# Patient Record
Sex: Female | Born: 1945 | Race: Black or African American | Hispanic: No | Marital: Married | Smoking: Current every day smoker
Health system: Southern US, Community
[De-identification: ages and names within clinical notes are randomized; demographics above are authoritative.]

## PROBLEM LIST (undated history)

## (undated) DIAGNOSIS — I10 Essential (primary) hypertension: Secondary | ICD-10-CM

## (undated) DIAGNOSIS — I639 Cerebral infarction, unspecified: Secondary | ICD-10-CM

## (undated) DIAGNOSIS — K922 Gastrointestinal hemorrhage, unspecified: Secondary | ICD-10-CM

## (undated) DIAGNOSIS — R4701 Aphasia: Secondary | ICD-10-CM

## (undated) DIAGNOSIS — R569 Unspecified convulsions: Secondary | ICD-10-CM

## (undated) DIAGNOSIS — E079 Disorder of thyroid, unspecified: Secondary | ICD-10-CM

## (undated) HISTORY — PX: ABDOMINAL HYSTERECTOMY: SHX81

## (undated) HISTORY — PX: PEG TUBE REMOVAL: SHX2187

## (undated) HISTORY — PX: PEG TUBE PLACEMENT: SUR1034

---

## 2000-11-05 ENCOUNTER — Inpatient Hospital Stay (HOSPITAL_COMMUNITY): Admission: EM | Admit: 2000-11-05 | Discharge: 2000-11-12 | Payer: Self-pay | Admitting: Emergency Medicine

## 2000-11-12 ENCOUNTER — Inpatient Hospital Stay (HOSPITAL_COMMUNITY)
Admission: RE | Admit: 2000-11-12 | Discharge: 2000-11-21 | Payer: Self-pay | Admitting: Physical Medicine & Rehabilitation

## 2000-11-23 ENCOUNTER — Encounter (HOSPITAL_COMMUNITY)
Admission: RE | Admit: 2000-11-23 | Discharge: 2000-12-23 | Payer: Self-pay | Admitting: Physical Medicine & Rehabilitation

## 2001-01-18 ENCOUNTER — Encounter: Admission: RE | Admit: 2001-01-18 | Discharge: 2001-01-18 | Payer: Self-pay | Admitting: Neurosurgery

## 2001-09-20 ENCOUNTER — Encounter: Payer: Self-pay | Admitting: Family Medicine

## 2001-09-20 ENCOUNTER — Ambulatory Visit (HOSPITAL_COMMUNITY): Admission: RE | Admit: 2001-09-20 | Discharge: 2001-09-20 | Payer: Self-pay | Admitting: Family Medicine

## 2001-10-15 ENCOUNTER — Emergency Department (HOSPITAL_COMMUNITY): Admission: EM | Admit: 2001-10-15 | Discharge: 2001-10-15 | Payer: Self-pay | Admitting: Emergency Medicine

## 2001-11-30 ENCOUNTER — Encounter: Payer: Self-pay | Admitting: Internal Medicine

## 2001-11-30 ENCOUNTER — Inpatient Hospital Stay (HOSPITAL_COMMUNITY): Admission: EM | Admit: 2001-11-30 | Discharge: 2001-12-06 | Payer: Self-pay | Admitting: Internal Medicine

## 2001-12-30 ENCOUNTER — Emergency Department (HOSPITAL_COMMUNITY): Admission: EM | Admit: 2001-12-30 | Discharge: 2001-12-30 | Payer: Self-pay | Admitting: Emergency Medicine

## 2002-04-09 ENCOUNTER — Emergency Department (HOSPITAL_COMMUNITY): Admission: EM | Admit: 2002-04-09 | Discharge: 2002-04-09 | Payer: Self-pay | Admitting: Emergency Medicine

## 2002-12-07 ENCOUNTER — Encounter: Payer: Self-pay | Admitting: Emergency Medicine

## 2002-12-07 ENCOUNTER — Emergency Department (HOSPITAL_COMMUNITY): Admission: EM | Admit: 2002-12-07 | Discharge: 2002-12-07 | Payer: Self-pay | Admitting: Emergency Medicine

## 2003-06-18 ENCOUNTER — Inpatient Hospital Stay (HOSPITAL_COMMUNITY): Admission: EM | Admit: 2003-06-18 | Discharge: 2003-06-20 | Payer: Self-pay | Admitting: *Deleted

## 2003-07-21 ENCOUNTER — Emergency Department (HOSPITAL_COMMUNITY): Admission: EM | Admit: 2003-07-21 | Discharge: 2003-07-22 | Payer: Self-pay | Admitting: Internal Medicine

## 2003-08-18 ENCOUNTER — Emergency Department (HOSPITAL_COMMUNITY): Admission: EM | Admit: 2003-08-18 | Discharge: 2003-08-19 | Payer: Self-pay | Admitting: *Deleted

## 2004-01-12 ENCOUNTER — Emergency Department (HOSPITAL_COMMUNITY): Admission: EM | Admit: 2004-01-12 | Discharge: 2004-01-12 | Payer: Self-pay | Admitting: Emergency Medicine

## 2004-03-04 ENCOUNTER — Emergency Department (HOSPITAL_COMMUNITY): Admission: EM | Admit: 2004-03-04 | Discharge: 2004-03-04 | Payer: Self-pay | Admitting: Emergency Medicine

## 2004-03-04 ENCOUNTER — Inpatient Hospital Stay (HOSPITAL_COMMUNITY): Admission: AD | Admit: 2004-03-04 | Discharge: 2004-03-21 | Payer: Self-pay | Admitting: Neurological Surgery

## 2004-03-25 ENCOUNTER — Inpatient Hospital Stay: Admission: AD | Admit: 2004-03-25 | Discharge: 2004-05-07 | Payer: Self-pay | Admitting: Internal Medicine

## 2004-03-27 ENCOUNTER — Emergency Department (HOSPITAL_COMMUNITY): Admission: EM | Admit: 2004-03-27 | Discharge: 2004-03-27 | Payer: Self-pay | Admitting: Emergency Medicine

## 2004-04-06 ENCOUNTER — Ambulatory Visit (HOSPITAL_COMMUNITY): Admission: RE | Admit: 2004-04-06 | Discharge: 2004-04-06 | Payer: Self-pay | Admitting: Internal Medicine

## 2004-04-21 ENCOUNTER — Ambulatory Visit (HOSPITAL_COMMUNITY): Admission: RE | Admit: 2004-04-21 | Discharge: 2004-04-21 | Payer: Self-pay | Admitting: Internal Medicine

## 2004-04-27 ENCOUNTER — Encounter (HOSPITAL_COMMUNITY): Admission: RE | Admit: 2004-04-27 | Discharge: 2004-05-06 | Payer: Self-pay | Admitting: Orthopedic Surgery

## 2004-04-29 ENCOUNTER — Ambulatory Visit (HOSPITAL_COMMUNITY): Admission: RE | Admit: 2004-04-29 | Discharge: 2004-04-29 | Payer: Self-pay | Admitting: Orthopedic Surgery

## 2004-05-07 ENCOUNTER — Inpatient Hospital Stay: Admission: AD | Admit: 2004-05-07 | Discharge: 2005-03-06 | Payer: Self-pay | Admitting: Internal Medicine

## 2004-05-10 ENCOUNTER — Ambulatory Visit (HOSPITAL_COMMUNITY): Admission: RE | Admit: 2004-05-10 | Discharge: 2004-05-10 | Payer: Self-pay | Admitting: Orthopedic Surgery

## 2004-05-18 ENCOUNTER — Ambulatory Visit (HOSPITAL_COMMUNITY): Admission: RE | Admit: 2004-05-18 | Discharge: 2004-05-18 | Payer: Self-pay | Admitting: Internal Medicine

## 2004-06-14 ENCOUNTER — Ambulatory Visit (HOSPITAL_COMMUNITY): Admission: RE | Admit: 2004-06-14 | Discharge: 2004-06-14 | Payer: Self-pay | Admitting: Internal Medicine

## 2004-06-16 ENCOUNTER — Ambulatory Visit: Payer: Self-pay | Admitting: Internal Medicine

## 2004-06-16 ENCOUNTER — Ambulatory Visit (HOSPITAL_COMMUNITY): Admission: RE | Admit: 2004-06-16 | Discharge: 2004-06-16 | Payer: Self-pay | Admitting: Internal Medicine

## 2005-09-11 ENCOUNTER — Ambulatory Visit (HOSPITAL_COMMUNITY): Admission: RE | Admit: 2005-09-11 | Discharge: 2005-09-11 | Payer: Self-pay | Admitting: Family Medicine

## 2006-09-12 ENCOUNTER — Ambulatory Visit (HOSPITAL_COMMUNITY): Admission: RE | Admit: 2006-09-12 | Discharge: 2006-09-12 | Payer: Self-pay | Admitting: Family Medicine

## 2006-09-24 ENCOUNTER — Emergency Department (HOSPITAL_COMMUNITY): Admission: EM | Admit: 2006-09-24 | Discharge: 2006-09-25 | Payer: Self-pay | Admitting: Emergency Medicine

## 2007-09-27 ENCOUNTER — Ambulatory Visit (HOSPITAL_COMMUNITY): Admission: RE | Admit: 2007-09-27 | Discharge: 2007-09-27 | Payer: Self-pay | Admitting: Family Medicine

## 2010-12-23 NOTE — Group Therapy Note (Signed)
NAMEKEGAN, [name]              ACCOUNT NO.:  [id number]   MEDICAL RECORD NO.:  [id number]          PATIENT TYPE:  ORB   LOCATION:  S101                          FACILITY:  [facility]   PHYSICIAN:  Hanley Hays. Dechurch, M.D.DATE OF BIRTH:  Jun 11, 1946   DATE OF PROCEDURE:  DATE OF DISCHARGE:  06/16/2004                                   PROGRESS NOTE   HISTORY OF PRESENT ILLNESS:  [name] overall clinically is doing well.  She  is up going about the facility.  She is dressed in street clothes.  She is  cooperative and overall making good progress.  She still has occasional  confusion and needs direction as far as her ADLs.  She still does not have  much in the way of safety awareness.  Apparently the family is considering  discharge to either an apartment setting or living with a family member.  It  is clear that this patient can not live independently.  She needs 24/7  assistance given her multiple medical issues.  I doubt she could be  compliant with the current regimen successfully unless supervised.  Apparently there are some social issues regarding the patient's husband and  alleged substance abuse and certainly this is a concern as well.  Unless  full time care was available for this patient, she is unlikely to be  successful in the home setting.      FED/MEDQ  D:  08/24/2004  T:  08/24/2004  Job:  [id number]

## 2010-12-23 NOTE — Group Therapy Note (Signed)
   [name], [name] NO.:  [id number]   MEDICAL RECORD NO.:  [id number]                  PATIENT TYPE:   LOCATION:                                       FACILITY:   PHYSICIAN:  Mila Homer. Sudie Bailey, M.D.           DATE OF BIRTH:   DATE OF PROCEDURE:  06/18/2003  DATE OF DISCHARGE:                                   PROGRESS NOTE   SUBJECTIVE:  The patient was admitted to the hospital yesterday, postictal  after partial complex seizures.  She was started on phenobarbital in an  attempt to control the seizures and also allow her to sleep last night, but  long term with attempt to have on a seizure medicine she could actually  afford. She was started on potassium for hyperkalemia.   Today, as I asked her about symptoms she really is unable to respond in any  meaningful way.   OBJECTIVE:  Temperature 98.2, pulse 94, respiratory rate 20, blood pressure  160/95.  She is confused.  She does not know she is in the hospital, but she  does not know where she is as far as town or state.  She does not know the  date.  She is wandering around the room pulling at her Foley.  Heart has a  regular rhythm with a rate of 70.  Lungs clear throughout.  Pupils appear  equal.  There is no edema of the ankles.   ASSESSMENT:  1. Probable alcohol withdrawal syndrome.  2. Patient is postictal from a seizure probably due to low Dilantin levels.  3. Essential hypertension.  4. Hypokalemia.  5. 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  6. Seizure disorder x1 year.   PLAN:  The patient was full admit to the hospital.  We will continue to  treat her hypokalemia and watch for further seizures.  Will keep her on  phenobarbital and discharge when her confusion clears.      ___________________________________________                                            Mila Homer Sudie Bailey, M.D.   SDK/MEDQ  D:  06/18/2003  T:   06/18/2003  Job:  [id number]

## 2010-12-23 NOTE — Consult Note (Signed)
[facility]  Patient:    [name], [name] Visit Number: [id number] MRN: [id number]          Service Type: MED Location: ICCU OZ36 64 Attending Physician:  John Giovanni Dictated by:   Beryle Beams, M.D. Admit Date:  11/30/2001                            Consultation Report  IMPRESSION: 1. Acute onset seizures. 2. Aphasia/acute confusional state possibly related to seizures, possible new    left hemispheric cerebrovascular accident. 3. Left hemispheric lucency, this is an old finding; however, may indicate an    old infarct. 4. Alcohol dependence.  RECOMMENDATIONS:  Agree with current treatment with Dilantin.  I would also obtain an MRI of the brain and an EEG.  HISTORY OF PRESENT ILLNESS:  This is a 65 year old African-American lady who has a long-standing history of alcoholism.  She also has a history of hypertension and developed acute confusional state on the morning of presentation.  Also had generalized weakness.  Presented to the emergency room with the blood pressure of 200/112.  She was admitted for further evaluation. She was noted to have a generalized tonic/clonic seizure on the floor and was, as mentioned, subsequently transferred to the unit.  She was subsequently Ativan as well as Dilantin.  She remains confused; however.  PAST MEDICAL HISTORY:  Hypertension, alcoholism.  ADMISSION MEDICATIONS:  Hydrochlorothiazide, Altace.  ALLERGIES:  CODEINE.  REVIEW OF SYSTEMS:  Review of systems are limited due to confusion.  PHYSICAL EXAMINATION:  VITAL SIGNS:  T-max 100, blood pressure 136/99, pulse 99, respirations 20.  NEUROLOGICAL:  The patient is awake, alert; however, she is grossly confused; and has severe problems with comprehension of language, comprehension of commands.  She has nonsensical speech which is not relevant to conversations at present.  Her speech is comprehensible.  No dysarthria noted. Had significant  problems with naming and following commands due to marked problems with comprehension.  The pupils are 5-mm and reactive.  The visual fields appear to be full, once again, these are limited to severe confusion. Extraocular movements are full.  Facial muscle strength is normal.  Tongue is midline.  She does have evidence of oral trauma to the tongue bilaterally. Moves all 4 extremities.  No drift noted.  Reflexes brisk 2, toes are both downgoing, responds to pain bilaterally equally.  LABORATORY DATA:  CT scan shows a left lower density in the left parieto-occipital region.  This was seen on previous CT.  Dilantin level done after a load was 21.  WBC 11, hemoglobin 11.3, platelets of 175.  CPK is 247. Sodium 130, potassium 3.5, chloride 95, CO2 19, glucose 201.  BUN 15, creatinine 0.9, calcium 8.4.  Thank you for this consultation. Dictated by:   Beryle Beams, M.D. Attending Physician:  John Giovanni DD:  12/01/01 TD:  12/02/01 Job: [id number]

## 2010-12-23 NOTE — Group Therapy Note (Signed)
[facility]  Patient:    [name], [name] Visit Number: [id number] MRN: [id number]          Service Type: MED Location: 2A A201 01 Attending Physician:  Milana Obey Dictated by:   Butch Penny, M.D. Admit Date:  11/30/2001                               Progress Note  SUBJECTIVE:  This patient was admitted with change in mental status.  She had been hypertensive.  Apparently, was seen by neurologist and this was felt to be alcohol withdrawal syndrome.  ______ seizures and remote cerebral bleed. Her condition remains stable.  OBJECTIVE:  VITAL SIGNS:  Blood pressure 133/91, respirations 20, pulse 20, temperature 97.1.  LUNGS:  Clear to P&A.  HEART:  Regular rhythm.  ABDOMEN:  No palpable organs or masses.  ASSESSMENT:  Patient was admitted with change in mental status, questionable hypertensive encephalopathy, alcohol withdrawal syndrome.  PLAN:  Continue current regimen. Dictated by:   Butch Penny, M.D. Attending Physician:  Milana Obey DD:  12/05/01 TD:  12/06/01 Job: [id number]

## 2010-12-23 NOTE — Op Note (Signed)
NAMERYLANN, MUNFORD              ACCOUNT NO.:  [id number]   MEDICAL RECORD NO.:  [id number]          PATIENT TYPE:  AMB   LOCATION:  DAY                           FACILITY:  [facility]   PHYSICIAN:  R. Roetta Sessions, M.D. DATE OF BIRTH:  Oct 08, 1945   DATE OF PROCEDURE:  05/18/2004  DATE OF DISCHARGE:                                 OPERATIVE REPORT   PROCEDURE:  PEG tube change.   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  Apparently a couple  of weeks later, she had to have it changed in the emergency department for  reasons not clear to me.  A 16-French balloon-type bumper was used to  replace the PEG tube.  Her dysphagia has improved considerably where she is  now taking some p.o.  The PEG tube has been leaking recently and I was asked  by Dr. Josefine Class to change it out.  She is not ready yet for a complete PEG  removal.  I discussed this approach with the family members.   DESCRIPTION OF PROCEDURE:  Examination of the PEG site revealed an area of  granulation tissue and a 16-French balloon-type, inflatable PEG tube in  place.  I had some difficulty in deflating the balloon with a syringe and  only got 1-2 mL of fluid back and the PEG tube would not pull out.  I spent  some time and kept negative pressure with the syringe plunger to try to get  additional fluid.  Only a couple of drops came out.  I ran an 18-gauge, 1-  1/2 inch needle along the shaft of the PEG tube in hopes I could pop the  balloon.  I continued to keep negative pressure on the syringe.  Eventually  the PEG tube was removed.  Subsequently a 20-French Bard obturator PEG tube  was obtained and via the obturator technique the internal muscular bumper  was advanced through the ostomy back into the gastric cavity.  There was  immediate flow of gastric contents  after this maneuver was done.  There was  excellent auscultation of insufflated air and small area of granulation  tissue noted above was cauterized with silver nitrate.  The external bumper  was anchored at the 4 cm mark.  A Gastrografin tube study will be obtained  to confirm tube position.   The patient tolerated the procedure very well.   IMPRESSION:  Status post removal of previously placed 16-French balloon-type  percutaneous endoscopic gastrostomy tube with replacement of a 24-French  Bard mushroom internal bumper.  Gastrografin tube study pending.     Otelia Sergeant   RMR/MEDQ  D:  05/18/2004  T:  05/18/2004  Job:  [id number]   cc:   Hanley Hays. Dechurch, M.D.  [address]  [place]  [place] [address]  Fax: [phone]

## 2010-12-23 NOTE — H&P (Signed)
[name], [name]                        ACCOUNT NO.:  [id number]   MEDICAL RECORD NO.:  [id number]                   PATIENT TYPE:  OBV   LOCATION:  A314                                 FACILITY:  [facility]   PHYSICIAN:  Mila Homer. Sudie Bailey, M.D.           DATE OF BIRTH:  01/03/1946   DATE OF ADMISSION:  06/17/2003  DATE OF DISCHARGE:                                HISTORY & PHYSICAL   HISTORY:  A 65 year old woman who presented to the ER with confusion.  She  has a history of a hemorrhagic left MCA in spring 2002.  She also has a long  history of alcoholism.  She developed seizures about a year ago and has been  on Dilantin for these.  She is being followed by Dr. Beryle Beams,  neurologist.  She had been tried on Keppra and apparently had been taking  Dilantin, however, due to change in her domicile, she might not have been  taking this.  She is __________ and was seen in our office on May 22, 2003.  At that point, she had run out of her antihypertensives.  The  medications she was due to be on included Enalapril 10 mg daily with HCTZ 50  mg daily and K-Dur 20 mEq daily, as well as Dilantin 100 mg t.i.d., and  lorazepam 1 mg p.r.n.  At that time, it was noted that blood tests done  summer 2004, showed elevation of SGOT to 262, SGPT to 87, alkaline  phosphatase 137, consistent with alcoholic hepatitis.  The patient told me  after this, she had really cut back her alcohol consumption to two drinks a  month.   She had been on Keppra, but could not afford it, so she took Dilantin, but  probably was not even taking this as she should have.  At that time, she was  put on Phenytek 200 mg and Atacand 16 mg 2 daily for blood pressure.   Initial exam in the ER showed normal vital signs.  The patient was confused.  She did not know where she was or the date.  The heart had a regular rhythm  at the rate of 70.  The lungs were clear throughout.  There was no axillary  or  supraclavicular adenopathy.  The abdomen was soft without  hepatosplenomegaly or mass.  There was no edema of the ankles.   Blood work showed a H&H 11.6 and 74.9, MCV of 1 and 1.2.  Her B12 level was  622.  The TSH was 5.7.  MET-7 showed a potassium of 3.0 on admission.  Phenytoin level was 7.2.  Urine showed specific gravity greater than 30 with  40 ketones.   Her brain CT showed no acute process.   ADMISSION DIAGNOSES:  1. Seizure disorder, now the patient is postictal.  2. The patient is status post a left middle cerebral artery hemorrhagic     stroke in spring  2002.  3. History of chronic alcoholism.  4. Essential hypertension.  5. Hypothyroidism.  6. Hypokalemia.    PLAN:  The plan of treatment is to admit her to the hospital, follow up with  Dr. Gerilyn Pilgrim, neurologist.  I discussed her case with him at length, over  about 20 minutes.  Given her problem with finances, we felt as long as she  would stay off alcohol, we could try her on phenobarbital, which would  probably control seizures better.  She had her blood pressure controlled and  potassium replacement.     ___________________________________________                                         Mila Homer. Sudie Bailey, M.D.   SDK/MEDQ  D:  06/18/2003  T:  06/18/2003  Job:  [id number]

## 2010-12-23 NOTE — Discharge Summary (Signed)
[facility]  Patient:    [name], [name] Visit Number: [id number] MRN: [id number]          Service Type: MED Location: 2A A201 01 Attending Physician:  John Giovanni Dictated by:   John Giovanni, M.D. Admit Date:  11/30/2001 Discharge Date: 12/06/2001                             Discharge Summary  HISTORY OF PRESENT ILLNESS:  This 65 year old presented to the hospital with change in mental status. She complained of dizziness and numbness in the right side and the family said she was confused. She had been drinking heavily again for the two weeks prior to admission.  HOSPITAL COURSE:  She had a seven day hospitalization extending from November 30, 2001 to Dec 06, 2001. Her vitals remained stable. She spent five days in ICU and then was transferred to the floor. She was seen by Dr. Gerilyn Pilgrim, neurologist, while in the hospital.  Admission white cell count 10,600 with a normal differential. The H&H 11.1/32.8. H&H remained stable and white cell count actually dropped to 7100. Admission sodium was 133, glucose 201, and her second day the sodium was 126, potassium 3.0, chloride 87, glucose 131. The following day the sodium was 127, potassium 2.7, chloride 87, glucose 164, and the day after that sodium 132, potassium 2.7, chloride 95, but the sugar is denote back to normal of 110, and by December 04, 2001 electrolytes were normal. Admission CK was 247 but the MB was 1.6, troponin 0.01.  Her phenytoin level was 21.3 on April 27th and 13 on December 02, 2001. Blood cultures x2 showed no growth at five days. Urine culture showed no growth at a day.  Admission chest x-ray was essentially normal except for borderline cardiac size. A CT of the head without contrast showed "there was an area of hypodensity seen within the superior left parietal lobe consistent with an area of encephalomalacia related to the patients previous hemorrhage. There was also an area of  remote lacunar infarction seen within the left external capsular region." There are no acute abnormalities.  EKG shows sinus tachycardia rate 107.  She was initially admitted to the floor and then transferred to the unit. She was very confused initially. She had generalized tonic-clonic seizure on the floor, at that point was transferred to the unit.  Treatment consisted of Catapres 0.1 mg p.o. q.6 h. unless the systolic BP was less than 150. She was also put on Levaquin 500 mg p.o. q.d., had neural checks q.4 h. x24 hours. Cath urine was done.  After her tonic-clonic seizure she was put on Ativan 1 mg IV q.4 h. with Ativan 1 mg IV every hour p.r.n., as well as MVA-ampule q.24 h., thiamine 100 mg IV q.24 h. x3 days, and Cerebyx 1 g IV to start then 100 mg IV q.8 h.  She eventually required Ativan IV even every 30 minutes for a few days, was using as much as 20-30 mg of Ativan a day to control her agitation. She was put on nicotine patch.  A brain MRI was attempted but despite IV Ativan she moved too much for this to be useful. She was given EC-ASA 325 mg q.d. and KCl 20 mEq b.i.d.  She did well on this regimen, gradually improving. She became less confused, was ready to be transferred from the unit to the floor. She did well there and Foley was able  to be removed, IVs stopped, was back to regular diet, ready for discharge home.  FINAL DISCHARGE DIAGNOSES: 1. Probable alcoholic withdrawal seizure. 2. Status post cerebral hemorrhage. 3. Status post lacunar infarct probably related to hypertension. 4. Essential hypertension. 5. Ethyl alcoholism. 6. Electrolyte abnormalities.  DISCHARGE MEDICATIONS: 1. Levaquin 500 mg q.d. for 5 days (dispense #5 no refills). 2. Digitek 300 mg q.d. (dispense #30 no refills). 3. Lorazepam 2 mg t.i.d. (dispense #30 no refills). 4. To continue with her antihypertensive medication at home.  FOLLOWUP:  She is to see [place] back in the office in 5 days  and bring all medicines at that time. Will consider getting a brain MRI at that time to compare with her MRI from last year. Dictated by:   John Giovanni, M.D. Attending Physician:  John Giovanni DD:  12/06/01 TD:  12/08/01 Job: [id number]

## 2010-12-23 NOTE — Group Therapy Note (Signed)
[facility]  Patient:    [name], [name] Visit Number: [id number] MRN: [id number]          Service Type: MED Location: ICCU AO13 08 Attending Physician:  John Giovanni Dictated by:   John Giovanni, M.D. Admit Date:  11/30/2001                               Progress Note  SUBJECTIVE:  The patient is still somewhat drowsy after receiving a lot of IV Ativan last night.  She is able to say a few things, though, and is cooperative.  Speech is a little bit slurred.  OBJECTIVE:  VITAL SIGNS:  Temperature 98 degrees, pulse 101, respiratory rate 22, blood pressure 135/93.  HEART:  Regular rhythm without murmur, rate of about 90.  LUNGS:  Clear throughout, moving air well.  SKIN:  Turgor is normal.  HEENT:  Mucous membranes are moist.  ABDOMEN:  Soft without hepatosplenomegaly or mass.  No abdominal tenderness.  EXTREMITIES:  No edema of the ankles.  ASSESSMENT: 1. Alcohol withdrawal syndrome. 2. Status post cerebral bleed.  PLAN:  Continue with the IV fluids, Ativan, and if we reached a point where we are using minimal Ativan and she still seems to have some neurologic sequela, would again attempt a brain MRI. Dictated by:   John Giovanni, M.D. Attending Physician:  John Giovanni DD:  12/03/01 TD:  12/03/01 Job: [id number]

## 2010-12-23 NOTE — Procedures (Signed)
NAMELAGINA, READER                        ACCOUNT NO.:  [id number]   MEDICAL RECORD NO.:  [id number]                   PATIENT TYPE:  INP   LOCATION:  A202                                 FACILITY:  [facility]   PHYSICIAN:  Kofi A. Gerilyn Pilgrim, M.D.              DATE OF BIRTH:  1945-10-16   DATE OF PROCEDURE:  DATE OF DISCHARGE:  06/20/2003                                EEG INTERPRETATION   HISTORY:  This is an 65 year old lady who has a history of seizures.  She  presented to the emergency room with a seizure and confusion afterwards.   ANALYSIS:  This is a 16-channel recording.  It is conducted for  approximately 20 minutes.  There is a posterior rhythm in the beta range of  17-20 Hz with a low electrocortical voltage activity.  Awake activity is  seen.  There is no focal slowing, lateralized slowing, or epileptiform  activity.  Photic stimulation did not elicit any abnormal responses.   IMPRESSION:  This is a normal recording of the awake and drowsy states.      ___________________________________________                                            Perlie Gold Gerilyn Pilgrim, M.D.   KAD/MEDQ  D:  06/25/2003  T:  06/25/2003  Job:  [id number]

## 2010-12-23 NOTE — Group Therapy Note (Signed)
NAMELUE, SYKORA              ACCOUNT NO.:  [id number]   MEDICAL RECORD NO.:  [id number]          PATIENT TYPE:  ORB   LOCATION:  S101                          FACILITY:  [facility]   PHYSICIAN:  Hanley Hays. Dechurch, M.D.DATE OF BIRTH:  Aug 26, 1945   DATE OF PROCEDURE:  07/25/2004  DATE OF DISCHARGE:                                   PROGRESS NOTE   SUBJECTIVE:  Ms. Lesesne has remained clinically stable, though her family  notes that she is much more tearful at times, though today she seems to be  comfortable without any complaints.  Staff has noted she will get anxious.  She has been eating well.  Her weight is increased.  She is tolerating  without her G-tube fine.  She is alert.  She can answer some simple  questions, though not consistently.  She complains of some pain in her left  side, arms and legs.   PHYSICAL EXAMINATION:  NEUROLOGIC:  There is a left hemiparesis.  She can  answer simple questions, but is disoriented to place.  LUNGS:  Clear.  Respirations unlabored.  HEART:  Regular.  VITAL SIGNS:  Blood pressure 90 to 130's, somewhat variable.  EXTREMITIES:  Trace edema, sign of dependence, left side.  No significant  change.  SKIN:  No skin breakdown is reported.   MEDICATIONS:  1.  Ativan 1 mg q.4h. p.r.n. anxiety.  2.  Ultram 25 mg t.i.d.  3.  Nicotine 14 mg daily.  4.  Lunesta 2 mg p.r.n. q.h.s.  5.  Synthroid 25 mcg weekly.  6.  Prevacid 30 mg daily.  7.  Lexapro 10 mg daily.  8.  Norvasc 5 mg daily.  9.  Prenatal vitamin daily.  10. Lotensin 10 mg b.i.d.  11. labetalol 200 mg b.i.d.  12. Phenobarbital 60 mg b.i.d.  13. Ativan 1 mg b.i.d.  14. Lactulose 30 cc p.r.n.   LABORATORY DATA:  Hemoglobin 10.  BMP normal.  Phenobarbital 20, on June 10, 2004.   ASSESSMENT AND PLAN:  1.  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  2.  Depression/anxiety disorder.  We will increase  her Lexapro and use her      Ativan t.i.d. for the next little bit, and then hopefully discontinue.  3.  Hypertension.  Some low blood pressures have been documented.  We will      check orthostatics.  4.  Pain, status post cerebrovascular accident.  Seems to be relatively well      managed with her current dose of Ultram.  5.  Nicotine addiction.  She has been on the 14 mg for quite a while.  We      will go ahead and decrease it to  7 mg.  She does not appear to be smoking.   Discussed with her sister regarding her current status, particularly her  mental status.  Her daughter will call me if she does not see improvement or  she is worsening of her anxiety or tearfulness.     Drenda Freeze  FED/MEDQ  D:  07/25/2004  T:  07/26/2004  Job:  [id number]

## 2010-12-23 NOTE — Discharge Summary (Signed)
[place]  Patient:    [name], [name]                     MRN: [id number] Adm. Date:  [id number] Disc. Date: [id number] Attending:  Herold Harms                           Discharge Summary  ADMISSION DIAGNOSES: 1. Headache. 2. Left intracerebral hemorrhage.  DISCHARGE DIAGNOSES: 1. Left intracerebral hemorrhage. 2. Aphasia. 3. Right hemiplegia.  COMPLICATIONS:  None.  PROCEDURES: 1. Transesophageal echocardiogram showed left ventricular function of 55-65%. 2. Doppler of the carotid arteries showed moderate homogeneous plaque of the    common carotid artery and, on the left side, a mild calcific plaque.  No    evidence of ICA stenosis, with antegrade vertebral artery flow.  HISTORY OF PRESENT ILLNESS:  [name] was admitted by Dr. Tressie Stalker on November 05, 2000, for a left intracerebral hemorrhage.  She returned from work at 3 p.m. on the day before admission and felt sick.  A CT scan showed a left parieto-occipital intracerebral hemorrhage, and she was transferred to [facility].  Her initial examination showed her to be alert and oriented to person.  She had a Glasgow coma scale of 13.  She showed on motor exam a right facial droop and mild right hemiparesis.  CT scan showed a 3 x 3 cm left parieto-occipital intracerebral hemorrhage with mild surrounding edema without significant mass effect.  HOSPITAL COURSE:  The patient was admitted to the intensive care unit and watched conservatively.  She was then transferred out to the floor.  She was oriented to person only, was mildly lethargic, had a right-sided neglect. Speech was not fluent.  She was able to move her right side though she was mildly ______ .  She was transferred to the rehabilitation service for long-term care.  At discharge on examination she was alert and oriented to person, had a receptive aphasia.  She moved all extremities.  Pupils were equal,  round, and reactive to light.  She did still have somewhat of a right-sided neglect. DD:  02/22/01 TD:  02/25/01 Job: [id number] UXL/[id number]

## 2010-12-23 NOTE — Consult Note (Signed)
NAMEMALARY, [name]                        ACCOUNT NO.:  [id number]   MEDICAL RECORD NO.:  [id number]                   PATIENT TYPE:  INP   LOCATION:  3312                                 FACILITY:  [facility]   PHYSICIAN:  Bernette Redbird, M.D.                DATE OF BIRTH:  03-Mar-1946   DATE OF CONSULTATION:  03/16/2004  DATE OF DISCHARGE:                                   CONSULTATION   REASON FOR CONSULTATION:  The internal medicine teaching service asked me to  see this 65 year old alcoholic African-American female for possible PEG  placement.   Mrs. Canady sustained an intracerebral hemorrhage approximately 2 weeks ago  and since then has been in somewhat obtunded state, able to swallow small  amounts with some slight penetration and perhaps minimal aspiration of thin  liquids, but also able to eat small amounts of apple-sauce-consistency food.  She has been receiving Panda tube feedings and it is anticipated that due to  both dysphagia problems and also the patient's lack of engagement in the  eating process that her nutritional needs cannot be met at this time by the  oral route, so long-term nutritional access has been requested.  The house  staff have spoken with the patient's husband, [name], and obtained a  telephone consent to proceed with PEG placement.   PAST MEDICAL HISTORY:  This is obtained from the chart since the patient is  nonconversant.  Allergy to CODEINE (hives and swelling).   CURRENT MEDICATIONS:  IV Protonix, phenobarbital, Synthroid, Norvasc,  labetalol, Lotensin, and Jevity tube feeding as noted.   OPERATIONS:  Apparently, no prior abdominal surgery based on examining the  patient and reviewing the chart, but she is status post hysterectomy in  1989.   MEDICAL ILLNESSES:  Include a history of CVAs in 2003 and 2004 for which she  is on disability.  Also, history of alcohol dependence, hypertension,  possible seizure disorder, and apparently an  ongoing smoking history of two  packs per day.   FAMILY HISTORY:  Not obtained, not pertinent.   SOCIAL HISTORY:  Married and apparently was living with her husband prior to  admission.  She is on disability.   REVIEW OF SYSTEMS:  Not obtainable.   PHYSICAL EXAMINATION:  GENERAL:  The patient is arousable to voice and seems  to follow with her eyes but does not really follow commands.  She is  anicteric.  CHEST:  Has some right upper anterior expiratory wheezes.  HEART:  Rhythm and rate are normal and no gallops, rubs, or murmurs are  appreciated.  ABDOMEN:  Without obesity nor is it scaphoid, but rather has a flat contour  and no epigastric incisions or masses and in particular, I checked carefully  for any enlargement of the left lobe of the liver, splenomegaly, or  hepatomegaly in the right infracostal region but could not detect any  evidence of that despite  her history of alcohol abuse.  There is no obvious  ascites.   LABORATORY DATA:  White count 9300; hemoglobin 11.2; platelets 411,000.  Chemistry panel pertinent for normal liver chemistries on admission, normal  amylase and lipase, minimal elevation of ammonia level, low albumin of 3.2  at time of admission.  Normal cardiac enzymes.  Normal iron and B12 studies.  Normal INR of 1.1.   IMPRESSION:  I think that PEG placement in this patient is both medically  appropriate and technically feasible.  It appears that the patient's husband  is in favor of it as well.   PLAN:  I have tentatively scheduled the patient for PEG placement tomorrow  but have asked the patient's sister, [name], to have the husband call  me so that I can speak to him directly, review the procedure, make sure that  he is aware of the pertinent risks, and to have an opportunity to have  questions answered if needed.                                               Bernette Redbird, M.D.    RB/MEDQ  D:  03/16/2004  T:  03/16/2004  Job:  [id number]

## 2010-12-23 NOTE — Op Note (Signed)
[name], [name]                        ACCOUNT NO.:  [id number]   MEDICAL RECORD NO.:  [id number]                   PATIENT TYPE:  INP   LOCATION:  3312                                 FACILITY:  [facility]   PHYSICIAN:  Bernette Redbird, M.D.                DATE OF BIRTH:  1946-03-19   DATE OF PROCEDURE:  03/17/2004  DATE OF DISCHARGE:                                 OPERATIVE REPORT   PROCEDURE PERFORMED:  Percutaneous endoscopic gastrostomy placement.   ENDOSCOPIST:  Florencia Reasons, M.D.   INDICATIONS FOR PROCEDURE:  The patient is a 65 year old female on the  medicine teaching service as an unassigned patient, who has a history of an  intracerebral bleed resulting in obtundation and inability to take adequate  oral nutrition.   FINDINGS:  Uneventful placement of 25 French Wilson Cook traction removable  PEG tube.   DESCRIPTION OF PROCEDURE:  The nature, purpose and risks of the procedure  had been reviewed with the patient's husband by telephone; he had already  provided telephone consent to the internal medicine house staff following  their description of the procedure.   The patient was brought from her hospital room to the endoscopy unit.  Ancef  1 g IV was administered beginning just as the skin incision for the PEG tube  was made.  Versed 3 mg IV was given as sedation without arrhythmias but with  significant hypertension during much of the procedure (she was hypertensive  when she came down from her hospital room already).  The bite block was able  to be inserted with some difficulty.  The patient had severe gingivitis but  it is not felt that any damage to her teeth resulted from insertion of bite  block.  The Olympus video endoscope was passed under direct vision, entering  the esophagus alongside the Panda tube without significant difficulty.  The  vocal cords were not well seen.  There was a little bit of chronic distal  esophagitis characterized by some slight  mucosal irregularity but no frank  active esophagitis, no esophageal erosions or ulcerations, no evidence of  Barrett's esophagus, and, despite the patient's history of significant  ethanol consumption, no varices.  There was no evidence of neoplasia or  infection.  There was a widely patent esophageal ring at the squamocolumnar  junction and below this was a roughly 4 cm hiatal hernia.  The stomach  contained no residual and had normal mucosa without evidence of gastritis,  erosions, ulcers, polyps or masses.  Retroflex viewing showed no evidence of  gastric varices or other abnormalities.  The pylorus was normal, the  duodenal bulb had prominent hypertrophied Brunner's glands, and the second  duodenum looked normal.  A suitable site for PEG placement was identified by  clear finger indentation.  The abdominal wall had already been prepped but  was reprepped with Betadine, the skin was anesthetized by the nurse with  1%  plain lidocaine, a small cutaneous incision was made, and the 18 gauge  Seldinger needle was passed into the stomach on the first pass, after which  the stylet was removed and the guidewire introduced and withdrawn along with  scope out through the patient's mouth.  Thereafter, I slid the 24 [place]  Wilson Cook traction removable PEG tube over the wire and pushed it until  its leading edge could be pulled through the anterior abdominal wall,  whereupon it was pulled the remainder of the distance without significant  difficulty.  The patient was re-endoscoped without significant difficulty,  and appropriate positioning and tension for the internal bolster were  confirmed.  The scope was then removed from the patient.  The patient  tolerated the procedure well.  There were no apparent complications.   IMPRESSION:  1. Feeding problem, with uneventful placement of a 24 French percutaneous     endoscopic gastrostomy tube as described above (2841).  2. Probable chronic mild  distal esophagitis, presumably due to reflux.  3. Small hiatal hernia with Schatzki's ring.   PLAN:  The patient will be observed overnight for evidence of complications  with the intent of initiating tube feedings tomorrow if she is clinically  stable.                                               Bernette Redbird, M.D.    RB/MEDQ  D:  03/17/2004  T:  03/17/2004  Job:  [id number]

## 2010-12-23 NOTE — Group Therapy Note (Signed)
   [name], [name]                        ACCOUNT NO.:  [id number]   MEDICAL RECORD NO.:  [id number]                   PATIENT TYPE:  INP   LOCATION:  A202                                 FACILITY:  [facility]   PHYSICIAN:  Mila Homer. Sudie Bailey, M.D.           DATE OF BIRTH:  10-13-1945   DATE OF PROCEDURE:  06/19/2003  DATE OF DISCHARGE:                                   PROGRESS NOTE   SUBJECTIVE:  The patient is complaining of no pain or discomfort today.   OBJECTIVE:  She seems to be very confused.  Does not know where she is.  She  does not even know what the question about where she is means.  Does not  know the date, the time.  Temperature is 98.7, pulse 72, respiratory rate  20, blood pressure 164/103.  She is ambulating around the room.  She is in  no acute distress.  She is well-developed,well-nourished.  She obviously  finished breakfast and she has eaten everything.  Heart has a regular  rhythm, rate of about 90.  Lungs are clear throughout, moving air well.  There is no edema of the ankles.  Skin turgor is normal, mucous membranes  are moist.   ASSESSMENT:  1. Confusion, question etiology.  2. Partial complex seizure disorder.  3. Alcoholism.  4. Hypokalemia.  5. Essential hypertension.  6. Hypothyroidism   PLAN:  Continue with current medications of Keppra 500 mg b.i.d.,  levothyroxine 25 mcg daily, phenobarbital 60 mg q.h.s., KCl 20 mEq b.i.d.  Given her general stability I doubt she is within alcohol withdrawal  syndrome at present.  Will discuss with neurologist.      ___________________________________________                                            Mila Homer. Sudie Bailey, M.D.   SDK/MEDQ  D:  06/19/2003  T:  06/19/2003  Job:  [id number]

## 2010-12-23 NOTE — Group Therapy Note (Signed)
NAMECARLENA, [name]              ACCOUNT NO.:  [id number]   MEDICAL RECORD NO.:  [id number]           PATIENT TYPE:  ORB   LOCATION:  S101                          FACILITY:  [facility]   PHYSICIAN:  Hanley Hays. Dechurch, M.D.DATE OF BIRTH:  Feb 22, 1946   DATE OF PROCEDURE:  01/23/2005  DATE OF DISCHARGE:                                   PROGRESS NOTE   [name] is doing very well.  There have been no real behavior issues.  She  has been very cooperative.  Her p.o. intake has been good.  She appears to  be tolerating the decreased Ativan without difficulty. Occasionally she  becomes quite agitated, but usually in response to an issue.  Today she is  doing well; good sense of humor.  No new complaints.  She states her pain is  well-controlled.  The patient still takes some p.r.n. medication, but that  is fairly infrequent.  No GI or GU complaints.  Review of labs from April  reveal a mild increase in her ALT and AST.  A followup will be ordered next  week to ensure that it is not increasing.  She has had no new medications in  that time frame.  Current regimen is reviewed.   ASSESSMENT AND PLAN:  1.  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      She remains wheelchair independent, and otherwise stable.  2.  Hypertension, good control with current regimen of Lotensin and      Normodyne.  3.  Anxiety disorder.  She has tolerated the decreased dose of      benzodiazepine..  4.  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  We are going to see if we can further titrate her      benzodiazepine.  5.  Anemia.  Stable.  She will follow up after three months, along with her      CMP.       FED/MEDQ  D:  01/23/2005  T:  01/23/2005  Job:  [id number]

## 2010-12-23 NOTE — Discharge Summary (Signed)
[name], [name]                        ACCOUNT NO.:  [id number]   MEDICAL RECORD NO.:  [id number]                   PATIENT TYPE:  INP   LOCATION:  A202                                 FACILITY:  [facility]   PHYSICIAN:  Mila Homer. Sudie Bailey, M.D.           DATE OF BIRTH:  [birth year]   DATE OF ADMISSION:  06/17/2003  DATE OF DISCHARGE:                                 DISCHARGE SUMMARY   OBJECTIVE:  This 65 year old woman presented to the hospital confused on  June 17, 2003.  She has had a 4-day hospital course extending from  November 10, to June 20, 2003.   In the hospital she was seen by Dr. Beryle Beams, neurologist.   Her blood work showed a white cell count of 8700, H&H 11.6/4.9, MCV of 101.  MET-7 showed a potassium of 3.0 and a glucose of 100.  Her alcohol level was  6 (normal 0-10).  Her drug screen showed no detected benzodiazepines,  cocaine, amphetamines, cannabinoids, opiates or barbiturates.  Her vitamin B-  12 level was 622.  TSH was 576.  UA showed specific gravity greater than  1030 with 40 ketones.  Admission Dilantin level was 7.2, and after receiving  Dilantin, in the hospital, it was 18.5.  Her admission CT showed an old  infarct in the left external capsule and in the left high posterior parietal  area.  There were no acute intracranial abnormalities.   TREATMENT IN THE HOSPITAL:  We did vitals every 4 hours, p.r.n. Tylenol and  a Foley catheter initially. She received Dilantin 500 mg IV x1 dose.  She  was put on phenobarbital 60 mg q.h.s. and received KCl 20 mEq b.i.d. She was  started on Keppra 5 mg b.i.d. on November 11; and after a day of observation  was put on full admission on June 18, 2003.  Synthroid 25 mcg daily was  added and Vasotec 10 mg daily the following day for hypertension.   She stayed confused for several days, but by her fourth day she is doing  much better.  She knew that she was [facility] and she is able to  tell me many things about the change in living situation; and overall she is  still slightly confused but much improved and certainly has reached maximum  benefit of being in the hospital.   She is discharged home on phenobarbital 60 mg daily (50 with a refill), to  continue on her Phenytek 200 mg daily and her Synthroid 25 mcg daily (30  with a refill).  Plan is to see her in the office in 4 days and reevaluate  her blood pressure at that time and if she is doing well with the  phenobarbital, increase the dose based upon her phenobarbital level.   FINAL DISCHARGE DIAGNOSES:  1. Confusion secondary to a seizure.  2. Seizure disorder.  3. Old cerebrovascular accidents (she had an old lacunar  infarct of the left     external capsule) and an old cortical infarct high posterior left     parietal region.  4. She is status post left middle cerebral artery hemorrhagic stroke in the     Spring of 2002.  5.     History of alcoholism.  6. Hypokalemia.  7. Hypothyroidism.     ___________________________________________                                         Mila Homer Sudie Bailey, M.D.   SDK/MEDQ  D:  06/20/2003  T:  06/20/2003  Job:  [id number]

## 2010-12-23 NOTE — Group Therapy Note (Signed)
NAMEMARTAVIA, TYE              ACCOUNT NO.:  [id number]   MEDICAL RECORD NO.:  [id number]           PATIENT TYPE:  ORB   LOCATION:  S101                          FACILITY:  [facility]   PHYSICIAN:  Hanley Hays. Dechurch, M.D.DATE OF BIRTH:  [birth year]   DATE OF PROCEDURE:  11/07/2004  DATE OF DISCHARGE:                                   PROGRESS NOTE   Ms. Porta has medically stable. She has had no specific complaints.  Nursing  has noted that she is agitated at times, becomes very impatient and anxious.  She wants to go home but does not have the safety awareness nor the ability  to take care of herself.  Her sister occasionally is taking her out to smoke  which seems to make her want to smoke more.  She gets very frustrated if  things are not immediately available; i.e., hair dresser and rants down the  hall according to the staff.  She has not been combative.  She has not  really been confused per se.  She has limited insight into her medical  problems.  She denies any complaints and states she is doing fine.  She is  denying any pain.   Medical regimen is reviewed.  For the most part, the systolic blood pressure  has been under 110.  There was one 137/91 back and unclear what the issue  was at that time.  She denies any hypotension.  She has had no new falls or  other injuries.   PHYSICAL EXAMINATION:  GENERAL:  Well-developed female.  Weight has been  stable.  Appetite has been good. She is in no distress.  She can follow some  simple commands.  She has the left hemiparesis which is unchanged.  LUNGS:  Clear.  HEART:  Regular, no murmur.  ABDOMEN:  Soft.  Site is well healed.  EXTREMITIES:  Without edema.   ASSESSMENT AND PLAN:  1.  Status post cerebrovascular accident with left hemiparesis.  She has      actually done well with therapy and not expected to improve      significantly.  2.  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  Seems to be doing well on      the Ultram  which she is tolerating at this point, but I am not going to      decrease this.  3.  History of substance abuse and anxiety.  She has remained stable on the      current regimen.  Pharmacy has made the recommendation to attempt a      taper of her benzodiazepines which I suspect will fail.  We will go      ahead and try decreasing to 5 t.i.d. and monitor.  4.  Hypertension.  Good control.  At this point, I am going to decrease her      Lotensin and change it to Lotrel  to decrease her pill load.  Will      continue Normodyne as it is and monitor.  5.  Anemia.  Improving on the prenatal vitamins.  Will  follow up in a.m.      Will ask for followup of CMP as well.   PLAN:  Discharge at some point to an assisted living situation.  The patient  cannot be independent.  Her social support has been limited in the past.  She does have a very supportive sister who is employed outside the home.  I  doubt Ms. Varnell would be able to function without full-time care.  The risk  of returning to her previous lifestyle, of course, is an issue as well.  We  will continue to work with the family the best we can regarding discharge  issues.      FED/MEDQ  D:  11/07/2004  T:  11/07/2004  Job:  [id number]

## 2010-12-23 NOTE — Group Therapy Note (Signed)
NAMELIBBIE, BARTLEY              ACCOUNT NO.:  [id number]   MEDICAL RECORD NO.:  [id number]          PATIENT TYPE:  ORB   LOCATION:  S101                          FACILITY:  [facility]   PHYSICIAN:  Hanley Hays. Dechurch, M.D.DATE OF BIRTH:  1945-08-09   DATE OF PROCEDURE:  DATE OF DISCHARGE:                                   PROGRESS NOTE   SUBJECTIVE:  Ms. Braggs is preparing to go home.  She is going to be living  with her sister with full supervision.  She is ecstatic about discharge.  She has been doing very well without any new events since our last visit.  She has no complaints, good appetite, no bowel or bladder issues.  She is  continent.  She is able to walk independently and has had no falls.  There  have been no behavior issues.  She denies any anxiety.  She is sleeping  well.  Denies any pain.  She sometimes has some aching in her left side, but  that has not been a real issue.   MEDICATIONS:  1. Lotensin 10 mg b.i.d.  2. Labetalol 200 mg b.i.d.  Phenobarbital 60 mg b.i.d.  1. Ativan 0.5 mg b.i.d.  2. Prenatal vitamins daily.  3. Synthroid 25 mcg weekly.  4. Prevacid 30 mg daily.  5. Ultram 25 mg t.i.d.  6. Lotrel.  7. Normodyne 200 mg b.i.d.  8. Prilosec 20 mg daily.  9. Lexapro 10 daily.  10.Multivitamin daily.  11.Lactulose 30 cc p.r.n.   OBJECTIVE:  GENERAL:  Well-developed, well-nourished female.  She has gained  weight.  She is in no distress, pleasant, in good spirits.  She has some  mild residual left upper extremity greater than lower extremity weakness,  but is able to move all extremities x4 and stand up unassisted.  Her gait is  actually fairly intact.  She tends to lean a bit forward.  Balance is good  at rest.  She does not do well with the shove test, but is able to  compensate with the assistive device.  LUNGS:  Clear.  HEART:  Regular.  ABDOMEN:  Obese, soft, nontender.  EXTREMITIES:  Without clubbing or cyanosis.  She has no edema.    ASSESSMENT:  1. History of large right parietal intracranial hemorrhage with residual      global aphasia and left hemiparesis with marked improvement with      rehabilitation over the past year.  2. She has a remote history of a left parietal cerebrovascular accident.  3. History of substance abuse including illicit drugs and alcohol, of      course, none over the year she has been hospitalized.  4. History of hypertension, previously noncompliant.  5. History of seizure disorder well-controlled on phenobarbital.  6. History of anxiety disorder, remains stable on Lexapro and Ativan.  7. Hypothyroidism.  8. History of tobacco abuse, although has been abstinent for quite some      time.   PLAN:  The patient is being discharged to home.  She was followed by Dr.  Mila Homer. Sudie Bailey prior to her hospitalization  in July 2005.  She will be  under the care of her sister.  She is married and there is some social  issues as to whether she is going to be able to be managed.  In any event,  we will arrange for home social services, PT/OT and home health nursing  until she gets settled, although I suspect this will be short-lived.       FED/MEDQ  D:  03/03/2005  T:  03/03/2005  Job:  [id number]   cc:   Mila Homer. Sudie Bailey, M.D.  [address] [place], [place] [address]  Fax: [phone]

## 2010-12-23 NOTE — Discharge Summary (Signed)
[name], [name]                        ACCOUNT NO.:  [id number]   MEDICAL RECORD NO.:  [id number]                   PATIENT TYPE:  INP   LOCATION:  3028                                 FACILITY:  [facility]   PHYSICIAN:  Ileana Roup, M.D.               DATE OF BIRTH:  Apr 09, 1946   DATE OF ADMISSION:  03/04/2004  DATE OF DISCHARGE:                                 DISCHARGE SUMMARY   ADDENDUM:  Please refer to the previous discharge summary that was dictated  on Friday, March 18, 2004.  The only change to that is please check CBGs  with tube feeding.      Darrol Jump, MD                           Ileana Roup, M.D.    SD/MEDQ  D:  03/21/2004  T:  03/21/2004  Job:  [id number]

## 2010-12-23 NOTE — Procedures (Signed)
[facility]  Patient:    [name], [name] Visit Number: [id number] MRN: [id number]          Service Type: MED Location: 2A A201 01 Attending Physician:  John Giovanni Dictated by:   Beryle Beams, M.D. Proc. Date: 12/06/01 Admit Date:  11/30/2001 Discharge Date: 12/06/2001                            EEG Interpretations  INDICATIONS FOR PROCEDURE:  This is a lady who presented with altered mental status and reportedly had a seizure and continues to be confused.  There is no history of alcoholism for over three years. The patients age is 65.  ANALYSIS:  A 16-channel recording conducted for 20 minutes in a posterior rhythm in the beta range of 13 to 13-1/2 Hz. There is occasional beta alpha activity seen of 10 Hz. There is intermittent and occasional left hemispheric slowing of about 2-4 Hz. During stage II sleep, K complexes and sleep spindles were seen. This was brief; however, during stage II sleep, epileptiform spikes slow and discharges were seen in a generalized fashion. Throughout the recording, approximately six discharges were seen. Photic stimulation did not elicit any abnormal responses.  IMPRESSION:  This is an abnormal recording due to: 1. Generalized epileptiform discharges. 2. Intermittent left hemispheric slowing indicating possible    cerebrovascular disturbance and hemisphere clinical correlation is    suggested. Dictated by:   Beryle Beams, M.D. Attending Physician:  John Giovanni DD:  12/06/01 TD:  12/09/01 Job: [id number]

## 2010-12-23 NOTE — Group Therapy Note (Signed)
[facility]  Patient:    [name], SCHUSSLER Visit Number: [id number] MRN: [id number]          Service Type: MED Location: 2A A201 01 Attending Physician:  John Giovanni Dictated by:   John Giovanni, M.D. Admit Date:  11/30/2001                               Progress Note  SUBJECTIVE:  Still somewhat confused.  OBJECTIVE:  VITAL SIGNS:  Her weight today is 143 pounds, but admission weight 149, temperature 97.2, pulse 90, respiratory rate 24, blood pressure 149/108.  GENERAL:  She appears to be fairly alert and knows my name.  Knows it is her sister in the room with her, but does not give her name, at least readily.  NEUROLOGIC:  There is no slurring of her speech.  She seems to be groping for words.  HEART:  Regular rhythm, rate about 90.  LUNGS:  Clear throughout.  ABDOMEN:  Soft without tenderness, without organomegaly or mass.  EXTREMITIES:  No edema of the ankles.  LABORATORIES:  Todays MET-7 was essentially normal with potassium 3.7, BUN only 4.  H&H 10.6/31.0.  ASSESSMENT: 1. Alcohol withdrawal syndrome. 2. Chronic ethyl alcoholism. 3. Hypokalemia which is stable.  PLAN:  Ativan is gradually being tapered down.  She is out of the unit as of this morning, out on the floor.  Maybe I will cut back her IVs at this point. Dictated by:   John Giovanni, M.D. Attending Physician:  John Giovanni DD:  12/04/01 TD:  12/05/01 Job: [id number]

## 2010-12-23 NOTE — Group Therapy Note (Signed)
[facility]  Patient:    [name], [name] Visit Number: [id number] MRN: [id number]          Service Type: MED Location: ICCU RJ18 84 Attending Physician:  Milana Obey Dictated by:   Kari Baars, M.D. Admit Date:  11/30/2001                               Progress Note  Patient of Dr. John Giovanni in the intensive care unit.  SUBJECTIVE:  This patient has had more problems.  There is concern that she is having alcohol withdrawal syndrome.  She was moved to the intensive care unit yesterday after having had a seizure.  She was placed on Ativan and has required a great deal of Ativan to sedate her.  She is on a typical alcohol withdrawal protocol receiving thiamine, multivitamin, and Ativan.  OBJECTIVE:  Her exam this morning showed that she is awake, alert, but seems somewhat disoriented.  Her heart rate is about 120.  Blood pressure 110/70. Her chest is actually fairly clear.  Her heart is regular with a tachycardia. Her lab work shows that her white count is 11,600, hemoglobin 11.3, hematocrit 33.2, platelets 175.  Her Chem-7 shows a sodium of 126, potassium of 3, chloride 87, CO2 29, BUN 9, creatinine 0.5, blood sugar 131.  ASSESSMENT:  She has had a seizure.  She is undergoing alcohol withdrawal, I believe.  She is tachycardic, has hyponatremia, and hypokalemia.  PLAN:  The plan is to continue with the Ativan per protocol.  Will replace potassium.  Will try to give her a little more fluids since she is tachycardic. Dictated by:   Kari Baars, M.D. Attending Physician:  Milana Obey DD:  12/01/01 TD:  12/01/01 Job: [id number]

## 2010-12-23 NOTE — Group Therapy Note (Signed)
NAMESYDNE, KRAHL              ACCOUNT NO.:  [id number]   MEDICAL RECORD NO.:  [id number]          PATIENT TYPE:  ORB   LOCATION:  S101                          FACILITY:  [facility]   PHYSICIAN:  Hanley Hays. Dechurch, M.D.DATE OF BIRTH:  08-27-1945   DATE OF PROCEDURE:  DATE OF DISCHARGE:                                   PROGRESS NOTE   SUBJECTIVE:  Ms. Cannady is seen for followup.  She has been complaining of  severe arm pain, particularly on her left side.  She is not doing very well  with physical therapy secondary to pain in her hips, although she is able to  do some things.  She is alert.  She is actually eating better now and  swallowing without difficulty.  She gets quite upset when her husband comes  to visit and wants to drink and have a cigarette which we have declined.   OBJECTIVE:  On exam, she is alert.  She is able to answer some questions,  though her speech is slow.  She perseverates.  When I go to evaluate her  arm, she begins to moan and yell, though cannot give me any specific  information.  As she becomes distracted, I am able to do range of motion of  her arm which seems normal.  She does have some dependent edema in the left  hand, but I do not detect any synovitis, joint effusions or other  abnormalities.  The skin is warm.  The shoulder is slightly subluxed.  It  appears as though the exam is limited.  Her lungs are clear.  Her abdomen is  soft.  She has a binder in place.  Extremities, lower extremities, I can  move her lower extremities without difficulty.  The left lower extremity  does not provoke the same reaction.   ASSESSMENT/PLAN:  Left upper-extremity pain and dependent edema.  This is  her stroke-affected side.  I suspect this is stroke-related pain, though  could be a component of an RSD type picture.  I think at this point, would  be to monitor.  Consider a further evaluation.  At this point, her p.r.n.  pain medications appear to be fairly  effective.  We will continue her other  therapies.  The patient is not competent to manage her own affairs nor is  expected to improve significantly.  She would not be able to manage at home  given her needs, and particularly given the fact that her spouse apparently  has alcohol issues, but refuses help.  I discussed this at length with her  sister who is quite concerned.   1.  Status post hemorrhagic cerebrovascular accident.  The patient has made      some progress with therapies.  Will continue to attempt.  2.  PT and OT.  Her dysphagia is improved.  She is tolerating a diet.      Nutrition has made some adjustment to her tube feeding to see if we can      improve her p.o. intake.  3.  Pain, left upper extremity post CVA, with question  of RSD.  We will      continue to monitor and treat.  At some point, may consider a nerve conduction study, although options are  limited.  1.  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  She is being seen in      consultation by Dr. Romeo Apple.  We are awaiting bone scan, CT or MRI if      the patient is able to tolerate, and then proceed from there.  2.  Hypertension, well-controlled on the current regimen.  No changes      planned.  3.  Hypothyroidism.  4.  TSH is stable.  5.  Seizure disorder, on Phenobarbital alone.  No seizures have been noted.      We could consider Neurontin which may help somewhat with the pain.  We      will review.  6.  Hyponatremia.  Sodium has returned to normal.  7.  History of alcohol and tobacco abuse with cognitive impairment.  Prior      to the CVA, according to her family.  Will continue supportive care at      this point.  She is on Lexapro which seems to have helped some of her      lability.  Consider increasing the dose.  She remains on Lunesta for      sleep which has been helpful.  8.  We will begin Ultram t.i.d., 25 mg, to see if we can alleviate some of      her pain complaints.  She has not tolerated  Ibuprofen in the past, nor      codeine.  Again, consider Neurontin.  I discussed her status at length      with her sister whose intentions are quite good.  Apparently, there are      some social issues, and she was referred to social security and social      services.      FED/MEDQ  D:  04/27/2004  T:  04/28/2004  Job:  [id number]

## 2010-12-23 NOTE — Consult Note (Signed)
[name], [name]                        ACCOUNT NO.:  [id number]   MEDICAL RECORD NO.:  [id number]                   PATIENT TYPE:  OBV   LOCATION:  A314                                 FACILITY:  [facility]   PHYSICIAN:  Kofi A. Gerilyn Pilgrim, M.D.              DATE OF BIRTH:  Jun 04, 1946   DATE OF CONSULTATION:  DATE OF DISCHARGE:                                   CONSULTATION   REPORT TITLE:  NEUROLOGY CONSULTATION   CONSULTING PHYSICIAN:  Kofi A. Gerilyn Pilgrim, M.D.   REFERRING PHYSICIAN:  Mila Homer. Sudie Bailey, M.D.   IMPRESSION:  Acute encephalopathy with alteration of consciousness due to  seizure activity and postictal confusion. She also has postictal  neurological deficits with aphagia and mild right hemiparesis.   RECOMMENDATIONS:  There seemed to be significant problems with coughs. The  patient apparently has been kicked out of her home and is staying with  someone else. She is not consistently taking her medications. We have tried  in the past to get her to take the brand but this is obviously an issue with  the patient only having Medicare. She has been taking the generic and even  then it appears she has not been taking this consistently. I had a lengthy  discussion with the patient and with Dr. Sudie Bailey, we decided on a plan of  switching her to phenobarbital. This will be done with caution. She does  report drinking an alcoholic beverage a day. We have instructed her to limit  this to even less than this amount. We will recommend starting the  phenobarbital at 60 mg and increasing it as needed to get her level to 15  and above in the therapeutic range. I suspect that she will need about 100  mg of phenobarbital. Once she is therapeutic then the Dilantin can be  weaned.   HISTORY:  This is a 65 year old right handed African American lady who has  developed seizures approximately a year ago. She was hospitalized for this  condition actually over a year and a half ago. The  EEG showed generalized  epileptic form discharges and occasional left hemispheric slowing. She has  had a left MCA infarction and associated mild right hemiparesis. She  actually did well with very subtle deficits only. Her seizure activity is  described as the inability to talk, falling over, alteration in  consciousness and stiffening and shaking. It typically starts on the right  side. She has only had one grand mal seizure which happened before being  placed on the antiepileptic medications. There has been issues with  compliance which it has been hard to figure out why. After a lengthy  discussion with Dr. Sudie Bailey, it appears that costs may have been the most  significant issue. We have tried to get the patient to take the brand  Dilantin but she apparently has been taking the generic and even then  apparently has  not been taking this consistently. The patient apparently was  home when she developed the acute onset of clonic activity in the right  side. She subsequently became confused and lethargic afterwards, she was  taken to the hospital for further evaluation. Her Dilantin level was  subtherapeutic at 7.2. She was also noted to have hypokalemia 3. Other  electrolytes were unremarkable, urinalysis was normal and WBC unremarkable.   PAST MEDICAL HISTORY:  She has had alcoholism in the past. She does report  taking a drink or so a few times a week but on several questions by myself  and Dr. Sudie Bailey reported that this is controlled by her husband. She has  hypertension, cerebral vascular event in 2001 involving the left MCA  distribution and the onset of seizures diagnosed in April 2003.   PAST SURGICAL HISTORY:  Hysterectomy in 1998.   ADMISSION MEDICATIONS:  Questionable. It appears that she takes 200 mg of  Dilantin a day and also a blood pressure medication but the history is not  really reliable given her cognitive impairment.   SOCIAL HISTORY:  She has had challenges  financially, she is married.   PHYSICAL EXAMINATION:  VITAL SIGNS:  Temperature 100.3, pulse 90,  respirations 20, blood pressure 152/90.  NECK:  Supple.  LUNGS:  Clear to auscultation bilaterally.  CARDIOVASCULAR:  Reveals normal S1 and S2.  ABDOMEN:  Soft.  EXTREMITIES:  No edema.  NEUROLOGICAL:  She is awake, she is alert. She is oriented x2 and appears to  be fine until further in detail questioning is carried out. She is noted to  have subtle but clear language impairment with word finding difficulties,  paraphasic errors and perseveration. She developed some mild problems with  her language comprehension. Fluency is normal. Given the language  impairment, the history is not as clear as it should and she sometimes gets  confused in relating her story. Cranial nerves II-XII are intact including  visual fields. Motor examination shows a mild pronator drip in the right  upper extremity otherwise she has normal tone, bulk and strength in both the  upper and lower extremities involving both the proximal and distal muscles.  Reflexes are +2, plantar reflexes are both down going. Sensory examination  reveals diminished sensation in the right upper and right lower extremities.  Coordination is unremarkable.      ___________________________________________                                            Perlie Gold. Gerilyn Pilgrim, M.D.   KAD/MEDQ  D:  06/17/2003  T:  06/17/2003  Job:  [id number]   cc:   Mila Homer. Sudie Bailey, M.D.  [address] [place], [place] [address]  Fax: [phone]

## 2010-12-23 NOTE — H&P (Signed)
[place]  Patient:    [name], [name]                    MRN: [id number] Adm. Date:  11/05/00 Attending:  Cristi Loron, M.D. CC:         Dr. Margarita Grizzle in ER at [facility]  Dr. John Giovanni, [address], [place], [place] [address]   History and Physical  CHIEF COMPLAINT:  Headache.  HISTORY OF PRESENT ILLNESS:  The patient is a 65 year old black female who was in her usual state of health until yesterday evening.  She returned from work around 3 p.m. and felt "sick" complaining of a headache.  She had some subsequent nausea and vomiting and she went to bed.  According to her husband, she tossed and turned all night long and by the next morning she was no better, may have seemed worse.  He called EMS and she was transported to [facility] where she was evaluated by Dr. Margarita Grizzle.  A cranial CT scan was obtained that demonstrated a left parieto-occipital intracerebral hemorrhage and a neurosurgical consultation was requested.  She was transferred to [facility] for further neurosurgical care and management.  Presently, the patient complains of headache and the information is obtained largely via her husband and sisters.  Apparently she was doing fine until yesterday, as above.  She is a known hypertensive but according to her husband had not been taking her medications for some time now because she thought her blood pressure was okay.  There is no history of seizures, chest pain, shortness of breath, etc.  PAST MEDICAL HISTORY:  As above.  Positive for hypertension.  Also had a history of uterine fibroids, anemia secondary to fibroids, and angioedema. History of alcoholism, hypercholesterolemia.  PAST SURGICAL HISTORY:  Hysterectomy and bilateral salpingo-oophorectomy in 1992.  MEDICATIONS:  According to her medical records she, at least at one point, was on iron, Premarin, Zantac, Xanax, hydrochlorothiazide 25  mg p.o. q.d.  FAMILY MEDICAL HISTORY:  Noncontributory.  SOCIAL HISTORY:  The patient is married, she has no children, lives in [place].  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  She rarely drinks alcohol now.  No history of drug abuse.  She smokes one packs per day of cigarettes.  REVIEW OF SYSTEMS:  Unobtainable.  PHYSICAL EXAMINATION:  GENERAL:  An obese and somewhat confused 65 year old black female.  VITAL SIGNS:  Blood pressure 200/120, heart rate 90, respiratory rate 20, oxygen saturation 98% on 2 liters nasal cannula.  HEENT:  Normocephalic, atraumatic.  Pupils are 4 mm, reactive to 2 mm OU. Sclerae white, conjunctivae pink.  Oropharynx benign.  No signs of trauma.  NECK:  Supple.  There is no meningismus, deformities, tracheal deviation, jugular venous distention, or carotid bruits.  She has a normal cervical range of motion.  THORAX:  Symmetric.  LUNGS:  Clear to auscultation.  HEART:  Regular rate and rhythm.  ABDOMEN:  Obese, soft, nontender.  EXTREMITIES:  No obvious deformities.  BACK:  No deformities.  NEUROLOGIC:  The patient is Glasgow Coma Scale 13 (E4 M6 V3).  She is alert and oriented to person and being in a hospital; she did not know which hospital.  Could not tell me the month or year.  Motor examination demonstrates a right facial droop, mild right hemiparesis.  The patient was not entirely cooperative with the exam so individual muscles were difficult to test.  Cranial nerves 2-12 are intact except as above.  She has a right facial droop.  Vision and hearing exam were difficult to test.  Her pupils were 4 mm and reacted to 2 mm OU.  Sensory testing exam was grossly normal.  Deep tendon reflexes are 2/4 in her bilateral biceps, triceps, quadriceps; trace in her bilateral gastrocnemius.  She has equivocal plantar responses bilaterally. Cerebellar exam was unable to be tested.  LABORATORY DATA:  Imaging studies I have  reviewed.  The patients head CT performed without contrast at [facility] 05, 2000 demonstrates an approximately 3 x 3 cm left parieto-occipital intracerebral hemorrhage with some mild surrounding edema.  There is no significant mass effect or midline shift.  There is no intraventricular hemorrhage.  ASSESSMENT AND PLAN:  Left intracerebral hemorrhage.  The hemorrhage likely represents a cortical hypertensive hemorrhage versus a hemorrhagic infarction (she is a little young for an amyloid angiopathy bleed).  The patient needs to be admitted for close neurologic observation and control of her blood pressure.  I am going to continue the Nipride drip and start her on the hydrochlorothiazide dose that apparently she was on before she stopped taking it.  There is no history of seizure so I am not going to start her on Dilantin.  I am going to repeat her CAT scan in the morning.  I have discussed her condition with the patient and her family.  History of hypertension, etc. noted. DD:  11/05/00 TD:  11/05/00 Job: [id number]

## 2010-12-23 NOTE — Group Therapy Note (Signed)
NAMEMERRIAM, [name]                        ACCOUNT NO.:  [id number]   MEDICAL RECORD NO.:  [id number]                   PATIENT TYPE:  ORB   LOCATION:  S101                                 FACILITY:  [facility]   PHYSICIAN:  Hanley Hays. Dechurch, M.D.           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  Apparently, she  fell on the night of March 04, 2004.  She apparently had been drinking.  She  was found to be weak on her left side and a CT scan showed a large right  parietal intracranial hemorrhage.  She was transferred to [facility] to the neurosurgery service where she was monitored.  There was  evidence on CT scan of mild interval decrease in the size of the right  parietal hemorrhage and no neurosurgical intervention was entertained.  The  patient clinically remained stable.  Her blood pressure apparently was quite  labile during the hospital stay.  At the time of presentation to the  hospital, she had an alcohol level of greater than 300.  The patient  apparently would binge drink when she could get it.  She also had a heavy  tobacco use history, perhaps up to two packs per day, according to her  spouse who is present.  The patient is unable to provide any history  secondary to her neurologic status, that is obtained from the chart and from  the family at the bedside.   PAST MEDICAL HISTORY:  1. Remarkable for the left parietal CVA.  2. Hypertension.  3. Seizure disorder for which she has been on phenobarbital for quite some     time.   No history of surgeries.  She has never been pregnant.   She is currently married for the third time with her current husband of the  last four  years.  Alcohol and tobacco as noted above.  She lived with his  assistance and has been disabled for about two years now secondary to CVA.  He notes she had been compliant with her medications.   PHYSICAL EXAMINATION:  GENERAL:  Reveals a thin female who appears her  stated age who is awake.  NEUROLOGIC:  Occasionally she will yell out some verbiage and appears to be  a little agitated though difficult to understand.  She can not follow any  commands.  She has a spastic paresis on the left side, upper extremity  greater than left lower.  HEENT:  The pupils are reactive.  Fundi are not visualized.  NECK:  Supple.  No JVD, adenopathy.  No thyromegaly is  present.  LUNGS:  Clear to auscultation anterior and posterior.  HEART:  Regular.  Can not appreciate any murmur or gallop.  ABDOMEN:  Flat, soft.  The PEG site is clean.  There is a small amount of  drainage.  The flange is actually eroding into the skin.  EXTREMITIES:  Without clubbing cyanosis.  No edema is present.  She has  increased tone with contractures at the elbow and knee and hip though not  fixed.  No skin rash, lesion, or breakdown is noted.   MEDICATIONS:  At the time of transfer, include:  1. Norvasc 10 mg daily.  2. Lotensin 10 b.i.d.  3. Labetalol 200 b.i.d.  4. Folate a milligram daily.  5. Synthroid 25 mcg daily.  6. Phenobarbital 60 mg b.i.d.  7. Thiamine 100 mg daily.  8. Lortab p.r.n.  9. Prevacid 30 daily.   ALLERGIES:  1. CODEINE.  2. IBUPROFEN.  Reactions are unknown.   ASSESSMENT:  1. 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  2. History of seizure disorder.  3. History of chronic alcohol abuse.  4. Hypothyroidism.  5. History of tobacco abuse.  6. History of hypertension.  The patient will continue her supportive care.     Blood pressures have been acceptable here in the nursing facility with no     significant  hypotension.  She has an occasional diastolic greater than 90     but not consistent.  She appears to be in no distress.  She is having     some very labile behaviors which is normal and this was discussed with     the family.  At some point, we may consider utilizing an SSRI,     particularly as she becomes further into her recovery process.  She had     been on Ativan or another anxiety medication on a regular basis.  We are     through the period of acute withdrawal of alcohol; therefore, I am not     going to continue this though we will have it available to the patient.     She does not appear to be having any pain at this point but does have     some p.r.n. medications available.  7. Hypothyroidism which is apparently a new diagnosis.  We will followup TSH     in about 4-6 weeks.  We will have physical therapy, occupational therapy,     and speech continue to work with the patient, especially in the     maintenance of contracture prevention and reassessment of her swallowing     ability.  She is a 993.03.      ___________________________________________                                            Hanley Hays. Josefine Class, M.D.   FED/MEDQ  D:  03/26/2004  T:  03/26/2004  Job:  [id number]

## 2010-12-23 NOTE — Consult Note (Signed)
[facility]  Patient:    [name], [name] Visit Number: [id number] MRN: [id number]          Service Type: MED Location: ICCU NG29 [id number] Attending Physician:  John Giovanni Dictated by:   Beryle Beams, M.D. Admit Date:  11/30/2001                            Consultation Report  IMPRESSION: 1. Acute onset seizures. 2. Aphasia/acute confusional state possibly related to seizures, possible new    left hemispheric cerebrovascular accident. 3. Left hemispheric lucency.  This is an old finding, however, may indicate an    old infarct. 4. Alcohol dependence.  RECOMMENDATIONS:  Agree with plans with Dilantin.  I would also obtain an MRI of the brain and an electroencephalogram.  HISTORY OF PRESENT ILLNESS:  A 65 year old African-American lady who has a longstanding history of alcoholism, also has a history of hypertension who developed an acute confusional state on the morning of admission.  The morning of presentation, she also had generalized weakness.  She presented to the emergency room with a blood pressure of 200/112.  She was admitted for further evaluation.  She was noted to have generalized tonic-clonic seizures on the floor and was subsequently transferred to the unit.  She was subsequently given Ativan and loaded with Dilantin.  She remains confused, however.  PAST MEDICAL HISTORY:  Hypertension, alcoholism.  ADMISSION MEDICATIONS: 1. Hydrochlorothiazide. 2. Altace.  ALLERGIES:  CODEINE.  REVIEW OF SYSTEMS:  Limited due to confusion.  PHYSICAL EXAMINATION:  VITAL SIGNS:  T max 100, blood pressure 136/99, pulse 99, and respirations 20.  GENERAL:  Patient is awake, alert; however, she is grossly confused, has severe problems with language comprehension of commands.  She has nonsensical speech which is not relevant to conversations at present.  Her speech is comprehensible, no dysarthria noted.  She had significant problems with  naming and following commands due to marked problems with comprehension.  HEENT:  The pupils are 5 mm and reactive.  Visual fields appear to be full. Once again, this is limited due to severe confusion.  Extraocular movements are full.  Facial muscle strength is normal.  Tongue is midline.  She does have evidence of oral trauma to the tongue bilaterally.  EXTREMITIES:  She moves all four extremities.  No drift noted.  Reflexes are brisk, 2.  Toes are both downgoing, respond to pain bilaterally equally.  DATA:  CT scan shows a left lower density in the left parieto-occipital region.  This was seen in previous CT.  Dilantin level done after load shows this is 21.  WBC 11, hemoglobin 11.3, platelets 175, CPK 247.  Sodium 130, potassium 3.5, chloride 95, CO2 29, glucose 201, BUN 14, creatinine 0.9, calcium 8.4.  Thanks for this consultation. Dictated by:   Beryle Beams, M.D. Attending Physician:  John Giovanni DD:  12/01/01 TD:  12/02/01 Job: [id number]

## 2010-12-23 NOTE — Group Therapy Note (Signed)
[name], [name]                        ACCOUNT NO.:  [id number]   MEDICAL RECORD NO.:  [id number]                   PATIENT TYPE:  EMS   LOCATION:  ED                                   FACILITY:  [facility]   PHYSICIAN:  Hanley Hays. Dechurch, M.D.           DATE OF BIRTH:  [birth year]   DATE OF PROCEDURE:  DATE OF DISCHARGE:  03/27/2004                                   PROGRESS NOTE   LABORATORY DATA:  Ms. Selle lab results are reviewed.  She has a mild  leukocytosis.  No fever.  Therefore, no further evaluation at this time.  Her hemoglobin is stable.  Of note is a sodium of 128 with a corresponding  chloride.  Renal function is okay.  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   ASSESSMENT:  She clinically appears stable.  She is not receiving an  inordinate amount of free water.   PLAN:  We will repeat in 1 week and monitor.  She did pull her G-tube out  but this was replaced.  She is being seen by PT and OT.  Hopefully we can  make some progress.  Her mental status and neurologic status is otherwise  the same as my evaluation previously.  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  We will follow up a sodium.  I doubt this is related to  hypothyroidism.      ___________________________________________                                            Hanley Hays Josefine Class, M.D.   FED/MEDQ  D:  03/29/2004  T:  03/29/2004  Job:  [id number]

## 2010-12-23 NOTE — Group Therapy Note (Signed)
NAMECAMAY, PEDIGO                   ACCOUNT NO.:  [id number]   MEDICAL RECORD NO.:  [id number]            PATIENT TYPE:   LOCATION:                                 FACILITY:   PHYSICIAN:  Hanley Hays. Dechurch, M.D.   DATE OF BIRTH:   DATE OF PROCEDURE:  DATE OF DISCHARGE:                                   PROGRESS NOTE   HISTORY OF PRESENT ILLNESS:  Ms. Benninger was seen because of complaints of  leaking G tube.  Nursing notes her G-tube is being sucked in. Indeed the  tube is very mobile and is leaking around it.  Although her p.o. intake is  improved,  she is taking 50-75% of most meals.  I feel that we should still  continue the PEG site for the time being.  We will ask GI to replace the  tube, and we will continue to monitor.  Hopefully, in the next 90 days, it  will be discontinued.  She is making progress with therapy, although she has  no idea of safety awareness.  Her short-term memory limits her therapy  somewhat.  She states her shoulder is feeling better.  She remains on a  Sterapred Dosepak per Dr. Romeo Apple.  She is not complaining of hip pain.  Her mental status is much more alert.  Her verbal skills are improving.  She  still demonstrates some word substitution, but recognizes such, and can  usually make her needs known.  She did have a fall this morning, but  fortunately no evidence of injury.  Again, she is not aware of her safety  issues.  She states she feels like she can get up and walk, when clearly  with her left hemiparesis, she cannot.   Psychiatry saw the patient and made no recommendations.  We will continue  the regimen that I started and not make any other changes at this time.   DIAGNOSES:  1.  Malfunctioning gastrostomy tube.  2.  Status post hemorrhagic cerebrovascular accident with left hemiparesis      and dysphagia/aphasia improving.  3.  Continue with PT/OT.  I signed a disability letter today.  This patient      cannot take care of herself, and would  not be well suited to return to      the environment that she was living in prior with her alcoholic husband.  4.  Anxiety/ depression.  Doing much better on the current regimen.  No      other changes at this point.     Drenda Freeze   FED/MEDQ  D:  05/17/2004  T:  05/17/2004  Job:  [id number]

## 2010-12-23 NOTE — H&P (Signed)
[name], [name]                        ACCOUNT NO.:  [id number]   MEDICAL RECORD NO.:  [id number]                   PATIENT TYPE:  INP   LOCATION:  3109                                 FACILITY:  [facility]   PHYSICIAN:  Stefani Dama, M.D.               DATE OF BIRTH:  19-Sep-1945   DATE OF ADMISSION:  03/04/2004  DATE OF DISCHARGE:                                HISTORY & PHYSICAL   ADMITTING DIAGNOSES:  1. Intracerebral hematoma, right parietal region.  2. Alcohol abuse.  3. Hypertension.   HISTORY OF PRESENT ILLNESS:  The patient is a 65 year old, right handed,  black female who has had a history of a previous stroke and alcohol  intoxication.  She was brought to [facility] Emergency Room with a  cerebrovascular accident.  She was found to be weak on her left side.  CT  scan shows a 6 x 4 x 4-cm hemorrhage on the right side of her head.  The  patient was intoxicated with alcohol level approximating about 300.   PAST MEDICAL HISTORY:  Obtained from the husband by phone as other family  members that were present in the hospital were also intoxicated.  It is  apparent that the patient has a history of:  1. Hypertension.  She has been taking Lotrel.  2. She also has a history of taking some anxiety pills and husband gave the     information that she takes Ativan 1 mg every so often.  3. She has a history of a seizure disorder and takes phenobarbital 100 mg a     day.  4. Blood pressure medication is apparently Lotrel.  5. Her other past medical history is largely unknown other than she has been     disabled for a period of about two years because of her previous stroke.   FAMILY HISTORY:  Not known at this time.   SOCIAL HISTORY:  The patient is married.  She is disabled.  Is apparently a  chronic alcoholic according to the husband.   PHYSICAL EXAMINATION:  VITAL SIGNS:  Reveals blood pressure is 139/83, heart  rate 75.  HEENT:  Pupils are 3-mm, briskly reactive  to light and accommodation.  The  extraocular movements are full.  The face is symmetric but there is a slight  left sided weakness.  There is a strong right gaze preference.  NEUROLOGIC:  There is flaccid left hemiplegia.  She is purposeful with the  right side; however, she will not follow commands, and she is somewhat  combative.  GENERAL:  Reveals that she is rather cachectic-appearing.  NECK:  Supple.  Range of motion is good.  No masses are palpable.  No bruits  are heard.  HEART:  Regular rate and rhythm.  No murmurs are heard.  ABDOMEN:  Soft.  Bowel sounds are positive.  No masses are palpable.  EXTREMITIES:  Reveal no cyanosis,  clubbing, or edema.  LUNGS:  Reveal the breath sounds to be clear bilaterally.   IMPRESSION:  The patient has evidence of intracerebral hemorrhage with  significant mass effect in the right parietal region.  She will be managed  medically.  I have contacted the medical teaching service to obtain some  help in elucidating all her medical factors.  She will be started on some  appropriate medications for her alcoholism.                                                Stefani Dama, M.D.    Merla Riches  D:  03/04/2004  T:  03/04/2004  Job:  [id number]

## 2010-12-23 NOTE — Group Therapy Note (Signed)
[name], [name]              ACCOUNT NO.:  [id number]   MEDICAL RECORD NO.:  [id number]          PATIENT TYPE:  ORB   LOCATION:  S101                          FACILITY:  [facility]   PHYSICIAN:  Hanley Hays. Dechurch, M.D.DATE OF BIRTH:  1946-05-23   DATE OF PROCEDURE:  05/30/2004  DATE OF DISCHARGE:                                   PROGRESS NOTE   The patient is complaining of her PEG tube.  She wants it out.  She has been  upgraded to a dysphagia II diet and seems to be tolerating it well.  She is  participating with therapy.  There have been no real behavior issues.  She  has no complaints right now.  She seems to be well controlled as far as her  pain.  Her speech is becoming more fluent, though still not back to her  baseline.  She remains with a dense left hemiparesis.  Her blood pressures  are reviewed and tend to be on the low side, though she has not had any  orthostasis events.  She did have a low-grade temperature noted at 99.3 but  no other sequelae.  She has had no problems with her stools.   PHYSICAL EXAMINATION:  GENERAL:  She is alert and pleasant.  Positive  expressive aphasia but improved.  Basically no significant changes just some  trace dependent edema at the left foot.  Otherwise unremarkable.   ASSESSMENT AND PLAN:  1.  Status post cerebrovascular accident, hemorrhagic, is making good      progress with therapies.  2.  History of dysphagia.  We will switch to all p.o. medications, continue      her dysphagia II diet.  If weight is stable and taking p.o.'s well, we      will schedule to have her G-tube removed.  3.  History of seizure disorder.  Phenobarbital level is ordered, but I      cannot find the result in the chart.  We will reorder.  In addition, we      will follow up a CBC and BMP.  4.  Hypertension.  Blood pressures are well controlled,  a little on the low      side with no orthostasis.  We will decrease her Norvasc and follow.     Drenda Freeze   FED/MEDQ  D:  05/30/2004  T:  05/30/2004  Job:  [id number]

## 2010-12-23 NOTE — Discharge Summary (Signed)
[name], [name]                        ACCOUNT NO.:  [id number]   MEDICAL RECORD NO.:  [id number]                   PATIENT TYPE:  INP   LOCATION:  3312                                 FACILITY:  [facility]   PHYSICIAN:  Darrol Jump, MD                     DATE OF BIRTH:  04-Dec-1945   DATE OF ADMISSION:  03/04/2004  DATE OF DISCHARGE:  03/18/2004                                 DISCHARGE SUMMARY   RESIDENT PHYSICIAN:  Chapman Fitch, M.D.   DISCHARGE DIAGNOSES:  1. Right parietal intracranial hemorrhage.  2. Hypertension.  3. Seizure disorder.  4. History of ethanol abuse.  5. Hypothyroidism.  6. History of tobacco abuse.  7. PEG dependence for nutritional support.   DISCHARGE MEDICATIONS:  1. A 10 mg of Norvasc daily via PEG.  2. A 10 mg of Lotensin daily through the PEG.  3. A 200 mg of labetalol b.i.d. through the PEG.  4. Folic acid 1 mg daily through the PEG.  5. Synthroid 25 mcg daily through the PEG.  6. Phenobarbital 60 mg b.i.d. through the PEG.  7. Diamine 100 mg daily through the PEG.   CONSULTATIONS:  Dr. Matthias Hughs, gastroenterology.   DISPOSITION:  The patient is being discharged to a nursing home in  [place], [place].  [facility].  The patient is to follow  up with the nursing home physician there at the facility who will monitor  her medical needs.   PROCEDURES PERFORMED DURING THIS ADMISSION:  CT scan of the head on March 04, 2004 demonstrated large lobar hemorrhage in the right parietal occipital  lobe.  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  On March 05, 2004 a CT scan of the head showed stable size of the hemorrhage in the right  parietal lobe with surrounding edema.  Mass effect on the right lateral  ventricle without change.  CT scan on March 08, 2004 demonstrated mild  interval decrease in size of the right parietal hemorrhage.  A mild increase  in associated mass effect in the right cerebral hemisphere with  progressive  effacement of the cortical sulci and 6 mm shift of the midline structures  from left to right.  She had a stable old left parietal infarct and stable  mild diffuse cerebral atrophy.  CT scan of the head on March 10, 2004  showed a stable right parietal hematoma with diffuse right hemispheric edema  decreased from previous.  CT angio of the chest March 12, 2004 showed no  evidence of acute pulmonary embolus and bibasilar atelectasis.  Swallowing  evaluation on March 16, 2004 demonstrated the patient would not be able to  meet her nutritional needs through oral feeding and needed a PEG tube  placement.   BRIEF ADMISSION HISTORY AND PHYSICAL:  The patient is a 65 year old African  American female who presented to [facility] on March 04, 2004 with left-sided  weakness and found to have an intracerebral hemorrhage in the right parietal  region.  The patient was then transferred to [facility] for neurosurgery  management.  Our service was consulted for medical management of the  patient.  The patient was disoriented, unable to cooperate with history and  exam on admission.   VITAL SIGNS:  Her vitals on admission:  She had a pulse of 75, blood  pressure of 154/90, temperature of 99, respirations 16-29, saturations of  100% on room air.  GENERAL:  She appeared lethargic but she was arousable.  She was confused  and uncooperative.  NEUROLOGICAL EXAM:  She is able to move the right side only.  She had  flaccid paralysis on the left side with an upgoing Babinski on the left.  Reflexes decreased on the left.  The patient was disoriented and  uncooperative.  LUNGS:  Her lungs were clear to auscultation bilaterally.  CARDIOVASCULAR:  She had a regular rate and rhythm on her cardiovascular  exam and no cyanosis, clubbing or edema on her extremities.   ADMISSION LABORATORIES:  Ethanol level was 322.  Cardiac enzymes are  negative.  Sodium was 146, potassium was 3.2, chloride was 114.   Bicarb was  23, BUN was 8, creatinine was 0.4, glucose was 93, bilirubin was 0.4,  alkaline phosphatase was 73, SGOT was 19, SGPT was 11, protein was 6.4,  albumin was 3.2 and calcium was 8.6.   HOSPITAL COURSE BY ACTIVE PROBLEM:  1. Intracranial hemorrhage.  The patient was monitored in the neuro ICU for     four days for evidence of increased intracranial pressure.  Once outside     this window, the patient was transferred to the step down unit and has     been monitored there since.  She is outside the 72 hour window to be     concerned for increased intracranial pressure although she is at a slight     risk for increased seizure activity.   1. Hypertension.  The patient was difficult to control on her     antihypertensive regimen consistently for the first week of admission     having systolics ranging close to 200.  She is currently ranged between     140-160 on the current regimen of 10 mg Norvasc daily, 10 mg of Lotensin     daily and 200 mg of labetalol b.i.d.  The nursing home physician can     adjust her blood pressure medicines as needed to control her pressure     either titrating with the labetalol depending on heart rate or increasing     the Lotensin.   1. Seizure disorder.  The patient is being maintained on a therapeutic level     of phenobarbital 60 mg b.i.d.   1. Nutrition.  The patient had a PEG tube placed on March 17, 2004.  The     patient has been receiving medications and tube feeds through the tube     without problem.  PT and OT:  The patient will need aggressive inpatient     rehabilitation in the nursing home to try to get back to her baseline.   DISCHARGE LABORATORIES:  On the day of discharge, white blood cell count  10.5, hemoglobin 11.2, which has been stable, platelet count 435,000.  Sodium 133, potassium 3.8, chloride 98, bicarb 24, glucose 138, BUN 11, creatinine 0.5 and calcium 9.3.  Her last phenobarbital level on March 12, 2004 was  22.6.                                                Darrol Jump, MD    SD/MEDQ  D:  03/18/2004  T:  03/18/2004  Job:  [id number]   cc:   Stefani Dama, M.D.  [address]  [place]  [place] [address]  Fax: [phone]   [facility]  [place], [place]   Bernette Redbird, M.D.  [address] [place]  [place], [place] [address]  Fax: [phone]

## 2010-12-23 NOTE — Group Therapy Note (Signed)
NAMEDAINA, CARA                        ACCOUNT NO.:  [id number]   MEDICAL RECORD NO.:  [id number]                   PATIENT TYPE:  ORB   LOCATION:  S101                                 FACILITY:  [facility]   PHYSICIAN:  Brandi Hays. Dechurch, M.D.           DATE OF BIRTH:  Jun 19, 1946   DATE OF PROCEDURE:  04/13/2004  DATE OF DISCHARGE:                                   PROGRESS NOTE   HISTORY:  I was asked to see [name] because of increasing agitative  behaviors overall.  She has intermittent outbursts where she becomes quite  agitated and is difficult to redirect.  Today, she is seen sleeping.  She  awakens, she denies any complaints, she denies any pain.  She was having  some increased pain in her right hip after a fall.  She was noted to have  degenerative changes on a x-ray with a question of avascular necrosis of the  femoral head.  No previous x-rays were available for comparison.  Orthopaedic consultation has been scheduled and is pending at this time.  I  do not see that psychiatry has yet come to evaluate the patient for  adjustment in her medical regimen.  She has had no seizures.  She has had no  new interval issues.  She was noted to have a stool impaction, it has been  disimpacted, and since then has not had problems with bowels.  She denies  any complaints at present.  Her blood pressure's for the most part are well  controlled, occasional diastolic of 90, but for the most part her systolic's  are in the low 100s.  She denies any dizziness, though again, she is a  little lethargic today.   PHYSICAL EXAMINATION:  GENERAL:  She arouses, denies complaints, no  distress.  LUNGS:  Clear.  ABDOMEN:  Soft, nontender.  EXTREMITIES:  Without cyanosis, clubbing, or edema.  Left-sided weakness,  unchanged.  SKIN:  No skin rashes or lesions are noted.  She has no skin breakdown  ____________.  She is sitting in the chair at this point and I am not able  to examine her  sacrum.   ASSESSMENT AND PLAN:  1.  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  2.  History of substance abuse with somewhat erratic agitative behaviors.      According to the family, this is more of her nature than an abnormality.      Her speech is much improved, and she is apparently almost back to      baseline according to her family.  She was started on Lexapro which      seems to have helped somewhat.  She states she feels better.  She is      sleeping better.  3.  Seizure disorder which preceded her stroke.  She is on phenobarbital.  Depakote might be considered as an addition for behavior issues, would      not make other changes at this point.  She is also on Ativan 1 mg t.i.d.      which does not appear to be causing significant sedation.  She continues      to be seen by therapy.      ___________________________________________                                            [name], M.D.   FED/MEDQ  D:  04/13/2004  T:  04/13/2004  Job:  [id number]

## 2010-12-23 NOTE — Procedures (Signed)
ELECTROENCEPHALOGRAPHIC NUMBER:  [id number].   CLINICAL HISTORY:  The patient is a 65 year old who is awake and confused.   DESCRIPTION OF PROCEDURE:  The tracing was carried out on a 32-channel  digital Cadwell recorder reformatted into 16 channel montages with one  devoted to EKG.  The patient was awake and confused.  The international  10/20system lead placement was used.  The patient has a right parietal  distribution intracerebral hematoma.  Medications include folic acid,  Lopressor, thiamine, Protonix, phenobarbital, Norvasc, Lotensin, Vasotec,  and Synthroid among others.   DESCRIPTION OF FINDINGS:  There is a significant asymmetry between  hemispheres.  Over the left hemisphere, 10-15 mv alpha and upper theta range  activity predominates.  Over the left hemisphere, particularly in the left  frontal central regions, pseudo periodic lateralized epileptiform discharges  predominate and polymorphic delta range activity is seen over the temporal  derivations.   Activating procedures with intermittent photic stimulation failed to induce  a definite driving response.  EKG showed a regular sinus rhythm with  ventricular response of 84 beats per minute.   IMPRESSION:  Abnormal electroencephalogram on the basis of the above-  described focal slowing and pseudo periodic lateralized epileptiform  discharges over the right hemisphere.  This is consistent with the given  clinical history of intracerebral hematoma in the right parietal region.  The patient has a lowered threshold for seizures.    WILLIAM H. Sharene Skeans, M.D.   UXL:KGMW  D:  03/14/2004 12:23:28  T:  03/14/2004 13:59:50  Job #:  [id number]   cc:   Santina Evans A. Orlin Hilding, M.D.  [address]  [address]  [place]  [place] [address]  Fax: [phone]

## 2010-12-23 NOTE — H&P (Signed)
[facility]  Patient:    [name], [name] Visit Number: [id number] MRN: [id number]          Service Type: MED Location: ICCU [id number] Attending Physician:  John Giovanni Dictated by:   Kari Baars, M.D. Admit Date:  11/30/2001   CC:         John Giovanni, M.D.   History and Physical  REASON FOR ADMISSION:  Change in mental status.  HISTORY OF PRESENT ILLNESS:  The patient is a 65 year old who came to the emergency room with change in mental status of unknown cause.  Her family members say that she was in her normal state of fairly good health and became confused this morning, presented to the emergency room, complained of numbness on the right side, was dizzy.  She has a past medical history of hypertension. She has been on hydrochlorothiazide and another medication called ______ , I am not sure what that is.  She had a previous history of a stroke which was an intracerebral bleed.  SOCIAL HISTORY:  She is a nonsmoker.  Apparently has had some history of alcohol use, but I do not think it is active now.  FAMILY HISTORY:  Positive for hypertension of many family members.  REVIEW OF SYSTEMS:  Except as mentioned is essentially negative.  ALLERGIES:  CODEINE.  PHYSICAL EXAMINATION:  VITAL SIGNS:  On admission her temperature was 99.7, pulse 94, blood pressure 200/112, O2 saturation 90% on room air.  HEENT:  Pupils are reactive to light and accommodation.  Nose and throat are clear.  GENERAL:  She seems to be somewhat confused but is cooperative.  NECK:  Supple.  Without masses, bruits, or jugular venous distention.  CHEST:  Fairly clear.  HEART:  Regular, without murmur, gallop, or rub.  ABDOMEN:  Soft, no masses felt.  EXTREMITIES:  Showed no edema.  NEUROLOGIC:  Change in mental status.  LABORATORY DATA:  CT scan did show previous area of bleeding in the left parieto-occipital region, but nothing acute.  Sodium 133,  potassium 3.5, chloride 96, CO2 29, glucose 201, BUN 14, creatinine 0.7, calcium 8.  CK 247, MB 1.6, troponin 0.01.  White count 10,600, hemoglobin 11, neutrophils 51, lymphocytes 40.  EKG showed sinus tachycardia.  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  PLAN:  Treat her hypertension.  Will go ahead and check cultures since she does have some minimal fever.  Neurologic checks frequently and follow. Dictated by:   Kari Baars, M.D. Attending Physician:  John Giovanni DD:  11/30/01 TD:  12/01/01 Job: [id number]

## 2010-12-23 NOTE — Group Therapy Note (Signed)
NAMESAFIYYAH, VASCONEZ                        ACCOUNT NO.:  [id number]   MEDICAL RECORD NO.:  [id number]                   PATIENT TYPE:  ORB   LOCATION:  S101                                 FACILITY:  [facility]   PHYSICIAN:  Hanley Hays. Dechurch, M.D.           DATE OF BIRTH:  12/09/1945   DATE OF PROCEDURE:  DATE OF DISCHARGE:                                   PROGRESS NOTE   Ms. Maka has had quite agitated behavior, thrashing about, pulling things  over, very uncooperative, and somewhat combative at times.  Her medications  have been adjusted, and she seems to be much more relaxed.  Her verbal  abilities are improving.  She is able to cooperative.  She is complaining of  some pain now in her right femur, right hip area which she states is new.  She slept well but also noted to have a fecal impaction, and an episode of  vomiting, and her bowel regimen has been augmented.  The patient had pulled  out her PEG tube last week, requiring a trip to the emergency room for  reinsertion.   PHYSICAL EXAMINATION:  GENERAL:  She is alert, quite pleasant, smiling,  cooperative, appears a little hard of hearing particularly on the right side  but yet can follow commands.  She is actually verbal, much more fluent than  the past and can make her needs known.  NEUROLOGIC:  She has a left hemiparesis, though not totally flaccid.  LUNGS:  Clear to auscultation.  HEART:  Regular.  No murmur.  ABDOMEN:  Soft and flat.  EXTREMITIES:  No edema is present.   ASSESSMENT/PLAN:  1.  Right hip leg pain.  Will assess for fracture, given her history of      falls.  2.  Agitated behavior with a history of substance abuse in a patient who      apparently can be agitated even when she is not sick.  She had taken      benzodiazepines on a regular basis at home, in addition to drinking.  We      have increased her Ativan and this seems to have been very effective.  I      have also increased her Lexapro as well,  and we started Lunesta for      sleep.  Hopefully, we can improve her day/night reversal and gradually      taper medications.  Psychiatry has been consulted to assist in her      management, particularly in regards to her substance abuse and long term      therapy, though I do not think they will have much to add medically.      Gradually, we hope we will be able to decrease the Ativan, but I am not      making any changes in her regimen at this point.  Speech has been      consulted but  apparently did not receive the evaluation order;      therefore, we will do it again.  3.  Constipation with fecal impaction.  Bowel regimen has been instituted.      We will need to monitor for loose stools and more importantly for      evidence of constipation with impaction.  4.  Hyponatremia.  We will check followup BMP and continue to monitor.  Her      current medications are reviewed.  No other changes are intended at this      point.     ___________________________________________                                            Hanley Hays. Josefine Class, M.D.   FED/MEDQ  D:  04/05/2004  T:  04/06/2004  Job:  [id number]

## 2010-12-23 NOTE — Op Note (Signed)
[name], STANISLAWSKI              ACCOUNT NO.:  [id number]   MEDICAL RECORD NO.:  [id number]          PATIENT TYPE:  AMB   LOCATION:  DAY                           FACILITY:  [facility]   PHYSICIAN:  R. Roetta Sessions, M.D. DATE OF BIRTH:  12-19-1945   DATE OF PROCEDURE:  06/16/2004  DATE OF DISCHARGE:                                 OPERATIVE REPORT   PROCEDURE:  Percutaneous endoscopic gastrostomy removal.   INDICATIONS FOR PROCEDURE:  The patient is a 65 year old lady with a history  of cranial hemorrhage with secondary dysphagia requiring PEG tube feedings.  I just changed this nice lady's PEG tube out on May 18, 2004. Her  swallowing function has improved dramatically over the past several weeks.  Dr. Josefine Class requested that the PEG tube be removed as she is no longer  using it. Discussed this with patient and patient's sister at length. The  potential for reinsertion if dysphagia recurred was discussed. The patient  and family want the tube out.   DESCRIPTION OF PROCEDURE:  Utilizing the traction technique, the indwelling  20-French barbed mushroom internal bumper tube was removed without  difficulty. The patient tolerated the procedure well.   PLAN:  Keep 4 x 4s dressed over the ostomy until drainage subsides which  will likely be 2 to 3 days. Follow up with Dr. Josefine Class as needed.     Otelia Sergeant   RMR/MEDQ  D:  06/16/2004  T:  06/17/2004  Job:  [id number]   cc:   Hanley Hays. Dechurch, M.D.  [address]  [place]  [place] [address]  Fax: [phone]

## 2010-12-23 NOTE — Discharge Summary (Signed)
[place]  [name]:    [name], [name]                     MRN: [id number] Adm. Date:  [id number] Disc. Date: [id number] Attending:  Herold Harms Dictator:   Junie Bame, P.A. CC:         Dr. Lovell Sheehan  Dr. Joanie Coddington   Discharge Summary  DISCHARGE DIAGNOSES: 1. Status post intracranial hemorrhage. 2. Hypertension. 3. Hypokalemia. 4. Urinary tract infection (Escherichia coli). 5. Alcoholism. 6. Tobacco abuse.  HISTORY OF PRESENT ILLNESS:  [name] is a 65 year old, African-American female with a past medical history of hypotension who was admitted on April 1, for evaluation of headache, nausea and vomiting.  Head CT on November 06, 2000, revealed a left parieto-occipital intracranial hemorrhage.  Neurosurgeon was Dr. Lovell Sheehan who evaluated her.  [name] was not placed on any Dilantin, but was placed on Nipride drip and blood pressure was monitored closely. Followup head CT demonstrated no changes.  Carotid Dopplers were negative and echocardiogram demonstrated ejection fraction to be 55-65% with normal left ventricular function.  [name] is presently taking Bactrim for E. coli urinary tract infection.  PT report at that time demonstrated that [name] was ambulating five steps with 2+ handheld assist.  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  Primary care physician is Dr. Joanie Coddington.  PAST MEDICAL HISTORY: 1. Hypotension. 2. Anemia. 3. Fibroids. 4. Angioedema. 5. Hypercholesterolemia. 6. Alcoholism. 7. GERD.  PAST SURGICAL HISTORY:  Hysterectomy.  MEDICATIONS: 1. Iron. 2. Premarin. 3. Zantac. 4. Xanax. 5. K-Dur. 6. HCTZ. 7. Paxil.  SOCIAL HISTORY:  [name] is married without children and lives in [place] in one-level home with one step to entry.  [name] was independent prior to admission.  She states she rarely drinks alcohol now,  but smokes one pack a day.  ALLERGIES:  CODEINE.  MOTRIN.  HOSPITAL COURSE:  [name] was admitted to rehabilitation on November 09, 2000, for comprehensive rehabilitation where she received more than three hours of speech therapy, physical therapy and occupational therapy daily.  [name] had no major medical issues during her 12-day rehabilitation stay.  She remained on Bactrim throughout Brandi seven-day course for E. coli urinary tract infection and repeat cultures were done revealing that E. coli was resistant to Bactrim. [name] was then switched to Cipro 250 mg b.i.d. x 7 days.  Brandi main concern during [name] stay was her safety.  She had restraint orders including bed rails x 4 and bed chair arms due to her ambulating without assistance.  Her blood pressures remained moderately controlled throughout her stay with hydrochlorothiazide.  She received potassium daily for hypokalemia. Latest labs indicated that her hemoglobin was 14.3, hematocrit 43.0.  Sodium was 133 and potassium 5.3 with glucose 126, BUN 25 and creatinine 10.2.  At Brandi time of discharge, vitals were stable.  [name] cognitive function improved.  She was ambulating independently with supervision.  DISCHARGE MEDICATIONS: 1. Clonidine patch 0.25 mg q.24h. 2. K-Dur 20 mEq one tablet daily. 3. Hydrochlorothiazide 50 mg one tablet daily. 4. Ciprofloxacin 250 mg. 5. Tylenol 375 mg one to two tablets every four to six hours as needed for    pain.  SPECIAL INSTRUCTIONS:  She will receive occupational therapy and speech therapy at [facility] beginning on April 19, at 8 a.m.  She was also provided information  on Alcoholics Anonymous and mental health.  She was given special instructions to avoid alcohol, no driving and no smoking and to follow up with Dr. Lovell Sheehan.  She is to have supervision most of Brandi day.  FOLLOWUP:  She is to follow up with Dr. Lovell Sheehan in one month.  Follow up  with Dr. Thomasena Edis within four to six weeks.  Follow up with Dr. Joanie Coddington in two weeks. D:  11/21/00 TD:  11/22/00 Job: [id number]

## 2010-12-23 NOTE — Group Therapy Note (Signed)
[facility]  Patient:    [name], [name] Visit Number: [id number] MRN: [id number]          Service Type: MED Location: ICCU YN82 [id number] Attending Physician:  Milana Obey Dictated by:   Kari Baars, M.D. Admit Date:  11/30/2001                               Progress Note  SUBJECTIVE:  I was called to see the patient after she had a seizure.  She apparently had a grand mal seizure that was not witnessed by nursing personnel, but she did bite her tongue; and then later had, what was probably a focal seizure in her face.  She was clearly postictal after the first seizure with some confusion.  New history from her family is that she has resumed drinking alcohol and apparently has been drinking steadily for the last 2 weeks, but they do not know when her last drink was.  She is now awake and alert.  Her blood pressure is about 190/100.  An addendum to the information above, is that she had a great deal of urine in her bladder even after having to void urgently x2 prior to having the catheter placed.  PLAN:  The plan is to transfer her to the intensive care unit.  Start her on Ativan.  Go ahead and treat her with Cerebryx and begin DT prophylaxis, etc. I have asked for neurology consultation as well. Dictated by:   Kari Baars, M.D. Attending Physician:  Milana Obey DD:  11/30/01 TD:  12/01/01 Job: [id number]

## 2010-12-23 NOTE — Group Therapy Note (Signed)
[facility]  Patient:    [name], [name] Visit Number: [id number] MRN: [id number]          Service Type: MED Location: ICCU YN82 [id number] Attending Physician:  John Giovanni Dictated by:   John Giovanni, M.D. Proc. Date: 12/02/01 Admit Date:  11/30/2001                               Progress Note  SUBJECTIVE:  The patient was admitted three days ago after having had seizures.  It turned out that she had been drinking again in the last two weeks.  Last year she had had a cerebral bleed and DTs related to drinking. She is now in the intensive care unit and actually used 28 mg of Ativan IV yesterday.  She is restrained.  She really does not say anything to me, but the speech is a little bit slurred, and finally she does say "Dr. Sudie Bailey," so I assume she recognizes me.  OBJECTIVE:  She is currently restrained at least by arms.  Her pressure had been high when she came into the hospital, but yesterday once she had been getting the IV Ativan, did much better with the last pressure measuring 134/102.  Other vital signs were stable, with the pulse going on the high side at 100-110.  Today her lungs are clear throughout.  The heart has a regular rhythm, rate of around 100 at present.  The abdomen is soft without tenderness, without hepatosplenomegaly or masses.  No edema in the ankles.  ASSESSMENT 1. Alcohol withdrawal syndrome. 2. Status post cerebral bleed.  PLAN:  MRI as per neurologist, Dr. Beryle Beams.  Increase IV fluids, as she is really taking nothing by mouth.  Continue the IV Ativan.  Watch for fluid status with daily weights if possible. Dictated by:   John Giovanni, M.D. Attending Physician:  John Giovanni DD:  12/02/01 TD:  12/02/01 Job: [id number]

## 2014-05-03 ENCOUNTER — Emergency Department (HOSPITAL_COMMUNITY): Payer: Medicare HMO

## 2014-05-03 ENCOUNTER — Encounter (HOSPITAL_COMMUNITY): Payer: Self-pay | Admitting: Emergency Medicine

## 2014-05-03 ENCOUNTER — Inpatient Hospital Stay (HOSPITAL_COMMUNITY)
Admission: EM | Admit: 2014-05-03 | Discharge: 2014-05-06 | DRG: 100 | Disposition: A | Discharge status: Home / Self Care | Payer: Medicare HMO | Attending: Family Medicine | Admitting: Family Medicine

## 2014-05-03 DIAGNOSIS — G936 Cerebral edema: Secondary | ICD-10-CM | POA: Diagnosis present

## 2014-05-03 DIAGNOSIS — Z23 Encounter for immunization: Secondary | ICD-10-CM

## 2014-05-03 DIAGNOSIS — I728 Aneurysm of other specified arteries: Secondary | ICD-10-CM | POA: Diagnosis present

## 2014-05-03 DIAGNOSIS — G40909 Epilepsy, unspecified, not intractable, without status epilepticus: Principal | ICD-10-CM | POA: Diagnosis present

## 2014-05-03 DIAGNOSIS — R1310 Dysphagia, unspecified: Secondary | ICD-10-CM | POA: Diagnosis present

## 2014-05-03 DIAGNOSIS — Z8673 Personal history of transient ischemic attack (TIA), and cerebral infarction without residual deficits: Secondary | ICD-10-CM | POA: Diagnosis not present

## 2014-05-03 DIAGNOSIS — F17200 Nicotine dependence, unspecified, uncomplicated: Secondary | ICD-10-CM | POA: Diagnosis present

## 2014-05-03 DIAGNOSIS — D320 Benign neoplasm of cerebral meninges: Secondary | ICD-10-CM | POA: Diagnosis present

## 2014-05-03 DIAGNOSIS — Z7982 Long term (current) use of aspirin: Secondary | ICD-10-CM

## 2014-05-03 DIAGNOSIS — I639 Cerebral infarction, unspecified: Secondary | ICD-10-CM

## 2014-05-03 DIAGNOSIS — I10 Essential (primary) hypertension: Secondary | ICD-10-CM | POA: Diagnosis present

## 2014-05-03 DIAGNOSIS — H9190 Unspecified hearing loss, unspecified ear: Secondary | ICD-10-CM | POA: Diagnosis present

## 2014-05-03 DIAGNOSIS — R4182 Altered mental status, unspecified: Secondary | ICD-10-CM | POA: Diagnosis present

## 2014-05-03 HISTORY — DX: Cerebral infarction, unspecified: I63.9

## 2014-05-03 HISTORY — DX: Essential (primary) hypertension: I10

## 2014-05-03 HISTORY — DX: Unspecified convulsions: R56.9

## 2014-05-03 LAB — CBC WITH DIFFERENTIAL/PLATELET
BASOS ABS: 0 10*3/uL (ref 0.0–0.1)
BASOS PCT: 1 % (ref 0–1)
Eosinophils Absolute: 0.1 10*3/uL (ref 0.0–0.7)
Eosinophils Relative: 1 % (ref 0–5)
HEMATOCRIT: 37.9 % (ref 36.0–46.0)
Hemoglobin: 12.9 g/dL (ref 12.0–15.0)
Lymphocytes Relative: 44 % (ref 12–46)
Lymphs Abs: 2.9 10*3/uL (ref 0.7–4.0)
MCH: 30.6 pg (ref 26.0–34.0)
MCHC: 34 g/dL (ref 30.0–36.0)
MCV: 89.8 fL (ref 78.0–100.0)
MONO ABS: 0.7 10*3/uL (ref 0.1–1.0)
Monocytes Relative: 10 % (ref 3–12)
Neutro Abs: 2.8 10*3/uL (ref 1.7–7.7)
Neutrophils Relative %: 44 % (ref 43–77)
PLATELETS: 265 10*3/uL (ref 150–400)
RBC: 4.22 MIL/uL (ref 3.87–5.11)
RDW: 14.4 % (ref 11.5–15.5)
WBC: 6.4 10*3/uL (ref 4.0–10.5)

## 2014-05-03 LAB — PROTIME-INR
INR: 1.03 (ref 0.00–1.49)
Prothrombin Time: 13.5 seconds (ref 11.6–15.2)

## 2014-05-03 LAB — BASIC METABOLIC PANEL
Anion gap: 14 (ref 5–15)
BUN: 10 mg/dL (ref 6–23)
CO2: 26 mEq/L (ref 19–32)
CREATININE: 0.7 mg/dL (ref 0.50–1.10)
Calcium: 9.6 mg/dL (ref 8.4–10.5)
Chloride: 98 mEq/L (ref 96–112)
GFR calc Af Amer: >90 mL/min (ref >90)
GFR, EST NON AFRICAN AMERICAN: 88 mL/min — AB (ref >90)
Glucose, Bld: 114 mg/dL — ABNORMAL HIGH (ref 70–99)
Potassium: 4.5 mEq/L (ref 3.7–5.3)
SODIUM: 138 meq/L (ref 137–147)

## 2014-05-03 LAB — I-STAT CHEM 8, ED
BUN: 9 mg/dL (ref 6–23)
CALCIUM ION: 1.18 mmol/L (ref 1.13–1.30)
CHLORIDE: 105 meq/L (ref 96–112)
CREATININE: 0.8 mg/dL (ref 0.50–1.10)
Glucose, Bld: 115 mg/dL — ABNORMAL HIGH (ref 70–99)
HCT: 41 % (ref 36.0–46.0)
Hemoglobin: 13.9 g/dL (ref 12.0–15.0)
Potassium: 4.4 mEq/L (ref 3.7–5.3)
Sodium: 135 mEq/L — ABNORMAL LOW (ref 137–147)
TCO2: 28 mmol/L (ref 0–100)

## 2014-05-03 LAB — I-STAT TROPONIN, ED: TROPONIN I, POC: 0 ng/mL (ref 0.00–0.08)

## 2014-05-03 LAB — APTT: aPTT: 27 seconds (ref 24–37)

## 2014-05-03 LAB — ETHANOL: Alcohol, Ethyl (B): <11 mg/dL (ref 0–11)

## 2014-05-03 MED ORDER — ASPIRIN 300 MG RE SUPP
300.0000 mg | Freq: Once | RECTAL | Status: AC
  Administered 2014-05-03: 300 mg via RECTAL
  Filled 2014-05-03: qty 1

## 2014-05-03 MED ORDER — CLOPIDOGREL BISULFATE 75 MG PO TABS: 75.0000 mg | ORAL_TABLET | Freq: Every day | ORAL | Status: DC

## 2014-05-03 MED ORDER — ASPIRIN 325 MG PO TABS: 325.0000 mg | ORAL_TABLET | Freq: Once | ORAL | Status: DC

## 2014-05-03 NOTE — ED Provider Notes (Signed)
CSN: [id number]     Arrival date & time 05/03/14  2100 History  This chart was scribed for Nat Christen, MD, by Neta Ehlers, ED Scribe. This patient was seen in room APA06/APA06 and the patient's care was started at 9:20 PM.   First MD Initiated Contact with Patient 05/03/14 2116     Chief Complaint  Patient presents with  . Altered Mental Status    The history is provided by the spouse and a relative. The history is limited by the condition of the patient. No language interpreter was used.   Level Five Caveat: Mental Status Change  HPI Comments: [name] is a 68 y.o. female, with a h/o seizures and stroke,  who was brought to the ED via EMS presenting with sudden-onset AMS which began at 18:00. Her husband reports the pt has been yelling and exhibiting gibberish speech. At bedside, the pt is also coughing, spitting, and sneezing. Her family states she was normal this evening prior to the AMS; they endorse she had normal speech, behavior, and mobility. The pt had a stroke six years ago in 2009; the stroke impaired her left hand.  She developed seizures after the stroke; she takes phenobarbital for seizures. Her sister denies a h/o psychiatric problems. The pt has a h/o HTN. She is a current smoker.   Past Medical History  Diagnosis Date  . Stroke   . Seizures   . Hypertension    Past Surgical History  Procedure Laterality Date  . Peg tube placement    . Peg tube removal     History reviewed. No pertinent family history. History  Substance Use Topics  . Smoking status: Current Every Day Smoker    Types: Cigarettes  . Smokeless tobacco: Not on file  . Alcohol Use: No   No OB history provided.  Review of Systems  Unable to perform ROS: Mental status change    A complete 10 system review of systems was obtained, and all systems were negative except where indicated in the HPI and PE.    Allergies  Codeine  Home Medications   Prior to Admission medications    Medication Sig Start Date End Date Taking? Authorizing Provider  amLODipine-benazepril (LOTREL) 5-10 MG per capsule Take 1 capsule by mouth daily.   Yes Historical Provider, MD  atorvastatin (LIPITOR) 40 MG tablet Take 40 mg by mouth daily.   Yes Historical Provider, MD  FLUoxetine (PROZAC) 10 MG capsule Take 10 mg by mouth daily.   Yes Historical Provider, MD  labetalol (NORMODYNE) 100 MG tablet Take 100 mg by mouth 2 (two) times daily.   Yes Historical Provider, MD  levothyroxine (SYNTHROID, LEVOTHROID) 25 MCG tablet Take 25 mcg by mouth daily before breakfast.   Yes Historical Provider, MD  PHENobarbital (LUMINAL) 64.8 MG tablet Take 64.8 mg by mouth at bedtime.   Yes Historical Provider, MD   Triage Vitals: BP 137/123  Pulse 101  Temp(Src) 98.1 F (36.7 C) (Oral)  Resp 21  Ht 5\' 5"  (1.651 m)  Wt 110 lb (49.896 kg)  BMI 18.31 kg/m2  SpO2 98%  Physical Exam  Nursing note and vitals reviewed. Constitutional: She appears well-developed and well-nourished.  HENT:  Head: Normocephalic and atraumatic.  Sneezing  Neck: Normal range of motion. Neck supple.  Cardiovascular: Normal rate, regular rhythm and normal heart sounds.   Pulmonary/Chest: Effort normal and breath sounds normal.  Abdominal: Soft. Bowel sounds are normal.  Musculoskeletal: Normal range of motion.  Neurological:  Moving  all extremities, but not to command. Appears confused. Speaking gibberish.   Skin: Skin is warm and dry.    ED Course  Procedures (including critical care time)  DIAGNOSTIC STUDIES: Oxygen Saturation is 98% on room air, normal by my interpretation.    COORDINATION OF CARE:  9:29 PM- Discussed treatment plan with patient's family, and they agreed to the plan. The plan includes lab work. Will call a code stroke.   Results for orders placed during the hospital encounter of 05/03/14  CBC WITH DIFFERENTIAL      Result Value Ref Range   WBC 6.4  4.0 - 10.5 K/uL   RBC 4.22  3.87 - 5.11 MIL/uL    Hemoglobin 12.9  12.0 - 15.0 g/dL   HCT 37.9  36.0 - 46.0 %   MCV 89.8  78.0 - 100.0 fL   MCH 30.6  26.0 - 34.0 pg   MCHC 34.0  30.0 - 36.0 g/dL   RDW 14.4  11.5 - 15.5 %   Platelets 265  150 - 400 K/uL   Neutrophils Relative % 44  43 - 77 %   Neutro Abs 2.8  1.7 - 7.7 K/uL   Lymphocytes Relative 44  12 - 46 %   Lymphs Abs 2.9  0.7 - 4.0 K/uL   Monocytes Relative 10  3 - 12 %   Monocytes Absolute 0.7  0.1 - 1.0 K/uL   Eosinophils Relative 1  0 - 5 %   Eosinophils Absolute 0.1  0.0 - 0.7 K/uL   Basophils Relative 1  0 - 1 %   Basophils Absolute 0.0  0.0 - 0.1 K/uL  BASIC METABOLIC PANEL      Result Value Ref Range   Sodium 138  137 - 147 mEq/L   Potassium 4.5  3.7 - 5.3 mEq/L   Chloride 98  96 - 112 mEq/L   CO2 26  19 - 32 mEq/L   Glucose, Bld 114 (*) 70 - 99 mg/dL   BUN 10  6 - 23 mg/dL   Creatinine, Ser 0.70  0.50 - 1.10 mg/dL   Calcium 9.6  8.4 - 10.5 mg/dL   GFR calc non Af Amer 88 (*) >90 mL/min   GFR calc Af Amer >90  >90 mL/min   Anion gap 14  5 - 15  PROTIME-INR      Result Value Ref Range   Prothrombin Time 13.5  11.6 - 15.2 seconds   INR 1.03  0.00 - 1.49  APTT      Result Value Ref Range   aPTT 27  24 - 37 seconds  ETHANOL      Result Value Ref Range   Alcohol, Ethyl (B) <11  0 - 11 mg/dL  I-STAT CHEM 8, ED      Result Value Ref Range   Sodium 135 (*) 137 - 147 mEq/L   Potassium 4.4  3.7 - 5.3 mEq/L   Chloride 105  96 - 112 mEq/L   BUN 9  6 - 23 mg/dL   Creatinine, Ser 0.80  0.50 - 1.10 mg/dL   Glucose, Bld 115 (*) 70 - 99 mg/dL   Calcium, Ion 1.18  1.13 - 1.30 mmol/L   TCO2 28  0 - 100 mmol/L   Hemoglobin 13.9  12.0 - 15.0 g/dL   HCT 41.0  36.0 - 46.0 %  I-STAT TROPOININ, ED      Result Value Ref Range   Troponin i, poc 0.00  0.00 - 0.08  ng/mL   Comment 3            Imaging Review Ct Head Wo Contrast  05/03/2014   CLINICAL DATA:  Code stroke, altered mental status, slurred speech  EXAM: CT HEAD WITHOUT CONTRAST  TECHNIQUE: Contiguous axial  images were obtained from the base of the skull through the vertex without intravenous contrast.  COMPARISON:  None.  FINDINGS: Severely motion degraded images.  Hypodensity in the left basal ganglia is worrisome for acute/subacute infarct.  No evidence of parenchymal hemorrhage or extra-axial fluid collection.  No mass lesion, mass effect, or midline shift.  Dilatation of the occipital horns of the lateral ventricles.  The visualized paranasal sinuses are essentially clear. The mastoid air cells are unopacified.  No evidence of calvarial fracture.  IMPRESSION: Severely motion degraded images.  Hypodensity in the left basal ganglia is worrisome for acute/subacute infarct.  These results were called by telephone at the time of interpretation on 05/03/2014 at 10:18 pm to Dr. Nat Christen , who verbally acknowledged these results.   Electronically Signed   By: Julian Hy M.D.   On: 05/03/2014 22:19     EKG Interpretation   Date/Time:  Sunday May 03 2014 21:10:31 EDT Ventricular Rate:  92 PR Interval:  147 QRS Duration: 79 QT Interval:  350 QTC Calculation: 433 R Axis:   -35 Text Interpretation:  Sinus rhythm Left axis deviation Borderline low  voltage, extremity leads Nonspecific T abnormalities, lateral leads  Confirmed by Fredrico Beedle  MD, Aayla Marrocco ([id number]) on 05/03/2014 9:42:04 PM     CRITICAL CARE Performed by: Nat Christen Total critical care time:45 Critical care time was exclusive of separately billable procedures and treating other patients. Critical care was necessary to treat or prevent imminent or life-threatening deterioration. Critical care was time spent personally by me on the following activities: development of treatment plan with patient and/or surrogate as well as nursing, discussions with consultants, evaluation of patient's response to treatment, examination of patient, obtaining history from patient or surrogate, ordering and performing treatments and interventions, ordering and  review of laboratory studies, ordering and review of radiographic studies, pulse oximetry and re-evaluation of patient's condition. MDM   Final diagnoses:  Cerebral infarction due to unspecified mechanism    Code stroke initiated immediately after evaluation of patient.  CT scan reveals a acute/subacute left basal ganglia infarct.  Discussed with tele-neurologist.  Thrombolytics were not indicated secondary to prolonged time frame. All these issues were discussed with the sister, husband.  Questions were entertained and answered. Rectal aspirin given    I personally performed the services described in this documentation, which was scribed in my presence. The recorded information has been reviewed and is accurate.    Nat Christen, MD 05/03/14 [phone]

## 2014-05-03 NOTE — ED Notes (Signed)
Patient back from ct

## 2014-05-03 NOTE — H&P (Signed)
PCP:   Maricela Curet, MD   Chief Complaint:  Altered mental status  HPI: 68 year old female with a history of stroke, seizures who was brought to the ED today after patient developed sudden altered mental status around 6 PM. As per patient's husband she spent hold at church and she was fine and on 6 PM he noticed that patient became confused though she had normal speech behavior and mobility. She has a history of stroke in the past in 2009 which had impaired her left hand. Patient developed seizure after the stroke and for that she takes phenobarbital. Tele neurology was consulted by the ED physician, and after evaluation did not recommend TPA as patient was out of the window. Patient also failed swallow evaluation, so no by mouth aspirin or Plavix could be given. Patient received per rectum aspirin 325 mg in the ED. At home she takes aspirin 81 mg by mouth daily. Patient is unable to provide any significant history  Allergies:   Allergies  Allergen Reactions  . Codeine       Past Medical History  Diagnosis Date  . Stroke   . Seizures   . Hypertension     Past Surgical History  Procedure Laterality Date  . Peg tube placement    . Peg tube removal      Prior to Admission medications   Medication Sig Start Date End Date Taking? Authorizing Provider  amLODipine-benazepril (LOTREL) 5-10 MG per capsule Take 1 capsule by mouth daily.   Yes Historical Provider, MD  atorvastatin (LIPITOR) 40 MG tablet Take 40 mg by mouth daily.   Yes Historical Provider, MD  FLUoxetine (PROZAC) 10 MG capsule Take 10 mg by mouth daily.   Yes Historical Provider, MD  labetalol (NORMODYNE) 100 MG tablet Take 100 mg by mouth 2 (two) times daily.   Yes Historical Provider, MD  levothyroxine (SYNTHROID, LEVOTHROID) 25 MCG tablet Take 25 mcg by mouth daily before breakfast.   Yes Historical Provider, MD  PHENobarbital (LUMINAL) 64.8 MG tablet Take 64.8 mg by mouth at bedtime.   Yes Historical  Provider, MD    Social History:  reports that she has been smoking Cigarettes.  She has been smoking about 0.00 packs per day. She does not have any smokeless tobacco history on file. She reports that she does not drink alcohol or use illicit drugs.  History reviewed. No pertinent family history.   All the positives are listed in BOLD  Review of Systems:  HEENT: Headache, blurred vision, runny nose, sore throat Neck: Hypothyroidism, hyperthyroidism,,lymphadenopathy Chest : Shortness of breath, history of COPD, Asthma Heart : Chest pain, history of coronary arterey disease GI:  Nausea, vomiting, diarrhea, constipation, GERD GU: Dysuria, urgency, frequency of urination, hematuria Neuro: Stroke, seizures, syncope Psych: Depression, anxiety, hallucinations   Physical Exam: Blood pressure 164/124, pulse 85, temperature 98.1 F (36.7 C), temperature source Oral, resp. rate 17, height 5\' 5"  (1.651 m), weight 49.896 kg (110 lb), SpO2 100.00%. Constitutional:   Patient is a malnourished appearing female * in no acute distress and cooperative with exam. Head: Normocephalic and atraumatic Mouth: Mucus membranes moist Eyes: PERRL, EOMI, conjunctivae normal Neck: Supple, No Thyromegaly Cardiovascular: RRR, S1 normal, S2 normal Pulmonary/Chest: CTAB, no wheezes, rales, or rhonchi Abdominal: Soft. Non-tender, non-distended, bowel sounds are normal, no masses, organomegaly, or guarding present.  Neurological: Alert but not oriented x3, right side neglect, moving left upper and lower extremity , unable to assess the right upper and lower extremity as patient  not following commands on the right side.  Extremities : No Cyanosis, Clubbing or Edema  Labs on Admission:  Basic Metabolic Panel:  Recent Labs Lab 05/03/14 2115 05/03/14 2143  NA 138 135*  K 4.5 4.4  CL 98 105  CO2 26  --   GLUCOSE 114* 115*  BUN 10 9  CREATININE 0.70 0.80  CALCIUM 9.6  --    Liver Function Tests: No results  found for this basename: AST, ALT, ALKPHOS, BILITOT, PROT, ALBUMIN,  in the last 168 hours No results found for this basename: LIPASE, AMYLASE,  in the last 168 hours No results found for this basename: AMMONIA,  in the last 168 hours CBC:  Recent Labs Lab 05/03/14 2115 05/03/14 2143  WBC 6.4  --   NEUTROABS 2.8  --   HGB 12.9 13.9  HCT 37.9 41.0  MCV 89.8  --   PLT 265  --    Cardiac Enzymes: No results found for this basename: CKTOTAL, CKMB, CKMBINDEX, TROPONINI,  in the last 168 hours  BNP (last 3 results) No results found for this basename: PROBNP,  in the last 8760 hours CBG: No results found for this basename: GLUCAP,  in the last 168 hours  Radiological Exams on Admission: Ct Head Wo Contrast  05/03/2014   CLINICAL DATA:  Code stroke, altered mental status, slurred speech  EXAM: CT HEAD WITHOUT CONTRAST  TECHNIQUE: Contiguous axial images were obtained from the base of the skull through the vertex without intravenous contrast.  COMPARISON:  None.  FINDINGS: Severely motion degraded images.  Hypodensity in the left basal ganglia is worrisome for acute/subacute infarct.  No evidence of parenchymal hemorrhage or extra-axial fluid collection.  No mass lesion, mass effect, or midline shift.  Dilatation of the occipital horns of the lateral ventricles.  The visualized paranasal sinuses are essentially clear. The mastoid air cells are unopacified.  No evidence of calvarial fracture.  IMPRESSION: Severely motion degraded images.  Hypodensity in the left basal ganglia is worrisome for acute/subacute infarct.  These results were called by telephone at the time of interpretation on 05/03/2014 at 10:18 pm to Dr. Nat Christen , who verbally acknowledged these results.   Electronically Signed   By: Julian Hy M.D.   On: 05/03/2014 22:19    EKG: Independently reviewed. Sinus rhythm   Assessment/Plan Active Problems:   CVA (cerebral infarction)  CVA Patient is presenting with altered  mental status, and weakness of right side. CT head shows hypodensity in the left basal ganglia worrisome for acute/subacute infarct. We'll continue aspirin 32 5 mg daily. We'll obtain MRI/MRA brain, 2-D echo, carotid ultrasound in a.m. and neurology consultation. We'll keep the patient n.p.o. as she has failed the swallow evaluation.  Seizure disorder Will change to by mouth phenobarbital to IV phenobarbital 65 mg each bedtime for seizure disorder   Code status: Full code  Family discussion: Admission, patients condition and plan of care including tests being ordered have been discussed with the patient and *her husband at bedside who indicate understanding and agree with the plan and Code Status.   Time Spent on Admission: 65 minutes  [facility] Pager: [phone] 05/03/2014, 11:58 PM  If 7PM-7AM, please contact night-coverage  [email or web address]  Password [email or web address]

## 2014-05-03 NOTE — ED Notes (Signed)
Family at bedside. Patient very confused and agitated  Staff and family. Verbally upset also.

## 2014-05-03 NOTE — ED Provider Notes (Signed)
23:30    discussed blood pressure management with tele-neurologist.  He recommended treating blood pressure ONLY if it exceeded 220/120  Nat Christen, MD 05/03/14 [phone]

## 2014-05-03 NOTE — ED Notes (Signed)
Husband states pt started yelling out for 45 minutes, which is not pt's usual behavior

## 2014-05-04 ENCOUNTER — Inpatient Hospital Stay (HOSPITAL_COMMUNITY): Payer: Medicare HMO

## 2014-05-04 ENCOUNTER — Encounter (HOSPITAL_COMMUNITY): Payer: Self-pay | Admitting: *Deleted

## 2014-05-04 DIAGNOSIS — I517 Cardiomegaly: Secondary | ICD-10-CM

## 2014-05-04 LAB — URINALYSIS, ROUTINE W REFLEX MICROSCOPIC
BILIRUBIN URINE: NEGATIVE
Glucose, UA: NEGATIVE mg/dL
Leukocytes, UA: NEGATIVE
NITRITE: NEGATIVE
Protein, ur: NEGATIVE mg/dL
Specific Gravity, Urine: 1.01 (ref 1.005–1.030)
Urobilinogen, UA: 0.2 mg/dL (ref 0.0–1.0)
pH: 6.5 (ref 5.0–8.0)

## 2014-05-04 LAB — RAPID URINE DRUG SCREEN, HOSP PERFORMED
AMPHETAMINES: NOT DETECTED
BARBITURATES: POSITIVE — AB
BENZODIAZEPINES: NOT DETECTED
Cocaine: NOT DETECTED
Opiates: NOT DETECTED
Tetrahydrocannabinol: NOT DETECTED

## 2014-05-04 LAB — URINE MICROSCOPIC-ADD ON

## 2014-05-04 LAB — LIPID PANEL
Cholesterol: 190 mg/dL (ref 0–200)
HDL: 99 mg/dL (ref >39)
LDL CALC: 81 mg/dL (ref 0–99)
TRIGLYCERIDES: 48 mg/dL (ref <150)
Total CHOL/HDL Ratio: 1.9 RATIO
VLDL: 10 mg/dL (ref 0–40)

## 2014-05-04 LAB — CBC
HEMATOCRIT: 36.2 % (ref 36.0–46.0)
Hemoglobin: 12.3 g/dL (ref 12.0–15.0)
MCH: 30.4 pg (ref 26.0–34.0)
MCHC: 34 g/dL (ref 30.0–36.0)
MCV: 89.6 fL (ref 78.0–100.0)
PLATELETS: 243 10*3/uL (ref 150–400)
RBC: 4.04 MIL/uL (ref 3.87–5.11)
RDW: 14.2 % (ref 11.5–15.5)
WBC: 7.4 10*3/uL (ref 4.0–10.5)

## 2014-05-04 LAB — HEMOGLOBIN A1C
Hgb A1c MFr Bld: 6.2 % — ABNORMAL HIGH (ref <5.7)
Mean Plasma Glucose: 131 mg/dL — ABNORMAL HIGH (ref <117)

## 2014-05-04 LAB — SEDIMENTATION RATE: Sed Rate: 17 mm/hr (ref 0–22)

## 2014-05-04 LAB — TSH: TSH: 4.35 u[IU]/mL (ref 0.350–4.500)

## 2014-05-04 LAB — MRSA PCR SCREENING: MRSA by PCR: NEGATIVE

## 2014-05-04 LAB — PHENOBARBITAL LEVEL: PHENOBARBITAL: 14.1 ug/mL — AB (ref 15.0–40.0)

## 2014-05-04 MED ORDER — STROKE: EARLY STAGES OF RECOVERY BOOK
Freq: Once | Status: AC
  Administered 2014-05-04: 12:00:00
  Filled 2014-05-04: qty 1

## 2014-05-04 MED ORDER — LEVOTHYROXINE SODIUM 25 MCG PO TABS
25.0000 ug | ORAL_TABLET | Freq: Every day | ORAL | Status: DC
  Administered 2014-05-04 – 2014-05-06 (×3): 25 ug via ORAL
  Filled 2014-05-04 (×3): qty 1

## 2014-05-04 MED ORDER — FLUOXETINE HCL 10 MG PO CAPS
10.0000 mg | ORAL_CAPSULE | Freq: Every day | ORAL | Status: DC
  Administered 2014-05-05 – 2014-05-06 (×2): 10 mg via ORAL
  Filled 2014-05-04 (×5): qty 1

## 2014-05-04 MED ORDER — LORAZEPAM 2 MG/ML IJ SOLN: 2.0000 mg | INTRAMUSCULAR | Status: DC | PRN

## 2014-05-04 MED ORDER — DEXAMETHASONE SODIUM PHOSPHATE 10 MG/ML IJ SOLN
10.0000 mg | Freq: Two times a day (BID) | INTRAMUSCULAR | Status: AC
  Administered 2014-05-04: 10 mg via INTRAVENOUS
  Filled 2014-05-04 (×2): qty 1

## 2014-05-04 MED ORDER — ATORVASTATIN CALCIUM 40 MG PO TABS
40.0000 mg | ORAL_TABLET | Freq: Every day | ORAL | Status: DC
  Administered 2014-05-05 – 2014-05-06 (×2): 40 mg via ORAL
  Filled 2014-05-04 (×3): qty 1

## 2014-05-04 MED ORDER — ENOXAPARIN SODIUM 40 MG/0.4ML ~~LOC~~ SOLN
40.0000 mg | SUBCUTANEOUS | Status: DC
  Administered 2014-05-04 – 2014-05-06 (×3): 40 mg via SUBCUTANEOUS
  Filled 2014-05-04 (×3): qty 0.4

## 2014-05-04 MED ORDER — ASPIRIN 300 MG RE SUPP
300.0000 mg | Freq: Every day | RECTAL | Status: DC
  Administered 2014-05-04: 300 mg via RECTAL
  Filled 2014-05-04: qty 1

## 2014-05-04 MED ORDER — LABETALOL HCL 5 MG/ML IV SOLN: 10.0000 mg | INTRAVENOUS | Status: DC | PRN

## 2014-05-04 MED ORDER — DEXAMETHASONE 4 MG PO TABS
6.0000 mg | ORAL_TABLET | Freq: Every day | ORAL | Status: DC
  Administered 2014-05-05 – 2014-05-06 (×2): 6 mg via ORAL
  Filled 2014-05-04 (×3): qty 2

## 2014-05-04 MED ORDER — PHENOBARBITAL SODIUM 130 MG/ML IJ SOLN
65.0000 mg | Freq: Every day | INTRAMUSCULAR | Status: DC
  Administered 2014-05-04: 65 mg via INTRAVENOUS
  Filled 2014-05-04: qty 1

## 2014-05-04 MED ORDER — ASPIRIN 325 MG PO TABS
325.0000 mg | ORAL_TABLET | Freq: Every day | ORAL | Status: DC
  Administered 2014-05-05 – 2014-05-06 (×2): 325 mg via ORAL
  Filled 2014-05-04 (×2): qty 1

## 2014-05-04 MED ORDER — DEXAMETHASONE SODIUM PHOSPHATE 10 MG/ML IJ SOLN
INTRAMUSCULAR | Status: AC
  Filled 2014-05-04: qty 1

## 2014-05-04 MED ORDER — INFLUENZA VAC SPLIT QUAD 0.5 ML IM SUSY
0.5000 mL | PREFILLED_SYRINGE | INTRAMUSCULAR | Status: AC
  Administered 2014-05-05: 0.5 mL via INTRAMUSCULAR
  Filled 2014-05-04: qty 0.5

## 2014-05-04 MED ORDER — CETYLPYRIDINIUM CHLORIDE 0.05 % MT LIQD
7.0000 mL | Freq: Two times a day (BID) | OROMUCOSAL | Status: DC
  Administered 2014-05-04 – 2014-05-06 (×5): 7 mL via OROMUCOSAL

## 2014-05-04 NOTE — Evaluation (Signed)
Clinical/Bedside Swallow Evaluation Patient Details  Name: [name] MRN: [id number] Date of Birth: 11-04-1945  Today's Date: 05/04/2014 Time: 5277-8242 SLP Time Calculation (min): 21 min  Past Medical History:  Past Medical History  Diagnosis Date  . Seizures   . Hypertension   . Stroke 2009, 2015 9/27   Past Surgical History:  Past Surgical History  Procedure Laterality Date  . Peg tube placement    . Peg tube removal     HPI:  Pt is a 68 year old female who was admitted with a sudden change in mental status and right sided weakness.  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  Per chart, pt has a history of seizure.  She is severely HOH.  Per family report in chart, pt is normally able to ambulate and transfer at home without difficulty.    Assessment / Plan / Recommendation Clinical Impression  Pt seen at bedside for clinical swallow evaluation. She is HOH and benefited from visual cues and concise, loud speech. Oral motor exam was unremarkable except for edentulous status (upper dentures later found in room, but not worn during po trials). Pt showed no overt signs or symptoms of aspiration with ice chips, thin water, puree, or mech soft, however pt did have deficits in oral planning and coordination. When given apple sauce, she tried drinking it from the spoon and experienced labial spillage with poor awareness of the same. Pt verbally cued to "open your mouth...wide" and demonstrated improved oral control of bolus. She did verbalize that it was easier for her to drink than eat and stated, "they said I had a stroke". No family present and pt extremely HOH so it was difficult to determine baseline cognition.   Recommend initiating diet of puree textures/D1 with thin liquids with 100% feeder assist due to cognitive status. Pt had some difficulty switching tasks from drinking to eating, but responded well to verbal cues. OK for po meds whole in applesauce. SLP will  follow up tomorrow for diet tolerance and upgrades as appropriate.    Aspiration Risk  Mild    Diet Recommendation Dysphagia 1 (Puree);Thin liquid   Liquid Administration via: Straw Medication Administration: Whole meds with puree Supervision: Staff to assist with self feeding Compensations: Check for pocketing (pt will have difficulty alternating between drinking/eating) Postural Changes and/or Swallow Maneuvers: Out of bed for meals;Seated upright 90 degrees;Upright 30-60 min after meal    Other  Recommendations Oral Care Recommendations: Oral care BID Other Recommendations: Clarify dietary restrictions   Follow Up Recommendations  Skilled Nursing facility    Frequency and Duration min 2x/week  2 weeks   Pertinent Vitals/Pain VSS    SLP Swallow Goals   Pt will demonstrate safe and efficient consumption of least restrictive diet with use of strategies as needed.   Swallow Study Prior Functional Status  Available Help at Discharge: Family    General Date of Onset: 05/03/14 HPI: Pt is a 68 year old female who was admitted with a sudden change in mental status and right sided weakness.  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  Per chart, pt has a history of seizure.  She is severely HOH.  Per family report in chart, pt is normally able to ambulate and transfer at home without difficulty.  Type of Study: Bedside swallow evaluation Previous Swallow Assessment: Did not pass RN swallow screen Diet Prior to this Study: NPO Temperature Spikes Noted: No Respiratory  Status: Room air History of Recent Intubation: No Behavior/Cognition: Alert;Cooperative;Requires cueing;Hard of hearing Oral Cavity - Dentition: Edentulous (has upper dentures nut did not wear) Self-Feeding Abilities: Total assist (due to visual/motor planning??) Patient Positioning: Upright in bed Baseline Vocal Quality: Clear Volitional Cough: Strong Volitional Swallow: Able to elicit     Oral/Motor/Sensory Function Overall Oral Motor/Sensory Function: Appears within functional limits for tasks assessed   Ice Chips Ice chips: Impaired Presentation: Spoon Oral Phase Impairments: Reduced labial seal;Reduced lingual movement/coordination;Impaired anterior to posterior transit;Poor awareness of bolus Oral Phase Functional Implications: Left anterior spillage;Prolonged oral transit;Oral holding   Thin Liquid Thin Liquid: Within functional limits Presentation: Cup;Straw    Nectar Thick Nectar Thick Liquid: Not tested   Honey Thick Honey Thick Liquid: Not tested   Puree Puree: Impaired Presentation: Spoon;Self Fed Oral Phase Impairments: Reduced labial seal;Reduced lingual movement/coordination;Impaired anterior to posterior transit Oral Phase Functional Implications: Oral holding   Solid       Solid: Impaired Presentation: Spoon Oral Phase Impairments: Reduced lingual movement/coordination;Impaired mastication Oral Phase Functional Implications:  (swallowed without masticating)      Thank you,  Genene Churn, [place] 05/04/2014,1:49 PM

## 2014-05-04 NOTE — Progress Notes (Signed)
Patient became fearful yesterday. Asking her what is going on 13 are retracted recognizing family members. Lower the seizure activity. 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 Dementia workup in progress  [name] AVW:[id number] DOB: 08/25/1945 DOA: 05/03/2014 PCP: Maricela Curet, MD             Physical Exam: Blood pressure 107/72, pulse 74, temperature 98.6 F (37 C), temperature source Oral, resp. rate 20, height 5\' 5"  (1.651 m), weight 110 lb 14.3 oz (50.3 kg), SpO2 97.00%. alert and oriented x3 lungs no rales wheeze or rhonchi appreciable. Heart regular rhythm no S3-S4 no heaves thrills or rubs. Abdomen soft nontender bowel sounds normoactive   Investigations:  Recent Results (from the past 240 hour(s))  MRSA PCR SCREENING     Status: None   Collection Time    05/04/14  1:33 AM      Result Value Ref Range Status   MRSA by PCR NEGATIVE  NEGATIVE Final   Comment:            The GeneXpert MRSA Assay (FDA     approved for NASAL specimens     only), is one component of a     comprehensive MRSA colonization     surveillance program. It is not     intended to diagnose MRSA     infection nor to guide or     monitor treatment for     MRSA infections.     Basic Metabolic Panel:  Recent Labs  05/03/14 2115 05/03/14 2143  NA 138 135*  K 4.5 4.4  CL 98 105  CO2 26  --   GLUCOSE 114* 115*  BUN 10 9  CREATININE 0.70 0.80  CALCIUM 9.6  --    Liver Function Tests: No results found for this basename: AST, ALT, ALKPHOS, BILITOT, PROT, ALBUMIN,  in the last 72 hours   CBC:  Recent Labs  05/03/14 2115 05/03/14 2143  WBC 6.4  --   NEUTROABS 2.8  --   HGB 12.9 13.9  HCT 37.9 41.0  MCV 89.8  --   PLT 265  --     Dg Chest 1 View  05/04/2014   CLINICAL DATA:  Stroke symptoms  EXAM: CHEST - 1 VIEW  COMPARISON:  None.  FINDINGS: The heart size and mediastinal contours are within normal limits. Both  lungs are clear. The visualized skeletal structures are unremarkable.  IMPRESSION: No active disease.   Electronically Signed   By: [name] M.D.   On: 05/04/2014 10:10   Ct Head Wo Contrast  05/03/2014   CLINICAL DATA:  Code stroke, altered mental status, slurred speech  EXAM: CT HEAD WITHOUT CONTRAST  TECHNIQUE: Contiguous axial images were obtained from the base of the skull through the vertex without intravenous contrast.  COMPARISON:  None.  FINDINGS: Severely motion degraded images.  Hypodensity in the left basal ganglia is worrisome for acute/subacute infarct.  No evidence of parenchymal hemorrhage or extra-axial fluid collection.  No mass lesion, mass effect, or midline shift.  Dilatation of the occipital horns of the lateral ventricles.  The visualized paranasal sinuses are essentially clear. The mastoid air cells are unopacified.  No evidence of calvarial fracture.  IMPRESSION: Severely motion degraded images.  Hypodensity in the left basal ganglia is worrisome for acute/subacute infarct.  These results were called by telephone at the time of interpretation on 05/03/2014 at 10:18 pm to Dr. Nat [name] , who  verbally acknowledged these results.   Electronically Signed   By: [name] M.D.   On: 05/03/2014 22:19   Mr [name] Wo Contrast  05/04/2014   CLINICAL DATA:  New of new 68 year old female with altered mental status. History of seizures, strokes and high blood pressure.  EXAM: MRI HEAD WITHOUT CONTRAST  MRA HEAD WITHOUT CONTRAST  TECHNIQUE: Multiplanar, multiecho pulse sequences of the brain and surrounding structures were obtained without intravenous contrast. Angiographic images of the head were obtained using MRA technique without contrast.  COMPARISON:  05/03/2014 CT.  FINDINGS: MRI HEAD FINDINGS  Exam is motion degraded.  No acute infarct.  Remote hemorrhagic infarcts right parietal-occipital lobe and left parietal lobe with encephalomalacia and subsequent dilation of the atrium  of the right lateral ventricle. Remote infarct with encephalomalacia left lenticular nucleus.  Tiny blood breakdown products right pons probably related to prior episode of hemorrhagic ischemia. Small cavernoma felt to be less likely consideration.  Left frontal opercular region 2.7 x 2.7 x 2.3 cm extra-axial mass most consistent with a meningioma with vasogenic edema within the left frontal lobe and superior aspect of the left basal ganglia.  Baseline atrophy without hydrocephalus.  Partially empty non expanded sella.  Major intracranial vascular structures are patent.  Minimal mucosal thickening mastoid air cells and ethmoid sinus air cells.  Cervical medullary junction, pineal region and orbital structures grossly within normal limits.  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  Prominent infundibulum left posterior communicating artery origin region.  Mild narrowing and irregularity of the cavernous segment of the internal carotid artery greater on the left.  Middle cerebral artery branch vessel moderate narrowing and irregularity greater on the left.  Ectatic vertebral arteries and basilar artery. Mild narrowing portions of the distal right vertebral artery without high-grade stenosis.  Nonvisualized left anterior inferior cerebellar artery.  Moderate to marked narrowing mid to distal aspect of the posterior cerebral artery bilaterally.  IMPRESSION: MRI HEAD:  Exam is motion degraded.  Left frontal opercular region 2.7 x 2.7 x 2.3 cm extra-axial mass most consistent with a meningioma with vasogenic edema within the left frontal lobe and superior aspect of the left basal ganglia.  No acute infarct.  Remote hemorrhagic infarcts right parietal-occipital lobe and left parietal lobe with encephalomalacia and subsequent dilation of the atrium of the right lateral ventricle. Remote infarct with encephalomalacia left lenticular nucleus.  Tiny blood breakdown products right  pons probably related to prior episode of hemorrhagic ischemia. Small cavernoma felt to be less likely consideration.  Baseline atrophy without hydrocephalus.  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  Prominent infundibulum left posterior communicating artery origin region.  Mild narrowing and irregularity of the cavernous segment of the internal carotid artery greater on the left.  Middle cerebral artery branch vessel moderate narrowing and irregularity greater on the left.  Ectatic vertebral arteries and basilar artery. Mild narrowing portions of the distal right vertebral artery without high-grade stenosis.  Nonvisualized left anterior inferior cerebellar artery.  Moderate to marked narrowing mid to distal aspect of the posterior cerebral artery bilaterally.  These results will be called to the ordering clinician or representative by the Radiologist Assistant, and communication documented in the PACS or zVision Dashboard.   Electronically Signed   By: [name] M.D.   On: 05/04/2014 11:29   Mr Brain Wo Contrast  05/04/2014   CLINICAL DATA:  New of new 68 year old female with altered mental status. History of seizures, strokes and high blood pressure.  EXAM: MRI HEAD WITHOUT CONTRAST  MRA HEAD WITHOUT CONTRAST  TECHNIQUE: Multiplanar, multiecho pulse sequences of the brain and surrounding structures were obtained without intravenous contrast. Angiographic images of the head were obtained using MRA technique without contrast.  COMPARISON:  05/03/2014 CT.  FINDINGS: MRI HEAD FINDINGS  Exam is motion degraded.  No acute infarct.  Remote hemorrhagic infarcts right parietal-occipital lobe and left parietal lobe with encephalomalacia and subsequent dilation of the atrium of the right lateral ventricle. Remote infarct with encephalomalacia left lenticular nucleus.  Tiny blood breakdown products right pons probably related to prior episode of hemorrhagic ischemia. Small  cavernoma felt to be less likely consideration.  Left frontal opercular region 2.7 x 2.7 x 2.3 cm extra-axial mass most consistent with a meningioma with vasogenic edema within the left frontal lobe and superior aspect of the left basal ganglia.  Baseline atrophy without hydrocephalus.  Partially empty non expanded sella.  Major intracranial vascular structures are patent.  Minimal mucosal thickening mastoid air cells and ethmoid sinus air cells.  Cervical medullary junction, pineal region and orbital structures grossly within normal limits.  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  Prominent infundibulum left posterior communicating artery origin region.  Mild narrowing and irregularity of the cavernous segment of the internal carotid artery greater on the left.  Middle cerebral artery branch vessel moderate narrowing and irregularity greater on the left.  Ectatic vertebral arteries and basilar artery. Mild narrowing portions of the distal right vertebral artery without high-grade stenosis.  Nonvisualized left anterior inferior cerebellar artery.  Moderate to marked narrowing mid to distal aspect of the posterior cerebral artery bilaterally.  IMPRESSION: MRI HEAD:  Exam is motion degraded.  Left frontal opercular region 2.7 x 2.7 x 2.3 cm extra-axial mass most consistent with a meningioma with vasogenic edema within the left frontal lobe and superior aspect of the left basal ganglia.  No acute infarct.  Remote hemorrhagic infarcts right parietal-occipital lobe and left parietal lobe with encephalomalacia and subsequent dilation of the atrium of the right lateral ventricle. Remote infarct with encephalomalacia left lenticular nucleus.  Tiny blood breakdown products right pons probably related to prior episode of hemorrhagic ischemia. Small cavernoma felt to be less likely consideration.  Baseline atrophy without hydrocephalus.  MRA HEAD:  2.3 mm aneurysm/ bulge at the  origin of the left ophthalmic artery.  Prominent infundibulum left posterior communicating artery origin region.  Mild narrowing and irregularity of the cavernous segment of the internal carotid artery greater on the left.  Middle cerebral artery branch vessel moderate narrowing and irregularity greater on the left.  Ectatic vertebral arteries and basilar artery. Mild narrowing portions of the distal right vertebral artery without high-grade stenosis.  Nonvisualized left anterior inferior cerebellar artery.  Moderate to marked narrowing mid to distal aspect of the posterior cerebral artery bilaterally.  These results will be called to the ordering clinician or representative by the Radiologist Assistant, and communication documented in the PACS or zVision Dashboard.   Electronically Signed   By: [name] M.D.   On: 05/04/2014 11:29   US Carotid Bilateral  05/04/2014   CLINICAL DATA:  68 year old female with a history of stroke  EXAM: BILATERAL CAROTID DUPLEX ULTRASOUND  TECHNIQUE: Pearline Cables scale imaging, color Doppler and duplex ultrasound were performed of bilateral carotid and vertebral arteries in the neck.  COMPARISON:  No prior duplex for comparison. CT of the brain 05/03/2014. MRI completed on the same date.  FINDINGS: Criteria: Quantification of carotid stenosis is based on velocity parameters that correlate the residual internal carotid diameter with NASCET-based stenosis levels, using the diameter of the distal internal carotid lumen as the denominator for stenosis measurement.  The following velocity measurements were obtained:  RIGHT  ICA:  Systolic 69 cm/sec, Diastolic 21 cm/sec  CCA:  49 cm/sec  SYSTOLIC ICA/CCA RATIO:  1.4  DIASTOLIC ICA/CCA RATIO:  1.8  ECA:  57 cm/sec  LEFT  ICA:  Systolic 54 cm/sec, Diastolic 20 cm/sec  CCA:  58 cm/sec  SYSTOLIC ICA/CCA RATIO:  8.52  DIASTOLIC ICA/CCA RATIO:  1.6  ECA:  80 cm/sec  RIGHT CAROTID ARTERY: Changes of atherosclerosis involving the right common carotid  artery and carotid bulb with mild -moderate calcifications. Intermediate waveform maintained within the common carotid artery. Right internal carotid artery demonstrates heterogeneous plaque with associated calcifications. Low resistance waveform maintained.  RIGHT VERTEBRAL ARTERY:  Antegrade flow with low resistance waveform  LEFT CAROTID ARTERY: Atherosclerotic changes of the left common carotid artery, carotid bulb, and internal carotid artery. Waveforms within normal limits. No significant calcifications.  LEFT VERTEBRAL ARTERY:  Antegrade flow with low resistance waveform.  IMPRESSION: Moderate heterogeneous and partially calcified plaque of the bilateral extracranial cerebral vascular circulation without significant stenosis identified by duplex criteria.  Signed,  [name]. Brandi Newport, DO  [facility]  [facility]   Electronically Signed   By: Corrie Mckusick O.D.   On: 05/04/2014 11:34      Medications:  Im altered mental status. Menhingioma. Left opthalmic artery aneurism   Active Problems:   CVA (cerebral infarction)     Plan: Dementia workup progress.   Consultants: Neurology.    Procedures   Antibiotics:                   Code Status: full   Family Communication:  I spoke with cystic bedside.  Disposition Plan  Time spent: 30 minutes   LOS: 1 day   Latice Waitman M   05/04/2014, 12:32 PM

## 2014-05-04 NOTE — Plan of Care (Signed)
Problem: Acute Treatment Outcomes Goal: Neuro exam at baseline or improved Outcome: Not Progressing Patient remains confused with dysphasia, moves all extremities and tracks with eyes, unable to follow commands and hard of hearing, having to yell, vision seems intact as she called out neices name Goal: 02 Sats > 94% Outcome: Completed/Met Date Met:  05/04/14 Remains 100% on RA Goal: Prognosis discussed with family/patient as appropriate Outcome: Completed/Met Date Met:  05/04/14 Family and MD discussed care plan in ED

## 2014-05-04 NOTE — Progress Notes (Signed)
  Echocardiogram 2D Echocardiogram has been performed.  [place], [place] 05/04/2014, 11:45 AM

## 2014-05-04 NOTE — Consult Note (Signed)
Brandi A. [name], MD     [email or web address]          [name] is an 67 y.o. female.   ASSESSMENT/PLAN:   1. Acute altered mental status Of unclear etiology. However, given the previous large vessel cortical infarcts and meningioma, seizure is suspected.  2. Moderate size left frontal extra-axial mass consistent with a meningioma. This is associated with the moderate vasogenic edema.  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  4. 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 ( Follow with repeat Brain CTA 12 months)    EEG. Phenobarbital level. Likely trial of low-dose steroids. Decadron or prednisone should be sufficient. We will consider outpatient neurosurgical consultation for the meningioma. Given the high likelihood of cardioembolic strokes, A 30 day event monitor/Holter monitor to evaluate for atrial fibrillation is suggested. MRI BRAIN W Contrast.     This is a 68 year old black female who presents with acute onset of altered mental status. The patient was quite unresponsive and confused but has improved since being hospitalized. The family reports that she is still not at baseline. She still has issues of having difficulty comprehending. Nurse also reports that she did have a lot of visual problems but this has improved. The family tells me that she had a stroke in 2007 which affected her left side. She had another one in 2009 also affecting the left side. Initial stroke affecting her left lower extremity and the second one affected the left upper extremity. The patient is still significantly confused and is having difficulties following commands and understanding the conversation. No complaints are reported in discussion this with the patient and her family.  GENERAL: This is a pleasant thin female in no acute distress.  HEENT: Supple. Atraumatic normocephalic. She has  significant hearing impairment.  ABDOMEN: soft  EXTREMITIES: No edema   BACK: Normal.  SKIN: Normal by inspection.    MENTAL STATUS: She is awake and alert. There is clear difficulties with comprehension. However, with much prompting she knows that she is in the hospital at [facility]. She is not oriented to time. Speech is mild to moderately dysarthric. She does follow commands with much prompting.  CRANIAL NERVES: Pupils are equal, round and reactive to light; extra ocular movements are full, there is no significant nystagmus; visual fields shows a dense right mod most hemianopia; upper and lower facial muscles are normal in strength and symmetric, there is no flattening of the nasolabial folds; tongue is midline.  MOTOR: There is difficulty ascertaining the exact strength as she seemed to have good tone, bulk and strength on the right side. There is also concern involving the left lower extremity. The left hand is markedly weak measured about 3/5 including hand grip and wrist extension. There is contracture of the left hand. The left upper extremity is also weak measured approximate 4 minus/5.  COORDINATION: Left finger to nose is normal, right finger to nose is normal, No rest tremor; no intention tremor; no postural tremor; no bradykinesia.  REFLEXES: Deep tendon reflexes are symmetrical and normal. Babinski reflexes are flexor bilaterally.   SENSATION: Seems normal to painful some light bilaterally.        Blood pressure 107/72, pulse 74, temperature 98.6 F (37 C), temperature source Oral, resp. rate 20, height 5' 5"  (1.651 m), weight 50.3 kg (110 lb 14.3 oz), SpO2 97.00%.  Past Medical History  Diagnosis Date  . Seizures   . Hypertension   .  Stroke 2009, 2015 9/27    Past Surgical History  Procedure Laterality Date  . Peg tube placement    . Peg tube removal      History reviewed. No pertinent family history.  Social History:  reports that she has been smoking  Cigarettes.  She has been smoking about 0.00 packs per day. She does not have any smokeless tobacco history on file. She reports that she does not drink alcohol or use illicit drugs.  Allergies:  Allergies  Allergen Reactions  . Codeine     Medications: Prior to Admission medications   Medication Sig Start Date End Date Taking? Authorizing Provider  amLODipine-benazepril (LOTREL) 5-10 MG per capsule Take 1 capsule by mouth daily.   Yes Historical Provider, MD  atorvastatin (LIPITOR) 40 MG tablet Take 40 mg by mouth daily.   Yes Historical Provider, MD  FLUoxetine (PROZAC) 10 MG capsule Take 10 mg by mouth daily.   Yes Historical Provider, MD  labetalol (NORMODYNE) 100 MG tablet Take 100 mg by mouth 2 (two) times daily.   Yes Historical Provider, MD  levothyroxine (SYNTHROID, LEVOTHROID) 25 MCG tablet Take 25 mcg by mouth daily before breakfast.   Yes Historical Provider, MD  PHENobarbital (LUMINAL) 64.8 MG tablet Take 64.8 mg by mouth at bedtime.   Yes Historical Provider, MD    Scheduled Meds: . antiseptic oral rinse  7 mL Mouth Rinse BID  . aspirin  300 mg Rectal Daily   Or  . aspirin  325 mg Oral Daily  . atorvastatin  40 mg Oral Daily  . enoxaparin (LOVENOX) injection  40 mg Subcutaneous Q24H  . FLUoxetine  10 mg Oral Daily  . [START ON 05/05/2014] Influenza vac split quadrivalent PF  0.5 mL Intramuscular Tomorrow-1000  . levothyroxine  25 mcg Oral QAC breakfast  . PHENObarbital  65 mg Intravenous QHS   Continuous Infusions:  PRN Meds:.labetalol, LORazepam     Results for orders placed during the hospital encounter of 05/03/14 (from the past 48 hour(s))  CBC WITH DIFFERENTIAL     Status: None   Collection Time    05/03/14  9:15 PM      Result Value Ref Range   WBC 6.4  4.0 - 10.5 K/uL   RBC 4.22  3.87 - 5.11 MIL/uL   Hemoglobin 12.9  12.0 - 15.0 g/dL   HCT 37.9  36.0 - 46.0 %   MCV 89.8  78.0 - 100.0 fL   MCH 30.6  26.0 - 34.0 pg   MCHC 34.0  30.0 - 36.0 g/dL   RDW  14.4  11.5 - 15.5 %   Platelets 265  150 - 400 K/uL   Neutrophils Relative % 44  43 - 77 %   Neutro Abs 2.8  1.7 - 7.7 K/uL   Lymphocytes Relative 44  12 - 46 %   Lymphs Abs 2.9  0.7 - 4.0 K/uL   Monocytes Relative 10  3 - 12 %   Monocytes Absolute 0.7  0.1 - 1.0 K/uL   Eosinophils Relative 1  0 - 5 %   Eosinophils Absolute 0.1  0.0 - 0.7 K/uL   Basophils Relative 1  0 - 1 %   Basophils Absolute 0.0  0.0 - 0.1 K/uL  BASIC METABOLIC PANEL     Status: Abnormal   Collection Time    05/03/14  9:15 PM      Result Value Ref Range   Sodium 138  137 - 147 mEq/L  Potassium 4.5  3.7 - 5.3 mEq/L   Chloride 98  96 - 112 mEq/L   CO2 26  19 - 32 mEq/L   Glucose, Bld 114 (*) 70 - 99 mg/dL   BUN 10  6 - 23 mg/dL   Creatinine, Ser 0.70  0.50 - 1.10 mg/dL   Calcium 9.6  8.4 - 10.5 mg/dL   GFR calc non Af Amer 88 (*) >90 mL/min   GFR calc Af Amer >90  >90 mL/min   Comment: (NOTE)     The eGFR has been calculated using the CKD EPI equation.     This calculation has not been validated in all clinical situations.     eGFR's persistently <90 mL/min signify possible Chronic Kidney     Disease.   Anion gap 14  5 - 15  I-STAT CHEM 8, ED     Status: Abnormal   Collection Time    05/03/14  9:43 PM      Result Value Ref Range   Sodium 135 (*) 137 - 147 mEq/L   Potassium 4.4  3.7 - 5.3 mEq/L   Chloride 105  96 - 112 mEq/L   BUN 9  6 - 23 mg/dL   Creatinine, Ser 0.80  0.50 - 1.10 mg/dL   Glucose, Bld 115 (*) 70 - 99 mg/dL   Calcium, Ion 1.18  1.13 - 1.30 mmol/L   TCO2 28  0 - 100 mmol/L   Hemoglobin 13.9  12.0 - 15.0 g/dL   HCT 41.0  36.0 - 46.0 %  I-STAT TROPOININ, ED     Status: None   Collection Time    05/03/14  9:45 PM      Result Value Ref Range   Troponin i, poc 0.00  0.00 - 0.08 ng/mL   Comment 3            Comment: Due to the release kinetics of cTnI,     a negative result within the first hours     of the onset of symptoms does not rule out     myocardial infarction with certainty.      If myocardial infarction is still suspected,     repeat the test at appropriate intervals.  PROTIME-INR     Status: None   Collection Time    05/03/14 10:27 PM      Result Value Ref Range   Prothrombin Time 13.5  11.6 - 15.2 seconds   INR 1.03  0.00 - 1.49  APTT     Status: None   Collection Time    05/03/14 10:27 PM      Result Value Ref Range   aPTT 27  24 - 37 seconds  ETHANOL     Status: None   Collection Time    05/03/14 10:27 PM      Result Value Ref Range   Alcohol, Ethyl (B) <11  0 - 11 mg/dL   Comment:            LOWEST DETECTABLE LIMIT FOR     SERUM ALCOHOL IS 11 mg/dL     FOR MEDICAL PURPOSES ONLY  MRSA PCR SCREENING     Status: None   Collection Time    05/04/14  1:33 AM      Result Value Ref Range   MRSA by PCR NEGATIVE  NEGATIVE   Comment:            The GeneXpert MRSA Assay (FDA     approved for  NASAL specimens     only), is one component of a     comprehensive MRSA colonization     surveillance program. It is not     intended to diagnose MRSA     infection nor to guide or     monitor treatment for     MRSA infections.  URINE RAPID DRUG SCREEN (HOSP PERFORMED)     Status: Abnormal   Collection Time    05/04/14  4:19 AM      Result Value Ref Range   Opiates NONE DETECTED  NONE DETECTED   Cocaine NONE DETECTED  NONE DETECTED   Benzodiazepines NONE DETECTED  NONE DETECTED   Amphetamines NONE DETECTED  NONE DETECTED   Tetrahydrocannabinol NONE DETECTED  NONE DETECTED   Barbiturates POSITIVE (*) NONE DETECTED   Comment:            DRUG SCREEN FOR MEDICAL PURPOSES     ONLY.  IF CONFIRMATION IS NEEDED     FOR ANY PURPOSE, NOTIFY LAB     WITHIN 5 DAYS.                LOWEST DETECTABLE LIMITS     FOR URINE DRUG SCREEN     Drug Class       Cutoff (ng/mL)     Amphetamine      1000     Barbiturate      200     Benzodiazepine   414     Tricyclics       239     Opiates          300     Cocaine          300     THC              50  URINALYSIS,  ROUTINE W REFLEX MICROSCOPIC     Status: Abnormal   Collection Time    05/04/14  4:19 AM      Result Value Ref Range   Color, Urine YELLOW  YELLOW   APPearance CLEAR  CLEAR   Specific Gravity, Urine 1.010  1.005 - 1.030   pH 6.5  5.0 - 8.0   Glucose, UA NEGATIVE  NEGATIVE mg/dL   Hgb urine dipstick TRACE (*) NEGATIVE   Bilirubin Urine NEGATIVE  NEGATIVE   Ketones, ur TRACE (*) NEGATIVE mg/dL   Protein, ur NEGATIVE  NEGATIVE mg/dL   Urobilinogen, UA 0.2  0.0 - 1.0 mg/dL   Nitrite NEGATIVE  NEGATIVE   Leukocytes, UA NEGATIVE  NEGATIVE  URINE MICROSCOPIC-ADD ON     Status: None   Collection Time    05/04/14  4:19 AM      Result Value Ref Range   Squamous Epithelial / LPF RARE  RARE   WBC, UA 0-2  <3 WBC/hpf   RBC / HPF 3-6  <3 RBC/hpf   Bacteria, UA RARE  RARE  HEMOGLOBIN A1C     Status: Abnormal   Collection Time    05/04/14  5:05 AM      Result Value Ref Range   Hemoglobin A1C 6.2 (*) <5.7 %   Comment: (NOTE)  According to the ADA Clinical Practice Recommendations for 2011, when     HbA1c is used as a screening test:      >=6.5%   Diagnostic of Diabetes Mellitus               (if abnormal result is confirmed)     5.7-6.4%   Increased risk of developing Diabetes Mellitus     References:Diagnosis and Classification of Diabetes Mellitus,Diabetes     YKDX,8338,25(KNLZJ 1):S62-S69 and Standards of Medical Care in             Diabetes - 2011,Diabetes QBHA,1937,90 (Suppl 1):S11-S61.   Mean Plasma Glucose 131 (*) <117 mg/dL   Comment: Performed at [place]: None   Collection Time    05/04/14  5:05 AM      Result Value Ref Range   Cholesterol 190  0 - 200 mg/dL   Triglycerides 48  <150 mg/dL   HDL 99  >39 mg/dL   Total CHOL/HDL Ratio 1.9     VLDL 10  0 - 40 mg/dL   LDL Cholesterol 81  0 - 99 mg/dL   Comment:            Total Cholesterol/HDL:CHD Risk     Coronary  Heart Disease Risk Table                         Men   Women      1/2 Average Risk   3.4   3.3      Average Risk       5.0   4.4      2 X Average Risk   9.6   7.1      3 X Average Risk  23.4   11.0                Use the calculated Patient Ratio     above and the CHD Risk Table     to determine the patient's CHD Risk.                ATP III CLASSIFICATION (LDL):      <100     mg/dL   Optimal      100-129  mg/dL   Near or Above                        Optimal      130-159  mg/dL   Borderline      160-189  mg/dL   High      >190     mg/dL   Very High  CBC     Status: None   Collection Time    05/04/14  1:36 PM      Result Value Ref Range   WBC 7.4  4.0 - 10.5 K/uL   RBC 4.04  3.87 - 5.11 MIL/uL   Hemoglobin 12.3  12.0 - 15.0 g/dL   HCT 36.2  36.0 - 46.0 %   MCV 89.6  78.0 - 100.0 fL   MCH 30.4  26.0 - 34.0 pg   MCHC 34.0  30.0 - 36.0 g/dL   RDW 14.2  11.5 - 15.5 %   Platelets 243  150 - 400 K/uL    Studies/Results:  MRI BRAIN Left frontal opercular region 2.7 x 2.7 x 2.3 cm extra-axial mass  most consistent with a meningioma with vasogenic edema  within the  left frontal lobe and superior aspect of the left basal ganglia.  No acute infarct.  Remote hemorrhagic infarcts right parietal-occipital lobe and left  parietal lobe with encephalomalacia and subsequent dilation of the  atrium of the right lateral ventricle. Remote infarct with  encephalomalacia left lenticular nucleus.  Tiny blood breakdown products right pons probably related to prior  episode of hemorrhagic ischemia. Small cavernoma felt to be less  likely consideration.  Baseline atrophy without hydrocephalus.   BRAIN MRA luminal irregularities  Carotid dopplers fine.     The brain MRI is reviewed in person. There are bilateral encephalomalacia is involving the occipital regions bilaterally more on the right side. This associated with decreased signal on echo gradient indicating chronic degradation of  blood products. There is also a single tiny reduced signal seen on echo gradient involving the right pontine area. There is a left frontal moderate-sized extra-axial lesion with mass effect and vasogenic edema. The character is consistent with a meningioma. This lesion is increased on FLAIR imaging but not on the ADC.    Renelle Stegenga A. Merlene [name], M.D.  Diplomate, Tax adviser of Psychiatry and Neurology ( Neurology). 05/04/2014, 4:45 PM

## 2014-05-04 NOTE — Evaluation (Signed)
Physical Therapy Evaluation Patient Details Name: [name] MRN: [id number] DOB: June 22, 1946 Today's Date: 05/04/2014   History of Present Illness  Pt is a 68 year old female who was admitted with a sudden change in mental status and right sided weakness.  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  Per chart, pt has a history of seizure.  She is severely HOH.  Per family report in chart, pt is normally able to ambulate and transfer at home without difficulty.  Clinical Impression   Pt was seen for evaluation.  She was extremely difficult to communicate with due to severe hearing loss and mild confusion.  RN reports visual deficit as well but I was unable to determine the extent of this...she was unable to follow some visual commands but I am not sure if this is because of her confusion or inability to see.  Her strength was evaluated functionally and I could not see any significant difference from right to left.  Her balance appeared to be WNL and she was able to ambulate in her room with no loss of balance.  Unless others note deficiencies that I have missed, I do not see any PT intervention needed.    Follow Up Recommendations No PT follow up    Equipment Recommendations  None recommended by PT    Recommendations for Other Services   none    Precautions / Restrictions Precautions Precautions: Fall Restrictions Weight Bearing Restrictions: No      Mobility  Bed Mobility Overal bed mobility: Independent                Transfers Overall transfer level: Independent                  Ambulation/Gait Ambulation/Gait assistance: Modified independent (Device/Increase time) Ambulation Distance (Feet): 30 Feet Assistive device: None Gait Pattern/deviations: WFL(Within Functional Limits)   Gait velocity interpretation: at or above normal speed for age/gender General Gait Details: gait analysis is somewhat limited by ICU setting.Marland KitchenMarland KitchenI did not see any  specific abnormality  Financial trader Rankin (Stroke Patients Only)       Balance Overall balance assessment: No apparent balance deficits (not formally assessed)                                           Pertinent Vitals/Pain Pain Assessment: No/denies pain    Home Living Family/patient expects to be discharged to:: Private residence Living Arrangements: Spouse/significant other Available Help at Discharge: Family             Additional Comments: pt is unable to provide additional information    Prior Function Level of Independence: Independent               Hand Dominance   Dominant Hand: Right    Extremity/Trunk Assessment   Upper Extremity Assessment: Defer to OT evaluation           Lower Extremity Assessment: Overall WFL for tasks assessed      Cervical / Trunk Assessment: Normal  Communication   Communication: HOH  Cognition Arousal/Alertness: Awake/alert Behavior During Therapy: Impulsive Overall Cognitive Status: No family/caregiver present to determine baseline cognitive functioning  General Comments      Exercises        Assessment/Plan    PT Assessment Patent does not need any further PT services  PT Diagnosis     PT Problem List    PT Treatment Interventions     PT Goals (Current goals can be found in the Care Plan section) Acute Rehab PT Goals PT Goal Formulation: No goals set, d/c therapy    Frequency     Barriers to discharge  unknown      Co-evaluation               End of Session Equipment Utilized During Treatment: Gait belt Activity Tolerance: Patient tolerated treatment well Patient left: in bed;with call bell/phone within reach;with bed alarm set           Time: 3810-1751 PT Time Calculation (min): 27 min   Charges:   PT Evaluation $Initial PT Evaluation Tier I: 1 Procedure     PT G CodesOwens Shark, Yatziri Wainwright L 05/04/2014, 1:30 PM

## 2014-05-05 ENCOUNTER — Inpatient Hospital Stay (HOSPITAL_COMMUNITY)
Admission: EM | Admit: 2014-05-05 | Discharge: 2014-05-05 | Disposition: A | Discharge status: Home / Self Care | Payer: Medicare HMO | Source: Home / Self Care | Attending: Family Medicine | Admitting: Family Medicine

## 2014-05-05 ENCOUNTER — Encounter (HOSPITAL_COMMUNITY): Payer: Self-pay

## 2014-05-05 ENCOUNTER — Inpatient Hospital Stay (HOSPITAL_COMMUNITY): Payer: Medicare HMO

## 2014-05-05 DIAGNOSIS — G40909 Epilepsy, unspecified, not intractable, without status epilepticus: Secondary | ICD-10-CM | POA: Diagnosis not present

## 2014-05-05 LAB — BASIC METABOLIC PANEL
ANION GAP: 10 (ref 5–15)
BUN: 15 mg/dL (ref 6–23)
CO2: 28 mEq/L (ref 19–32)
Calcium: 9.1 mg/dL (ref 8.4–10.5)
Chloride: 102 mEq/L (ref 96–112)
Creatinine, Ser: 0.8 mg/dL (ref 0.50–1.10)
GFR calc Af Amer: 87 mL/min — ABNORMAL LOW (ref >90)
GFR, EST NON AFRICAN AMERICAN: 75 mL/min — AB (ref >90)
GLUCOSE: 97 mg/dL (ref 70–99)
POTASSIUM: 4.4 meq/L (ref 3.7–5.3)
SODIUM: 140 meq/L (ref 137–147)

## 2014-05-05 LAB — FOLATE: FOLATE: 17.9 ng/mL

## 2014-05-05 LAB — RPR

## 2014-05-05 LAB — PHENOBARBITAL LEVEL: Phenobarbital: 13 ug/mL — ABNORMAL LOW (ref 15.0–40.0)

## 2014-05-05 LAB — VITAMIN B12: Vitamin B-12: 564 pg/mL (ref 211–911)

## 2014-05-05 MED ORDER — NICOTINE 14 MG/24HR TD PT24
14.0000 mg | MEDICATED_PATCH | Freq: Every day | TRANSDERMAL | Status: DC
  Administered 2014-05-05 – 2014-05-06 (×2): 14 mg via TRANSDERMAL
  Filled 2014-05-05 (×2): qty 1

## 2014-05-05 MED ORDER — GADOBENATE DIMEGLUMINE 529 MG/ML IV SOLN
10.0000 mL | Freq: Once | INTRAVENOUS | Status: AC | PRN
  Administered 2014-05-05: 10 mL via INTRAVENOUS

## 2014-05-05 MED ORDER — PHENOBARBITAL 97.2 MG PO TABS
97.2000 mg | ORAL_TABLET | Freq: Every day | ORAL | Status: DC
  Administered 2014-05-05: 97.2 mg via ORAL
  Filled 2014-05-05: qty 1

## 2014-05-05 MED ORDER — PHENOBARBITAL 32.4 MG PO TABS: 64.8000 mg | ORAL_TABLET | Freq: Every day | ORAL | Status: DC

## 2014-05-05 NOTE — Progress Notes (Signed)
UR chart review completed.  

## 2014-05-05 NOTE — Progress Notes (Signed)
The patient is receiving Phenobarbital by the intravenous route.  Based on criteria approved by the Pharmacy and [place], the medication is being converted to the equivalent oral dose form.  These criteria include: -No Active GI bleeding -Able to tolerate diet of full liquids (or better) or tube feeding OR able to tolerate other medications by the oral or enteral route  If you have any questions about this conversion, please contact the Pharmacy Department (ext [phone]).  Thank you.  Biagio Borg, [facility] 05/05/2014 11:03 AM

## 2014-05-05 NOTE — Progress Notes (Signed)
EEG Completed; Results Pending  

## 2014-05-05 NOTE — Evaluation (Addendum)
Occupational Therapy Evaluation Patient Details Name: Carlise Stofer MRN: [id number] DOB: 04/22/1946 Today's Date: 05/05/2014    History of Present Illness Pt is a 68 year old female who was admitted with a sudden change in mental status and right sided weakness.  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  Per chart, pt has a history of seizure.  She is severely HOH.  Per family report in chart, pt is normally able to ambulate and transfer at home without difficulty.   Clinical Impression   Pt is presenting to acute OT with above situation.  She remains confused and HOH, leading to few details into pt's home situation and baseline functioning.  Family was not present during evaluation.  Pt has increased difficulty with presented ADL tasks this session, including donn/doff socks and opening a box.  Required significantly increased time, cueing for completing tasks, and for dressing min assist to complete.  Pt will benefit from [facility] evaluation to determine pt's baseline functioning in the home and improved ADL independence in the home environment.  Strength and ROM appears to be [facility], and pt does not need further acute OT services. Recommend HHOT at this time    Follow Up Recommendations  Home health OT    Equipment Recommendations  Other (comment);Tub/shower seat (Pt unable to provide details into home set up.  If family does not have shower seat, pt woud benefit.)    Recommendations for Other Services       Precautions / Restrictions Precautions Precautions: Fall Restrictions Weight Bearing Restrictions: No      Mobility Bed Mobility Overal bed mobility: Independent                Transfers Overall transfer level: Independent                    Balance Overall balance assessment: No apparent balance deficits (not formally assessed)                                          ADL Overall ADL's : Needs  assistance/impaired Eating/Feeding: Set up;Bed level Eating/Feeding Details (indicate cue type and reason): per RN report, pt faigued quickly and requested assist                 Lower Body Dressing: Minimal assistance;Bed level Lower Body Dressing Details (indicate cue type and reason): Sitting EOB, pt attempted multiple times to don sock.  Was attempting to use only R hand, despite prompts for using bilateral hands.  During attempt to put on sock, pt forgot what she was doing and required cueing.  Pt verbalized difficulty being due to 'rushing through, despite pt taking approx 4 minutes to don one sock.  OTR provided min assist when pt began to become frustrated. Toilet Transfer: Independent           Functional mobility during ADLs: Modified independent       Vision                     Perception     Praxis      Pertinent Vitals/Pain Pain Assessment: No/denies pain     Hand Dominance Right   Extremity/Trunk Assessment Upper Extremity Assessment Upper Extremity Assessment: Overall WFL for tasks assessed (In functional assesment, pt appeared to have [facility]Om and strength.  Pt has increased left  finger flexion, but pt reports that is baseline)   Lower Extremity Assessment Lower Extremity Assessment: Defer to PT evaluation       Communication Communication Communication: HOH   Cognition Arousal/Alertness: Awake/alert Behavior During Therapy: Impulsive;Restless Overall Cognitive Status: No family/caregiver present to determine baseline cognitive functioning                     General Comments       Exercises       Shoulder Instructions      Home Living Family/patient expects to be discharged to:: Private residence Living Arrangements: Spouse/significant other Available Help at Discharge: Family                             Additional Comments: pt is unable to provide additional information      Prior  Functioning/Environment Level of Independence: Independent             OT Diagnosis: Altered mental status   OT Problem List: Decreased activity tolerance;Other (comment) (ADL status in the home)   OT Treatment/Interventions:      OT Goals(Current goals can be found in the care plan section) Acute Rehab OT Goals Patient Stated Goal: None stated - No acute OT goals needed OT Goal Formulation: With patient  OT Frequency:     Barriers to D/C:            Co-evaluation              End of Session Equipment Utilized During Treatment: Gait belt  Activity Tolerance: Patient tolerated treatment well Patient left: in bed;with call bell/phone within reach;with bed alarm set   Time: 6712-4580 OT Time Calculation (min): 15 min Charges:  OT General Charges $OT Visit: 1 Procedure OT Evaluation $Initial OT Evaluation Tier I: 1 Procedure G-Codes:     Bea Graff, MS, OTR/L [phone]  05/05/2014, 9:43 AM

## 2014-05-05 NOTE — Progress Notes (Signed)
Patient's mental status and orientation near baseline patient being well talkative. [name] MVH:[id number] DOB: [birth year] DOA: 05/03/2014 PCP: Maricela Curet, MD             Physical Exam: Blood pressure 106/83, pulse 60, temperature 98.4 F (36.9 C), temperature source Oral, resp. rate 14, height 5\' 5"  (1.651 m), weight 110 lb 14.3 oz (50.3 kg), SpO2 100.00%.*nature JVD lungs no rales wheeze or rhonchi appreciable heart regular rhythm S1-S2 of normal intensity no S3-S4 abdomens soft nontender bowel sounds normoactive   Investigations:  Recent Results (from the past 240 hour(s))  MRSA PCR SCREENING     Status: None   Collection Time    05/04/14  1:33 AM      Result Value Ref Range Status   MRSA by PCR NEGATIVE  NEGATIVE Final   Comment:            The GeneXpert MRSA Assay (FDA     approved for NASAL specimens     only), is one component of a     comprehensive MRSA colonization     surveillance program. It is not     intended to diagnose MRSA     infection nor to guide or     monitor treatment for     MRSA infections.     Basic Metabolic Panel:  Recent Labs  05/03/14 2115 05/03/14 2143 05/05/14 0430  NA 138 135* 140  K 4.5 4.4 4.4  CL 98 105 102  CO2 26  --  28  GLUCOSE 114* 115* 97  BUN 10 9 15   CREATININE 0.70 0.80 0.80  CALCIUM 9.6  --  9.1   Liver Function Tests: No results found for this basename: AST, ALT, ALKPHOS, BILITOT, PROT, ALBUMIN,  in the last 72 hours   CBC:  Recent Labs  05/03/14 2115 05/03/14 2143 05/04/14 1336  WBC 6.4  --  7.4  NEUTROABS 2.8  --   --   HGB 12.9 13.9 12.3  HCT 37.9 41.0 36.2  MCV 89.8  --  89.6  PLT 265  --  243    Dg Chest 1 View  05/04/2014   CLINICAL DATA:  Stroke symptoms  EXAM: CHEST - 1 VIEW  COMPARISON:  None.  FINDINGS: The heart size and mediastinal contours are within normal limits. Both lungs are clear. The visualized skeletal structures are unremarkable.  IMPRESSION: No active disease.    Electronically Signed   By: Inez Catalina M.D.   On: 05/04/2014 10:10   Ct Head Wo Contrast  05/03/2014   CLINICAL DATA:  Code stroke, altered mental status, slurred speech  EXAM: CT HEAD WITHOUT CONTRAST  TECHNIQUE: Contiguous axial images were obtained from the base of the skull through the vertex without intravenous contrast.  COMPARISON:  None.  FINDINGS: Severely motion degraded images.  Hypodensity in the left basal ganglia is worrisome for acute/subacute infarct.  No evidence of parenchymal hemorrhage or extra-axial fluid collection.  No mass lesion, mass effect, or midline shift.  Dilatation of the occipital horns of the lateral ventricles.  The visualized paranasal sinuses are essentially clear. The mastoid air cells are unopacified.  No evidence of calvarial fracture.  IMPRESSION: Severely motion degraded images.  Hypodensity in the left basal ganglia is worrisome for acute/subacute infarct.  These results were called by telephone at the time of interpretation on 05/03/2014 at 10:18 pm to Dr. Nat Christen , who verbally acknowledged these results.   Electronically Signed   By: Bertis Ruddy  [place] Pink M.D.   On: 05/03/2014 22:19   Mr [name] Wo Contrast  05/04/2014   CLINICAL DATA:  New of new 68 year old female with altered mental status. History of seizures, strokes and high blood pressure.  EXAM: MRI HEAD WITHOUT CONTRAST  MRA HEAD WITHOUT CONTRAST  TECHNIQUE: Multiplanar, multiecho pulse sequences of the brain and surrounding structures were obtained without intravenous contrast. Angiographic images of the head were obtained using MRA technique without contrast.  COMPARISON:  05/03/2014 CT.  FINDINGS: MRI HEAD FINDINGS  Exam is motion degraded.  No acute infarct.  Remote hemorrhagic infarcts right parietal-occipital lobe and left parietal lobe with encephalomalacia and subsequent dilation of the atrium of the right lateral ventricle. Remote infarct with encephalomalacia left lenticular nucleus.  Tiny blood  breakdown products right pons probably related to prior episode of hemorrhagic ischemia. Small cavernoma felt to be less likely consideration.  Left frontal opercular region 2.7 x 2.7 x 2.3 cm extra-axial mass most consistent with a meningioma with vasogenic edema within the left frontal lobe and superior aspect of the left basal ganglia.  Baseline atrophy without hydrocephalus.  Partially empty non expanded sella.  Major intracranial vascular structures are patent.  Minimal mucosal thickening mastoid air cells and ethmoid sinus air cells.  Cervical medullary junction, pineal region and orbital structures grossly within normal limits.  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  Prominent infundibulum left posterior communicating artery origin region.  Mild narrowing and irregularity of the cavernous segment of the internal carotid artery greater on the left.  Middle cerebral artery branch vessel moderate narrowing and irregularity greater on the left.  Ectatic vertebral arteries and basilar artery. Mild narrowing portions of the distal right vertebral artery without high-grade stenosis.  Nonvisualized left anterior inferior cerebellar artery.  Moderate to marked narrowing mid to distal aspect of the posterior cerebral artery bilaterally.  IMPRESSION: MRI HEAD:  Exam is motion degraded.  Left frontal opercular region 2.7 x 2.7 x 2.3 cm extra-axial mass most consistent with a meningioma with vasogenic edema within the left frontal lobe and superior aspect of the left basal ganglia.  No acute infarct.  Remote hemorrhagic infarcts right parietal-occipital lobe and left parietal lobe with encephalomalacia and subsequent dilation of the atrium of the right lateral ventricle. Remote infarct with encephalomalacia left lenticular nucleus.  Tiny blood breakdown products right pons probably related to prior episode of hemorrhagic ischemia. Small cavernoma felt to be less likely  consideration.  Baseline atrophy without hydrocephalus.  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  Prominent infundibulum left posterior communicating artery origin region.  Mild narrowing and irregularity of the cavernous segment of the internal carotid artery greater on the left.  Middle cerebral artery branch vessel moderate narrowing and irregularity greater on the left.  Ectatic vertebral arteries and basilar artery. Mild narrowing portions of the distal right vertebral artery without high-grade stenosis.  Nonvisualized left anterior inferior cerebellar artery.  Moderate to marked narrowing mid to distal aspect of the posterior cerebral artery bilaterally.  These results will be called to the ordering clinician or representative by the Radiologist Assistant, and communication documented in the PACS or zVision Dashboard.   Electronically Signed   By: Chauncey Cruel M.D.   On: 05/04/2014 11:29   Mr Brain Wo Contrast  05/04/2014   CLINICAL DATA:  New of new 68 year old female with altered mental status. History of seizures, strokes  and high blood pressure.  EXAM: MRI HEAD WITHOUT CONTRAST  MRA HEAD WITHOUT CONTRAST  TECHNIQUE: Multiplanar, multiecho pulse sequences of the brain and surrounding structures were obtained without intravenous contrast. Angiographic images of the head were obtained using MRA technique without contrast.  COMPARISON:  05/03/2014 CT.  FINDINGS: MRI HEAD FINDINGS  Exam is motion degraded.  No acute infarct.  Remote hemorrhagic infarcts right parietal-occipital lobe and left parietal lobe with encephalomalacia and subsequent dilation of the atrium of the right lateral ventricle. Remote infarct with encephalomalacia left lenticular nucleus.  Tiny blood breakdown products right pons probably related to prior episode of hemorrhagic ischemia. Small cavernoma felt to be less likely consideration.  Left frontal opercular region 2.7 x 2.7 x 2.3 cm extra-axial  mass most consistent with a meningioma with vasogenic edema within the left frontal lobe and superior aspect of the left basal ganglia.  Baseline atrophy without hydrocephalus.  Partially empty non expanded sella.  Major intracranial vascular structures are patent.  Minimal mucosal thickening mastoid air cells and ethmoid sinus air cells.  Cervical medullary junction, pineal region and orbital structures grossly within normal limits.  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  Prominent infundibulum left posterior communicating artery origin region.  Mild narrowing and irregularity of the cavernous segment of the internal carotid artery greater on the left.  Middle cerebral artery branch vessel moderate narrowing and irregularity greater on the left.  Ectatic vertebral arteries and basilar artery. Mild narrowing portions of the distal right vertebral artery without high-grade stenosis.  Nonvisualized left anterior inferior cerebellar artery.  Moderate to marked narrowing mid to distal aspect of the posterior cerebral artery bilaterally.  IMPRESSION: MRI HEAD:  Exam is motion degraded.  Left frontal opercular region 2.7 x 2.7 x 2.3 cm extra-axial mass most consistent with a meningioma with vasogenic edema within the left frontal lobe and superior aspect of the left basal ganglia.  No acute infarct.  Remote hemorrhagic infarcts right parietal-occipital lobe and left parietal lobe with encephalomalacia and subsequent dilation of the atrium of the right lateral ventricle. Remote infarct with encephalomalacia left lenticular nucleus.  Tiny blood breakdown products right pons probably related to prior episode of hemorrhagic ischemia. Small cavernoma felt to be less likely consideration.  Baseline atrophy without hydrocephalus.  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  Prominent infundibulum left posterior communicating artery origin  region.  Mild narrowing and irregularity of the cavernous segment of the internal carotid artery greater on the left.  Middle cerebral artery branch vessel moderate narrowing and irregularity greater on the left.  Ectatic vertebral arteries and basilar artery. Mild narrowing portions of the distal right vertebral artery without high-grade stenosis.  Nonvisualized left anterior inferior cerebellar artery.  Moderate to marked narrowing mid to distal aspect of the posterior cerebral artery bilaterally.  These results will be called to the ordering clinician or representative by the Radiologist Assistant, and communication documented in the PACS or zVision Dashboard.   Electronically Signed   By: Chauncey Cruel M.D.   On: 05/04/2014 11:29   Mr Jeri Cos Contrast  05/05/2014   CLINICAL DATA:  Follow-up brain tumor. Altered mental status. Extra-axial mass seen on recent noncontrast brain MRI.  EXAM: MRI HEAD WITH CONTRAST  TECHNIQUE: Multiplanar, multiecho pulse sequences of the brain and surrounding structures were obtained with intravenous contrast.  COMPARISON:  Noncontrast brain MRI 05/04/2014  CONTRAST:  45mL MULTIHANCE GADOBENATE DIMEGLUMINE 529 MG/ML IV SOLN  FINDINGS: As described on recent noncontrast brain MRI, there is an extra-axial mass overlying the left frontal operculum which demonstrates moderate homogeneous enhancement and measures 2.7 x 2.4 x 2.6 cm (AP by transverse by craniocaudal). There is an associated dural tail/dural thickening extending from the mass. There is local mass effect on the operculum with small to moderate amount of associated vasogenic edema in the adjacent brain parenchyma. There is no midline shift. No masslike enhancement is seen elsewhere.  Encephalomalacia is again seen involving the right greater than left parietal lobes. There is a small amount of predominantly curvilinear enhancement in these regions, right greater than left, which may reflect late subacute ischemia as well as  vascular enhancement. It is also possible that some of this reflects intrinsic T1 hyperintensity, such as from mineralization, rather than enhancement, with evaluation somewhat limited by motion on the prior study and lack of precontrast T1 images on today's examination for direct comparison.  Ex vacuo dilatation of the right lateral ventricle is noted in addition to remote left lentiform nucleus infarct and likely old right pontine infarct.  IMPRESSION: 1. Enhancing left frontal a particular extra-axial mass, consistent with a meningioma. 2. Old bilateral parietal infarcts as above.   Electronically Signed   By: Logan Bores   On: 05/05/2014 11:47   US Carotid Bilateral  05/04/2014   CLINICAL DATA:  68 year old female with a history of stroke  EXAM: BILATERAL CAROTID DUPLEX ULTRASOUND  TECHNIQUE: Pearline Cables scale imaging, color Doppler and duplex ultrasound were performed of bilateral carotid and vertebral arteries in the neck.  COMPARISON:  No prior duplex for comparison. CT of the brain 05/03/2014. MRI completed on the same date.  FINDINGS: Criteria: Quantification of carotid stenosis is based on velocity parameters that correlate the residual internal carotid diameter with NASCET-based stenosis levels, using the diameter of the distal internal carotid lumen as the denominator for stenosis measurement.  The following velocity measurements were obtained:  RIGHT  ICA:  Systolic 69 cm/sec, Diastolic 21 cm/sec  CCA:  49 cm/sec  SYSTOLIC ICA/CCA RATIO:  1.4  DIASTOLIC ICA/CCA RATIO:  1.8  ECA:  57 cm/sec  LEFT  ICA:  Systolic 54 cm/sec, Diastolic 20 cm/sec  CCA:  58 cm/sec  SYSTOLIC ICA/CCA RATIO:  3.01  DIASTOLIC ICA/CCA RATIO:  1.6  ECA:  80 cm/sec  RIGHT CAROTID ARTERY: Changes of atherosclerosis involving the right common carotid artery and carotid bulb with mild -moderate calcifications. Intermediate waveform maintained within the common carotid artery. Right internal carotid artery demonstrates heterogeneous plaque  with associated calcifications. Low resistance waveform maintained.  RIGHT VERTEBRAL ARTERY:  Antegrade flow with low resistance waveform  LEFT CAROTID ARTERY: Atherosclerotic changes of the left common carotid artery, carotid bulb, and internal carotid artery. Waveforms within normal limits. No significant calcifications.  LEFT VERTEBRAL ARTERY:  Antegrade flow with low resistance waveform.  IMPRESSION: Moderate heterogeneous and partially calcified plaque of the bilateral extracranial cerebral vascular circulation without significant stenosis identified by duplex criteria.  Signed,  Dulcy Fanny. Earleen Newport, DO  [facility]  [facility]   Electronically Signed   By: Corrie Mckusick O.D.   On: 05/04/2014 11:34      Medications:   Impression: Altered mental status multifactorial Active Problems:   CVA (cerebral infarction)     Plan: Serum phenobarbital level. For possible discharge in 24 hours   Consultants: Neurology    Procedure   Antibiotics:  Code Status:   Family Communication:  Spoke with his sister and brother at bedside  Disposition Plan phenobarbital level possible discharge  Time spent: 30 minutes   LOS: 2 days   Mesha Schamberger M   05/05/2014, 12:31 PM

## 2014-05-05 NOTE — Procedures (Signed)
  [name], MD     [email or web address]           HISTORY: This is a 68 year old female who presents with altered mental status. There is a history of seizures.   MEDICATIONS: Scheduled Meds: . antiseptic oral rinse  7 mL Mouth Rinse BID  . aspirin  300 mg Rectal Daily   Or  . aspirin  325 mg Oral Daily  . atorvastatin  40 mg Oral Daily  . dexamethasone  10 mg Intravenous Q12H  . dexamethasone  6 mg Oral Daily  . enoxaparin (LOVENOX) injection  40 mg Subcutaneous Q24H  . FLUoxetine  10 mg Oral Daily  . Influenza vac split quadrivalent PF  0.5 mL Intramuscular Tomorrow-1000  . levothyroxine  25 mcg Oral QAC breakfast  . PHENobarbital  64.8 mg Oral QHS   Continuous Infusions:  PRN Meds:.labetalol, LORazepam  Prior to Admission medications   Medication Sig Start Date End Date Taking? Authorizing Provider  amLODipine-benazepril (LOTREL) 5-10 MG per capsule Take 1 capsule by mouth daily.   Yes Historical Provider, MD  atorvastatin (LIPITOR) 40 MG tablet Take 40 mg by mouth daily.   Yes Historical Provider, MD  FLUoxetine (PROZAC) 10 MG capsule Take 10 mg by mouth daily.   Yes Historical Provider, MD  labetalol (NORMODYNE) 100 MG tablet Take 100 mg by mouth 2 (two) times daily.   Yes Historical Provider, MD  levothyroxine (SYNTHROID, LEVOTHROID) 25 MCG tablet Take 25 mcg by mouth daily before breakfast.   Yes Historical Provider, MD  PHENobarbital (LUMINAL) 64.8 MG tablet Take 64.8 mg by mouth at bedtime.   Yes Historical Provider, MD      ANALYSIS: A 16 channel recording using standard 10 20 measurements is conducted for 21 minutes. There is a posterior dominant rhythm that gets as high as 10 Hz on the left and 10.5 Hz on the right. The background activity on the right is well formed and a normal-appearing. The activity on the left shows a lower voltage and not well formed. Additionally, there is frequent focal slowing observed in the left  frontotemporal area. Awake and drowsy activities are recorded. Photic stimulation and hyperventilation were not carried out. There is myogenic artifact noted throughout the recording from time to time but does not degrade the quality of the study. There are no epileptiform activity observed.    IMPRESSION: This recording is abnormal showing focal left frontotemporal slowing indicating a focal cerebral disturbance. Additionally, there is overall less well formed activity over the left hemisphere.       Brytani Voth A. [name], M.D.  Diplomate, Tax adviser of Psychiatry and Neurology ( Neurology).

## 2014-05-05 NOTE — Progress Notes (Signed)
Patient ID: [name], female   DOB: 02-12-1946, 68 y.o.   MRN: [id number]   [place] A. [name], MD     [email or web address]          [name] is an 68 y.o. female.   Assessment/Plan:  1. Acute altered mental status Of unclear etiology. However, given the previous large vessel cortical infarcts and meningioma, seizure is suspected.  2. Moderate size left frontal extra-axial mass consistent with a meningioma. This is associated with the moderate vasogenic edema.  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  4. 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 ( Follow with repeat Brain CTA 12 months)     Increase phenobarbital. I think we should increase the phenobarbital tone rather than adding what the so-called new or safer agents. She has been well controlled on phenobarbital for many years and without side effects.  Likely trial of low-dose steroids. Decadron or prednisone should be sufficient.   Outpatient neurosurgical consultation for the meningioma.   Given the high likelihood of cardioembolic strokes, A 30 day event monitor/Holter monitor to evaluate for atrial fibrillation is suggested.   MRI BRAIN W Contrast.    She is much more responsive today. She is complaining that she is uncomfortable because of wanting to smoke. She is asking to smoke a cigarette.   GENERAL: This is a pleasant thin female in no acute distress.  HEENT: Supple. Atraumatic normocephalic. She has significant hearing impairment.  ABDOMEN: soft  EXTREMITIES: No edema  BACK: Normal.  SKIN: Normal by inspection.  MENTAL STATUS: She is awake and alert. There is clear difficulties with comprehension. However, with much prompting she knows that she is in the hospital at [facility]. She is not oriented to time. Speech is mild to moderately dysarthric. She does follow commands with much prompting.  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  MOTOR: There is difficulty ascertaining the exact strength as she seemed to have good tone, bulk and strength on the right side. There is also concern involving the left lower extremity. The left hand is markedly weak measured about 3/5 including hand grip and wrist extension. There is contracture of the left hand. The left upper extremity is also weak measured approximate 4 minus/5.  COORDINATION: Left finger to nose is normal, right finger to nose is normal, No rest tremor; no intention tremor; no postural tremor; no bradykinesia.  REFLEXES: Deep tendon reflexes are symmetrical and normal. Babinski reflexes are flexor bilaterally.  SENSATION: Seems normal to painful some light bilaterally.        Objective: Vital signs in last 24 hours: Temp:  [98.4 F (36.9 C)-99.1 F (37.3 C)] 98.6 F (37 C) (09/29 1600) Pulse Rate:  [37-122] 37 (09/29 1400) Resp:  [14-30] 16 (09/29 1600) BP: (99-127)/(64-94) 122/94 mmHg (09/29 1600) SpO2:  [92 %-100 %] 99 % (09/29 1400)  Intake/Output from previous day: 09/28 0701 - 09/29 0700 In: 0  Out: 200 [Urine:200] Intake/Output this shift:   Nutritional status: Dysphagia   Lab Results: Results for orders placed during the hospital encounter of 05/03/14 (from the past 48  hour(s))  CBC WITH DIFFERENTIAL     Status: None   Collection Time    05/03/14  9:15 PM      Result Value Ref Range   WBC 6.4  4.0 - 10.5 K/uL   RBC 4.22  3.87 - 5.11 MIL/uL   Hemoglobin 12.9  12.0 - 15.0 g/dL   HCT 37.9  36.0 - 46.0 %   MCV 89.8  78.0 - 100.0 fL   MCH 30.6  26.0 - 34.0 pg   MCHC 34.0  30.0  - 36.0 g/dL   RDW 14.4  11.5 - 15.5 %   Platelets 265  150 - 400 K/uL   Neutrophils Relative % 44  43 - 77 %   Neutro Abs 2.8  1.7 - 7.7 K/uL   Lymphocytes Relative 44  12 - 46 %   Lymphs Abs 2.9  0.7 - 4.0 K/uL   Monocytes Relative 10  3 - 12 %   Monocytes Absolute 0.7  0.1 - 1.0 K/uL   Eosinophils Relative 1  0 - 5 %   Eosinophils Absolute 0.1  0.0 - 0.7 K/uL   Basophils Relative 1  0 - 1 %   Basophils Absolute 0.0  0.0 - 0.1 K/uL  BASIC METABOLIC PANEL     Status: Abnormal   Collection Time    05/03/14  9:15 PM      Result Value Ref Range   Sodium 138  137 - 147 mEq/L   Potassium 4.5  3.7 - 5.3 mEq/L   Chloride 98  96 - 112 mEq/L   CO2 26  19 - 32 mEq/L   Glucose, Bld 114 (*) 70 - 99 mg/dL   BUN 10  6 - 23 mg/dL   Creatinine, Ser 0.70  0.50 - 1.10 mg/dL   Calcium 9.6  8.4 - 10.5 mg/dL   GFR calc non Af Amer 88 (*) >90 mL/min   GFR calc Af Amer >90  >90 mL/min   Comment: (NOTE)     The eGFR has been calculated using the CKD EPI equation.     This calculation has not been validated in all clinical situations.     eGFR's persistently <90 mL/min signify possible Chronic Kidney     Disease.   Anion gap 14  5 - 15  I-STAT CHEM 8, ED     Status: Abnormal   Collection Time    05/03/14  9:43 PM      Result Value Ref Range   Sodium 135 (*) 137 - 147 mEq/L   Potassium 4.4  3.7 - 5.3 mEq/L   Chloride 105  96 - 112 mEq/L   BUN 9  6 - 23 mg/dL   Creatinine, Ser 0.80  0.50 - 1.10 mg/dL   Glucose, Bld 115 (*) 70 - 99 mg/dL   Calcium, Ion 1.18  1.13 - 1.30 mmol/L   TCO2 28  0 - 100 mmol/L   Hemoglobin 13.9  12.0 - 15.0 g/dL   HCT 41.0  36.0 - 46.0 %  I-STAT TROPOININ, ED     Status: None   Collection Time    05/03/14  9:45 PM      Result Value Ref Range   Troponin i, poc 0.00  0.00 - 0.08 ng/mL   Comment 3            Comment: Due to the release kinetics of cTnI,     a negative result within the first hours  of the onset of symptoms does not rule out     myocardial  infarction with certainty.     If myocardial infarction is still suspected,     repeat the test at appropriate intervals.  TSH     Status: None   Collection Time    05/03/14 10:21 PM      Result Value Ref Range   TSH 4.350  0.350 - 4.500 uIU/mL   Comment: Performed at [place]     Status: None   Collection Time    05/03/14 10:27 PM      Result Value Ref Range   Prothrombin Time 13.5  11.6 - 15.2 seconds   INR 1.03  0.00 - 1.49  APTT     Status: None   Collection Time    05/03/14 10:27 PM      Result Value Ref Range   aPTT 27  24 - 37 seconds  ETHANOL     Status: None   Collection Time    05/03/14 10:27 PM      Result Value Ref Range   Alcohol, Ethyl (B) <11  0 - 11 mg/dL   Comment:            LOWEST DETECTABLE LIMIT FOR     SERUM ALCOHOL IS 11 mg/dL     FOR MEDICAL PURPOSES ONLY  MRSA PCR SCREENING     Status: None   Collection Time    05/04/14  1:33 AM      Result Value Ref Range   MRSA by PCR NEGATIVE  NEGATIVE   Comment:            The GeneXpert MRSA Assay (FDA     approved for NASAL specimens     only), is one component of a     comprehensive MRSA colonization     surveillance program. It is not     intended to diagnose MRSA     infection nor to guide or     monitor treatment for     MRSA infections.  URINE RAPID DRUG SCREEN (HOSP PERFORMED)     Status: Abnormal   Collection Time    05/04/14  4:19 AM      Result Value Ref Range   Opiates NONE DETECTED  NONE DETECTED   Cocaine NONE DETECTED  NONE DETECTED   Benzodiazepines NONE DETECTED  NONE DETECTED   Amphetamines NONE DETECTED  NONE DETECTED   Tetrahydrocannabinol NONE DETECTED  NONE DETECTED   Barbiturates POSITIVE (*) NONE DETECTED   Comment:            DRUG SCREEN FOR MEDICAL PURPOSES     ONLY.  IF CONFIRMATION IS NEEDED     FOR ANY PURPOSE, NOTIFY LAB     WITHIN 5 DAYS.                LOWEST DETECTABLE LIMITS     FOR URINE DRUG SCREEN     Drug Class       Cutoff (ng/mL)       Amphetamine      1000     Barbiturate      200     Benzodiazepine   606     Tricyclics       301     Opiates          300     Cocaine          300  THC              50  URINALYSIS, ROUTINE W REFLEX MICROSCOPIC     Status: Abnormal   Collection Time    05/04/14  4:19 AM      Result Value Ref Range   Color, Urine YELLOW  YELLOW   APPearance CLEAR  CLEAR   Specific Gravity, Urine 1.010  1.005 - 1.030   pH 6.5  5.0 - 8.0   Glucose, UA NEGATIVE  NEGATIVE mg/dL   Hgb urine dipstick TRACE (*) NEGATIVE   Bilirubin Urine NEGATIVE  NEGATIVE   Ketones, ur TRACE (*) NEGATIVE mg/dL   Protein, ur NEGATIVE  NEGATIVE mg/dL   Urobilinogen, UA 0.2  0.0 - 1.0 mg/dL   Nitrite NEGATIVE  NEGATIVE   Leukocytes, UA NEGATIVE  NEGATIVE  URINE MICROSCOPIC-ADD ON     Status: None   Collection Time    05/04/14  4:19 AM      Result Value Ref Range   Squamous Epithelial / LPF RARE  RARE   WBC, UA 0-2  <3 WBC/hpf   RBC / HPF 3-6  <3 RBC/hpf   Bacteria, UA RARE  RARE  PHENOBARBITAL LEVEL     Status: Abnormal   Collection Time    05/04/14  5:01 AM      Result Value Ref Range   Phenobarbital 14.1 (*) 15.0 - 40.0 ug/mL  HEMOGLOBIN A1C     Status: Abnormal   Collection Time    05/04/14  5:05 AM      Result Value Ref Range   Hemoglobin A1C 6.2 (*) <5.7 %   Comment: (NOTE)                                                                               According to the ADA Clinical Practice Recommendations for 2011, when     HbA1c is used as a screening test:      >=6.5%   Diagnostic of Diabetes Mellitus               (if abnormal result is confirmed)     5.7-6.4%   Increased risk of developing Diabetes Mellitus     References:Diagnosis and Classification of Diabetes Mellitus,Diabetes     YOVZ,8588,50(YDXAJ 1):S62-S69 and Standards of Medical Care in             Diabetes - 2011,Diabetes Care,2011,34 (Suppl 1):S11-S61.   Mean Plasma Glucose 131 (*) <117 mg/dL   Comment: Performed at [place]     Status: None   Collection Time    05/04/14  5:05 AM      Result Value Ref Range   Cholesterol 190  0 - 200 mg/dL   Triglycerides 48  <150 mg/dL   HDL 99  >39 mg/dL   Total CHOL/HDL Ratio 1.9     VLDL 10  0 - 40 mg/dL   LDL Cholesterol 81  0 - 99 mg/dL   Comment:            Total Cholesterol/HDL:CHD Risk     Coronary Heart Disease Risk Table  Men   Women      1/2 Average Risk   3.4   3.3      Average Risk       5.0   4.4      2 X Average Risk   9.6   7.1      3 X Average Risk  23.4   11.0                Use the calculated Patient Ratio     above and the CHD Risk Table     to determine the patient's CHD Risk.                ATP III CLASSIFICATION (LDL):      <100     mg/dL   Optimal      100-129  mg/dL   Near or Above                        Optimal      130-159  mg/dL   Borderline      160-189  mg/dL   High      >190     mg/dL   Very High  RPR     Status: None   Collection Time    05/04/14  1:36 PM      Result Value Ref Range   RPR NON REAC  NON REAC   Comment: Performed at [place]     Status: None   Collection Time    05/04/14  1:36 PM      Result Value Ref Range   Vitamin B-12 564  211 - 911 pg/mL   Comment: Performed at [place]     Status: None   Collection Time    05/04/14  1:36 PM      Result Value Ref Range   Sed Rate 17  0 - 22 mm/hr  CBC     Status: None   Collection Time    05/04/14  1:36 PM      Result Value Ref Range   WBC 7.4  4.0 - 10.5 K/uL   RBC 4.04  3.87 - 5.11 MIL/uL   Hemoglobin 12.3  12.0 - 15.0 g/dL   HCT 36.2  36.0 - 46.0 %   MCV 89.6  78.0 - 100.0 fL   MCH 30.4  26.0 - 34.0 pg   MCHC 34.0  30.0 - 36.0 g/dL   RDW 14.2  11.5 - 15.5 %   Platelets 243  150 - 400 K/uL  FOLATE     Status: None   Collection Time    05/04/14  1:36 PM      Result Value Ref Range   Folate 17.9     Comment: (NOTE)     Reference Ranges             Deficient:       0.4 - 3.3 ng/mL            Indeterminate:   3.4 - 5.4 ng/mL            Normal:              > 5.4 ng/mL     Performed at [place]     Status: Abnormal   Collection Time    05/05/14  4:30 AM      Result Value  Ref Range   Sodium 140  137 - 147 mEq/L   Potassium 4.4  3.7 - 5.3 mEq/L   Chloride 102  96 - 112 mEq/L   CO2 28  19 - 32 mEq/L   Glucose, Bld 97  70 - 99 mg/dL   BUN 15  6 - 23 mg/dL   Creatinine, Ser 0.80  0.50 - 1.10 mg/dL   Calcium 9.1  8.4 - 10.5 mg/dL   GFR calc non Af Amer 75 (*) >90 mL/min   GFR calc Af Amer 87 (*) >90 mL/min   Comment: (NOTE)     The eGFR has been calculated using the CKD EPI equation.     This calculation has not been validated in all clinical situations.     eGFR's persistently <90 mL/min signify possible Chronic Kidney     Disease.   Anion gap 10  5 - 15  PHENOBARBITAL LEVEL     Status: Abnormal   Collection Time    05/05/14 12:18 PM      Result Value Ref Range   Phenobarbital 13.0 (*) 15.0 - 40.0 ug/mL    Lipid Panel  Recent Labs  05/04/14 0505  CHOL 190  TRIG 48  HDL 99  CHOLHDL 1.9  VLDL 10  LDLCALC 81    Studies/Results:  EEG This recording is abnormal showing focal left frontotemporal slowing indicating a focal cerebral disturbance. Additionally, there is overall less well formed activity over the left hemisphere.  BRAIN MRI The repeat brain MRI with contrast is reviewed in person. There is a mildly homogeneous enhancement of the extra-axial mass consistent with a meningioma. Additionally, there is the classic dural tail seen with this lesion.  Medications:  Scheduled Meds: . antiseptic oral rinse  7 mL Mouth Rinse BID  . aspirin  300 mg Rectal Daily   Or  . aspirin  325 mg Oral Daily  . atorvastatin  40 mg Oral Daily  . dexamethasone  10 mg Intravenous Q12H  . dexamethasone  6 mg Oral Daily  . enoxaparin (LOVENOX) injection  40 mg Subcutaneous Q24H  . FLUoxetine  10  mg Oral Daily  . Influenza vac split quadrivalent PF  0.5 mL Intramuscular Tomorrow-1000  . levothyroxine  25 mcg Oral QAC breakfast  . PHENobarbital  64.8 mg Oral QHS   Continuous Infusions:  PRN Meds:.labetalol, LORazepam     LOS: 2 days   Gracy Ehly A. [name], M.D.  Diplomate, Tax adviser of Psychiatry and Neurology ( Neurology).

## 2014-05-06 LAB — BASIC METABOLIC PANEL
ANION GAP: 11 (ref 5–15)
BUN: 12 mg/dL (ref 6–23)
CHLORIDE: 102 meq/L (ref 96–112)
CO2: 26 mEq/L (ref 19–32)
CREATININE: 0.68 mg/dL (ref 0.50–1.10)
Calcium: 9.4 mg/dL (ref 8.4–10.5)
GFR, EST NON AFRICAN AMERICAN: 89 mL/min — AB (ref >90)
Glucose, Bld: 117 mg/dL — ABNORMAL HIGH (ref 70–99)
Potassium: 4.2 mEq/L (ref 3.7–5.3)
Sodium: 139 mEq/L (ref 137–147)

## 2014-05-06 NOTE — Care Management Note (Addendum)
    Page 1 of 1   05/06/2014     12:53:41 PM CARE MANAGEMENT NOTE 05/06/2014  Patient:  [name],[name]   Account Number:  [id number]  Date Initiated:  05/06/2014  Documentation initiated by:  Theophilus Kinds  Subjective/Objective Assessment:   Pt admitted from home with CVA. Pt lives with her husband and will return home at discharge. Pt has a tub bench for home.     Action/Plan:   OT recommended outpt OT. Will arrange [facility] outpt clinic. Pt for discharge 05/06/14.   Anticipated DC Date:  05/06/2014   Anticipated DC Plan:  [place]  CM consult      Choice offered to / List presented to:             Status of service:  Completed, signed off Medicare Important Message given?  YES (If response is "NO", the following Medicare IM given date fields will be blank) Date Medicare IM given:  05/06/2014 Medicare IM given by:  Theophilus Kinds Date Additional Medicare IM given:   Additional Medicare IM given by:    Discharge Disposition:  HOME/SELF CARE  Per UR Regulation:    If discussed at Long Length of Stay Meetings, dates discussed:    Comments:  05/06/14 [place], RN BSN CM

## 2014-05-06 NOTE — Discharge Summary (Signed)
Physician Discharge Summary  [name] PXT:[id number] DOB: Jun 09, 1946 DOA: 05/03/2014  PCP: Brandi Curet, MD  Admit date: 05/03/2014 Discharge date: 05/06/2014   Recommendations for Outpatient Follow-up: Patient will follow up in office within one week's time  Discharge Diagnoses:  Active Problems:   CVA (cerebral infarction)  seizures. Hypertension.  Discharge Condition: Good.  Filed Weights   05/03/14 2115 05/04/14 0030 05/06/14 0400  Weight: 110 lb (49.896 kg) 110 lb 14.3 oz (50.3 kg) 109 lb 2 oz (49.5 kg)    History of present illness:  68 year old female with known history of seizure disorder multiple previous CVAs and frontal meningioma was admitted with altered mental status possible seizure activity she was metabolically intact hemodynamically stable and admitted for observation.  Hospital Course:  Patient was metabolically intact hemodynamically stable with no further witnessed seizure activity she was alert and oriented began progressively oriented over 40 hour. See in consultation by neurology EEG was essentially unrevealing for seizure activity was elected to continue her phenobarbital level which was 13 dementia workup was within normal parameters MRI MRA revealed frontal meningioma which is unchanged from previous studies multiple previous infarcts bilaterally.  Procedures:  None  Consultations:  Neurology.  Discharge Instructions  Discharge Instructions   Discharge instructions    Complete by:  As directed      Discharge patient    Complete by:  As directed             Medication List         amLODipine-benazepril 5-10 MG per capsule  Commonly known as:  LOTREL  Take 1 capsule by mouth daily.     atorvastatin 40 MG tablet  Commonly known as:  LIPITOR  Take 40 mg by mouth daily.     FLUoxetine 10 MG capsule  Commonly known as:  PROZAC  Take 10 mg by mouth daily.     labetalol 100 MG tablet  Commonly known as:  NORMODYNE  Take  100 mg by mouth 2 (two) times daily.     levothyroxine 25 MCG tablet  Commonly known as:  SYNTHROID, LEVOTHROID  Take 25 mcg by mouth daily before breakfast.     PHENobarbital 64.8 MG tablet  Commonly known as:  LUMINAL  Take 64.8 mg by mouth at bedtime.       Allergies  Allergen Reactions  . Codeine       The results of significant diagnostics from this hospitalization (including imaging, microbiology, ancillary and laboratory) are listed below for reference.    Significant Diagnostic Studies: Dg Chest 1 View  05/04/2014   CLINICAL DATA:  Stroke symptoms  EXAM: CHEST - 1 VIEW  COMPARISON:  None.  FINDINGS: The heart size and mediastinal contours are within normal limits. Both lungs are clear. The visualized skeletal structures are unremarkable.  IMPRESSION: No active disease.   Electronically Signed   By: Inez Catalina M.D.   On: 05/04/2014 10:10   Ct Head Wo Contrast  05/03/2014   CLINICAL DATA:  Code stroke, altered mental status, slurred speech  EXAM: CT HEAD WITHOUT CONTRAST  TECHNIQUE: Contiguous axial images were obtained from the base of the skull through the vertex without intravenous contrast.  COMPARISON:  None.  FINDINGS: Severely motion degraded images.  Hypodensity in the left basal ganglia is worrisome for acute/subacute infarct.  No evidence of parenchymal hemorrhage or extra-axial fluid collection.  No mass lesion, mass effect, or midline shift.  Dilatation of the occipital horns of the lateral ventricles.  The visualized paranasal sinuses are essentially clear. The mastoid air cells are unopacified.  No evidence of calvarial fracture.  IMPRESSION: Severely motion degraded images.  Hypodensity in the left basal ganglia is worrisome for acute/subacute infarct.  These results were called by telephone at the time of interpretation on 05/03/2014 at 10:18 pm to Dr. Nat Christen , who verbally acknowledged these results.   Electronically Signed   By: Julian Hy M.D.   On:  05/03/2014 22:19   Mr [name] Wo Contrast  05/04/2014   CLINICAL DATA:  New of new 68 year old female with altered mental status. History of seizures, strokes and high blood pressure.  EXAM: MRI HEAD WITHOUT CONTRAST  MRA HEAD WITHOUT CONTRAST  TECHNIQUE: Multiplanar, multiecho pulse sequences of the brain and surrounding structures were obtained without intravenous contrast. Angiographic images of the head were obtained using MRA technique without contrast.  COMPARISON:  05/03/2014 CT.  FINDINGS: MRI HEAD FINDINGS  Exam is motion degraded.  No acute infarct.  Remote hemorrhagic infarcts right parietal-occipital lobe and left parietal lobe with encephalomalacia and subsequent dilation of the atrium of the right lateral ventricle. Remote infarct with encephalomalacia left lenticular nucleus.  Tiny blood breakdown products right pons probably related to prior episode of hemorrhagic ischemia. Small cavernoma felt to be less likely consideration.  Left frontal opercular region 2.7 x 2.7 x 2.3 cm extra-axial mass most consistent with a meningioma with vasogenic edema within the left frontal lobe and superior aspect of the left basal ganglia.  Baseline atrophy without hydrocephalus.  Partially empty non expanded sella.  Major intracranial vascular structures are patent.  Minimal mucosal thickening mastoid air cells and ethmoid sinus air cells.  Cervical medullary junction, pineal region and orbital structures grossly within normal limits.  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  Prominent infundibulum left posterior communicating artery origin region.  Mild narrowing and irregularity of the cavernous segment of the internal carotid artery greater on the left.  Middle cerebral artery branch vessel moderate narrowing and irregularity greater on the left.  Ectatic vertebral arteries and basilar artery. Mild narrowing portions of the distal right vertebral artery without  high-grade stenosis.  Nonvisualized left anterior inferior cerebellar artery.  Moderate to marked narrowing mid to distal aspect of the posterior cerebral artery bilaterally.  IMPRESSION: MRI HEAD:  Exam is motion degraded.  Left frontal opercular region 2.7 x 2.7 x 2.3 cm extra-axial mass most consistent with a meningioma with vasogenic edema within the left frontal lobe and superior aspect of the left basal ganglia.  No acute infarct.  Remote hemorrhagic infarcts right parietal-occipital lobe and left parietal lobe with encephalomalacia and subsequent dilation of the atrium of the right lateral ventricle. Remote infarct with encephalomalacia left lenticular nucleus.  Tiny blood breakdown products right pons probably related to prior episode of hemorrhagic ischemia. Small cavernoma felt to be less likely consideration.  Baseline atrophy without hydrocephalus.  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  Prominent infundibulum left posterior communicating artery origin region.  Mild narrowing and irregularity of the cavernous segment of the internal carotid artery greater on the left.  Middle cerebral artery branch vessel moderate narrowing and irregularity greater on the left.  Ectatic vertebral arteries and basilar artery. Mild narrowing portions of the distal right vertebral artery without high-grade stenosis.  Nonvisualized left anterior inferior cerebellar artery.  Moderate to marked narrowing mid to distal aspect  of the posterior cerebral artery bilaterally.  These results will be called to the ordering clinician or representative by the Radiologist Assistant, and communication documented in the PACS or zVision Dashboard.   Electronically Signed   By: Chauncey Cruel M.D.   On: 05/04/2014 11:29   Mr Brain Wo Contrast  05/04/2014   CLINICAL DATA:  New of new 68 year old female with altered mental status. History of seizures, strokes and high blood pressure.  EXAM: MRI HEAD  WITHOUT CONTRAST  MRA HEAD WITHOUT CONTRAST  TECHNIQUE: Multiplanar, multiecho pulse sequences of the brain and surrounding structures were obtained without intravenous contrast. Angiographic images of the head were obtained using MRA technique without contrast.  COMPARISON:  05/03/2014 CT.  FINDINGS: MRI HEAD FINDINGS  Exam is motion degraded.  No acute infarct.  Remote hemorrhagic infarcts right parietal-occipital lobe and left parietal lobe with encephalomalacia and subsequent dilation of the atrium of the right lateral ventricle. Remote infarct with encephalomalacia left lenticular nucleus.  Tiny blood breakdown products right pons probably related to prior episode of hemorrhagic ischemia. Small cavernoma felt to be less likely consideration.  Left frontal opercular region 2.7 x 2.7 x 2.3 cm extra-axial mass most consistent with a meningioma with vasogenic edema within the left frontal lobe and superior aspect of the left basal ganglia.  Baseline atrophy without hydrocephalus.  Partially empty non expanded sella.  Major intracranial vascular structures are patent.  Minimal mucosal thickening mastoid air cells and ethmoid sinus air cells.  Cervical medullary junction, pineal region and orbital structures grossly within normal limits.  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  Prominent infundibulum left posterior communicating artery origin region.  Mild narrowing and irregularity of the cavernous segment of the internal carotid artery greater on the left.  Middle cerebral artery branch vessel moderate narrowing and irregularity greater on the left.  Ectatic vertebral arteries and basilar artery. Mild narrowing portions of the distal right vertebral artery without high-grade stenosis.  Nonvisualized left anterior inferior cerebellar artery.  Moderate to marked narrowing mid to distal aspect of the posterior cerebral artery bilaterally.  IMPRESSION: MRI HEAD:  Exam is  motion degraded.  Left frontal opercular region 2.7 x 2.7 x 2.3 cm extra-axial mass most consistent with a meningioma with vasogenic edema within the left frontal lobe and superior aspect of the left basal ganglia.  No acute infarct.  Remote hemorrhagic infarcts right parietal-occipital lobe and left parietal lobe with encephalomalacia and subsequent dilation of the atrium of the right lateral ventricle. Remote infarct with encephalomalacia left lenticular nucleus.  Tiny blood breakdown products right pons probably related to prior episode of hemorrhagic ischemia. Small cavernoma felt to be less likely consideration.  Baseline atrophy without hydrocephalus.  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  Prominent infundibulum left posterior communicating artery origin region.  Mild narrowing and irregularity of the cavernous segment of the internal carotid artery greater on the left.  Middle cerebral artery branch vessel moderate narrowing and irregularity greater on the left.  Ectatic vertebral arteries and basilar artery. Mild narrowing portions of the distal right vertebral artery without high-grade stenosis.  Nonvisualized left anterior inferior cerebellar artery.  Moderate to marked narrowing mid to distal aspect of the posterior cerebral artery bilaterally.  These results will be called to the ordering clinician or representative by the Radiologist Assistant, and communication documented in the PACS or zVision Dashboard.   Electronically  Signed   By: Chauncey Cruel M.D.   On: 05/04/2014 11:29   Mr Jeri Cos Contrast  05/05/2014   CLINICAL DATA:  Follow-up brain tumor. Altered mental status. Extra-axial mass seen on recent noncontrast brain MRI.  EXAM: MRI HEAD WITH CONTRAST  TECHNIQUE: Multiplanar, multiecho pulse sequences of the brain and surrounding structures were obtained with intravenous contrast.  COMPARISON:  Noncontrast brain MRI 05/04/2014  CONTRAST:  7mL MULTIHANCE  GADOBENATE DIMEGLUMINE 529 MG/ML IV SOLN  FINDINGS: As described on recent noncontrast brain MRI, there is an extra-axial mass overlying the left frontal operculum which demonstrates moderate homogeneous enhancement and measures 2.7 x 2.4 x 2.6 cm (AP by transverse by craniocaudal). There is an associated dural tail/dural thickening extending from the mass. There is local mass effect on the operculum with small to moderate amount of associated vasogenic edema in the adjacent brain parenchyma. There is no midline shift. No masslike enhancement is seen elsewhere.  Encephalomalacia is again seen involving the right greater than left parietal lobes. There is a small amount of predominantly curvilinear enhancement in these regions, right greater than left, which may reflect late subacute ischemia as well as vascular enhancement. It is also possible that some of this reflects intrinsic T1 hyperintensity, such as from mineralization, rather than enhancement, with evaluation somewhat limited by motion on the prior study and lack of precontrast T1 images on today's examination for direct comparison.  Ex vacuo dilatation of the right lateral ventricle is noted in addition to remote left lentiform nucleus infarct and likely old right pontine infarct.  IMPRESSION: 1. Enhancing left frontal a particular extra-axial mass, consistent with a meningioma. 2. Old bilateral parietal infarcts as above.   Electronically Signed   By: Logan Bores   On: 05/05/2014 11:47   US Carotid Bilateral  05/04/2014   CLINICAL DATA:  68 year old female with a history of stroke  EXAM: BILATERAL CAROTID DUPLEX ULTRASOUND  TECHNIQUE: Pearline Cables scale imaging, color Doppler and duplex ultrasound were performed of bilateral carotid and vertebral arteries in the neck.  COMPARISON:  No prior duplex for comparison. CT of the brain 05/03/2014. MRI completed on the same date.  FINDINGS: Criteria: Quantification of carotid stenosis is based on velocity parameters  that correlate the residual internal carotid diameter with NASCET-based stenosis levels, using the diameter of the distal internal carotid lumen as the denominator for stenosis measurement.  The following velocity measurements were obtained:  RIGHT  ICA:  Systolic 69 cm/sec, Diastolic 21 cm/sec  CCA:  49 cm/sec  SYSTOLIC ICA/CCA RATIO:  1.4  DIASTOLIC ICA/CCA RATIO:  1.8  ECA:  57 cm/sec  LEFT  ICA:  Systolic 54 cm/sec, Diastolic 20 cm/sec  CCA:  58 cm/sec  SYSTOLIC ICA/CCA RATIO:  2.44  DIASTOLIC ICA/CCA RATIO:  1.6  ECA:  80 cm/sec  RIGHT CAROTID ARTERY: Changes of atherosclerosis involving the right common carotid artery and carotid bulb with mild -moderate calcifications. Intermediate waveform maintained within the common carotid artery. Right internal carotid artery demonstrates heterogeneous plaque with associated calcifications. Low resistance waveform maintained.  RIGHT VERTEBRAL ARTERY:  Antegrade flow with low resistance waveform  LEFT CAROTID ARTERY: Atherosclerotic changes of the left common carotid artery, carotid bulb, and internal carotid artery. Waveforms within normal limits. No significant calcifications.  LEFT VERTEBRAL ARTERY:  Antegrade flow with low resistance waveform.  IMPRESSION: Moderate heterogeneous and partially calcified plaque of the bilateral extracranial cerebral vascular circulation without significant stenosis identified by duplex criteria.  Signed,  Dulcy Fanny. Earleen Newport, DO  [facility]  [facility]   Electronically Signed   By: Corrie Mckusick O.D.   On: 05/04/2014 11:34    Microbiology: Recent Results (from the past 240 hour(s))  MRSA PCR SCREENING     Status: None   Collection Time    05/04/14  1:33 AM      Result Value Ref Range Status   MRSA by PCR NEGATIVE  NEGATIVE Final   Comment:            The GeneXpert MRSA Assay (FDA     approved for NASAL specimens     only), is one component of a     comprehensive MRSA  colonization     surveillance program. It is not     intended to diagnose MRSA     infection nor to guide or     monitor treatment for     MRSA infections.     Labs: Basic Metabolic Panel:  Recent Labs Lab 05/03/14 2115 05/03/14 2143 05/05/14 0430 05/06/14 0458  NA 138 135* 140 139  K 4.5 4.4 4.4 4.2  CL 98 105 102 102  CO2 26  --  28 26  GLUCOSE 114* 115* 97 117*  BUN 10 9 15 12   CREATININE 0.70 0.80 0.80 0.68  CALCIUM 9.6  --  9.1 9.4   Liver Function Tests: No results found for this basename: AST, ALT, ALKPHOS, BILITOT, PROT, ALBUMIN,  in the last 168 hours No results found for this basename: LIPASE, AMYLASE,  in the last 168 hours No results found for this basename: AMMONIA,  in the last 168 hours CBC:  Recent Labs Lab 05/03/14 2115 05/03/14 2143 05/04/14 1336  WBC 6.4  --  7.4  NEUTROABS 2.8  --   --   HGB 12.9 13.9 12.3  HCT 37.9 41.0 36.2  MCV 89.8  --  89.6  PLT 265  --  243   Cardiac Enzymes: No results found for this basename: CKTOTAL, CKMB, CKMBINDEX, TROPONINI,  in the last 168 hours BNP: BNP (last 3 results) No results found for this basename: PROBNP,  in the last 8760 hours CBG: No results found for this basename: GLUCAP,  in the last 168 hours     Signed:  Maricela [name]  [facility] Pager: [phone] 05/06/2014, 12:29 PM

## 2014-05-06 NOTE — Progress Notes (Signed)
Speech Language Pathology Treatment: Dysphagia  Patient Details Name: [name] MRN: [id number] DOB: 05/20/1946 Today's Date: 05/06/2014 Time: 7793-9030 SLP Time Calculation (min): 18 min  Assessment / Plan / Recommendation Clinical Impression  Pt much more alert and with less confusion today. She was able to self feed and tolerate trials of regular textures with dentures in place. Pt with mild pocketing in right buccal cavity and needed cues to alternate with sips of liquid. Recommend upgrade to D3/mech soft and thin liquids. No further SLP f/u indicated at this time. Above to RN   HPI HPI: Pt is a 68 year old female who was admitted with a sudden change in mental status and right sided weakness.  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  Per chart, pt has a history of seizure.  She is severely HOH.  Per family report in chart, pt is normally able to ambulate and transfer at home without difficulty.    Pertinent Vitals Pain Assessment: No/denies pain  SLP Plan  All goals met;Discharge SLP treatment due to (comment) (goals met)    Recommendations Diet recommendations: Dysphagia 3 (mechanical soft);Thin liquid Liquids provided via: Cup;Straw Medication Administration: Whole meds with puree Supervision: Intermittent supervision to cue for compensatory strategies Compensations: Check for pocketing Postural Changes and/or Swallow Maneuvers: Out of bed for meals;Seated upright 90 degrees;Upright 30-60 min after meal              Oral Care Recommendations: Oral care BID Follow up Recommendations: None Plan: All goals met;Discharge SLP treatment due to (comment) (goals met)    GO     [name] 05/06/2014, 9:21 AM

## 2014-05-11 ENCOUNTER — Ambulatory Visit (HOSPITAL_COMMUNITY)
Admission: RE | Admit: 2014-05-11 | Discharge: 2014-05-11 | Disposition: A | Discharge status: Home / Self Care | Payer: Medicare HMO | Source: Ambulatory Visit | Attending: Family Medicine | Admitting: Family Medicine

## 2014-05-11 DIAGNOSIS — I69398 Other sequelae of cerebral infarction: Secondary | ICD-10-CM | POA: Insufficient documentation

## 2014-05-11 DIAGNOSIS — Z5189 Encounter for other specified aftercare: Secondary | ICD-10-CM | POA: Diagnosis present

## 2014-05-11 NOTE — Evaluation (Signed)
Occupational Therapy Evaluation  Patient Details  Name: BERDENE ASKARI MRN: [id number] Date of Birth: 04-22-1946  Today's Date: 05/11/2014 Time: 6712-4580 OT Time Calculation (min): 25 min Eval 25'  Visit#: 1 of 1  Re-eval:    Assessment Diagnosis: Seizures/Mini-Strokes Surgical Date:  (n/a) Next MD Visit: 6 weeks, November 10th Prior Therapy: Acute  Authorization: [employer]  Authorization Time Period: Before 10th visit  Authorization Visit#: 1 of     Past Medical History:  Past Medical History  Diagnosis Date  . Seizures   . Hypertension   . Stroke 2009, 2015 9/27   Past Surgical History:  Past Surgical History  Procedure Laterality Date  . Peg tube placement    . Peg tube removal      Subjective Symptoms/Limitations Symptoms: "I feel just fine!" Pertinent History: Pt is 75 you female with history of storkes in both 2006 and 2009.  Pt presents today for outpatient evaluation after CVA.  Family reports that MD stated she did not actually have CVA, but seizures and mini strokes.  Family reports that pt has been receiving assist with ADLs and IADLs at basline.   Special Tests: FOTO 42/100 (Family report) Patient Stated Goals: No OT goals needed Pain Assessment Currently in Pain?: No/denies  Precautions/Restrictions  Precautions Precautions: Fall Restrictions Weight Bearing Restrictions: No  Prior [place] expects to be discharged to:: Private residence Available Help at Discharge: Family Prior Function Level of Independence: Needs assistance with ADLs;Needs assistance with homemaking (Family assist with dressing, grooming, shower t/f.) Leisure: Hobbies-yes (Comment) (Watching TV, drinking coffee, chewing gum walking) Comments: Pt does not engage in IADLs around the home. Family assist with all needs at baseline.  Assessment ADL/Vision/Perception ADL ADL Comments: Family and pt reports that pt is at baseline functioning.   She has required incrased assist with ADLs since CVAs in 2006 and 2009. Dominant Hand: Right Vision - History Baseline Vision: Wears glasses all the time  Cognition/Observation Cognition Overall Cognitive Status: History of cognitive impairments - at baseline Arousal/Alertness: Awake/alert  Sensation/Coordination/Edema Coordination Gross Motor Movements are Fluid and Coordinated: Yes Fine Motor Movements are Fluid and Coordinated: Yes  Additional Assessments RUE Assessment RUE Assessment: Within Functional Limits RUE Strength Grip (lbs): 32 LUE Assessment LUE Assessment: Within Functional Limits LUE Strength Grip (lbs): 15    Occupational Therapy Assessment and Plan OT Assessment and Plan Clinical Impression Statement: Pt is presenting to outpatient OT s/p seizures and mini strokes on 9/27.  Pt has no residual weakness from event and family reports that she has returned to baseline functioning in ADL needs.  Pt and family have no further concerns about pt's strenght, ROM, or ADL functioning.  Pt does not need further OT services at this time, OT Plan: Pt is discharged from OT services.     Goals  No OT goals needed.  Problem List Patient Active Problem List   Diagnosis Date Noted  . CVA (cerebral infarction) 05/03/2014    End of Session Activity Tolerance: Patient tolerated treatment well General Behavior During Therapy: [facility] for tasks assessed/performed  GO Functional Assessment Tool Used: FOTO (family report) 42/100 Functional Limitation: Self care Self Care Current Status (D9833): At least 40 percent but less than 60 percent impaired, limited or restricted Self Care Goal Status (A2505): At least 40 percent but less than 60 percent impaired, limited or restricted Self Care Discharge Status [phone]): At least 40 percent but less than 60 percent impaired, limited or restricted   Lelan Pons  Wandra Scot, [place], OTR/[place] [phone] 05/11/2014, 3:32 PM   Physician Documentation Your signature is required to indicate approval of the treatment plan as stated above.  Please sign and either send electronically or make a copy of this report for your files and return this physician signed original.  Please mark one 1.__approve of plan  2. ___approve of plan with the following conditions.   ______________________________                                                          _____________________ Physician Signature                                                                                                             Date

## 2014-11-10 ENCOUNTER — Encounter (HOSPITAL_COMMUNITY): Payer: Self-pay | Admitting: Emergency Medicine

## 2014-11-10 ENCOUNTER — Emergency Department (HOSPITAL_COMMUNITY): Payer: Medicare HMO

## 2014-11-10 ENCOUNTER — Observation Stay (HOSPITAL_COMMUNITY)
Admission: EM | Admit: 2014-11-10 | Discharge: 2014-11-13 | Disposition: A | Discharge status: Home / Self Care | Payer: Medicare HMO | Attending: Family Medicine | Admitting: Family Medicine

## 2014-11-10 DIAGNOSIS — R4701 Aphasia: Secondary | ICD-10-CM | POA: Insufficient documentation

## 2014-11-10 DIAGNOSIS — R4781 Slurred speech: Secondary | ICD-10-CM | POA: Insufficient documentation

## 2014-11-10 DIAGNOSIS — G40909 Epilepsy, unspecified, not intractable, without status epilepticus: Secondary | ICD-10-CM | POA: Diagnosis not present

## 2014-11-10 DIAGNOSIS — F419 Anxiety disorder, unspecified: Secondary | ICD-10-CM | POA: Diagnosis not present

## 2014-11-10 DIAGNOSIS — D329 Benign neoplasm of meninges, unspecified: Secondary | ICD-10-CM

## 2014-11-10 DIAGNOSIS — I10 Essential (primary) hypertension: Secondary | ICD-10-CM | POA: Insufficient documentation

## 2014-11-10 DIAGNOSIS — Z79899 Other long term (current) drug therapy: Secondary | ICD-10-CM | POA: Insufficient documentation

## 2014-11-10 DIAGNOSIS — R2990 Unspecified symptoms and signs involving the nervous system: Secondary | ICD-10-CM

## 2014-11-10 DIAGNOSIS — I639 Cerebral infarction, unspecified: Secondary | ICD-10-CM

## 2014-11-10 DIAGNOSIS — Z8673 Personal history of transient ischemic attack (TIA), and cerebral infarction without residual deficits: Secondary | ICD-10-CM | POA: Diagnosis not present

## 2014-11-10 DIAGNOSIS — Z7982 Long term (current) use of aspirin: Secondary | ICD-10-CM | POA: Insufficient documentation

## 2014-11-10 DIAGNOSIS — F1310 Sedative, hypnotic or anxiolytic abuse, uncomplicated: Secondary | ICD-10-CM | POA: Diagnosis not present

## 2014-11-10 DIAGNOSIS — R29810 Facial weakness: Principal | ICD-10-CM | POA: Insufficient documentation

## 2014-11-10 DIAGNOSIS — Z720 Tobacco use: Secondary | ICD-10-CM | POA: Insufficient documentation

## 2014-11-10 DIAGNOSIS — I6300 Cerebral infarction due to thrombosis of unspecified precerebral artery: Secondary | ICD-10-CM

## 2014-11-10 HISTORY — DX: Disorder of thyroid, unspecified: E07.9

## 2014-11-10 LAB — DIFFERENTIAL
BASOS ABS: 0 10*3/uL (ref 0.0–0.1)
Basophils Relative: 0 % (ref 0–1)
Eosinophils Absolute: 0.1 10*3/uL (ref 0.0–0.7)
Eosinophils Relative: 1 % (ref 0–5)
LYMPHS PCT: 36 % (ref 12–46)
Lymphs Abs: 3.1 10*3/uL (ref 0.7–4.0)
MONO ABS: 0.8 10*3/uL (ref 0.1–1.0)
MONOS PCT: 10 % (ref 3–12)
NEUTROS ABS: 4.5 10*3/uL (ref 1.7–7.7)
NEUTROS PCT: 53 % (ref 43–77)

## 2014-11-10 LAB — CBC
HCT: 37.2 % (ref 36.0–46.0)
Hemoglobin: 12.7 g/dL (ref 12.0–15.0)
MCH: 31.1 pg (ref 26.0–34.0)
MCHC: 34.1 g/dL (ref 30.0–36.0)
MCV: 91 fL (ref 78.0–100.0)
PLATELETS: 236 10*3/uL (ref 150–400)
RBC: 4.09 MIL/uL (ref 3.87–5.11)
RDW: 14 % (ref 11.5–15.5)
WBC: 8.5 10*3/uL (ref 4.0–10.5)

## 2014-11-10 LAB — I-STAT CHEM 8, ED
BUN: 14 mg/dL (ref 6–23)
CALCIUM ION: 1.14 mmol/L (ref 1.13–1.30)
Chloride: 103 mmol/L (ref 96–112)
Creatinine, Ser: 0.7 mg/dL (ref 0.50–1.10)
Glucose, Bld: 128 mg/dL — ABNORMAL HIGH (ref 70–99)
HEMATOCRIT: 40 % (ref 36.0–46.0)
Hemoglobin: 13.6 g/dL (ref 12.0–15.0)
Potassium: 3.8 mmol/L (ref 3.5–5.1)
Sodium: 139 mmol/L (ref 135–145)
TCO2: 22 mmol/L (ref 0–100)

## 2014-11-10 LAB — COMPREHENSIVE METABOLIC PANEL
ALK PHOS: 45 U/L (ref 39–117)
ALT: 12 U/L (ref 0–35)
AST: 18 U/L (ref 0–37)
Albumin: 4.3 g/dL (ref 3.5–5.2)
Anion gap: 8 (ref 5–15)
BUN: 14 mg/dL (ref 6–23)
CALCIUM: 9.1 mg/dL (ref 8.4–10.5)
CO2: 25 mmol/L (ref 19–32)
Chloride: 103 mmol/L (ref 96–112)
Creatinine, Ser: 0.8 mg/dL (ref 0.50–1.10)
GFR, EST AFRICAN AMERICAN: 87 mL/min — AB (ref >90)
GFR, EST NON AFRICAN AMERICAN: 75 mL/min — AB (ref >90)
GLUCOSE: 129 mg/dL — AB (ref 70–99)
POTASSIUM: 3.4 mmol/L — AB (ref 3.5–5.1)
SODIUM: 136 mmol/L (ref 135–145)
Total Bilirubin: 0.4 mg/dL (ref 0.3–1.2)
Total Protein: 7.5 g/dL (ref 6.0–8.3)

## 2014-11-10 LAB — URINALYSIS, ROUTINE W REFLEX MICROSCOPIC
Bilirubin Urine: NEGATIVE
GLUCOSE, UA: NEGATIVE mg/dL
KETONES UR: NEGATIVE mg/dL
LEUKOCYTES UA: NEGATIVE
Nitrite: NEGATIVE
PROTEIN: NEGATIVE mg/dL
Specific Gravity, Urine: 1.015 (ref 1.005–1.030)
UROBILINOGEN UA: 0.2 mg/dL (ref 0.0–1.0)
pH: 5.5 (ref 5.0–8.0)

## 2014-11-10 LAB — I-STAT TROPONIN, ED: Troponin i, poc: 0 ng/mL (ref 0.00–0.08)

## 2014-11-10 LAB — RAPID URINE DRUG SCREEN, HOSP PERFORMED
Amphetamines: NOT DETECTED
BARBITURATES: POSITIVE — AB
Benzodiazepines: NOT DETECTED
COCAINE: NOT DETECTED
Opiates: NOT DETECTED
Tetrahydrocannabinol: NOT DETECTED

## 2014-11-10 LAB — CBG MONITORING, ED: GLUCOSE-CAPILLARY: 114 mg/dL — AB (ref 70–99)

## 2014-11-10 LAB — PROTIME-INR
INR: 1.04 (ref 0.00–1.49)
PROTHROMBIN TIME: 13.7 s (ref 11.6–15.2)

## 2014-11-10 LAB — APTT: aPTT: 26 seconds (ref 24–37)

## 2014-11-10 LAB — URINE MICROSCOPIC-ADD ON

## 2014-11-10 LAB — ETHANOL: Alcohol, Ethyl (B): <5 mg/dL (ref 0–9)

## 2014-11-10 NOTE — ED Notes (Signed)
Patient has receptive and expressive aphasia.

## 2014-11-10 NOTE — ED Notes (Signed)
cbg of 114. 

## 2014-11-10 NOTE — ED Notes (Signed)
Pt. Drooling. Pt. Mouth suctioned.

## 2014-11-10 NOTE — ED Notes (Signed)
Pt. Speech incomprehensible. Dr. Dina Rich notified.

## 2014-11-10 NOTE — ED Provider Notes (Signed)
CSN: [id number]     Arrival date & time 11/10/14  2046 History  This chart was scribed for Merryl Hacker, MD by Hilda Lias, ED Scribe. This patient was seen in room APA03/APA03 and the patient's care was started at 10:34 PM.    Chief Complaint  Patient presents with  . Code Stroke    LEVEL 5 CAVEAT   HPI   HPI Comments: [name] is a 69 y.o. female who presents to the Emergency Department complaining of slurred speech and left facial droop. History of prior stroke. On aspirin daily. Husband and sister noted approximately 20 minutes prior to arrival at 8:30 PM that the patient developed slurred speech.  They report that her prior stroke deficits left upper extremity. Patient was evaluated me proximally 5 minutes after arrival. She appears to have both receptive and expressive aphasia with left-sided facial droop. Code stroke was initiated  Level V caveat for acuity of condition  After initiation of code stroke, chart review performed. Patient with history of stroke and seizure. Last admission was for a possible stroke and she had a full neurologic evaluation which neurology believed her altered mental status was related to seizure. 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     Past Medical History  Diagnosis Date  . Seizures   . Hypertension   . Stroke 2009, 2015 9/27   Past Surgical History  Procedure Laterality Date  . Peg tube placement    . Peg tube removal     No family history on file. History  Substance Use Topics  . Smoking status: Current Every Day Smoker    Types: Cigarettes  . Smokeless tobacco: Not on file  . Alcohol Use: No   OB History    No data available     Review of Systems  Unable to perform ROS: Acuity of condition      Allergies  Codeine  Home Medications   Prior to Admission medications   Medication Sig Start Date End Date Taking? Authorizing Provider   amLODipine-benazepril (LOTREL) 5-10 MG per capsule Take 1 capsule by mouth daily.   Yes Historical Provider, MD  aspirin EC 81 MG tablet Take 81 mg by mouth every evening.   Yes Historical Provider, MD  atorvastatin (LIPITOR) 40 MG tablet Take 40 mg by mouth every evening.    Yes Historical Provider, MD  labetalol (NORMODYNE) 100 MG tablet Take 100 mg by mouth 2 (two) times daily.   Yes Historical Provider, MD  levothyroxine (SYNTHROID, LEVOTHROID) 25 MCG tablet Take 25 mcg by mouth daily before breakfast.   Yes Historical Provider, MD  PHENobarbital (LUMINAL) 64.8 MG tablet Take 64.8 mg by mouth at bedtime.   Yes Historical Provider, MD  FLUoxetine (PROZAC) 10 MG capsule Take 10 mg by mouth daily.    Historical Provider, MD  PARoxetine (PAXIL) 10 MG tablet  10/26/14   Historical Provider, MD   BP 130/96 mmHg  Pulse 68  Temp(Src) 98.5 F (36.9 C) (Oral)  Resp 19  Ht 5\' 5"  (1.651 m)  Wt 110 lb (49.896 kg)  BMI 18.31 kg/m2  SpO2 98% Physical Exam  Constitutional: She appears distressed.  Appears frustrated  HENT:  Head: Normocephalic and atraumatic.  Left facial droop  Eyes: Pupils are equal, round, and reactive to light.  Cardiovascular: Normal rate, regular rhythm and normal heart sounds.   No murmur heard. Pulmonary/Chest: Effort normal and breath sounds normal. No  respiratory distress. She has no wheezes.  Musculoskeletal:  Contracture left upper extremity  Neurological: She is alert.  Appears frustrated, will not follow commands and does not appear to understand, aphasia present, appears to move right upper and bilateral lower extremities spontaneously without difficulty  Skin: Skin is warm and dry.  Psychiatric:  Anxious  Nursing note and vitals reviewed.   ED Course  Procedures (including critical care time) Labs Review Labs Reviewed  COMPREHENSIVE METABOLIC PANEL - Abnormal; Notable for the following:    Potassium 3.4 (*)    Glucose, Bld 129 (*)    GFR calc non Af  Amer 75 (*)    GFR calc Af Amer 87 (*)    All other components within normal limits  I-STAT CHEM 8, ED - Abnormal; Notable for the following:    Glucose, Bld 128 (*)    All other components within normal limits  CBG MONITORING, ED - Abnormal; Notable for the following:    Glucose-Capillary 114 (*)    All other components within normal limits  ETHANOL  PROTIME-INR  APTT  CBC  DIFFERENTIAL  URINE RAPID DRUG SCREEN (HOSP PERFORMED)  URINALYSIS, ROUTINE W REFLEX MICROSCOPIC  I-STAT TROPOININ, ED  I-STAT TROPOININ, ED    Imaging Review Ct Head Wo Contrast  11/10/2014   CLINICAL DATA:  Sudden onset left-sided weakness with expressive aphasia ; left-sided mouth drooping  EXAM: CT HEAD WITHOUT CONTRAST  TECHNIQUE: Contiguous axial images were obtained from the base of the skull through the vertex without intravenous contrast.  COMPARISON:  September 24, 2006  FINDINGS: Enlargement of the atria of the lateral ventricles, particularly on the right, remain stable. This enlargement is felt to be due to encephalomalacia. There is no change in ventricular size and configuration compared to prior study. There is no intracranial mass, hemorrhage, extra-axial fluid collection, or midline shift. There is evidence of a prior infarct in each parietal lobe, larger on the right than on the left, stable. There is small vessel disease throughout the centra semiovale bilaterally. There is decreased attenuation in the region of the left mid insular cortex concerning for potential acute infarct. There is also decreased attenuation in the left lentiform nucleus which may be indicative of recent/acute infarct. Each middle cerebral artery has normal attenuation. The bony calvarium appears intact. The mastoid air cells are clear.  IMPRESSION: Question recent/acute infarct in the left insular cortex and lentiform nucleus. There is small vessel disease throughout the centra semiovale bilaterally. Prior infarcts in both parietal  lobes, larger on the right than on the left, stable. No hemorrhage or mass effect. Areas of encephalomalacia with enlargement of the atria of the lateral ventricles, particularly on the right, stable.  Critical Value/emergent results were called by telephone at the time of interpretation on 11/10/2014 at 9:09 pm to Dr. Thayer Jew , who verbally acknowledged these results.   Electronically Signed   By: Lowella Grip III M.D.   On: 11/10/2014 21:09     EKG Interpretation   Date/Time:  Tuesday November 10 2014 21:08:59 EDT Ventricular Rate:  89 PR Interval:  158 QRS Duration: 84 QT Interval:  374 QTC Calculation: 455 R Axis:   -28 Text Interpretation:  Sinus rhythm Borderline left axis deviation Low  voltage, extremity leads Abnormal R-wave progression, early transition  Confirmed by Chaquetta Schlottman  MD, S[place]ent ([id number]) on 11/10/2014 10:35:04 PM      MDM   Final diagnoses:  Stroke-like symptoms    Patient presents with strokelike symptoms. Aphasia  and dysarthria on initial evaluation. History of multiple hemorrhagic strokes as well as ischemic stroke and seizure. Code stroke initiated. CT scan without blood but does have a questionable recent/acute infarct of the left insular cortex and lentiform nucleus. Patient is not a TPA candidate given prior hemorrhagic history. Was assessed by tele neurology. Patient to be admitted for a full stroke workup. Discussed with Dr. [name] who agrees. The patient will need MRI/MRA. She should be continued on her aspirin.  Aphasia improving on repeat evaluation.  Will admit to the hospitalist.    Merryl Hacker, MD 11/10/14 2237

## 2014-11-10 NOTE — Progress Notes (Signed)
Time of beeper for code stroke: 2052 Time of call from ER: 2054 Time I called [facility] and spoke to [place]: 2103 Images sent to [facility]: 2104 Angelina from [place] called to remind me to send images to [facility]: 2106

## 2014-11-10 NOTE — ED Notes (Signed)
Pt. Speech improving. Receptive and expressive aphasia no longer present. Pt. Speech is still slurred but can be understood.

## 2014-11-10 NOTE — ED Notes (Signed)
SOC was consulted @ 2058.

## 2014-11-10 NOTE — ED Notes (Signed)
Dr Le at bedside.  

## 2014-11-10 NOTE — ED Notes (Signed)
Pt. Requesting water. Explained to pt. That she cannot have anything to eat or drink until she has swallow evaluation by ST

## 2014-11-10 NOTE — ED Notes (Signed)
Patient presents to ER with slurred speech and left sided facial droop.

## 2014-11-11 ENCOUNTER — Observation Stay (HOSPITAL_COMMUNITY): Payer: Medicare HMO

## 2014-11-11 ENCOUNTER — Encounter (HOSPITAL_COMMUNITY): Payer: Self-pay | Admitting: Emergency Medicine

## 2014-11-11 DIAGNOSIS — Z720 Tobacco use: Secondary | ICD-10-CM

## 2014-11-11 DIAGNOSIS — I6350 Cerebral infarction due to unspecified occlusion or stenosis of unspecified cerebral artery: Secondary | ICD-10-CM | POA: Diagnosis not present

## 2014-11-11 DIAGNOSIS — I10 Essential (primary) hypertension: Secondary | ICD-10-CM | POA: Diagnosis not present

## 2014-11-11 LAB — PHENOBARBITAL LEVEL
Phenobarbital: 8.8 ug/mL — ABNORMAL LOW (ref 15.0–40.0)
Phenobarbital: 8.7 ug/mL — ABNORMAL LOW (ref 15.0–40.0)

## 2014-11-11 LAB — RAPID URINE DRUG SCREEN, HOSP PERFORMED
AMPHETAMINES: NOT DETECTED
BENZODIAZEPINES: NOT DETECTED
Barbiturates: POSITIVE — AB
COCAINE: NOT DETECTED
OPIATES: NOT DETECTED
Tetrahydrocannabinol: NOT DETECTED

## 2014-11-11 LAB — CBC
HEMATOCRIT: 33.8 % — AB (ref 36.0–46.0)
HEMOGLOBIN: 11.4 g/dL — AB (ref 12.0–15.0)
MCH: 31.1 pg (ref 26.0–34.0)
MCHC: 33.7 g/dL (ref 30.0–36.0)
MCV: 92.1 fL (ref 78.0–100.0)
Platelets: 205 10*3/uL (ref 150–400)
RBC: 3.67 MIL/uL — ABNORMAL LOW (ref 3.87–5.11)
RDW: 14.2 % (ref 11.5–15.5)
WBC: 6 10*3/uL (ref 4.0–10.5)

## 2014-11-11 LAB — LIPID PANEL
CHOL/HDL RATIO: 2.3 ratio
Cholesterol: 167 mg/dL (ref 0–200)
HDL: 72 mg/dL (ref >39)
LDL Cholesterol: 87 mg/dL (ref 0–99)
Triglycerides: 42 mg/dL (ref <150)
VLDL: 8 mg/dL (ref 0–40)

## 2014-11-11 LAB — GLUCOSE, CAPILLARY
GLUCOSE-CAPILLARY: 74 mg/dL (ref 70–99)
Glucose-Capillary: 91 mg/dL (ref 70–99)
Glucose-Capillary: 92 mg/dL (ref 70–99)
Glucose-Capillary: 88 mg/dL (ref 70–99)

## 2014-11-11 LAB — SEDIMENTATION RATE: Sed Rate: 8 mm/hr (ref 0–22)

## 2014-11-11 LAB — TSH: TSH: 4.256 u[IU]/mL (ref 0.350–4.500)

## 2014-11-11 LAB — FOLATE: FOLATE: 9.9 ng/mL

## 2014-11-11 LAB — VITAMIN B12: Vitamin B-12: 514 pg/mL (ref 211–911)

## 2014-11-11 LAB — ANTITHROMBIN III: AntiThromb III Func: 99 % (ref 75–120)

## 2014-11-11 MED ORDER — CHLORHEXIDINE GLUCONATE 0.12 % MT SOLN
15.0000 mL | Freq: Two times a day (BID) | OROMUCOSAL | Status: DC
  Administered 2014-11-11 – 2014-11-13 (×4): 15 mL via OROMUCOSAL
  Filled 2014-11-11 (×4): qty 15

## 2014-11-11 MED ORDER — ASPIRIN 300 MG RE SUPP
300.0000 mg | Freq: Every day | RECTAL | Status: DC
  Administered 2014-11-11: 300 mg via RECTAL
  Filled 2014-11-11 (×3): qty 1

## 2014-11-11 MED ORDER — STROKE: EARLY STAGES OF RECOVERY BOOK
Freq: Once | Status: AC
  Administered 2014-11-11: 10:00:00
  Filled 2014-11-11: qty 1

## 2014-11-11 MED ORDER — PHENOBARBITAL 32.4 MG PO TABS: 64.8000 mg | ORAL_TABLET | Freq: Every day | ORAL | Status: DC

## 2014-11-11 MED ORDER — LEVOTHYROXINE SODIUM 25 MCG PO TABS
25.0000 ug | ORAL_TABLET | Freq: Every day | ORAL | Status: DC
  Administered 2014-11-12 – 2014-11-13 (×2): 25 ug via ORAL
  Filled 2014-11-11 (×2): qty 1

## 2014-11-11 MED ORDER — ATORVASTATIN CALCIUM 40 MG PO TABS
40.0000 mg | ORAL_TABLET | Freq: Every evening | ORAL | Status: DC
  Administered 2014-11-12: 40 mg via ORAL
  Filled 2014-11-11: qty 1

## 2014-11-11 MED ORDER — PHENOBARBITAL 97.2 MG PO TABS
97.2000 mg | ORAL_TABLET | Freq: Every day | ORAL | Status: DC
  Administered 2014-11-12: 97.2 mg via ORAL
  Filled 2014-11-11: qty 1

## 2014-11-11 MED ORDER — DEXAMETHASONE SODIUM PHOSPHATE 10 MG/ML IJ SOLN
10.0000 mg | Freq: Once | INTRAMUSCULAR | Status: AC
  Administered 2014-11-12: 10 mg via INTRAVENOUS
  Filled 2014-11-11 (×2): qty 1

## 2014-11-11 MED ORDER — SODIUM CHLORIDE 0.9 % IV SOLN
INTRAVENOUS | Status: DC
  Administered 2014-11-11 – 2014-11-12 (×2): via INTRAVENOUS

## 2014-11-11 MED ORDER — PAROXETINE HCL 20 MG PO TABS
10.0000 mg | ORAL_TABLET | Freq: Every day | ORAL | Status: DC
  Administered 2014-11-12 – 2014-11-13 (×2): 10 mg via ORAL
  Filled 2014-11-11 (×2): qty 1

## 2014-11-11 MED ORDER — HEPARIN SODIUM (PORCINE) 5000 UNIT/ML IJ SOLN
5000.0000 [IU] | Freq: Three times a day (TID) | INTRAMUSCULAR | Status: DC
  Administered 2014-11-11 – 2014-11-13 (×8): 5000 [IU] via SUBCUTANEOUS
  Filled 2014-11-11 (×8): qty 1

## 2014-11-11 MED ORDER — CETYLPYRIDINIUM CHLORIDE 0.05 % MT LIQD
7.0000 mL | Freq: Two times a day (BID) | OROMUCOSAL | Status: DC
  Administered 2014-11-12 – 2014-11-13 (×3): 7 mL via OROMUCOSAL

## 2014-11-11 MED ORDER — ASPIRIN 325 MG PO TABS: 325.0000 mg | ORAL_TABLET | Freq: Every day | ORAL | Status: DC

## 2014-11-11 NOTE — Progress Notes (Signed)
UR completed 

## 2014-11-11 NOTE — Progress Notes (Signed)
  Echocardiogram 2D Echocardiogram has been performed.  [name] 11/11/2014, 1:56 PM

## 2014-11-11 NOTE — Progress Notes (Signed)
Patient with a prior hemorrhagic CVA question of seizure activity and's again as previous with a fascia is in onset and previous ridging studies reveal possible CVA left hemisphere upper coagulable workup in progress dementia workup in progress patient was not a candidate for lytic therapy due to prior hemorrhagic CVA [name]:[id number] DOB: Apr 30, 1946 DOA: 11/10/2014 PCP: Maricela Curet, MD             Physical Exam: Blood pressure 109/75, pulse 69, temperature 98.1 F (36.7 C), temperature source Oral, resp. rate 16, height 5' 4.5" (1.638 m), weight 103 lb 2.8 oz (46.8 kg), SpO2 100 %. neck no JVD no carotid bruit no thyromegaly lungs clear to A&P no rales wheeze rhonchi diminished breath sounds both bases heart regular rhythm no murmurs, heaves rubs neurologic symmetric facial present she responds to simple commands no lateralizing signs present plantars are equivocal bilaterally   Investigations:  No results found for this or any previous visit (from the past 240 hour(s)).   Basic Metabolic Panel:  Recent Labs  11/10/14 2104 11/10/14 2113  NA 136 139  K 3.4* 3.8  CL 103 103  CO2 25  --   GLUCOSE 129* 128*  BUN 14 14  CREATININE 0.80 0.70  CALCIUM 9.1  --    Liver Function Tests:  Recent Labs  11/10/14 2104  AST 18  ALT 12  ALKPHOS 45  BILITOT 0.4  PROT 7.5  ALBUMIN 4.3     CBC:  Recent Labs  11/10/14 2104 11/10/14 2113  WBC 8.5  --   NEUTROABS 4.5  --   HGB 12.7 13.6  HCT 37.2 40.0  MCV 91.0  --   PLT 236  --     Ct Head Wo Contrast  11/10/2014   CLINICAL DATA:  Sudden onset left-sided weakness with expressive aphasia ; left-sided mouth drooping  EXAM: CT HEAD WITHOUT CONTRAST  TECHNIQUE: Contiguous axial images were obtained from the base of the skull through the vertex without intravenous contrast.  COMPARISON:  September 24, 2006  FINDINGS: Enlargement of the atria of the lateral ventricles, particularly on the right, remain stable.  This enlargement is felt to be due to encephalomalacia. There is no change in ventricular size and configuration compared to prior study. There is no intracranial mass, hemorrhage, extra-axial fluid collection, or midline shift. There is evidence of a prior infarct in each parietal lobe, larger on the right than on the left, stable. There is small vessel disease throughout the centra semiovale bilaterally. There is decreased attenuation in the region of the left mid insular cortex concerning for potential acute infarct. There is also decreased attenuation in the left lentiform nucleus which may be indicative of recent/acute infarct. Each middle cerebral artery has normal attenuation. The bony calvarium appears intact. The mastoid air cells are clear.  IMPRESSION: Question recent/acute infarct in the left insular cortex and lentiform nucleus. There is small vessel disease throughout the centra semiovale bilaterally. Prior infarcts in both parietal lobes, larger on the right than on the left, stable. No hemorrhage or mass effect. Areas of encephalomalacia with enlargement of the atria of the lateral ventricles, particularly on the right, stable.  Critical Value/emergent results were called by telephone at the time of interpretation on 11/10/2014 at 9:09 pm to Dr. Thayer Jew , who verbally acknowledged these results.   Electronically Signed   By: Lowella Grip III M.D.   On: 11/10/2014 21:09      Medications:   Impression:  Principal  Problem:   CVA (cerebral infarction) Active Problems:   HTN (hypertension)   Tobacco use     Plan: Hypercoagulable workup in progress. Dementia workup in progress carotid ultrasounds and echo ordered neurology consult requested   Consultants: Neurology requested    Procedures   Antibiotics:                   Code Status full  Family Communication:    Disposition Plan see plan above  Time spent: 30 minutes     [name]   11/11/2014,  6:57 AM

## 2014-11-11 NOTE — Evaluation (Signed)
Physical Therapy Evaluation Patient Details Name: [name] MRN: [id number] DOB: 01-31-1946 Today's Date: 11/11/2014   History of Present Illness  Pt is a 69 y.o. female with hx of seizure, prior aphasia, hx of hemorrhagic CVA, hx of HTN, active tobacco abuse, admitted about 7 months ago for similar symptoms of aphasia with negative stroke work up, presented to the ER with sudden onset of aphasia, ictus at approximately 20:00 hour, about 1 hour PTA in the ER. Code stroke was initiated, but she was not a candidate for TPA due to prior ICH. Her head CT showed question of acute CVA in the left insular cortex and lentiform nucleus, prior CVAs in both parietal lobes, right greater than left, and encephalomalacia. Her EKG showed NSR, and her serology was unremarkable. After phone consultation with Dr Paulita Cradle of neurology, hospitalist was asked to admit her for further stroke work up. Neurology will see her in consultation tomorrow.   Clinical Impression   Pt is seen for evaluation.  She is very alert, pleasant and cooperative, able to follow all directions.  She continues to have aphasia but speech is intelligible.  She has full dentures which are fitting poorly and she does not use any fixatives with them which may impair her speech to some degree.  Functionally, she is at baseline.  Strength is WNL and equal left to right.  Her RLE coordination is mildly impaired but balance and gait with no assistive device are WNL.  No further PT is needed.    Follow Up Recommendations No PT follow up    Equipment Recommendations  None recommended by PT    Recommendations for Other Services       Precautions / Restrictions Precautions Precautions: None Restrictions Weight Bearing Restrictions: No      Mobility  Bed Mobility Overal bed mobility: Independent Bed Mobility: Supine to Sit;Sit to Supine     Supine to sit: Independent Sit to supine: Independent      Transfers Overall transfer  level: Modified independent Equipment used: None                Ambulation/Gait Ambulation/Gait assistance: Modified independent (Device/Increase time) Ambulation Distance (Feet): 150 Feet Assistive device: None Gait Pattern/deviations: WFL(Within Functional Limits)   Gait velocity interpretation: at or above normal speed for age/gender    Stairs            Wheelchair Mobility    Modified Rankin (Stroke Patients Only)       Balance Overall balance assessment: Modified Independent                                           Pertinent Vitals/Pain Pain Assessment: No/denies pain    Home Living Family/patient expects to be discharged to:: Private residence Living Arrangements: Spouse/significant other;Other relatives Available Help at Discharge: Family;Available 24 hours/day Type of Home: House Home Access: Stairs to enter Entrance Stairs-Rails: Right Entrance Stairs-Number of Steps: 2 Home Layout: One level Home Equipment: Grab bars - toilet;Grab bars - tub/shower      Prior Function Level of Independence: Needs assistance   Gait / Transfers Assistance Needed: ambulates and transfers independently  ADL's / Homemaking Assistance Needed: Pt family assists with B/IADLs as needed  Comments: Pt does not engage in IADLs around the home. Family assist with all needs at baseline.     Hand Dominance   Dominant Hand:  Right    Extremity/Trunk Assessment   Upper Extremity Assessment: Defer to OT evaluation       LUE Deficits / Details: Deficits from previous CVA: 75% range of motion, shoulder strength 4-/5, PIP.DIP contractures of left digits   Lower Extremity Assessment: Overall WFL for tasks assessed;Defer to PT evaluation (mild decrease in RLE coordination)         Communication   Communication: Expressive difficulties  Cognition Arousal/Alertness: Awake/alert Behavior During Therapy: WFL for tasks assessed/performed Overall  Cognitive Status: Within Functional Limits for tasks assessed                      General Comments      Exercises        Assessment/Plan    PT Assessment Patent does not need any further PT services  PT Diagnosis     PT Problem List    PT Treatment Interventions     PT Goals (Current goals can be found in the Care Plan section) Acute Rehab PT Goals PT Goal Formulation: All assessment and education complete, DC therapy    Frequency     Barriers to discharge        Co-evaluation               End of Session Equipment Utilized During Treatment: Gait belt Activity Tolerance: Patient tolerated treatment well Patient left: in bed;with call bell/phone within reach;with bed alarm set;with family/visitor present Nurse Communication: Mobility status    Functional Assessment Tool Used: clinical judgement Functional Limitation: Mobility: Walking and moving around Mobility: Walking and Moving Around Current Status (R4854): 0 percent impaired, limited or restricted Mobility: Walking and Moving Around Goal Status [phone]): 0 percent impaired, limited or restricted Mobility: Walking and Moving Around Discharge Status [phone]): 0 percent impaired, limited or restricted    Time: 8182-9937 PT Time Calculation (min) (ACUTE ONLY): 33 min   Charges:   PT Evaluation $Initial PT Evaluation Tier I: 1 Procedure     PT G Codes:   PT G-Codes **NOT FOR INPATIENT CLASS** Functional Assessment Tool Used: clinical judgement Functional Limitation: Mobility: Walking and moving around Mobility: Walking and Moving Around Current Status (J6967): 0 percent impaired, limited or restricted Mobility: Walking and Moving Around Goal Status (E9381): 0 percent impaired, limited or restricted Mobility: Walking and Moving Around Discharge Status (O1751): 0 percent impaired, limited or restricted    Demetrios Isaacs L 11/11/2014, 9:30 AM

## 2014-11-11 NOTE — H&P (Addendum)
[facility] History and Physical  [name] NAT:[id number] DOB: [birth year]    PCP:   Maricela Curet, MD   Chief Complaint: acute onset of aphasia.   HPI: [name] is an 69 y.o. female with hx of seizure, prior aphasia,  hx of hemorrhagic CVA, hx of HTN, active tobacco abuse, admitted about 7 months ago for similar symptoms of aphasia with negative stroke work up, presented to the ER with sudden onset of aphasia, ictus at approximately 20:00 hour, about 1 hour PTA in the ER.  Code stroke was initiated, but she was not a candidate for TPA due to prior ICH.  Her head CT showed question of acute CVA in the left insular cortex and lentiform nucleus, prior CVAs in both parietal lobes, right greater than left, and encephalomalacia.  Her EKG showed NSR, and her serology was unremarkable.  After phone consultation with Dr Paulita Cradle of neurology, hospitalist was asked to admit her for further stroke work up.  Neurology will see her in consultation tomorrow.   Rewiew of Systems:  Constitutional: Negative for malaise, fever and chills. No significant weight loss or weight gain Eyes: Negative for eye pain, redness and discharge, diplopia, visual changes, or flashes of light. ENMT: Negative for ear pain, hoarseness, nasal congestion, sinus pressure and sore throat. No headaches; tinnitus, drooling, or problem swallowing. Cardiovascular: Negative for chest pain, palpitations, diaphoresis, dyspnea and peripheral edema. ; No orthopnea, PND Respiratory: Negative for cough, hemoptysis, wheezing and stridor. No pleuritic chestpain. Gastrointestinal: Negative for nausea, vomiting, diarrhea, constipation, abdominal pain, melena, blood in stool, hematemesis, jaundice and rectal bleeding.    Genitourinary: Negative for frequency, dysuria, incontinence,flank pain and hematuria; Musculoskeletal: Negative for back pain and neck pain. Negative for swelling and trauma.;  Skin: . Negative for pruritus,  rash, abrasions, bruising and skin lesion.; ulcerations Neuro: Negative for headache, lightheadedness and neck stiffness. Negative for weakness, altered level of consciousness , altered mental status, extremity weakness, burning feet, involuntary movement, seizure and syncope.  Psych: negative for anxiety, depression, insomnia, tearfulness, panic attacks, hallucinations, paranoia, suicidal or homicidal ideation    Past Medical History  Diagnosis Date  . Seizures   . Hypertension   . Stroke 2009, 2015 9/27    Past Surgical History  Procedure Laterality Date  . Peg tube placement    . Peg tube removal      Medications:  HOME MEDS: Prior to Admission medications   Medication Sig Start Date End Date Taking? Authorizing Provider  amLODipine-benazepril (LOTREL) 5-10 MG per capsule Take 1 capsule by mouth daily.   Yes Historical Provider, MD  aspirin EC 81 MG tablet Take 81 mg by mouth every evening.   Yes Historical Provider, MD  atorvastatin (LIPITOR) 40 MG tablet Take 40 mg by mouth every evening.    Yes Historical Provider, MD  labetalol (NORMODYNE) 100 MG tablet Take 100 mg by mouth 2 (two) times daily.   Yes Historical Provider, MD  levothyroxine (SYNTHROID, LEVOTHROID) 25 MCG tablet Take 25 mcg by mouth daily before breakfast.   Yes Historical Provider, MD  PHENobarbital (LUMINAL) 64.8 MG tablet Take 64.8 mg by mouth at bedtime.   Yes Historical Provider, MD  FLUoxetine (PROZAC) 10 MG capsule Take 10 mg by mouth daily.    Historical Provider, MD  PARoxetine (PAXIL) 10 MG tablet  10/26/14   Historical Provider, MD     Allergies:  Allergies  Allergen Reactions  . Codeine     Social History:   reports  that she has been smoking Cigarettes.  She does not have any smokeless tobacco history on file. She reports that she does not drink alcohol or use illicit drugs.  Family History: No family history on file.   Physical Exam: Filed Vitals:   11/10/14 2300 11/10/14 2315 11/10/14  2330 11/10/14 2345  BP: 149/87 135/94 111/82 122/92  Pulse:  92 89 86  Temp:      TempSrc:      Resp: 24 23 18 19   Height:      Weight:      SpO2: 98% 99% 99% 99%   Blood pressure 122/92, pulse 86, temperature 98.5 F (36.9 C), temperature source Oral, resp. rate 19, height 5\' 5"  (1.651 m), weight 49.896 kg (110 lb), SpO2 99 %.  GEN:  Pleasant  patient lying in the stretcher in no acute distress; cooperative with exam. PSYCH:  alert and oriented x4; does not appear anxious or depressed; affect is appropriate. HEENT: Mucous membranes pink and anicteric; PERRLA; EOM intact; no cervical lymphadenopathy nor thyromegaly or carotid bruit; no JVD; There were no stridor. Neck is very supple. Breasts:: Not examined CHEST WALL: No tenderness CHEST: Normal respiration, clear to auscultation bilaterally.  HEART: Regular rate and rhythm.  There are no murmur, rub, or gallops.   BACK: No kyphosis or scoliosis; no CVA tenderness ABDOMEN: soft and non-tender; no masses, no organomegaly, normal abdominal bowel sounds; no pannus; no intertriginous candida. There is no rebound and no distention. Rectal Exam: Not done EXTREMITIES: No bone or joint deformity; age-appropriate arthropathy of the hands and knees; no edema; no ulcerations.  There is no calf tenderness. Genitalia: not examined PULSES: 2+ and symmetric SKIN: Normal hydration no rash or ulceration CNS: Cranial nerves 2-12 grossly intact no focal lateralizing neurologic deficit.  She has dysarthria. Her uvula elevated with phonation, facial symmetry and tongue midline. DTR are normal bilaterally, cerebella exam is intact, barbinski is negative and strengths are equaled bilaterally.  No sensory loss.   Labs on Admission:  Basic Metabolic Panel:  Recent Labs Lab 11/10/14 2104 11/10/14 2113  NA 136 139  K 3.4* 3.8  CL 103 103  CO2 25  --   GLUCOSE 129* 128*  BUN 14 14  CREATININE 0.80 0.70  CALCIUM 9.1  --    Liver Function  Tests:  Recent Labs Lab 11/10/14 2104  AST 18  ALT 12  ALKPHOS 45  BILITOT 0.4  PROT 7.5  ALBUMIN 4.3   No results for input(s): LIPASE, AMYLASE in the last 168 hours. No results for input(s): AMMONIA in the last 168 hours. CBC:  Recent Labs Lab 11/10/14 2104 11/10/14 2113  WBC 8.5  --   NEUTROABS 4.5  --   HGB 12.7 13.6  HCT 37.2 40.0  MCV 91.0  --   PLT 236  --    Cardiac Enzymes: No results for input(s): CKTOTAL, CKMB, CKMBINDEX, TROPONINI in the last 168 hours.  CBG:  Recent Labs Lab 11/10/14 2110  GLUCAP 114*     Radiological Exams on Admission: Ct Head Wo Contrast  11/10/2014   CLINICAL DATA:  Sudden onset left-sided weakness with expressive aphasia ; left-sided mouth drooping  EXAM: CT HEAD WITHOUT CONTRAST  TECHNIQUE: Contiguous axial images were obtained from the base of the skull through the vertex without intravenous contrast.  COMPARISON:  September 24, 2006  FINDINGS: Enlargement of the atria of the lateral ventricles, particularly on the right, remain stable. This enlargement is felt to be due to  encephalomalacia. There is no change in ventricular size and configuration compared to prior study. There is no intracranial mass, hemorrhage, extra-axial fluid collection, or midline shift. There is evidence of a prior infarct in each parietal lobe, larger on the right than on the left, stable. There is small vessel disease throughout the centra semiovale bilaterally. There is decreased attenuation in the region of the left mid insular cortex concerning for potential acute infarct. There is also decreased attenuation in the left lentiform nucleus which may be indicative of recent/acute infarct. Each middle cerebral artery has normal attenuation. The bony calvarium appears intact. The mastoid air cells are clear.  IMPRESSION: Question recent/acute infarct in the left insular cortex and lentiform nucleus. There is small vessel disease throughout the centra semiovale  bilaterally. Prior infarcts in both parietal lobes, larger on the right than on the left, stable. No hemorrhage or mass effect. Areas of encephalomalacia with enlargement of the atria of the lateral ventricles, particularly on the right, stable.  Critical Value/emergent results were called by telephone at the time of interpretation on 11/10/2014 at 9:09 pm to Dr. Thayer Jew , who verbally acknowledged these results.   Electronically Signed   By: Lowella Grip III M.D.   On: 11/10/2014 21:09    EKG: Independently reviewed. NSR with no acute ST T changes.    Assessment/Plan Present on Admission:  . CVA (cerebral infarction) . HTN (hypertension) . Tobacco use Hx of Seizure  PLAN:  Will admit her to complete CVA work up.  She will obtain MRI of the brain, along with ECHO and Carotid US.  Her previous study showed no definite stenosis, but she had plaques.  Will obtain fasting lipid, and get hypercoagulable work up as well.  She was recommended to continue with ASA as per neurology.  I recommended that she quit cigarettes.  There were no definite suspicion for seizure.  Will continue her med and check a phenobarbital level.  Since her BP is on the low normal side, I will hold her BP meds, as hypotension would be detrimental. She is stable, full code, and will be admitted to Dr Lucia Gaskins as per prior arrangement.  Thank you for allowing me to participate in the care of your nice patient, Dr Cindie Laroche.   Other plans as per orders.  Code Status: FULL Haskel Khan, MD. [facility] Pager [phone] 7pm to 7am.  11/11/2014, 12:08 AM

## 2014-11-11 NOTE — Care Management Note (Addendum)
    Page 1 of 2   11/13/2014     9:24:44 AM CARE MANAGEMENT NOTE 11/13/2014  Patient:  ANAJULIA, LEYENDECKER   Account Number:  [id number]  Date Initiated:  11/11/2014  Documentation initiated by:  Theophilus Kinds  Subjective/Objective Assessment:   Pt admitted from home with CVA. Pt lives with her husband and will return home at discharge. Pt has been independent with ADL's.     Action/Plan:   PT and OT did not recommend any HH followup. Awaiting speech eval. Will continue to follow for discharge planning needs.   Anticipated DC Date:  11/12/2014   Anticipated DC Plan:  [place]  CM consult      [facility] Choice  HOME HEALTH   Choice offered to / List presented to:  C-2 HC POA / Guardian        HH arranged  [place].   Status of service:  Completed, signed off Medicare Important Message given?   (If response is "NO", the following Medicare IM given date fields will be blank) Date Medicare IM given:   Medicare IM given by:   Date Additional Medicare IM given:   Additional Medicare IM given by:    Discharge Disposition:  [place]  Per UR Regulation:    If discussed at Long Length of Stay Meetings, dates discussed:    Comments:  11/13/14 0920 Christinia Gully, RN BSN CM Pt discharged home today with [facility] services with [facility] (per POA choice). Romualdo Bolk of [facility] is aware and will collect the pts information from the chart. [place] services to start within 48 hours of discharge. No DME needs noted. Pt will have event monitor will be applied to pt prior to discharge. Pt and pts nurse aware of discharge arrangements.  11/11/14 [place], RN BSN CM

## 2014-11-11 NOTE — Consult Note (Signed)
[place] A. Brandi Laughter, MD     [email or web address]          [name] is an 69 y.o. female.   ASSESSMENT/PLAN:  1. Recurrent episodes of dysphagia/speech impairment and confusion with negative imaging for ischemic events. Post-lesional phenomena seems most likely particularly epileptic events. 2. Baseline history of epilepsy apparently well-controlled although these recurrent events are suspicious for seizures. 3. Left frontal meningioma which is significant given the size and location potentially causing aphasia and seizures. 4. 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 There is also history of intracranial hemorrhage however.  RECOMMENDATION: Given her subtherapeutic phenobarbital levels, the dose of phenobarbital will be increased. We'll go ahead and recheck a level tonight. EEG. One-time dose of Decadron given the mild vasogenic edema associated with the meningioma. Out patient neurosurgical consultation for excision of the meningioma. Speech evaluation/ swallowing evaluation. 30 day event monitor for possible cardioembolic events causing the previous large vessel chronic infarcts.   The patient is 69 year old black female who presents with the acute onset of impaired speech and confusion. The patient had a similar event about 6 months ago had a very extensive workup at that time including MRI/MRA, carotid and echocardiography. The workup was unrevealing at that time. We did have suspicion that the events could've been an epileptic event and she was to get her phenobarbital increase but it appears this was not done. She also was passed in outpatient follow-up but this was not done. The patient at baseline seems to function fairly well with the baseline mild to moderate dysarthria per the niece. She is able to talk relatively fluently until last night when she developed the acute  dysarthria/aphasia. Her symptoms lasted for several hours but has improved. However, the niece reports that she's not baseline. She did have a bedside swallowing test last night but apparently failed and she has been kept nothing by mouth. She does not report worsening focal neurological symptoms. 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 No loss of consciousness reported. No chest pain, shortness of breath, GI GU symptoms. The review of systems is otherwise negative.  GENERAL: This a very pleasant thin female in no acute distress.  HEENT: Supple. Atraumatic normocephalic.   ABDOMEN: soft  EXTREMITIES: No edema   BACK: Normal.  SKIN: Normal by inspection.    MENTAL STATUS: Alert and oriented. Speech is moderately dysarthric. Comprehension and fluency are good.  CRANIAL NERVES: Pupils are equal, round and reactive to light and accommodation; extra ocular movements are full, there is no significant nystagmus; visual fields are full; upper and lower facial muscles are normal in strength and symmetric, there is no flattening of the nasolabial folds; tongue is midline; uvula is midline; shoulder elevation is normal.  MOTOR: She has a left upper extremity monoparesis. Deltoid graded as 4/5. Hand grip to 3/5 with significant increased tone/spasticity associated with contracture of the hand. The other extremities shows normal tone, bulk and strength.  COORDINATION: Left finger to nose is normal, right finger to nose is normal, No rest tremor; no intention tremor; no postural tremor; no bradykinesia.  REFLEXES: Deep tendon reflexes are symmetrical and normal, but slightly diminished in the legs. Babinski reflexes are flexor bilaterally.   SENSATION: Normal to light touch.   Brain MRI is reviewed in person. There is a moderate-sized is an extra-axial left frontal mass homogeneous with mild associated vasogenic edema. There  is a large  right parietal occipital encephalomalacia extending to the posterior horn of the lateral ventricle. There is also a smaller size encephalomalacia involving the left occipital lobe. There is a small encephalomalacia involving the lentiform nucleus on the left. Nothing acute is seen.        [[[[[[[[[[[[[[[[[MY PRIOR NOTE 04-08-14 1. Acute altered mental status Of unclear etiology. However, given the previous large vessel cortical infarcts and meningioma, seizure is suspected.  2. Moderate size left frontal extra-axial mass consistent with a meningioma. This is associated with the moderate vasogenic edema.  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  4. 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 ( Follow with repeat Brain CTA 12 months)    Increase phenobarbital. I think we should increase the phenobarbital tone rather than adding what the so-called new or safer agents. She has been well controlled on phenobarbital for many years and without side effects.  Likely trial of low-dose steroids. Decadron or prednisone should be sufficient.   Outpatient neurosurgical consultation for the meningioma.   Given the high likelihood of cardioembolic strokes, A 30 day event monitor/Holter monitor to evaluate for atrial fibrillation is suggested.   MRI BRAIN W Contrast. ]]]]]]]]]]]]]]]]]]]]]]]  Blood pressure 145/81, pulse 88, temperature 98.6 F (37 C), temperature source Oral, resp. rate 18, height 5' 4.5" (1.638 m), weight 46.8 kg (103 lb 2.8 oz), SpO2 100 %.  Past Medical History  Diagnosis Date  . Seizures   . Hypertension   . Stroke 2009, 2015 9/27  . Thyroid disease     Past Surgical History  Procedure Laterality Date  . Peg tube placement    . Peg tube removal      History reviewed. No pertinent family history.  Social History:  reports that she has been smoking Cigarettes.  She has a 75 pack-year smoking  history. She does not have any smokeless tobacco history on file. She reports that she does not drink alcohol or use illicit drugs.  Allergies:  Allergies  Allergen Reactions  . Codeine     Medications: Prior to Admission medications   Medication Sig Start Date End Date Taking? Authorizing Provider  amLODipine-benazepril (LOTREL) 5-10 MG per capsule Take 1 capsule by mouth daily.   Yes Historical Provider, MD  aspirin EC 81 MG tablet Take 81 mg by mouth every evening.   Yes Historical Provider, MD  atorvastatin (LIPITOR) 40 MG tablet Take 40 mg by mouth every evening.    Yes Historical Provider, MD  labetalol (NORMODYNE) 100 MG tablet Take 100 mg by mouth 2 (two) times daily.   Yes Historical Provider, MD  levothyroxine (SYNTHROID, LEVOTHROID) 25 MCG tablet Take 25 mcg by mouth daily before breakfast.   Yes Historical Provider, MD  PARoxetine (PAXIL) 10 MG tablet  10/26/14  Yes Historical Provider, MD  PHENobarbital (LUMINAL) 64.8 MG tablet Take 64.8 mg by mouth at bedtime.   Yes Historical Provider, MD    Scheduled Meds: . [START ON 11/12/2014] antiseptic oral rinse  7 mL Mouth Rinse q12n4p  . aspirin  300 mg Rectal Daily  . atorvastatin  40 mg Oral QPM  . chlorhexidine  15 mL Mouth Rinse BID  . heparin  5,000 Units Subcutaneous 3 times per day  . levothyroxine  25 mcg Oral QAC breakfast  . PARoxetine  10 mg Oral Daily  . PHENobarbital  64.8 mg Oral QHS   Continuous Infusions: . sodium chloride 100 mL/hr at  11/11/14 0132   PRN Meds:.     Results for orders placed or performed during the hospital encounter of 11/10/14 (from the past 48 hour(s))  Ethanol     Status: None   Collection Time: 11/10/14  9:04 PM  Result Value Ref Range   Alcohol, Ethyl (B) <5 0 - 9 mg/dL    Comment:        LOWEST DETECTABLE LIMIT FOR SERUM ALCOHOL IS 11 mg/dL FOR MEDICAL PURPOSES ONLY   Protime-INR     Status: None   Collection Time: 11/10/14  9:04 PM  Result Value Ref Range   Prothrombin  Time 13.7 11.6 - 15.2 seconds   INR 1.04 0.00 - 1.49  APTT     Status: None   Collection Time: 11/10/14  9:04 PM  Result Value Ref Range   aPTT 26 24 - 37 seconds  CBC     Status: None   Collection Time: 11/10/14  9:04 PM  Result Value Ref Range   WBC 8.5 4.0 - 10.5 K/uL   RBC 4.09 3.87 - 5.11 MIL/uL   Hemoglobin 12.7 12.0 - 15.0 g/dL   HCT 37.2 36.0 - 46.0 %   MCV 91.0 78.0 - 100.0 fL   MCH 31.1 26.0 - 34.0 pg   MCHC 34.1 30.0 - 36.0 g/dL   RDW 14.0 11.5 - 15.5 %   Platelets 236 150 - 400 K/uL  Differential     Status: None   Collection Time: 11/10/14  9:04 PM  Result Value Ref Range   Neutrophils Relative % 53 43 - 77 %   Neutro Abs 4.5 1.7 - 7.7 K/uL   Lymphocytes Relative 36 12 - 46 %   Lymphs Abs 3.1 0.7 - 4.0 K/uL   Monocytes Relative 10 3 - 12 %   Monocytes Absolute 0.8 0.1 - 1.0 K/uL   Eosinophils Relative 1 0 - 5 %   Eosinophils Absolute 0.1 0.0 - 0.7 K/uL   Basophils Relative 0 0 - 1 %   Basophils Absolute 0.0 0.0 - 0.1 K/uL  Comprehensive metabolic panel     Status: Abnormal   Collection Time: 11/10/14  9:04 PM  Result Value Ref Range   Sodium 136 135 - 145 mmol/L   Potassium 3.4 (L) 3.5 - 5.1 mmol/L   Chloride 103 96 - 112 mmol/L   CO2 25 19 - 32 mmol/L   Glucose, Bld 129 (H) 70 - 99 mg/dL   BUN 14 6 - 23 mg/dL   Creatinine, Ser 0.80 0.50 - 1.10 mg/dL   Calcium 9.1 8.4 - 10.5 mg/dL   Total Protein 7.5 6.0 - 8.3 g/dL   Albumin 4.3 3.5 - 5.2 g/dL   AST 18 0 - 37 U/L   ALT 12 0 - 35 U/L   Alkaline Phosphatase 45 39 - 117 U/L   Total Bilirubin 0.4 0.3 - 1.2 mg/dL   GFR calc non Af Amer 75 (L) >90 mL/min   GFR calc Af Amer 87 (L) >90 mL/min    Comment: (NOTE) The eGFR has been calculated using the CKD EPI equation. This calculation has not been validated in all clinical situations. eGFR's persistently <90 mL/min signify possible Chronic Kidney Disease.    Anion gap 8 5 - 15  I-Stat Troponin, ED (not at [facility])     Status: None   Collection Time: 11/10/14   9:10 PM  Result Value Ref Range   Troponin i, poc 0.00 0.00 - 0.08 ng/mL  Comment 3            Comment: Due to the release kinetics of cTnI, a negative result within the first hours of the onset of symptoms does not rule out myocardial infarction with certainty. If myocardial infarction is still suspected, repeat the test at appropriate intervals.   CBG monitoring, ED     Status: Abnormal   Collection Time: 11/10/14  9:10 PM  Result Value Ref Range   Glucose-Capillary 114 (H) 70 - 99 mg/dL  I-Stat Chem 8, ED     Status: Abnormal   Collection Time: 11/10/14  9:13 PM  Result Value Ref Range   Sodium 139 135 - 145 mmol/L   Potassium 3.8 3.5 - 5.1 mmol/L   Chloride 103 96 - 112 mmol/L   BUN 14 6 - 23 mg/dL   Creatinine, Ser 0.70 0.50 - 1.10 mg/dL   Glucose, Bld 128 (H) 70 - 99 mg/dL   Calcium, Ion 1.14 1.13 - 1.30 mmol/L   TCO2 22 0 - 100 mmol/L   Hemoglobin 13.6 12.0 - 15.0 g/dL   HCT 40.0 36.0 - 46.0 %  Urine Drug Screen     Status: Abnormal   Collection Time: 11/10/14 10:12 PM  Result Value Ref Range   Opiates NONE DETECTED NONE DETECTED   Cocaine NONE DETECTED NONE DETECTED   Benzodiazepines NONE DETECTED NONE DETECTED   Amphetamines NONE DETECTED NONE DETECTED   Tetrahydrocannabinol NONE DETECTED NONE DETECTED   Barbiturates POSITIVE (A) NONE DETECTED    Comment:        DRUG SCREEN FOR MEDICAL PURPOSES ONLY.  IF CONFIRMATION IS NEEDED FOR ANY PURPOSE, NOTIFY LAB WITHIN 5 DAYS.        LOWEST DETECTABLE LIMITS FOR URINE DRUG SCREEN Drug Class       Cutoff (ng/mL) Amphetamine      1000 Barbiturate      200 Benzodiazepine   387 Tricyclics       564 Opiates          300 Cocaine          300 THC              50   Urinalysis, Routine w reflex microscopic     Status: Abnormal   Collection Time: 11/10/14 10:12 PM  Result Value Ref Range   Color, Urine YELLOW YELLOW   APPearance CLEAR CLEAR   Specific Gravity, Urine 1.015 1.005 - 1.030   pH 5.5 5.0 - 8.0    Glucose, UA NEGATIVE NEGATIVE mg/dL   Hgb urine dipstick SMALL (A) NEGATIVE   Bilirubin Urine NEGATIVE NEGATIVE   Ketones, ur NEGATIVE NEGATIVE mg/dL   Protein, ur NEGATIVE NEGATIVE mg/dL   Urobilinogen, UA 0.2 0.0 - 1.0 mg/dL   Nitrite NEGATIVE NEGATIVE   Leukocytes, UA NEGATIVE NEGATIVE  Urine microscopic-add on     Status: None   Collection Time: 11/10/14 10:12 PM  Result Value Ref Range   Squamous Epithelial / LPF RARE RARE   WBC, UA 0-2 <3 WBC/hpf   RBC / HPF 0-2 <3 RBC/hpf   Bacteria, UA RARE RARE  Fasting lipid panel     Status: None   Collection Time: 11/11/14  6:30 AM  Result Value Ref Range   Cholesterol 167 0 - 200 mg/dL   Triglycerides 42 <150 mg/dL   HDL 72 >39 mg/dL   Total CHOL/HDL Ratio 2.3 RATIO   VLDL 8 0 - 40 mg/dL   LDL Cholesterol 87 0 - 99  mg/dL    Comment:        Total Cholesterol/HDL:CHD Risk Coronary Heart Disease Risk Table                     Men   Women  1/2 Average Risk   3.4   3.3  Average Risk       5.0   4.4  2 X Average Risk   9.6   7.1  3 X Average Risk  23.4   11.0        Use the calculated Patient Ratio above and the CHD Risk Table to determine the patient's CHD Risk.        ATP III CLASSIFICATION (LDL):  <100     mg/dL   Optimal  100-129  mg/dL   Near or Above                    Optimal  130-159  mg/dL   Borderline  160-189  mg/dL   High  >190     mg/dL   Very High   Antithrombin III     Status: None   Collection Time: 11/11/14  6:31 AM  Result Value Ref Range   AntiThromb III Func 99 75 - 120 %    Comment: Performed at [facility]  Phenobarbital level     Status: Abnormal   Collection Time: 11/11/14  6:31 AM  Result Value Ref Range   Phenobarbital 8.8 (L) 15.0 - 40.0 ug/mL  Vitamin B12     Status: None   Collection Time: 11/11/14  6:31 AM  Result Value Ref Range   Vitamin B-12 514 211 - 911 pg/mL    Comment: Performed at [facility]  Sedimentation rate     Status: None   Collection Time: 11/11/14  6:31  AM  Result Value Ref Range   Sed Rate 8 0 - 22 mm/hr  CBC     Status: Abnormal   Collection Time: 11/11/14  6:31 AM  Result Value Ref Range   WBC 6.0 4.0 - 10.5 K/uL   RBC 3.67 (L) 3.87 - 5.11 MIL/uL   Hemoglobin 11.4 (L) 12.0 - 15.0 g/dL   HCT 33.8 (L) 36.0 - 46.0 %   MCV 92.1 78.0 - 100.0 fL   MCH 31.1 26.0 - 34.0 pg   MCHC 33.7 30.0 - 36.0 g/dL   RDW 14.2 11.5 - 15.5 %   Platelets 205 150 - 400 K/uL  TSH     Status: None   Collection Time: 11/11/14  6:31 AM  Result Value Ref Range   TSH 4.256 0.350 - 4.500 uIU/mL  Folate     Status: None   Collection Time: 11/11/14  6:31 AM  Result Value Ref Range   Folate 9.9 ng/mL    Comment: (NOTE) Reference Ranges        Deficient:       0.4 - 3.3 ng/mL        Indeterminate:   3.4 - 5.4 ng/mL        Normal:              > 5.4 ng/mL Performed at [facility]   Glucose, capillary     Status: None   Collection Time: 11/11/14  7:39 AM  Result Value Ref Range   Glucose-Capillary 91 70 - 99 mg/dL  Urine rapid drug screen (hosp performed)     Status: Abnormal   Collection Time: 11/11/14  7:59 AM  Result Value Ref Range   Opiates NONE DETECTED NONE DETECTED   Cocaine NONE DETECTED NONE DETECTED   Benzodiazepines NONE DETECTED NONE DETECTED   Amphetamines NONE DETECTED NONE DETECTED   Tetrahydrocannabinol NONE DETECTED NONE DETECTED   Barbiturates POSITIVE (A) NONE DETECTED    Comment:        DRUG SCREEN FOR MEDICAL PURPOSES ONLY.  IF CONFIRMATION IS NEEDED FOR ANY PURPOSE, NOTIFY LAB WITHIN 5 DAYS.        LOWEST DETECTABLE LIMITS FOR URINE DRUG SCREEN Drug Class       Cutoff (ng/mL) Amphetamine      1000 Barbiturate      200 Benzodiazepine   801 Tricyclics       655 Opiates          300 Cocaine          300 THC              50   Glucose, capillary     Status: None   Collection Time: 11/11/14 12:48 PM  Result Value Ref Range   Glucose-Capillary 92 70 - 99 mg/dL  Glucose, capillary     Status: None   Collection  Time: 11/11/14  4:35 PM  Result Value Ref Range   Glucose-Capillary 88 70 - 99 mg/dL    Studies/Results:  CAROTID DOPPLERS Moderate to large amount of bilateral atherosclerotic plaque, right greater than left, not resulting in a hemodynamically significant stenosis.  BRAIN MRI/MRA MRI HEAD FINDINGS  There is a 3 cm diameter extra-axial mass at the left frontoparietal convexity typical of meningioma. This indents the brain and is associated with mild regional vasogenic edema.  Diffusion imaging does not show any acute or subacute infarction.  There chronic small-vessel ischemic changes affecting the pons. No cerebellar insult. Elsewhere in the cerebral hemispheres, there are old parieto-occipital infarctions, more extensive on the right than the left, with atrophy and encephalomalacia. There is old ischemic change more diffusely in the right temporal lobe. Residual hemosiderin is seen in the regions of those infarctions. There chronic small-vessel ischemic changes of the deep white matter. No hydrocephalus. No intra-axial mass lesion. No pituitary mass. No inflammatory sinus disease. No skull base lesion.  MRA HEAD FINDINGS  Both internal carotid arteries are widely patent into the brain. No siphon stenosis. The anterior and middle cerebral vessels are patent without proximal stenosis, aneurysm or vascular malformation.  Both vertebral arteries are widely patent to the basilar. No basilar stenosis. Posterior circulation branch vessels are patent proximally. There is distal vessel narrowing in the PCA branches.  IMPRESSION: 3 cm meningioma at the left frontoparietal convexity with mass effect upon the brain and mild regional vasogenic edema.  Old PCA infarctions bilaterally. Chronic small-vessel ischemic changes throughout the brain.  No large or medium vessel MR angiographic abnormality.  EEG 04-08-14 This recording is abnormal showing focal left  frontotemporal slowing indicating a focal cerebral disturbance. Additionally, there is overall less well formed activity over the left hemisphere.  Jefferson Fullam A. [name], M.D.  Diplomate, Tax adviser of Psychiatry and Neurology ( Neurology). 11/11/2014, 8:19 PM

## 2014-11-11 NOTE — Evaluation (Signed)
Occupational Therapy Evaluation Patient Details Name: RAMONITA KOENIG MRN: [id number] DOB: May 03, 1946 Today's Date: 11/11/2014    History of Present Illness Pt is a 69 y.o. female with hx of seizure, prior aphasia, hx of hemorrhagic CVA, hx of HTN, active tobacco abuse, admitted about 7 months ago for similar symptoms of aphasia with negative stroke work up, presented to the ER with sudden onset of aphasia, ictus at approximately 20:00 hour, about 1 hour PTA in the ER. Code stroke was initiated, but she was not a candidate for TPA due to prior ICH. Her head CT showed question of acute CVA in the left insular cortex and lentiform nucleus, prior CVAs in both parietal lobes, right greater than left, and encephalomalacia. Her EKG showed NSR, and her serology was unremarkable. After phone consultation with Dr Paulita Cradle of neurology, hospitalist was asked to admit her for further stroke work up. Neurology will see her in consultation tomorrow.    Clinical Impression   PTA pt lived at home with husband and family assisted with B/IADLs as needed. Pt awake, alert, and oriented x4 this am, niece present during evaluation. Pt continues to demonstrate slurred speech, able to understand and follow instructions well. Pt demonstrates independence in bed mobility, supine<>sit/sit<>supine. Pt able to doff & donn socks with supervision for safety while sitting at EOB. Pt niece reports that patient receives assistance as need from family for dressing, bathing, & grooming tasks. Husband prepares meals & sister assists with medication management. Pt reports she does not use any equipment for functional mobility purposes although has difficulty staying in a straight line while walking due to left sided deficits from prior stroke. Pt uses grab bars at the toilet and shower for safety. Pt demonstrates good range of motion, 75%, and strength (4/5) in RUE, LUE deficits from previous stroke present, ROM 70%, strength 4-/5. Pt is  at baseline with B/IADLs and has good family support at home, no further OT services required.     Follow Up Recommendations  No OT follow up;Supervision/Assistance - 24 hour    Equipment Recommendations  None recommended by OT       Precautions / Restrictions Precautions Precautions: Fall      Mobility Bed Mobility Overal bed mobility: Independent Bed Mobility: Supine to Sit;Sit to Supine     Supine to sit: Independent Sit to supine: Independent            ADL Overall ADL's : Needs assistance/impaired;At baseline Eating/Feeding: Set up (per pt report)   Grooming: Minimal assistance (per pt/family report)               Lower Body Dressing: Supervision/safety                 General ADL Comments: Pt family reports pt receives assistance with dressing, bathing, grooming as needed. Husband and sister assist with meal prepartion and medication management     Vision Vision Assessment?: Yes Eye Alignment: Within Functional Limits Ocular Range of Motion: Within Functional Limits Alignment/Gaze Preference: Within Defined Limits Tracking/Visual Pursuits: Able to track stimulus in all quads without difficulty Saccades: Within functional limits Convergence: Within functional limits Visual Fields: Left visual field deficit (left peripheral vision-approximately 60 degrees)          Pertinent Vitals/Pain Pain Assessment: No/denies pain     Hand Dominance Right   Extremity/Trunk Assessment Upper Extremity Assessment Upper Extremity Assessment: LUE deficits/detail LUE Deficits / Details: Deficits from previous CVA: 75% range of motion, shoulder strength 4-/5, PIP.DIP  contractures of left digits LUE Coordination: decreased fine motor   Lower Extremity Assessment Lower Extremity Assessment: Defer to PT evaluation       Communication Communication Communication: Expressive difficulties   Cognition Arousal/Alertness: Awake/alert Behavior During Therapy:  WFL for tasks assessed/performed Overall Cognitive Status: Within Functional Limits for tasks assessed                                Home Living Family/patient expects to be discharged to:: Private residence Living Arrangements: Spouse/significant other;Other relatives (Niece) Available Help at Discharge: Family;Available 24 hours/day Type of Home: House             Bathroom Shower/Tub: Teacher, early years/pre: Standard     Home Equipment: Grab bars - toilet;Grab bars - tub/shower          Prior Functioning/Environment Level of Independence: Needs assistance    ADL's / Homemaking Assistance Needed: Pt family assists with B/IADLs as needed        OT Diagnosis: Generalized weakness   OT Problem List: Impaired UE functional use    End of Session    Activity Tolerance: Patient tolerated treatment well Patient left: in bed;with call bell/phone within reach;with bed alarm set;with family/visitor present   Time: 0820-0845 OT Time Calculation (min): 25 min Charges:  OT General Charges $OT Visit: 1 Procedure OT Evaluation $Initial OT Evaluation Tier I: 1 Procedure G-Codes: OT G-codes **NOT FOR INPATIENT CLASS** Functional Assessment Tool Used: Clinical judgement Functional Limitation: Self care Self Care Current Status (W8889): At least 40 percent but less than 60 percent impaired, limited or restricted Self Care Goal Status (V6945): At least 40 percent but less than 60 percent impaired, limited or restricted Self Care Discharge Status [phone]): At least 40 percent but less than 60 percent impaired, limited or restricted   Guadelupe Sabin, OTR/L  [phone] 11/11/2014, 9:03 AM

## 2014-11-12 ENCOUNTER — Observation Stay (HOSPITAL_COMMUNITY)
Admit: 2014-11-12 | Discharge: 2014-11-12 | Disposition: A | Discharge status: Home / Self Care | Payer: Medicare HMO | Attending: Neurology | Admitting: Neurology

## 2014-11-12 ENCOUNTER — Encounter (HOSPITAL_COMMUNITY): Payer: Self-pay | Admitting: Adult Health

## 2014-11-12 DIAGNOSIS — I638 Other cerebral infarction: Secondary | ICD-10-CM

## 2014-11-12 DIAGNOSIS — I10 Essential (primary) hypertension: Secondary | ICD-10-CM

## 2014-11-12 DIAGNOSIS — D329 Benign neoplasm of meninges, unspecified: Secondary | ICD-10-CM

## 2014-11-12 DIAGNOSIS — Z716 Tobacco abuse counseling: Secondary | ICD-10-CM

## 2014-11-12 LAB — HEMOGLOBIN A1C
Hgb A1c MFr Bld: 6 % — ABNORMAL HIGH (ref 4.8–5.6)
Mean Plasma Glucose: 126 mg/dL

## 2014-11-12 LAB — RPR: RPR Ser Ql: NONREACTIVE

## 2014-11-12 LAB — BASIC METABOLIC PANEL
ANION GAP: 8 (ref 5–15)
BUN: 19 mg/dL (ref 6–23)
CO2: 23 mmol/L (ref 19–32)
Calcium: 8.9 mg/dL (ref 8.4–10.5)
Chloride: 110 mmol/L (ref 96–112)
Creatinine, Ser: 0.77 mg/dL (ref 0.50–1.10)
GFR, EST NON AFRICAN AMERICAN: 86 mL/min — AB (ref >90)
Glucose, Bld: 80 mg/dL (ref 70–99)
Potassium: 3.7 mmol/L (ref 3.5–5.1)
Sodium: 141 mmol/L (ref 135–145)

## 2014-11-12 LAB — GLUCOSE, CAPILLARY
GLUCOSE-CAPILLARY: 149 mg/dL — AB (ref 70–99)
Glucose-Capillary: 75 mg/dL (ref 70–99)
Glucose-Capillary: 68 mg/dL — ABNORMAL LOW (ref 70–99)
Glucose-Capillary: 71 mg/dL (ref 70–99)
Glucose-Capillary: 108 mg/dL — ABNORMAL HIGH (ref 70–99)

## 2014-11-12 LAB — HIV ANTIBODY (ROUTINE TESTING W REFLEX): HIV SCREEN 4TH GENERATION: NONREACTIVE

## 2014-11-12 LAB — HOMOCYSTEINE: Homocysteine: 12.5 umol/L (ref 0.0–15.0)

## 2014-11-12 LAB — PROTEIN C, TOTAL: Protein C, Total: 100 % (ref 70–140)

## 2014-11-12 MED ORDER — DEXTROSE 50 % IV SOLN
25.0000 mL | INTRAVENOUS | Status: AC
  Administered 2014-11-12: 25 mL via INTRAVENOUS

## 2014-11-12 MED ORDER — PHENOBARBITAL 97.2 MG PO TABS
97.2000 mg | ORAL_TABLET | ORAL | Status: AC
  Administered 2014-11-12: 97.2 mg via ORAL
  Filled 2014-11-12: qty 1

## 2014-11-12 MED ORDER — DEXTROSE 50 % IV SOLN
INTRAVENOUS | Status: AC
  Administered 2014-11-12: 25 mL via INTRAVENOUS
  Filled 2014-11-12: qty 50

## 2014-11-12 NOTE — Progress Notes (Signed)
I was consulted by the nursing staff in my role as medical director for third floor. As patient was admitted initially with thought that she had a stroke and has been on the stroke protocol. She failed bedside swallowing evaluation. However she has since been evaluated by the neurologist and it is felt that what she has is a large frontal meningioma causing seizures that may have caused her neurological problems. He does not feel that she has a stroke currently. She should be taken off the stroke protocol she can take oral phenobarbital but she does need to have evaluation by speech therapy.

## 2014-11-12 NOTE — Progress Notes (Signed)
Patient ID: [name], female   DOB: 10/10/1945, 69 y.o.   MRN: [id number]   Alcan Border A. [name], MD     [email or web address]          [name] is an 69 y.o. female.   Assessment/Plan:  1. Recurrent episodes of dysphagia/speech impairment and confusion with negative imaging for ischemic events. Post-lesional phenomena seems most likely particularly epileptic events. 2. Baseline history of epilepsy apparently well-controlled although these recurrent events are suspicious for seizures. 3. Left frontal meningioma which is significant given the size and location potentially causing aphasia and seizures. 4. 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 There is also history of intracranial hemorrhage however.  RECOMMENDATION: Out patient neurosurgical consultation for excision of the meningioma. 30 day event monitor for possible cardioembolic events causing the previous large vessel chronic infarcts. Continuing phenobarbital at the higher dose. Level should be checked about 2 weeks out from now.   No new complaints are reported today. She continues to have dysarthria but not at baseline. She has improved since being admitted however. She is back on a diet and is doing well with this.  GENERAL: This a very pleasant thin female in no acute distress.  HEENT: Supple. Atraumatic normocephalic.   ABDOMEN: soft  EXTREMITIES: No edema   BACK: Normal.  SKIN: Normal by inspection.   MENTAL STATUS: Alert and oriented. Speech is moderately dysarthric. Comprehension and fluency are good.  CRANIAL NERVES: Pupils are equal, round and reactive to light and accommodation; extra ocular movements are full, there is no significant nystagmus; visual fields are full; upper and lower facial muscles are normal in strength and symmetric, there is no flattening of the nasolabial folds; tongue is  midline; uvula is midline; shoulder elevation is normal.  MOTOR: She has a left upper extremity monoparesis. Deltoid graded as 4/5. Hand grip to 3/5 with significant increased tone/spasticity associated with contracture of the hand. The other extremities shows normal tone, bulk and strength.  COORDINATION: Left finger to nose is normal, right finger to nose is normal, No rest tremor; no intention tremor; no postural tremor; no bradykinesia.   EEG shows single epileptiform discharge left anterior temporal area and the infrequent left hemispheric slowing.  Objective: Vital signs in last 24 hours: Temp:  [97.9 F (36.6 C)-98.9 F (37.2 C)] 98.6 F (37 C) (04/07 1441) Pulse Rate:  [66-85] 85 (04/07 1441) Resp:  [15-20] 18 (04/07 1441) BP: (122-139)/(70-81) 133/76 mmHg (04/07 1441) SpO2:  [100 %] 100 % (04/07 1441)  Intake/Output from previous day: 04/06 0701 - 04/07 0700 In: 0  Out: 550 [Urine:550] Intake/Output this shift: Total I/O In: 120 [P.O.:120] Out: 600 [Urine:600] Nutritional status: DIET DYS 3 Room service appropriate?: Yes; Fluid consistency:: Thin   Lab Results: Results for orders placed or performed during the hospital encounter of 11/10/14 (from the past 48 hour(s))  Hemoglobin A1c     Status: Abnormal   Collection Time: 11/10/14  9:00 PM  Result Value Ref Range   Hgb A1c MFr Bld 6.0 (H) 4.8 - 5.6 %    Comment: (NOTE)         Pre-diabetes: 5.7 - 6.4         Diabetes: >6.4         Glycemic control for adults with diabetes: <7.0    Mean Plasma Glucose 126 mg/dL    Comment: (NOTE) Performed At: [facility] [address] [place], [place] [id number]  Lindon Romp MD BJ:[id number]   Homocysteine, serum     Status: None   Collection Time: 11/10/14  9:00 PM  Result Value Ref Range   Homocysteine 12.5 0.0 - 15.0 umol/L    Comment: (NOTE) Performed At: [facility] [place], [place] [id number] Lindon Romp MD  GE:[id number]   Ethanol     Status: None   Collection Time: 11/10/14  9:04 PM  Result Value Ref Range   Alcohol, Ethyl (B) <5 0 - 9 mg/dL    Comment:        LOWEST DETECTABLE LIMIT FOR SERUM ALCOHOL IS 11 mg/dL FOR MEDICAL PURPOSES ONLY   Protime-INR     Status: None   Collection Time: 11/10/14  9:04 PM  Result Value Ref Range   Prothrombin Time 13.7 11.6 - 15.2 seconds   INR 1.04 0.00 - 1.49  APTT     Status: None   Collection Time: 11/10/14  9:04 PM  Result Value Ref Range   aPTT 26 24 - 37 seconds  CBC     Status: None   Collection Time: 11/10/14  9:04 PM  Result Value Ref Range   WBC 8.5 4.0 - 10.5 K/uL   RBC 4.09 3.87 - 5.11 MIL/uL   Hemoglobin 12.7 12.0 - 15.0 g/dL   HCT 37.2 36.0 - 46.0 %   MCV 91.0 78.0 - 100.0 fL   MCH 31.1 26.0 - 34.0 pg   MCHC 34.1 30.0 - 36.0 g/dL   RDW 14.0 11.5 - 15.5 %   Platelets 236 150 - 400 K/uL  Differential     Status: None   Collection Time: 11/10/14  9:04 PM  Result Value Ref Range   Neutrophils Relative % 53 43 - 77 %   Neutro Abs 4.5 1.7 - 7.7 K/uL   Lymphocytes Relative 36 12 - 46 %   Lymphs Abs 3.1 0.7 - 4.0 K/uL   Monocytes Relative 10 3 - 12 %   Monocytes Absolute 0.8 0.1 - 1.0 K/uL   Eosinophils Relative 1 0 - 5 %   Eosinophils Absolute 0.1 0.0 - 0.7 K/uL   Basophils Relative 0 0 - 1 %   Basophils Absolute 0.0 0.0 - 0.1 K/uL  Comprehensive metabolic panel     Status: Abnormal   Collection Time: 11/10/14  9:04 PM  Result Value Ref Range   Sodium 136 135 - 145 mmol/L   Potassium 3.4 (L) 3.5 - 5.1 mmol/L   Chloride 103 96 - 112 mmol/L   CO2 25 19 - 32 mmol/L   Glucose, Bld 129 (H) 70 - 99 mg/dL   BUN 14 6 - 23 mg/dL   Creatinine, Ser 0.80 0.50 - 1.10 mg/dL   Calcium 9.1 8.4 - 10.5 mg/dL   Total Protein 7.5 6.0 - 8.3 g/dL   Albumin 4.3 3.5 - 5.2 g/dL   AST 18 0 - 37 U/L   ALT 12 0 - 35 U/L   Alkaline Phosphatase 45 39 - 117 U/L   Total Bilirubin 0.4 0.3 - 1.2 mg/dL   GFR calc non Af Amer 75 (L) >90 mL/min   GFR  calc Af Amer 87 (L) >90 mL/min    Comment: (NOTE) The eGFR has been calculated using the CKD EPI equation. This calculation has not been validated in all clinical situations. eGFR's persistently <90 mL/min signify possible Chronic Kidney Disease.    Anion gap 8 5 - 15  I-Stat Troponin, ED (not at [facility])  Status: None   Collection Time: 11/10/14  9:10 PM  Result Value Ref Range   Troponin i, poc 0.00 0.00 - 0.08 ng/mL   Comment 3            Comment: Due to the release kinetics of cTnI, a negative result within the first hours of the onset of symptoms does not rule out myocardial infarction with certainty. If myocardial infarction is still suspected, repeat the test at appropriate intervals.   CBG monitoring, ED     Status: Abnormal   Collection Time: 11/10/14  9:10 PM  Result Value Ref Range   Glucose-Capillary 114 (H) 70 - 99 mg/dL  I-Stat Chem 8, ED     Status: Abnormal   Collection Time: 11/10/14  9:13 PM  Result Value Ref Range   Sodium 139 135 - 145 mmol/L   Potassium 3.8 3.5 - 5.1 mmol/L   Chloride 103 96 - 112 mmol/L   BUN 14 6 - 23 mg/dL   Creatinine, Ser 0.70 0.50 - 1.10 mg/dL   Glucose, Bld 128 (H) 70 - 99 mg/dL   Calcium, Ion 1.14 1.13 - 1.30 mmol/L   TCO2 22 0 - 100 mmol/L   Hemoglobin 13.6 12.0 - 15.0 g/dL   HCT 40.0 36.0 - 46.0 %  Urine Drug Screen     Status: Abnormal   Collection Time: 11/10/14 10:12 PM  Result Value Ref Range   Opiates NONE DETECTED NONE DETECTED   Cocaine NONE DETECTED NONE DETECTED   Benzodiazepines NONE DETECTED NONE DETECTED   Amphetamines NONE DETECTED NONE DETECTED   Tetrahydrocannabinol NONE DETECTED NONE DETECTED   Barbiturates POSITIVE (A) NONE DETECTED    Comment:        DRUG SCREEN FOR MEDICAL PURPOSES ONLY.  IF CONFIRMATION IS NEEDED FOR ANY PURPOSE, NOTIFY LAB WITHIN 5 DAYS.        LOWEST DETECTABLE LIMITS FOR URINE DRUG SCREEN Drug Class       Cutoff (ng/mL) Amphetamine      1000 Barbiturate       200 Benzodiazepine   707 Tricyclics       867 Opiates          300 Cocaine          300 THC              50   Urinalysis, Routine w reflex microscopic     Status: Abnormal   Collection Time: 11/10/14 10:12 PM  Result Value Ref Range   Color, Urine YELLOW YELLOW   APPearance CLEAR CLEAR   Specific Gravity, Urine 1.015 1.005 - 1.030   pH 5.5 5.0 - 8.0   Glucose, UA NEGATIVE NEGATIVE mg/dL   Hgb urine dipstick SMALL (A) NEGATIVE   Bilirubin Urine NEGATIVE NEGATIVE   Ketones, ur NEGATIVE NEGATIVE mg/dL   Protein, ur NEGATIVE NEGATIVE mg/dL   Urobilinogen, UA 0.2 0.0 - 1.0 mg/dL   Nitrite NEGATIVE NEGATIVE   Leukocytes, UA NEGATIVE NEGATIVE  Urine microscopic-add on     Status: None   Collection Time: 11/10/14 10:12 PM  Result Value Ref Range   Squamous Epithelial / LPF RARE RARE   WBC, UA 0-2 <3 WBC/hpf   RBC / HPF 0-2 <3 RBC/hpf   Bacteria, UA RARE RARE  Fasting lipid panel     Status: None   Collection Time: 11/11/14  6:30 AM  Result Value Ref Range   Cholesterol 167 0 - 200 mg/dL   Triglycerides 42 <150 mg/dL  HDL 72 >39 mg/dL   Total CHOL/HDL Ratio 2.3 RATIO   VLDL 8 0 - 40 mg/dL   LDL Cholesterol 87 0 - 99 mg/dL    Comment:        Total Cholesterol/HDL:CHD Risk Coronary Heart Disease Risk Table                     Men   Women  1/2 Average Risk   3.4   3.3  Average Risk       5.0   4.4  2 X Average Risk   9.6   7.1  3 X Average Risk  23.4   11.0        Use the calculated Patient Ratio above and the CHD Risk Table to determine the patient's CHD Risk.        ATP III CLASSIFICATION (LDL):  <100     mg/dL   Optimal  100-129  mg/dL   Near or Above                    Optimal  130-159  mg/dL   Borderline  160-189  mg/dL   High  >190     mg/dL   Very High   Antithrombin III     Status: None   Collection Time: 11/11/14  6:31 AM  Result Value Ref Range   AntiThromb III Func 99 75 - 120 %    Comment: Performed at [place], total     Status:  None   Collection Time: 11/11/14  6:31 AM  Result Value Ref Range   Protein C, Total 100 70 - 140 %    Comment: (NOTE) Performed At: [facility] [place], [place] [id number] Lindon Romp MD RA:[id number]   Phenobarbital level     Status: Abnormal   Collection Time: 11/11/14  6:31 AM  Result Value Ref Range   Phenobarbital 8.8 (L) 15.0 - 40.0 ug/mL  RPR     Status: None   Collection Time: 11/11/14  6:31 AM  Result Value Ref Range   RPR Ser Ql Non Reactive Non Reactive    Comment: (NOTE) Performed At: [facility] [place], [place] [id number] Lindon Romp MD [id number]   Vitamin B12     Status: None   Collection Time: 11/11/14  6:31 AM  Result Value Ref Range   Vitamin B-12 514 211 - 911 pg/mL    Comment: Performed at [facility]  Sedimentation rate     Status: None   Collection Time: 11/11/14  6:31 AM  Result Value Ref Range   Sed Rate 8 0 - 22 mm/hr  CBC     Status: Abnormal   Collection Time: 11/11/14  6:31 AM  Result Value Ref Range   WBC 6.0 4.0 - 10.5 K/uL   RBC 3.67 (L) 3.87 - 5.11 MIL/uL   Hemoglobin 11.4 (L) 12.0 - 15.0 g/dL   HCT 33.8 (L) 36.0 - 46.0 %   MCV 92.1 78.0 - 100.0 fL   MCH 31.1 26.0 - 34.0 pg   MCHC 33.7 30.0 - 36.0 g/dL   RDW 14.2 11.5 - 15.5 %   Platelets 205 150 - 400 K/uL  TSH     Status: None   Collection Time: 11/11/14  6:31 AM  Result Value Ref Range   TSH 4.256 0.350 - 4.500 uIU/mL  Folate     Status: None  Collection Time: 11/11/14  6:31 AM  Result Value Ref Range   Folate 9.9 ng/mL    Comment: (NOTE) Reference Ranges        Deficient:       0.4 - 3.3 ng/mL        Indeterminate:   3.4 - 5.4 ng/mL        Normal:              > 5.4 ng/mL Performed at [facility]   HIV antibody     Status: None   Collection Time: 11/11/14  6:31 AM  Result Value Ref Range   HIV Screen 4th Generation wRfx Non Reactive Non Reactive    Comment: (NOTE) Performed At: [facility] [address] [place], [place] [id number] Lindon Romp MD [id number]   Glucose, capillary     Status: None   Collection Time: 11/11/14  7:39 AM  Result Value Ref Range   Glucose-Capillary 91 70 - 99 mg/dL  Urine rapid drug screen (hosp performed)     Status: Abnormal   Collection Time: 11/11/14  7:59 AM  Result Value Ref Range   Opiates NONE DETECTED NONE DETECTED   Cocaine NONE DETECTED NONE DETECTED   Benzodiazepines NONE DETECTED NONE DETECTED   Amphetamines NONE DETECTED NONE DETECTED   Tetrahydrocannabinol NONE DETECTED NONE DETECTED   Barbiturates POSITIVE (A) NONE DETECTED    Comment:        DRUG SCREEN FOR MEDICAL PURPOSES ONLY.  IF CONFIRMATION IS NEEDED FOR ANY PURPOSE, NOTIFY LAB WITHIN 5 DAYS.        LOWEST DETECTABLE LIMITS FOR URINE DRUG SCREEN Drug Class       Cutoff (ng/mL) Amphetamine      1000 Barbiturate      200 Benzodiazepine   762 Tricyclics       831 Opiates          300 Cocaine          300 THC              50   Glucose, capillary     Status: None   Collection Time: 11/11/14 12:48 PM  Result Value Ref Range   Glucose-Capillary 92 70 - 99 mg/dL  Glucose, capillary     Status: None   Collection Time: 11/11/14  4:35 PM  Result Value Ref Range   Glucose-Capillary 88 70 - 99 mg/dL  Phenobarbital level     Status: Abnormal   Collection Time: 11/11/14  8:43 PM  Result Value Ref Range   Phenobarbital 8.7 (L) 15.0 - 40.0 ug/mL  Glucose, capillary     Status: None   Collection Time: 11/11/14  9:00 PM  Result Value Ref Range   Glucose-Capillary 74 70 - 99 mg/dL   Comment 1 Notify RN    Comment 2 Document in Chart   Glucose, capillary     Status: None   Collection Time: 11/12/14  2:07 AM  Result Value Ref Range   Glucose-Capillary 75 70 - 99 mg/dL  Basic metabolic panel     Status: Abnormal   Collection Time: 11/12/14  6:35 AM  Result Value Ref Range   Sodium 141 135 - 145 mmol/L   Potassium 3.7 3.5 - 5.1 mmol/L   Chloride 110  96 - 112 mmol/L   CO2 23 19 - 32 mmol/L   Glucose, Bld 80 70 - 99 mg/dL   BUN 19 6 - 23 mg/dL   Creatinine, Ser 0.77  0.50 - 1.10 mg/dL   Calcium 8.9 8.4 - 10.5 mg/dL   GFR calc non Af Amer 86 (L) >90 mL/min   GFR calc Af Amer >90 >90 mL/min    Comment: (NOTE) The eGFR has been calculated using the CKD EPI equation. This calculation has not been validated in all clinical situations. eGFR's persistently <90 mL/min signify possible Chronic Kidney Disease.    Anion gap 8 5 - 15  Glucose, capillary     Status: Abnormal   Collection Time: 11/12/14  7:46 AM  Result Value Ref Range   Glucose-Capillary 68 (L) 70 - 99 mg/dL  Glucose, capillary     Status: None   Collection Time: 11/12/14 11:37 AM  Result Value Ref Range   Glucose-Capillary 71 70 - 99 mg/dL  Glucose, capillary     Status: Abnormal   Collection Time: 11/12/14  4:37 PM  Result Value Ref Range   Glucose-Capillary 149 (H) 70 - 99 mg/dL    Lipid Panel  Recent Labs  11/11/14 0630  CHOL 167  TRIG 42  HDL 72  CHOLHDL 2.3  VLDL 8  LDLCALC 87    Studies/Results:   Medications:  Scheduled Meds: . antiseptic oral rinse  7 mL Mouth Rinse q12n4p  . atorvastatin  40 mg Oral QPM  . chlorhexidine  15 mL Mouth Rinse BID  . heparin  5,000 Units Subcutaneous 3 times per day  . levothyroxine  25 mcg Oral QAC breakfast  . PARoxetine  10 mg Oral Daily  . PHENobarbital  97.2 mg Oral QHS   Continuous Infusions: . sodium chloride 100 mL/hr at 11/12/14 1133   PRN Meds:.       Gurshan Settlemire A. [name], M.D.  Diplomate, Tax adviser of Psychiatry and Neurology ( Neurology).

## 2014-11-12 NOTE — Progress Notes (Signed)
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 Renal over 12 increased at bedtime we'll check level in a.m. [name] ZJI:[id number] DOB: 1945/12/12 DOA: 11/10/2014 PCP: Maricela Curet, MD             Physical Exam: Blood pressure 122/81, pulse 66, temperature 97.9 F (36.6 C), temperature source Oral, resp. rate 15, height 5' 4.5" (1.638 m), weight 103 lb 2.8 oz (46.8 kg), SpO2 100 %. lungs show prolonged inspiratory  Phase scattered rhonchi no rales no wheezes appreciable heart regular rhythm no S3 or S4 no heaves thrills rubs abdomen soft nontender bowel sounds normoactive   Investigations:  No results found for this or any previous visit (from the past 240 hour(s)).   Basic Metabolic Panel:  Recent Labs  11/10/14 2104 11/10/14 2113 11/12/14 0635  NA 136 139 141  K 3.4* 3.8 3.7  CL 103 103 110  CO2 25  --  23  GLUCOSE 129* 128* 80  BUN 14 14 19   CREATININE 0.80 0.70 0.77  CALCIUM 9.1  --  8.9   Liver Function Tests:  Recent Labs  11/10/14 2104  AST 18  ALT 12  ALKPHOS 45  BILITOT 0.4  PROT 7.5  ALBUMIN 4.3     CBC:  Recent Labs  11/10/14 2104 11/10/14 2113 11/11/14 0631  WBC 8.5  --  6.0  NEUTROABS 4.5  --   --   HGB 12.7 13.6 11.4*  HCT 37.2 40.0 33.8*  MCV 91.0  --  92.1  PLT 236  --  205    Ct Head Wo Contrast  11/10/2014   CLINICAL DATA:  Sudden onset left-sided weakness with expressive aphasia ; left-sided mouth drooping  EXAM: CT HEAD WITHOUT CONTRAST  TECHNIQUE: Contiguous axial images were obtained from the base of the skull through the vertex without intravenous contrast.  COMPARISON:  September 24, 2006  FINDINGS: Enlargement of the atria of the  lateral ventricles, particularly on the right, remain stable. This enlargement is felt to be due to encephalomalacia. There is no change in ventricular size and configuration compared to prior study. There is no intracranial mass, hemorrhage, extra-axial fluid collection, or midline shift. There is evidence of a prior infarct in each parietal lobe, larger on the right than on the left, stable. There is small vessel disease throughout the centra semiovale bilaterally. There is decreased attenuation in the region of the left mid insular cortex concerning for potential acute infarct. There is also decreased attenuation in the left lentiform nucleus which may be indicative of recent/acute infarct. Each middle cerebral artery has normal attenuation. The bony calvarium appears intact. The mastoid air cells are clear.  IMPRESSION: Question recent/acute infarct in the left insular cortex and lentiform nucleus. There is small vessel disease throughout the centra semiovale bilaterally. Prior infarcts in both parietal lobes, larger on the right than on the left, stable. No hemorrhage or mass effect. Areas of encephalomalacia with enlargement of the atria of the lateral ventricles, particularly on the right, stable.  Critical Value/emergent results were called by telephone at the time of interpretation on 11/10/2014 at 9:09 pm to Dr. Thayer Jew , who verbally acknowledged these results.   Electronically Signed   By: Lowella Grip III M.D.   On:  11/10/2014 21:09   Mr Jodene Nam Head Wo Contrast  11/11/2014   CLINICAL DATA:  Altered mental status an weakness of 2 days duration. Abnormal head CT.  EXAM: MRI HEAD WITHOUT CONTRAST  MRA HEAD WITHOUT CONTRAST  TECHNIQUE: Multiplanar, multiecho pulse sequences of the brain and surrounding structures were obtained without intravenous contrast. Angiographic images of the head were obtained using MRA technique without contrast.  COMPARISON:  Head CT 11/10/2014.  FINDINGS: MRI HEAD  FINDINGS  There is a 3 cm diameter extra-axial mass at the left frontoparietal convexity typical of meningioma. This indents the brain and is associated with mild regional vasogenic edema.  Diffusion imaging does not show any acute or subacute infarction.  There chronic small-vessel ischemic changes affecting the pons. No cerebellar insult. Elsewhere in the cerebral hemispheres, there are old parieto-occipital infarctions, more extensive on the right than the left, with atrophy and encephalomalacia. There is old ischemic change more diffusely in the right temporal lobe. Residual hemosiderin is seen in the regions of those infarctions. There chronic small-vessel ischemic changes of the deep white matter. No hydrocephalus. No intra-axial mass lesion. No pituitary mass. No inflammatory sinus disease. No skull base lesion.  MRA HEAD FINDINGS  Both internal carotid arteries are widely patent into the brain. No siphon stenosis. The anterior and middle cerebral vessels are patent without proximal stenosis, aneurysm or vascular malformation.  Both vertebral arteries are widely patent to the basilar. No basilar stenosis. Posterior circulation branch vessels are patent proximally. There is distal vessel narrowing in the PCA branches.  IMPRESSION: 3 cm meningioma at the left frontoparietal convexity with mass effect upon the brain and mild regional vasogenic edema.  Old PCA infarctions bilaterally. Chronic small-vessel ischemic changes throughout the brain.  No large or medium vessel MR angiographic abnormality.   Electronically Signed   By: Nelson Chimes M.D.   On: 11/11/2014 11:46   Mri Brain Without Contrast  11/11/2014   CLINICAL DATA:  Altered mental status an weakness of 2 days duration. Abnormal head CT.  EXAM: MRI HEAD WITHOUT CONTRAST  MRA HEAD WITHOUT CONTRAST  TECHNIQUE: Multiplanar, multiecho pulse sequences of the brain and surrounding structures were obtained without intravenous contrast. Angiographic images of  the head were obtained using MRA technique without contrast.  COMPARISON:  Head CT 11/10/2014.  FINDINGS: MRI HEAD FINDINGS  There is a 3 cm diameter extra-axial mass at the left frontoparietal convexity typical of meningioma. This indents the brain and is associated with mild regional vasogenic edema.  Diffusion imaging does not show any acute or subacute infarction.  There chronic small-vessel ischemic changes affecting the pons. No cerebellar insult. Elsewhere in the cerebral hemispheres, there are old parieto-occipital infarctions, more extensive on the right than the left, with atrophy and encephalomalacia. There is old ischemic change more diffusely in the right temporal lobe. Residual hemosiderin is seen in the regions of those infarctions. There chronic small-vessel ischemic changes of the deep white matter. No hydrocephalus. No intra-axial mass lesion. No pituitary mass. No inflammatory sinus disease. No skull base lesion.  MRA HEAD FINDINGS  Both internal carotid arteries are widely patent into the brain. No siphon stenosis. The anterior and middle cerebral vessels are patent without proximal stenosis, aneurysm or vascular malformation.  Both vertebral arteries are widely patent to the basilar. No basilar stenosis. Posterior circulation branch vessels are patent proximally. There is distal vessel narrowing in the PCA branches.  IMPRESSION: 3 cm meningioma at the left frontoparietal convexity with mass effect upon the  brain and mild regional vasogenic edema.  Old PCA infarctions bilaterally. Chronic small-vessel ischemic changes throughout the brain.  No large or medium vessel MR angiographic abnormality.   Electronically Signed   By: Nelson Chimes M.D.   On: 11/11/2014 11:46   US Carotid Bilateral  11/11/2014   CLINICAL DATA:  History of stroke (2009 and 2015) hypertension and smoking.  EXAM: BILATERAL CAROTID DUPLEX ULTRASOUND  TECHNIQUE: Pearline Cables scale imaging, color Doppler and duplex ultrasound were  performed of bilateral carotid and vertebral arteries in the neck.  COMPARISON:  Brain MRI - 11/11/2014  FINDINGS: Criteria: Quantification of carotid stenosis is based on velocity parameters that correlate the residual internal carotid diameter with NASCET-based stenosis levels, using the diameter of the distal internal carotid lumen as the denominator for stenosis measurement.  The following velocity measurements were obtained:  RIGHT  ICA:  64/21 cm/sec  CCA:  06/23 cm/sec  SYSTOLIC ICA/CCA RATIO:  1.0  DIASTOLIC ICA/CCA RATIO:  1.3  ECA:  55 cm/sec  LEFT  ICA:  69/24 cm/sec  CCA:  76/28 cm/sec  SYSTOLIC ICA/CCA RATIO:  1.3  DIASTOLIC ICA/CCA RATIO:  1.3  ECA:  69 cm/sec  RIGHT CAROTID ARTERY: There is a moderate to large amount of eccentric mixed echogenic atherosclerotic plaque throughout the right common carotid artery (representative images 3 and 7). There is a moderate amount of eccentric mixed echogenic plaque within the right carotid bulb (images 14 and 16). There is a large amount of eccentric mixed echogenic plaque involving the origin and proximal aspect of the right internal carotid artery (image 24), not resulting in elevated peak systolic velocities within the interrogated course of the right internal carotid artery to suggest a hemodynamically significant stenosis.  RIGHT VERTEBRAL ARTERY:  Antegrade flow  LEFT CAROTID ARTERY: There is a minimal amount of eccentric mixed echogenic plaque within the mid aspect the left common carotid artery (image 41). There is a minimal to moderate amount of eccentric mixed echogenic plaque within the left carotid bulb (images 49 and 50, extending to involve the origin and proximal aspect of the left internal carotid artery (image 58), not resulting in elevated peak systolic velocities within the interrogated course of the left internal carotid artery to suggest a hemodynamically significant stenosis.  LEFT VERTEBRAL ARTERY:  Antegrade flow  IMPRESSION: Moderate to  large amount of bilateral atherosclerotic plaque, right greater than left, not resulting in a hemodynamically significant stenosis.   Electronically Signed   By: Sandi Mariscal M.D.   On: 11/11/2014 12:35      Medications:   Impression: Seizure activity COPD  Active Problems:   HTN (hypertension)   Tobacco use   Meningioma     Plan: Increase her phenobarbital at bedtime. Check phenobarbital level in a.m. swallowing evaluation today  Consultants: Neurology cardiology   Procedures   Antibiotics:                   Code Status:   Family Communication:  Spoke with 2 sisters and daughter in room  Disposition Plan consider discharge in 24 hours  Time spent: 30 minutes     Kaileia Flow M   11/12/2014, 1:22 PM

## 2014-11-12 NOTE — Progress Notes (Signed)
UR completed 

## 2014-11-12 NOTE — Progress Notes (Signed)
EEG Completed; Results Pending  

## 2014-11-12 NOTE — Progress Notes (Signed)
Dr. [name] returned call from the night shift nurse.  He was informed that the patient is NPO until Speech Therapy evaluates her at 11:00 am today because she failed her bedside swallow screen in the ED on admission per the stroke protocol.  The patient was not able to receive her ordered dose of Phenobarbital due to her NPO status.  Dr. [name] gave an order for Phenobarbital to be given po now.  I informed him again that according to the Stroke Protocol the patient has to remain NPO.  He asked if I could do another bedside swallow and I told him that once the patient fails, it cannot be repeated and the ST has to evaluate before any po's to be given, according to the stroke protocol.  He stated "I am giving you an order to give her the phenobarbital po now."  An order was entered to give the phenobarbital now.

## 2014-11-12 NOTE — Procedures (Signed)
  [place] A. [name], MD     [email or web address]           HISTORY: The patient presents with recurrent spells of dysarthria and aphasia suspicious for seizures. There is a baseline history of seizures.  MEDICATIONS: Scheduled Meds: . antiseptic oral rinse  7 mL Mouth Rinse q12n4p  . atorvastatin  40 mg Oral QPM  . chlorhexidine  15 mL Mouth Rinse BID  . heparin  5,000 Units Subcutaneous 3 times per day  . levothyroxine  25 mcg Oral QAC breakfast  . PARoxetine  10 mg Oral Daily  . PHENobarbital  97.2 mg Oral QHS   Continuous Infusions: . sodium chloride 100 mL/hr at 11/12/14 1133   PRN Meds:.  Prior to Admission medications   Medication Sig Start Date End Date Taking? Authorizing Provider  amLODipine-benazepril (LOTREL) 5-10 MG per capsule Take 1 capsule by mouth daily.   Yes Historical Provider, MD  aspirin EC 81 MG tablet Take 81 mg by mouth every evening.   Yes Historical Provider, MD  atorvastatin (LIPITOR) 40 MG tablet Take 40 mg by mouth every evening.    Yes Historical Provider, MD  labetalol (NORMODYNE) 100 MG tablet Take 100 mg by mouth 2 (two) times daily.   Yes Historical Provider, MD  levothyroxine (SYNTHROID, LEVOTHROID) 25 MCG tablet Take 25 mcg by mouth daily before breakfast.   Yes Historical Provider, MD  PARoxetine (PAXIL) 10 MG tablet  10/26/14  Yes Historical Provider, MD  PHENobarbital (LUMINAL) 64.8 MG tablet Take 64.8 mg by mouth at bedtime.   Yes Historical Provider, MD      ANALYSIS: A 16 channel recording using standard 10 20 measurements is conducted for 24 minutes. There is a well-formed posterior dominant rhythm of 10 a half hertz which attenuates with eye opening. There is beta activity observed in the frontal areas. Awake and drowsy activities are observed. Photic stimulation and hyperventilation are not carried out. The patient had a single sharp wave activity was worse at F7. There is also infrequent left hemispheric 2 to 3  Hz slow wave activity.    IMPRESSION: This recording is mildly abnormal for the following reasons: 1. Single epileptiform discharge involving the left anterior temporal area. 2. Infrequent left hemispheric slowing.      Knowledge Escandon A. [name], M.D.  Diplomate, Tax adviser of Psychiatry and Neurology ( Neurology).

## 2014-11-12 NOTE — Progress Notes (Signed)
The medical director for Dept. 300, Dr. Luan Pulling, was notified of the situation with the phenobarbital and the patient being NPO until seen by ST, Dr. Luan Pulling informed me that he would speak with Dr. [name] and to hold the phenobarbital for now.

## 2014-11-12 NOTE — Consult Note (Signed)
CARDIOLOGY CONSULT NOTE   Patient ID: [name] MRN: [id number] DOB/AGE: 08-28-1945 69 y.o.  Admit Date: 11/10/2014 Referring Physician: Lorriane Shire, Richard MD Primary Physician: Maricela Curet, MD Consulting Cardiologist: Kate Sable MD Primary Cardiologist New Reason for Consultation: Bilateral CVA, question need for 30 day cardiac monitor.  Clinical Summary [name] is a 69 y.o.female with no prior cardiac history but with history of hypertension, hemorrhagic CVA, ongoing tobacco abuse, admitted with sudden onset of aphasia, found to have acute CVA in the left insular cortex and lentiform nucleus, with evidence of prior CVA's in both parietal lobes, right >left with encephalomalacia. She has been followed by neurology, Dr. [name] for history of seizures and AMS. She was also found to have a moderate sized left frontal exta-axial mass consistent with meningioma. Due to recurrent CVA, we are asked for cardiology recommendations concerning need to place cardiac monitor.   Her niece at bedside and sister by phone state that she had sudden shivering and facial changes, along with right sided facial drooping and suddenly could not speak. Her sister states she was having hallucinations as well.   In ER she was found to be hypertensive with BP of 154/114, HR 88, O2 sat 99%, potassium 3.4. EKG NSR with LVH. No acute ST T wave abnormalities.    Allergies  Allergen Reactions  . Codeine     Medications Scheduled Medications: . antiseptic oral rinse  7 mL Mouth Rinse q12n4p  . aspirin  300 mg Rectal Daily  . atorvastatin  40 mg Oral QPM  . chlorhexidine  15 mL Mouth Rinse BID  . dexamethasone  10 mg Intravenous Once  . heparin  5,000 Units Subcutaneous 3 times per day  . levothyroxine  25 mcg Oral QAC breakfast  . PARoxetine  10 mg Oral Daily  . PHENobarbital  97.2 mg Oral QHS  . phenobarbital  97.2 mg Oral NOW    Infusions: . sodium chloride 100 mL/hr at 11/12/14  0812    PRN Medications:     Past Medical History  Diagnosis Date  . Seizures   . Hypertension   . Stroke 2009, 2015 9/27  . Thyroid disease     Past Surgical History  Procedure Laterality Date  . Peg tube placement    . Peg tube removal      Family History  Problem Relation Age of Onset  . Coronary artery disease      Stents   . Stroke Father     Hemorrhagic   . Sudden death Father   . Ulcers Mother     Bleeding    Social History [name] reports that she has been smoking Cigarettes.  She has a 75 pack-year smoking history. She does not have any smokeless tobacco history on file. [name] reports that she does not drink alcohol.  Review of Systems Complete review of systems are found to be negative unless outlined in H&P above.  Physical Examination Blood pressure 122/81, pulse 66, temperature 97.9 F (36.6 C), temperature source Oral, resp. rate 15, height 5' 4.5" (1.638 m), weight 103 lb 2.8 oz (46.8 kg), SpO2 100 %.  Intake/Output Summary (Last 24 hours) at 11/12/14 0845 Last data filed at 11/12/14 0821  Gross per 24 hour  Intake      0 ml  Output    750 ml  Net   -750 ml    Telemetry: NSR  GEN: No acute distress wants coffee.  HEENT: Conjunctiva and lids  normal, oropharynx clear with moist mucosa. Neck: Supple, no elevated JVP or carotid bruits, no thyromegaly. Lungs: Clear to auscultation, nonlabored breathing at rest. Cardiac: Regular rate and rhythm, no S3 or significant systolic murmur, no pericardial rub. Abdomen: Soft, nontender, no hepatomegaly, bowel sounds present, no guarding or rebound. Extremities: No pitting edema, distal pulses 2+. Skin: Warm and dry. Musculoskeletal: No kyphosis. Left arm and hand contractures.  Neuropsychiatric: Alert and oriented x3, affect grossly appropriate. Dysphasic, with slurred speech. Right facial droop. No weakness in the left leg.   Prior Cardiac Testing/Procedures  1.Echocardiogram 11/11/2014 Left  ventricle: The cavity size was normal. Wall thickness was increased in a pattern of mild LVH. Systolic function was normal. The estimated ejection fraction was in the range of 60% to 65%. Wall motion was normal; there were no regional wall motion abnormalities. Doppler parameters are consistent with abnormal left ventricular relaxation (grade 1 diastolic dysfunction). - Aortic valve: There was trivial regurgitation. - Mitral valve: Mildly calcified annulus. There was trivial regurgitation. - Atrial septum: No defect or patent foramen ovale was identified. - Tricuspid valve: There was mild regurgitation.  Carotid Artery Ultrasound 11/11/2014 Moderate to large amount of bilateral atherosclerotic plaque, right greater than left, not resulting in a hemodynamically significant Stenosis.  Lab Results  Basic Metabolic Panel:  Recent Labs Lab 11/10/14 2104 11/10/14 2113 11/12/14 0635  NA 136 139 141  K 3.4* 3.8 3.7  CL 103 103 110  CO2 25  --  23  GLUCOSE 129* 128* 80  BUN 14 14 19   CREATININE 0.80 0.70 0.77  CALCIUM 9.1  --  8.9    Liver Function Tests:  Recent Labs Lab 11/10/14 2104  AST 18  ALT 12  ALKPHOS 45  BILITOT 0.4  PROT 7.5  ALBUMIN 4.3    CBC:  Recent Labs Lab 11/10/14 2104 11/10/14 2113 11/11/14 0631  WBC 8.5  --  6.0  NEUTROABS 4.5  --   --   HGB 12.7 13.6 11.4*  HCT 37.2 40.0 33.8*  MCV 91.0  --  92.1  PLT 236  --  205    Radiology: Ct Head Wo Contrast  11/10/2014   CLINICAL DATA:  Sudden onset left-sided weakness with expressive aphasia ; left-sided mouth drooping  EXAM: CT HEAD WITHOUT CONTRAST  TECHNIQUE: Contiguous axial images were obtained from the base of the skull through the vertex without intravenous contrast.  COMPARISON:  September 24, 2006  FINDINGS: Enlargement of the atria of the lateral ventricles, particularly on the right, remain stable. This enlargement is felt to be due to encephalomalacia. There is no change in  ventricular size and configuration compared to prior study. There is no intracranial mass, hemorrhage, extra-axial fluid collection, or midline shift. There is evidence of a prior infarct in each parietal lobe, larger on the right than on the left, stable. There is small vessel disease throughout the centra semiovale bilaterally. There is decreased attenuation in the region of the left mid insular cortex concerning for potential acute infarct. There is also decreased attenuation in the left lentiform nucleus which may be indicative of recent/acute infarct. Each middle cerebral artery has normal attenuation. The bony calvarium appears intact. The mastoid air cells are clear.  IMPRESSION: Question recent/acute infarct in the left insular cortex and lentiform nucleus. There is small vessel disease throughout the centra semiovale bilaterally. Prior infarcts in both parietal lobes, larger on the right than on the left, stable. No hemorrhage or mass effect. Areas of encephalomalacia with enlargement  of the atria of the lateral ventricles, particularly on the right, stable.  Critical Value/emergent results were called by telephone at the time of interpretation on 11/10/2014 at 9:09 pm to Dr. Thayer Jew , who verbally acknowledged these results.   Electronically Signed   By: Lowella Grip III M.D.   On: 11/10/2014 21:09   Mr Jodene Nam Head Wo Contrast  11/11/2014   CLINICAL DATA:  Altered mental status an weakness of 2 days duration. Abnormal head CT.  EXAM: MRI HEAD WITHOUT CONTRAST  MRA HEAD WITHOUT CONTRAST  TECHNIQUE: Multiplanar, multiecho pulse sequences of the brain and surrounding structures were obtained without intravenous contrast. Angiographic images of the head were obtained using MRA technique without contrast.  COMPARISON:  Head CT 11/10/2014.  FINDINGS: MRI HEAD FINDINGS  There is a 3 cm diameter extra-axial mass at the left frontoparietal convexity typical of meningioma. This indents the brain and is  associated with mild regional vasogenic edema.  Diffusion imaging does not show any acute or subacute infarction.  There chronic small-vessel ischemic changes affecting the pons. No cerebellar insult. Elsewhere in the cerebral hemispheres, there are old parieto-occipital infarctions, more extensive on the right than the left, with atrophy and encephalomalacia. There is old ischemic change more diffusely in the right temporal lobe. Residual hemosiderin is seen in the regions of those infarctions. There chronic small-vessel ischemic changes of the deep white matter. No hydrocephalus. No intra-axial mass lesion. No pituitary mass. No inflammatory sinus disease. No skull base lesion.  MRA HEAD FINDINGS  Both internal carotid arteries are widely patent into the brain. No siphon stenosis. The anterior and middle cerebral vessels are patent without proximal stenosis, aneurysm or vascular malformation.  Both vertebral arteries are widely patent to the basilar. No basilar stenosis. Posterior circulation branch vessels are patent proximally. There is distal vessel narrowing in the PCA branches.  IMPRESSION: 3 cm meningioma at the left frontoparietal convexity with mass effect upon the brain and mild regional vasogenic edema.  Old PCA infarctions bilaterally. Chronic small-vessel ischemic changes throughout the brain.  No large or medium vessel MR angiographic abnormality.   Electronically Signed   By: Nelson Chimes M.D.   On: 11/11/2014 11:46   Mri Brain Without Contrast  11/11/2014   CLINICAL DATA:  Altered mental status an weakness of 2 days duration. Abnormal head CT.  EXAM: MRI HEAD WITHOUT CONTRAST  MRA HEAD WITHOUT CONTRAST  TECHNIQUE: Multiplanar, multiecho pulse sequences of the brain and surrounding structures were obtained without intravenous contrast. Angiographic images of the head were obtained using MRA technique without contrast.  COMPARISON:  Head CT 11/10/2014.  FINDINGS: MRI HEAD FINDINGS  There is a 3 cm  diameter extra-axial mass at the left frontoparietal convexity typical of meningioma. This indents the brain and is associated with mild regional vasogenic edema.  Diffusion imaging does not show any acute or subacute infarction.  There chronic small-vessel ischemic changes affecting the pons. No cerebellar insult. Elsewhere in the cerebral hemispheres, there are old parieto-occipital infarctions, more extensive on the right than the left, with atrophy and encephalomalacia. There is old ischemic change more diffusely in the right temporal lobe. Residual hemosiderin is seen in the regions of those infarctions. There chronic small-vessel ischemic changes of the deep white matter. No hydrocephalus. No intra-axial mass lesion. No pituitary mass. No inflammatory sinus disease. No skull base lesion.  MRA HEAD FINDINGS  Both internal carotid arteries are widely patent into the brain. No siphon stenosis. The anterior and middle  cerebral vessels are patent without proximal stenosis, aneurysm or vascular malformation.  Both vertebral arteries are widely patent to the basilar. No basilar stenosis. Posterior circulation branch vessels are patent proximally. There is distal vessel narrowing in the PCA branches.  IMPRESSION: 3 cm meningioma at the left frontoparietal convexity with mass effect upon the brain and mild regional vasogenic edema.  Old PCA infarctions bilaterally. Chronic small-vessel ischemic changes throughout the brain.  No large or medium vessel MR angiographic abnormality.   Electronically Signed   By: Nelson Chimes M.D.   On: 11/11/2014 11:46   US Carotid Bilateral  11/11/2014   CLINICAL DATA:  History of stroke (2009 and 2015) hypertension and smoking.  EXAM: BILATERAL CAROTID DUPLEX ULTRASOUND  TECHNIQUE: Pearline Cables scale imaging, color Doppler and duplex ultrasound were performed of bilateral carotid and vertebral arteries in the neck.  COMPARISON:  Brain MRI - 11/11/2014  FINDINGS: Criteria: Quantification of  carotid stenosis is based on velocity parameters that correlate the residual internal carotid diameter with NASCET-based stenosis levels, using the diameter of the distal internal carotid lumen as the denominator for stenosis measurement.  The following velocity measurements were obtained:  RIGHT  ICA:  64/21 cm/sec  CCA:  14/97 cm/sec  SYSTOLIC ICA/CCA RATIO:  1.0  DIASTOLIC ICA/CCA RATIO:  1.3  ECA:  55 cm/sec  LEFT  ICA:  69/24 cm/sec  CCA:  02/63 cm/sec  SYSTOLIC ICA/CCA RATIO:  1.3  DIASTOLIC ICA/CCA RATIO:  1.3  ECA:  69 cm/sec  RIGHT CAROTID ARTERY: There is a moderate to large amount of eccentric mixed echogenic atherosclerotic plaque throughout the right common carotid artery (representative images 3 and 7). There is a moderate amount of eccentric mixed echogenic plaque within the right carotid bulb (images 14 and 16). There is a large amount of eccentric mixed echogenic plaque involving the origin and proximal aspect of the right internal carotid artery (image 24), not resulting in elevated peak systolic velocities within the interrogated course of the right internal carotid artery to suggest a hemodynamically significant stenosis.  RIGHT VERTEBRAL ARTERY:  Antegrade flow  LEFT CAROTID ARTERY: There is a minimal amount of eccentric mixed echogenic plaque within the mid aspect the left common carotid artery (image 41). There is a minimal to moderate amount of eccentric mixed echogenic plaque within the left carotid bulb (images 49 and 50, extending to involve the origin and proximal aspect of the left internal carotid artery (image 58), not resulting in elevated peak systolic velocities within the interrogated course of the left internal carotid artery to suggest a hemodynamically significant stenosis.  LEFT VERTEBRAL ARTERY:  Antegrade flow  IMPRESSION: Moderate to large amount of bilateral atherosclerotic plaque, right greater than left, not resulting in a hemodynamically significant stenosis.    Electronically Signed   By: Sandi Mariscal M.D.   On: 11/11/2014 12:35     ECG: NSR with LVH, rate of 89 bpm   Impression and Recommendations  1.Acute Right Hemorrhagic CVA with dysphasia:  She denies racing HR, palpitations or chest pain. EKG is without ST T wave abnormalities indicative of ACS. CVA is hemorrhagic. Would not continue ASA until source of bleeding is found, may be microvacular in etiology with associated hypertension.. Do not thing that cardiac monitor will change medical management at this time as embolic CVA is not found to be the source of CVA. No evidence of atrial fib on EKG or by symptoms.  Will discuss further with Dr. Bronson Ing.    2.CVRF for CAD:  Has not had CV work up in the past. Risk factors include FH (sister), hypertension, thyroid disease, tobacco abuse. Echo demonstrated normal LV fx and LA size. No evidence for amyloidosis per echo leading to bleeding tendencies. Can consider ischemic evaluation, but history of hemorrhage, if need for PCI with  DAPT would be prohibited.   3. Hypertension: BP elevated on admission. She denies medical non-compliance. Niece at bedside states that medications are provided by the patient's sister who cares for her.   4.Thyroid disease:  TSH is normal.   5. Ongoing tobacco abuse: She admits to smoking for over 40 years and has no plans to quit. :"I want one now!" Recommendations for cessation fall on deaf ears.     Signed: Phill Myron. Lawrence NP [facility]  11/12/2014, 8:45 AM Co-Sign MD  The patient was seen and examined, and I have reviewed the assessment and plan and have made modifications as noted below.  Pt admitted with seizure activity felt secondary to meningioma with mass effect and surrounding edema. Has been given Decadron for this. Neurology is consulting with neurosurgery with respect to surgical removal. She has old bilateral PCA infarcts as seen by MRI and carotid Dopplers demonstrated significant bilateral plaque  disease. The etiology of her old PCA infarcts is more likely related to carotid plaque disease and less likely atrial fibrillation I suspect. I have reviewed all current and old available ECG's and there is no evidence for atrial fibrillation. No symptoms of palpitations as well. I am not certain that an event monitor would be of very high yield at the present time.  However, if she were to demonstrate evidence in the future of atrial fibrillation either by ECG or if she were to develop palpitations, this issue could be reinvestigated.

## 2014-11-12 NOTE — Evaluation (Signed)
Clinical/Bedside Swallow Evaluation Patient Details  Name: [name] MRN: [id number] Date of Birth: Jul 24, 1946  Today's Date: 11/12/2014 Time: SLP Start Time (ACUTE ONLY): 1115 SLP Stop Time (ACUTE ONLY): 1149 SLP Time Calculation (min) (ACUTE ONLY): 34 min  Past Medical History:  Past Medical History  Diagnosis Date  . Seizures   . Hypertension   . Stroke 2009, 2015 9/27  . Thyroid disease    Past Surgical History:  Past Surgical History  Procedure Laterality Date  . Peg tube placement    . Peg tube removal     HPI:  [name] is is 69 year old black female who presents with the acute onset of impaired speech and confusion. The patient had a similar event about 6 months ago had a very extensive workup at that time including MRI/MRA, carotid and echocardiography. The workup was unrevealing at that time. We did have suspicion that the events could've been an epileptic event and she was to get her phenobarbital increase but it appears this was not done. She also was passed in outpatient follow-up but this was not done. The patient at baseline seems to function fairly well with the baseline mild to moderate dysarthria per the niece. She is able to talk relatively fluently until last night when she developed the acute dysarthria/aphasia. Her symptoms lasted for several hours but has improved. However, the niece reports that she's not baseline. She failed RN swallow screen (although test was not administered due to history of dysphagia). She does not report worsening focal neurological symptoms. 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 No loss of consciousness reported. No chest pain, shortness of breath, GI GU symptoms. MRI shows: There is a moderate-sized is an extra-axial left frontal mass homogeneous with mild associated vasogenic edema. There is a large right parietal occipital encephalomalacia extending to the  posterior horn of the lateral ventricle. There is also a smaller size encephalomalacia involving the left occipital lobe. There is a small encephalomalacia involving the lentiform nucleus on the left. Nothing acute is seen.   Assessment / Plan / Recommendation Clinical Impression  [name] is known to this SLP from previous admission. She does not appear to be experiencing aphasia at this time, however speech is dysarthric with moderately reduced speech intelligibility. Family reports that it is not quite back to baseline (she has baseline dysarthria from previous strokes), but is better. She has decreased labial sensation on left lower lip and decreased lingual movement for elevation and lateralization tasks. Pt assessed with dentures in and out. Pharyngeal swallow appears WFL, however is negatively impacted by mild/mod oral phase dysphagia. Pt has difficulty with mastication due to dentured status (a little loose and decreased proprioception) and poor lingual movement. She needed frequent reminders to refrain from talking during po intake and this did result in some coughing from residuals. Sister reports that she had to perform the Heimlich maneuver on pt when she choked on a balogna sandwhich. Recommend D3/mech soft with thin liquids with strict use of aspiration precautions (no talking during meals, masticate foods thoroughly, sweep tongue laterally, alternate solids and liquids, and ensure oral cavity is clear between bites. Recommendations reviewed with pt and family and acknowledge the same. SLP will follow while in acute setting. She may benefit from trial period speech therapy in home health or outpatient setting for dysphagia (oral motor exercises and compensatory strategy training) and dysarthria.     Aspiration Risk  Mild  Diet Recommendation Dysphagia 3 (Mechanical Soft);Thin liquid   Liquid Administration via: Cup;Straw Medication Administration: Whole meds with liquid Supervision:  Patient able to self feed;Full supervision/cueing for compensatory strategies Compensations: Slow rate;Small sips/bites;Check for pocketing;Multiple dry swallows after each bite/sip;Follow solids with liquid Postural Changes and/or Swallow Maneuvers: Seated upright 90 degrees;Upright 30-60 min after meal    Other  Recommendations Oral Care Recommendations: Oral care BID;Patient independent with oral care Other Recommendations: Clarify dietary restrictions   Follow Up Recommendations  Home health SLP;Outpatient SLP    Frequency and Duration min 2x/week  1 week   Pertinent Vitals/Pain VSS   SLP Swallow Goals   Pt will demonstrate safe and efficient consumption of least restrictive diet with use of strategies as needed.   Swallow Study Prior Functional Status   Lives at home with husband    General Date of Onset: 11/10/14 HPI: [name] is is 69 year old black female who presents with the acute onset of impaired speech and confusion. The patient had a similar event about 6 months ago had a very extensive workup at that time including MRI/MRA, carotid and echocardiography. The workup was unrevealing at that time. We did have suspicion that the events could've been an epileptic event and she was to get her phenobarbital increase but it appears this was not done. She also was passed in outpatient follow-up but this was not done. The patient at baseline seems to function fairly well with the baseline mild to moderate dysarthria per the niece. She is able to talk relatively fluently until last night when she developed the acute dysarthria/aphasia. Her symptoms lasted for several hours but has improved. However, the niece reports that she's not baseline. She failed RN swallow screen (although test was not administered due to history of dysphagia). She does not report worsening focal neurological symptoms. 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 No loss of consciousness reported. No chest pain, shortness of breath, GI GU symptoms. MRI shows: There is a moderate-sized is an extra-axial left frontal mass homogeneous with mild associated vasogenic edema. There is a large right parietal occipital encephalomalacia extending to the posterior horn of the lateral ventricle. There is also a smaller size encephalomalacia involving the left occipital lobe. There is a small encephalomalacia involving the lentiform nucleus on the left. Nothing acute is seen. Type of Study: Bedside swallow evaluation Diet Prior to this Study: NPO Temperature Spikes Noted: No Respiratory Status: Room air History of Recent Intubation: No Behavior/Cognition: Alert;Cooperative;Pleasant mood Oral Cavity - Dentition: Dentures, top;Dentures, bottom Self-Feeding Abilities: Able to feed self Patient Positioning: Upright in bed Baseline Vocal Quality: Clear Volitional Cough: Strong Volitional Swallow: Able to elicit    Oral/Motor/Sensory Function Overall Oral Motor/Sensory Function: Impaired at baseline Labial ROM: Within Functional Limits Labial Symmetry: Within Functional Limits Labial Strength: Within Functional Limits Labial Sensation: Reduced Lingual ROM: Reduced right;Reduced left Lingual Symmetry: Abnormal symmetry right Lingual Strength: Reduced Lingual Sensation: Reduced Facial ROM: Within Functional Limits Facial Symmetry: Within Functional Limits Facial Strength: Within Functional Limits Facial Sensation: Within Functional Limits Velum: Impaired right;Impaired left Mandible: Within Functional Limits   Ice Chips Ice chips: Within functional limits Presentation: Spoon   Thin Liquid Thin Liquid: Impaired Presentation: Cup;Self Fed;Straw;Spoon Oral Phase Impairments: Reduced labial seal;Reduced lingual movement/coordination Oral Phase Functional Implications: Left anterior spillage    Nectar Thick Nectar Thick Liquid: Not tested    Honey Thick Honey Thick Liquid: Not tested   Puree  Puree: Impaired Presentation: Spoon Oral Phase Impairments: Reduced lingual movement/coordination;Impaired anterior to posterior transit Oral Phase Functional Implications: Prolonged oral transit;Oral residue Pharyngeal Phase Impairments: Cough - Delayed   Solid   GO Functional Assessment Tool Used: clinical judgement Functional Limitations: Swallowing Swallow Current Status (M6286): At least 20 percent but less than 40 percent impaired, limited or restricted Swallow Goal Status [phone]): At least 1 percent but less than 20 percent impaired, limited or restricted  Solid: Impaired Oral Phase Impairments: Reduced lingual movement/coordination;Impaired anterior to posterior transit;Impaired mastication Oral Phase Functional Implications: Oral residue Other Comments: Pt needs cues to refrain from talking during po intake       Thank you,  Genene Churn, [place]  Velina Drollinger 11/12/2014,1:53 PM

## 2014-11-13 ENCOUNTER — Other Ambulatory Visit: Payer: Self-pay

## 2014-11-13 DIAGNOSIS — R1310 Dysphagia, unspecified: Secondary | ICD-10-CM

## 2014-11-13 DIAGNOSIS — I639 Cerebral infarction, unspecified: Secondary | ICD-10-CM

## 2014-11-13 LAB — BASIC METABOLIC PANEL
ANION GAP: 5 (ref 5–15)
BUN: 14 mg/dL (ref 6–23)
CO2: 25 mmol/L (ref 19–32)
Calcium: 8.3 mg/dL — ABNORMAL LOW (ref 8.4–10.5)
Chloride: 112 mmol/L (ref 96–112)
Creatinine, Ser: 0.69 mg/dL (ref 0.50–1.10)
GFR calc Af Amer: >90 mL/min (ref >90)
GFR, EST NON AFRICAN AMERICAN: 89 mL/min — AB (ref >90)
GLUCOSE: 109 mg/dL — AB (ref 70–99)
POTASSIUM: 3.7 mmol/L (ref 3.5–5.1)
Sodium: 142 mmol/L (ref 135–145)

## 2014-11-13 LAB — BETA-2-GLYCOPROTEIN I ABS, IGG/M/A
Beta-2 Glyco I IgG: <9 GPI IgG units (ref 0–20)
Beta-2-Glycoprotein I IgA: 14 GPI IgA units (ref 0–25)
Beta-2-Glycoprotein I IgM: <9 GPI IgM units (ref 0–32)

## 2014-11-13 LAB — PROTHROMBIN GENE MUTATION

## 2014-11-13 LAB — PHENOBARBITAL LEVEL: Phenobarbital: 11.3 ug/mL — ABNORMAL LOW (ref 15.0–40.0)

## 2014-11-13 LAB — FACTOR 5 LEIDEN

## 2014-11-13 LAB — GLUCOSE, CAPILLARY
GLUCOSE-CAPILLARY: 95 mg/dL (ref 70–99)
GLUCOSE-CAPILLARY: 100 mg/dL — AB (ref 70–99)

## 2014-11-13 LAB — CARDIOLIPIN ANTIBODIES, IGG, IGM, IGA
Anticardiolipin IgG: <9 GPL U/mL (ref 0–14)
Anticardiolipin IgM: <9 MPL U/mL (ref 0–12)

## 2014-11-13 MED ORDER — PHENOBARBITAL 97.2 MG PO TABS: 97.2000 mg | ORAL_TABLET | Freq: Every day | ORAL | Status: DC

## 2014-11-13 NOTE — Progress Notes (Signed)
Pts niece called nurse to check pt d/t pt unable to talk. Nurse assessed pt. Pt is having trouble with speech. Baseline, pt is aphasic. Other than speech, pt has no other abnormalities apart from baseline. VSS. Notified Dr Everette Rank. No new orders given at this time. Dr Everette Rank said pt does not need a repeat CT scan and  to just monitor pt right now.

## 2014-11-13 NOTE — Discharge Summary (Signed)
Physician Discharge Summary  [name] HMC:[id number] DOB: March 11, 1946 DOA: 11/10/2014  PCP: Maricela Curet, MD  Admit date: 11/10/2014 Discharge date: 11/13/2014   Recommendations for Outpatient Follow-up:  Patient is strongly advised to have smoking cessation counseling and and medicines my office within one week's time for assessment of phenobarbital level symptoms and for referral for neurosurgery to deal with meningioma as a possible cause of possible seizure-like activity resulting in worsening a fascia Discharge Diagnoses:  Active Problems:   HTN (hypertension)   Tobacco use   Meningioma   Discharge Condition:   Filed Weights   11/10/14 2115 11/11/14 0100  Weight: 110 lb (49.896 kg) 103 lb 2.8 oz (46.8 kg)    History of present illness:  Patient is 69 year old white female with heavy tobacco abuse of refusal to quit by counseling and Chantix she has a history of bilateral multiple cerebral infarcts previously along with intracerebral bleed documented by hemosiderin deposition cerebral cortex likewise his hypertension hyperlipidemia presented with worsening aphasia out to be a CVA in progress initial CT scan was questionable for CCP a subsequent MRI revealed a frontal meningioma and multiple bilateral old infarcts possible hemosiderin deposition indicating insurable bleed previously which was documented D echo revealed normal systolic function she was in sinus rhythm or hypertension and lipids were well controlled throughout hospital stay she was seen in consultation by neurology MRI revealed frontal meningioma and it was considered that this worsening aphasia may be an variant form of seizure take about a meningioma she had her phenobarbital dosage increased from 64-97 mg at bedtime barbital level was pending at the time of this discharge she is referred back to my office within a week's time for counseling on smoking cessation and referral to neurosurgery for consideration of  possible intervention for meningioma if he feels this is clinically responsible for possible seizure activity  Hospital Course:  See history of present illness  Procedures:    Consultations:  Neurology and cardiology  Discharge Instructions  Discharge Instructions    Discharge instructions    Complete by:  As directed      Discharge patient    Complete by:  As directed             Medication List    STOP taking these medications        PARoxetine 10 MG tablet  Commonly known as:  PAXIL      TAKE these medications        amLODipine-benazepril 5-10 MG per capsule  Commonly known as:  LOTREL  Take 1 capsule by mouth daily.     aspirin EC 81 MG tablet  Take 81 mg by mouth every evening.     atorvastatin 40 MG tablet  Commonly known as:  LIPITOR  Take 40 mg by mouth every evening.     labetalol 100 MG tablet  Commonly known as:  NORMODYNE  Take 100 mg by mouth 2 (two) times daily.     levothyroxine 25 MCG tablet  Commonly known as:  SYNTHROID, LEVOTHROID  Take 25 mcg by mouth daily before breakfast.     PHENobarbital 97.2 MG tablet  Commonly known as:  LUMINAL  Take 1 tablet (97.2 mg total) by mouth at bedtime.       Allergies  Allergen Reactions  . Codeine        Follow-up Information    Follow up with Phillips Odor, MD On 12/01/2014.   Specialty:  Neurology   Why:  APPT  FOR TUESDAY APRIL 26,2016 AT 9:30   Contact information:   [address] [place] [facility] [address] [phone]       Follow up with [facility]. Go in 2 weeks.   Specialty:  Neurosurgery   Contact information:   [address] [place] [address] [phone]        The results of significant diagnostics from this hospitalization (including imaging, microbiology, ancillary and laboratory) are listed below for reference.    Significant Diagnostic Studies: Ct Head Wo Contrast  11/10/2014   CLINICAL DATA:  Sudden onset  left-sided weakness with expressive aphasia ; left-sided mouth drooping  EXAM: CT HEAD WITHOUT CONTRAST  TECHNIQUE: Contiguous axial images were obtained from the base of the skull through the vertex without intravenous contrast.  COMPARISON:  September 24, 2006  FINDINGS: Enlargement of the atria of the lateral ventricles, particularly on the right, remain stable. This enlargement is felt to be due to encephalomalacia. There is no change in ventricular size and configuration compared to prior study. There is no intracranial mass, hemorrhage, extra-axial fluid collection, or midline shift. There is evidence of a prior infarct in each parietal lobe, larger on the right than on the left, stable. There is small vessel disease throughout the centra semiovale bilaterally. There is decreased attenuation in the region of the left mid insular cortex concerning for potential acute infarct. There is also decreased attenuation in the left lentiform nucleus which may be indicative of recent/acute infarct. Each middle cerebral artery has normal attenuation. The bony calvarium appears intact. The mastoid air cells are clear.  IMPRESSION: Question recent/acute infarct in the left insular cortex and lentiform nucleus. There is small vessel disease throughout the centra semiovale bilaterally. Prior infarcts in both parietal lobes, larger on the right than on the left, stable. No hemorrhage or mass effect. Areas of encephalomalacia with enlargement of the atria of the lateral ventricles, particularly on the right, stable.  Critical Value/emergent results were called by telephone at the time of interpretation on 11/10/2014 at 9:09 pm to Dr. Thayer Jew , who verbally acknowledged these results.   Electronically Signed   By: Lowella Grip III M.D.   On: 11/10/2014 21:09   Mr Jodene Nam Head Wo Contrast  11/11/2014   CLINICAL DATA:  Altered mental status an weakness of 2 days duration. Abnormal head CT.  EXAM: MRI HEAD WITHOUT CONTRAST   MRA HEAD WITHOUT CONTRAST  TECHNIQUE: Multiplanar, multiecho pulse sequences of the brain and surrounding structures were obtained without intravenous contrast. Angiographic images of the head were obtained using MRA technique without contrast.  COMPARISON:  Head CT 11/10/2014.  FINDINGS: MRI HEAD FINDINGS  There is a 3 cm diameter extra-axial mass at the left frontoparietal convexity typical of meningioma. This indents the brain and is associated with mild regional vasogenic edema.  Diffusion imaging does not show any acute or subacute infarction.  There chronic small-vessel ischemic changes affecting the pons. No cerebellar insult. Elsewhere in the cerebral hemispheres, there are old parieto-occipital infarctions, more extensive on the right than the left, with atrophy and encephalomalacia. There is old ischemic change more diffusely in the right temporal lobe. Residual hemosiderin is seen in the regions of those infarctions. There chronic small-vessel ischemic changes of the deep white matter. No hydrocephalus. No intra-axial mass lesion. No pituitary mass. No inflammatory sinus disease. No skull base lesion.  MRA HEAD FINDINGS  Both internal carotid arteries are widely patent into the brain.  No siphon stenosis. The anterior and middle cerebral vessels are patent without proximal stenosis, aneurysm or vascular malformation.  Both vertebral arteries are widely patent to the basilar. No basilar stenosis. Posterior circulation branch vessels are patent proximally. There is distal vessel narrowing in the PCA branches.  IMPRESSION: 3 cm meningioma at the left frontoparietal convexity with mass effect upon the brain and mild regional vasogenic edema.  Old PCA infarctions bilaterally. Chronic small-vessel ischemic changes throughout the brain.  No large or medium vessel MR angiographic abnormality.   Electronically Signed   By: Nelson Chimes M.D.   On: 11/11/2014 11:46   Mri Brain Without Contrast  11/11/2014    CLINICAL DATA:  Altered mental status an weakness of 2 days duration. Abnormal head CT.  EXAM: MRI HEAD WITHOUT CONTRAST  MRA HEAD WITHOUT CONTRAST  TECHNIQUE: Multiplanar, multiecho pulse sequences of the brain and surrounding structures were obtained without intravenous contrast. Angiographic images of the head were obtained using MRA technique without contrast.  COMPARISON:  Head CT 11/10/2014.  FINDINGS: MRI HEAD FINDINGS  There is a 3 cm diameter extra-axial mass at the left frontoparietal convexity typical of meningioma. This indents the brain and is associated with mild regional vasogenic edema.  Diffusion imaging does not show any acute or subacute infarction.  There chronic small-vessel ischemic changes affecting the pons. No cerebellar insult. Elsewhere in the cerebral hemispheres, there are old parieto-occipital infarctions, more extensive on the right than the left, with atrophy and encephalomalacia. There is old ischemic change more diffusely in the right temporal lobe. Residual hemosiderin is seen in the regions of those infarctions. There chronic small-vessel ischemic changes of the deep white matter. No hydrocephalus. No intra-axial mass lesion. No pituitary mass. No inflammatory sinus disease. No skull base lesion.  MRA HEAD FINDINGS  Both internal carotid arteries are widely patent into the brain. No siphon stenosis. The anterior and middle cerebral vessels are patent without proximal stenosis, aneurysm or vascular malformation.  Both vertebral arteries are widely patent to the basilar. No basilar stenosis. Posterior circulation branch vessels are patent proximally. There is distal vessel narrowing in the PCA branches.  IMPRESSION: 3 cm meningioma at the left frontoparietal convexity with mass effect upon the brain and mild regional vasogenic edema.  Old PCA infarctions bilaterally. Chronic small-vessel ischemic changes throughout the brain.  No large or medium vessel MR angiographic abnormality.    Electronically Signed   By: Nelson Chimes M.D.   On: 11/11/2014 11:46   US Carotid Bilateral  11/11/2014   CLINICAL DATA:  History of stroke (2009 and 2015) hypertension and smoking.  EXAM: BILATERAL CAROTID DUPLEX ULTRASOUND  TECHNIQUE: Pearline Cables scale imaging, color Doppler and duplex ultrasound were performed of bilateral carotid and vertebral arteries in the neck.  COMPARISON:  Brain MRI - 11/11/2014  FINDINGS: Criteria: Quantification of carotid stenosis is based on velocity parameters that correlate the residual internal carotid diameter with NASCET-based stenosis levels, using the diameter of the distal internal carotid lumen as the denominator for stenosis measurement.  The following velocity measurements were obtained:  RIGHT  ICA:  64/21 cm/sec  CCA:  98/33 cm/sec  SYSTOLIC ICA/CCA RATIO:  1.0  DIASTOLIC ICA/CCA RATIO:  1.3  ECA:  55 cm/sec  LEFT  ICA:  69/24 cm/sec  CCA:  82/50 cm/sec  SYSTOLIC ICA/CCA RATIO:  1.3  DIASTOLIC ICA/CCA RATIO:  1.3  ECA:  69 cm/sec  RIGHT CAROTID ARTERY: There is a moderate to large amount of eccentric mixed echogenic atherosclerotic  plaque throughout the right common carotid artery (representative images 3 and 7). There is a moderate amount of eccentric mixed echogenic plaque within the right carotid bulb (images 14 and 16). There is a large amount of eccentric mixed echogenic plaque involving the origin and proximal aspect of the right internal carotid artery (image 24), not resulting in elevated peak systolic velocities within the interrogated course of the right internal carotid artery to suggest a hemodynamically significant stenosis.  RIGHT VERTEBRAL ARTERY:  Antegrade flow  LEFT CAROTID ARTERY: There is a minimal amount of eccentric mixed echogenic plaque within the mid aspect the left common carotid artery (image 41). There is a minimal to moderate amount of eccentric mixed echogenic plaque within the left carotid bulb (images 49 and 50, extending to involve the origin  and proximal aspect of the left internal carotid artery (image 58), not resulting in elevated peak systolic velocities within the interrogated course of the left internal carotid artery to suggest a hemodynamically significant stenosis.  LEFT VERTEBRAL ARTERY:  Antegrade flow  IMPRESSION: Moderate to large amount of bilateral atherosclerotic plaque, right greater than left, not resulting in a hemodynamically significant stenosis.   Electronically Signed   By: Sandi Mariscal M.D.   On: 11/11/2014 12:35    Microbiology: No results found for this or any previous visit (from the past 240 hour(s)).   Labs: Basic Metabolic Panel:  Recent Labs Lab 11/10/14 2104 11/10/14 2113 11/12/14 0635  NA 136 139 141  K 3.4* 3.8 3.7  CL 103 103 110  CO2 25  --  23  GLUCOSE 129* 128* 80  BUN 14 14 19   CREATININE 0.80 0.70 0.77  CALCIUM 9.1  --  8.9   Liver Function Tests:  Recent Labs Lab 11/10/14 2104  AST 18  ALT 12  ALKPHOS 45  BILITOT 0.4  PROT 7.5  ALBUMIN 4.3   No results for input(s): LIPASE, AMYLASE in the last 168 hours. No results for input(s): AMMONIA in the last 168 hours. CBC:  Recent Labs Lab 11/10/14 2104 11/10/14 2113 11/11/14 0631  WBC 8.5  --  6.0  NEUTROABS 4.5  --   --   HGB 12.7 13.6 11.4*  HCT 37.2 40.0 33.8*  MCV 91.0  --  92.1  PLT 236  --  205   Cardiac Enzymes: No results for input(s): CKTOTAL, CKMB, CKMBINDEX, TROPONINI in the last 168 hours. BNP: BNP (last 3 results) No results for input(s): BNP in the last 8760 hours.  ProBNP (last 3 results) No results for input(s): PROBNP in the last 8760 hours.  CBG:  Recent Labs Lab 11/12/14 0207 11/12/14 0746 11/12/14 1137 11/12/14 1637 11/12/14 2044  GLUCAP 75 68* 71 149* 108*       Signed:  Carmine Carrozza M  [facility] Pager: [phone] 11/13/2014, 6:39 AM

## 2014-11-14 LAB — PROTEIN S ACTIVITY: PROTEIN S ACTIVITY: 81 % (ref 60–145)

## 2014-11-14 LAB — LUPUS ANTICOAGULANT PANEL
DRVVT: 35.1 s (ref 0.0–55.1)
PTT Lupus Anticoagulant: 35.2 s (ref 0.0–50.0)

## 2014-11-14 LAB — PROTEIN C ACTIVITY: PROTEIN C ACTIVITY: 130 % (ref 74–151)

## 2014-11-14 LAB — PROTEIN S, TOTAL: PROTEIN S AG TOTAL: 120 % (ref 58–150)

## 2014-11-14 NOTE — Progress Notes (Signed)
NURSING PROGRESS NOTE  [name] [id number] Discharge Data: 11/14/2014 7:11 AM Attending Provider: No att. providers found SKA:[id number] Brandi Mages, MD   [name] to be D/C'd Home per MD order.    All IV's will be discontinued and monitored for bleeding.  All belongings will be returned to patient for patient to take home.  AVS summary and prescriptions reviewed with patient.  Patient left floor via wheelchair, escorted by NT.  Last Documented Vital Signs:  Blood pressure 135/72, pulse 81, temperature 98.3 F (36.8 C), temperature source Oral, resp. rate 18, height 5' 4.5" (1.638 m), weight 46.8 kg (103 lb 2.8 oz), SpO2 100 %.  [name] D

## 2014-11-17 ENCOUNTER — Other Ambulatory Visit: Payer: Self-pay | Admitting: Neurology

## 2015-01-06 ENCOUNTER — Other Ambulatory Visit: Payer: Self-pay | Admitting: *Deleted

## 2015-01-06 DIAGNOSIS — I639 Cerebral infarction, unspecified: Secondary | ICD-10-CM

## 2015-06-04 ENCOUNTER — Other Ambulatory Visit: Payer: Self-pay | Admitting: Neurosurgery

## 2015-06-04 DIAGNOSIS — D329 Benign neoplasm of meninges, unspecified: Secondary | ICD-10-CM

## 2015-06-08 ENCOUNTER — Other Ambulatory Visit: Payer: Self-pay | Admitting: Neurosurgery

## 2015-06-14 ENCOUNTER — Ambulatory Visit
Admission: RE | Admit: 2015-06-14 | Discharge: 2015-06-14 | Disposition: A | Discharge status: Home / Self Care | Payer: Medicare HMO | Source: Ambulatory Visit | Attending: Neurosurgery | Admitting: Neurosurgery

## 2015-06-14 DIAGNOSIS — D329 Benign neoplasm of meninges, unspecified: Secondary | ICD-10-CM

## 2015-06-14 MED ORDER — GADOBENATE DIMEGLUMINE 529 MG/ML IV SOLN
9.0000 mL | Freq: Once | INTRAVENOUS | Status: AC | PRN
  Administered 2015-06-14: 9 mL via INTRAVENOUS

## 2015-06-16 ENCOUNTER — Encounter (HOSPITAL_COMMUNITY)
Admission: RE | Admit: 2015-06-16 | Discharge: 2015-06-16 | Disposition: A | Discharge status: Home / Self Care | Payer: Medicare HMO | Source: Ambulatory Visit | Attending: Neurosurgery | Admitting: Neurosurgery

## 2015-06-16 ENCOUNTER — Encounter (HOSPITAL_COMMUNITY): Payer: Self-pay

## 2015-06-16 HISTORY — DX: Aphasia: R47.01

## 2015-06-16 LAB — BASIC METABOLIC PANEL
ANION GAP: 9 (ref 5–15)
BUN: 10 mg/dL (ref 6–20)
CO2: 25 mmol/L (ref 22–32)
Calcium: 9.4 mg/dL (ref 8.9–10.3)
Chloride: 109 mmol/L (ref 101–111)
Creatinine, Ser: 0.57 mg/dL (ref 0.44–1.00)
GFR calc Af Amer: >60 mL/min (ref >60)
GFR calc non Af Amer: >60 mL/min (ref >60)
Glucose, Bld: 103 mg/dL — ABNORMAL HIGH (ref 65–99)
POTASSIUM: 4 mmol/L (ref 3.5–5.1)
Sodium: 143 mmol/L (ref 135–145)

## 2015-06-16 LAB — CBC
HEMATOCRIT: 35.9 % — AB (ref 36.0–46.0)
Hemoglobin: 11.8 g/dL — ABNORMAL LOW (ref 12.0–15.0)
MCH: 30.1 pg (ref 26.0–34.0)
MCHC: 32.9 g/dL (ref 30.0–36.0)
MCV: 91.6 fL (ref 78.0–100.0)
Platelets: 219 10*3/uL (ref 150–400)
RBC: 3.92 MIL/uL (ref 3.87–5.11)
RDW: 14.6 % (ref 11.5–15.5)
WBC: 6.1 10*3/uL (ref 4.0–10.5)

## 2015-06-16 LAB — TYPE AND SCREEN
ABO/RH(D): A POS
ANTIBODY SCREEN: NEGATIVE

## 2015-06-16 LAB — ABO/RH: ABO/RH(D): A POS

## 2015-06-16 NOTE — Pre-Procedure Instructions (Signed)
    [name]  06/16/2015      [facility] - [place], [name] [address] Phone: [phone] Fax: [phone]    Your procedure is scheduled on Friday, November 11th, 2016.  Report to [facility] Admitting at 11:30 A.M.  Call this number if you have problems the morning of surgery:  [phone]   If you have other questions, please call [phone], Monday-Friday, 8 AM - 4PM   Remember:  Do not eat food or drink liquids after midnight.   Take these medicines the morning of surgery with A SIP OF WATER: Labetalol (Normodyne), Levothyroxine (Synthroid), Paroxetine (Paxil)  Stop taking: Aspirin, NSAIDS, Aleve, Naproxen, Ibuprofen, Advil, Motrin, BC's, Goody's, Fish oil, all herbal medications, and all vitamins.     Do not wear jewelry, make-up or nail polish.  Do not wear lotions, powders, or perfumes.  You may wear deodorant.  Do not shave 48 hours prior to surgery.    Do not bring valuables to the hospital.  [facility] is not responsible for any belongings or valuables.  Contacts, dentures or bridgework may not be worn into surgery.  Leave your suitcase in the car.  After surgery it may be brought to your room.  For patients admitted to the hospital, discharge time will be determined by your treatment team.  Patients discharged the day of surgery will not be allowed to drive home.   Special instructions:  See attached.   Please read over the following fact sheets that you were given. Pain Booklet, Coughing and Deep Breathing, Blood Transfusion Information and Surgical Site Infection Prevention

## 2015-06-16 NOTE — Progress Notes (Signed)
PCP - Dr. Lucia Gaskins Cardiologist - denies  EKG - 11/11/2014  Epic CXR- denies  Echo- 11/2014 - Epic Stress test/Cardiac Cath - denies  Patient denies shortness of breath and chest pain at PAT appointment.    Patient presents to PAT appointment with sister, POA, Oswald Hillock.  Patient able to answer some questions with the assistance of Ms. Javier Glazier.  Patient is alert, oriented to person, situation, and place but not to time.  Ms. Javier Glazier states that this is patient's baseline.

## 2015-06-17 MED ORDER — CEFAZOLIN SODIUM-DEXTROSE 2-3 GM-% IV SOLR
2.0000 g | INTRAVENOUS | Status: AC
  Administered 2015-06-18: 2 g via INTRAVENOUS
  Filled 2015-06-17: qty 50

## 2015-06-17 NOTE — Progress Notes (Signed)
Anesthesia Chart Review:  Pt is a 69 year old female scheduled for stereotactic L craniotomy for resection of meningioma on 06/18/2015 with Dr. Kathyrn Sheriff.   PCP is Dr. Lucia Gaskins  PMH includes: HTN, stroke (x4), aphasia, seizures, thyroid disease. Current smoker. BMI 16.   PAT RN notes indicate pt is oriented only to person, place and situation but is not oriented to time.   Medications include: amlodipine-benazepril, ASA, lipitor, labetalol, levothyroxine, phenobarbital.   Preoperative labs reviewed.    EKG 11/10/14: Sinus rhythm. Borderline left axis deviation. Low voltage, extremity leads. Abnormal R-wave progression, early transition  Cardiac event monitor 4/8-12/12/14: - sinus rhythm with PACs - symptoms correlated with both sinus rhythm and sinus with PACs  Echo 11/11/14: - Left ventricle: The cavity size was normal. Wall thickness was increased in a pattern of mild LVH. Systolic function was normal. The estimated ejection fraction was in the range of 60% to 65%. Wall motion was normal; there were no regional wall motion abnormalities. Doppler parameters are consistent with abnormal left ventricular relaxation (grade 1 diastolic dysfunction). - Aortic valve: There was trivial regurgitation. - Mitral valve: Mildly calcified annulus. There was trivial regurgitation. - Atrial septum: No defect or patent foramen ovale was identified. - Tricuspid valve: There was mild regurgitation.  Carotid duplex US 05/04/14: Moderate heterogeneous and partially calcified plaque of the bilateral extracranial cerebral vascular circulation without significant stenosis identified by duplex criteria.  If no changes, I anticipate pt can proceed with surgery as scheduled.   Willeen Cass, FNP-BC [facility]/Anesthesiology Phone: [phone] 06/17/2015 9:24 AM

## 2015-06-18 ENCOUNTER — Inpatient Hospital Stay (HOSPITAL_COMMUNITY): Payer: Medicare HMO | Admitting: Certified Registered Nurse Anesthetist

## 2015-06-18 ENCOUNTER — Inpatient Hospital Stay (HOSPITAL_COMMUNITY): Payer: Medicare HMO

## 2015-06-18 ENCOUNTER — Inpatient Hospital Stay (HOSPITAL_COMMUNITY): Payer: Medicare HMO | Admitting: Vascular Surgery

## 2015-06-18 ENCOUNTER — Encounter (HOSPITAL_COMMUNITY)
Admission: AD | Disposition: A | Discharge status: Home / Self Care | Payer: Self-pay | Source: Ambulatory Visit | Attending: Neurosurgery

## 2015-06-18 ENCOUNTER — Inpatient Hospital Stay (HOSPITAL_COMMUNITY)
Admission: AD | Admit: 2015-06-18 | Discharge: 2015-06-23 | DRG: 025 | Disposition: A | Discharge status: Home / Self Care | Payer: Medicare HMO | Source: Ambulatory Visit | Attending: Neurosurgery | Admitting: Neurosurgery

## 2015-06-18 ENCOUNTER — Encounter (HOSPITAL_COMMUNITY): Payer: Self-pay | Admitting: Certified Registered Nurse Anesthetist

## 2015-06-18 DIAGNOSIS — R471 Dysarthria and anarthria: Secondary | ICD-10-CM | POA: Diagnosis present

## 2015-06-18 DIAGNOSIS — Z8673 Personal history of transient ischemic attack (TIA), and cerebral infarction without residual deficits: Secondary | ICD-10-CM | POA: Diagnosis not present

## 2015-06-18 DIAGNOSIS — Z01812 Encounter for preprocedural laboratory examination: Secondary | ICD-10-CM

## 2015-06-18 DIAGNOSIS — I10 Essential (primary) hypertension: Secondary | ICD-10-CM | POA: Diagnosis present

## 2015-06-18 DIAGNOSIS — Z885 Allergy status to narcotic agent status: Secondary | ICD-10-CM | POA: Diagnosis not present

## 2015-06-18 DIAGNOSIS — R569 Unspecified convulsions: Secondary | ICD-10-CM | POA: Diagnosis present

## 2015-06-18 DIAGNOSIS — E039 Hypothyroidism, unspecified: Secondary | ICD-10-CM | POA: Diagnosis present

## 2015-06-18 DIAGNOSIS — R4701 Aphasia: Secondary | ICD-10-CM | POA: Diagnosis present

## 2015-06-18 DIAGNOSIS — G936 Cerebral edema: Secondary | ICD-10-CM | POA: Diagnosis present

## 2015-06-18 DIAGNOSIS — D320 Benign neoplasm of cerebral meninges: Principal | ICD-10-CM | POA: Diagnosis present

## 2015-06-18 DIAGNOSIS — Z7982 Long term (current) use of aspirin: Secondary | ICD-10-CM | POA: Diagnosis not present

## 2015-06-18 DIAGNOSIS — Z0183 Encounter for blood typing: Secondary | ICD-10-CM | POA: Diagnosis not present

## 2015-06-18 DIAGNOSIS — D329 Benign neoplasm of meninges, unspecified: Secondary | ICD-10-CM | POA: Diagnosis present

## 2015-06-18 DIAGNOSIS — F17200 Nicotine dependence, unspecified, uncomplicated: Secondary | ICD-10-CM | POA: Diagnosis present

## 2015-06-18 HISTORY — PX: CRANIOTOMY: SHX93

## 2015-06-18 SURGERY — CRANIOTOMY TUMOR EXCISION
Anesthesia: General | Site: Head

## 2015-06-18 MED ORDER — PAROXETINE HCL 20 MG PO TABS
10.0000 mg | ORAL_TABLET | Freq: Every day | ORAL | Status: DC
  Administered 2015-06-19 – 2015-06-23 (×5): 10 mg via ORAL
  Filled 2015-06-18 (×5): qty 1

## 2015-06-18 MED ORDER — AMLODIPINE BESYLATE 5 MG PO TABS
5.0000 mg | ORAL_TABLET | Freq: Every day | ORAL | Status: DC
  Administered 2015-06-18 – 2015-06-23 (×6): 5 mg via ORAL
  Filled 2015-06-18 (×6): qty 1

## 2015-06-18 MED ORDER — PROMETHAZINE HCL 12.5 MG PO TABS
12.5000 mg | ORAL_TABLET | ORAL | Status: DC | PRN
  Filled 2015-06-18: qty 2

## 2015-06-18 MED ORDER — PANTOPRAZOLE SODIUM 40 MG IV SOLR
40.0000 mg | Freq: Every day | INTRAVENOUS | Status: DC
  Administered 2015-06-18 – 2015-06-19 (×2): 40 mg via INTRAVENOUS
  Filled 2015-06-18 (×2): qty 40

## 2015-06-18 MED ORDER — DOCUSATE SODIUM 100 MG PO CAPS
100.0000 mg | ORAL_CAPSULE | Freq: Two times a day (BID) | ORAL | Status: DC
  Administered 2015-06-18 – 2015-06-23 (×10): 100 mg via ORAL
  Filled 2015-06-18 (×10): qty 1

## 2015-06-18 MED ORDER — LEVOTHYROXINE SODIUM 25 MCG PO TABS
25.0000 ug | ORAL_TABLET | Freq: Every day | ORAL | Status: DC
  Administered 2015-06-19 – 2015-06-23 (×5): 25 ug via ORAL
  Filled 2015-06-18 (×5): qty 1

## 2015-06-18 MED ORDER — PHENYLEPHRINE HCL 10 MG/ML IJ SOLN
10.0000 mg | INTRAVENOUS | Status: DC | PRN
  Administered 2015-06-18: 10 ug/min via INTRAVENOUS

## 2015-06-18 MED ORDER — ONDANSETRON HCL 4 MG/2ML IJ SOLN: 4.0000 mg | INTRAMUSCULAR | Status: DC | PRN

## 2015-06-18 MED ORDER — VECURONIUM BROMIDE 10 MG IV SOLR
INTRAVENOUS | Status: DC | PRN
  Administered 2015-06-18 (×3): 2 mg via INTRAVENOUS

## 2015-06-18 MED ORDER — LABETALOL HCL 100 MG PO TABS
100.0000 mg | ORAL_TABLET | Freq: Two times a day (BID) | ORAL | Status: DC
  Administered 2015-06-18 – 2015-06-23 (×9): 100 mg via ORAL
  Filled 2015-06-18 (×11): qty 1

## 2015-06-18 MED ORDER — AMITRIPTYLINE HCL 10 MG PO TABS
20.0000 mg | ORAL_TABLET | Freq: Every day | ORAL | Status: DC
  Administered 2015-06-18 – 2015-06-22 (×5): 20 mg via ORAL
  Filled 2015-06-18 (×7): qty 2

## 2015-06-18 MED ORDER — SENNA 8.6 MG PO TABS
1.0000 | ORAL_TABLET | Freq: Two times a day (BID) | ORAL | Status: DC
Start: 2015-06-18 — End: 2015-06-23
  Administered 2015-06-18 – 2015-06-23 (×10): 8.6 mg via ORAL
  Filled 2015-06-18 (×10): qty 1

## 2015-06-18 MED ORDER — PROPOFOL 10 MG/ML IV BOLUS
INTRAVENOUS | Status: AC
  Filled 2015-06-18: qty 20

## 2015-06-18 MED ORDER — SODIUM CHLORIDE 0.9 % IV SOLN
0.0125 ug/kg/min | INTRAVENOUS | Status: DC
  Filled 2015-06-18: qty 2000

## 2015-06-18 MED ORDER — BUPIVACAINE HCL (PF) 0.5 % IJ SOLN
INTRAMUSCULAR | Status: DC | PRN
Start: 2015-06-18 — End: 2015-06-18
  Administered 2015-06-18: 10 mL

## 2015-06-18 MED ORDER — FENTANYL CITRATE (PF) 250 MCG/5ML IJ SOLN
INTRAMUSCULAR | Status: AC
  Filled 2015-06-18: qty 5

## 2015-06-18 MED ORDER — MORPHINE SULFATE (PF) 2 MG/ML IV SOLN: 1.0000 mg | INTRAVENOUS | Status: DC | PRN

## 2015-06-18 MED ORDER — ROCURONIUM BROMIDE 50 MG/5ML IV SOLN
INTRAVENOUS | Status: AC
  Filled 2015-06-18: qty 1

## 2015-06-18 MED ORDER — BACITRACIN 50000 UNITS IM SOLR
INTRAMUSCULAR | Status: DC | PRN
  Administered 2015-06-18: 15:00:00

## 2015-06-18 MED ORDER — FENTANYL CITRATE (PF) 100 MCG/2ML IJ SOLN
INTRAMUSCULAR | Status: DC | PRN
  Administered 2015-06-18 (×2): 25 ug via INTRAVENOUS
  Administered 2015-06-18: 100 ug via INTRAVENOUS

## 2015-06-18 MED ORDER — THROMBIN 20000 UNITS EX SOLR
CUTANEOUS | Status: DC | PRN
  Administered 2015-06-18: 15:00:00 via TOPICAL

## 2015-06-18 MED ORDER — ONDANSETRON HCL 4 MG PO TABS: 4.0000 mg | ORAL_TABLET | ORAL | Status: DC | PRN

## 2015-06-18 MED ORDER — PROPOFOL 10 MG/ML IV BOLUS
INTRAVENOUS | Status: DC | PRN
  Administered 2015-06-18: 100 mg via INTRAVENOUS
  Administered 2015-06-18: 50 mg via INTRAVENOUS

## 2015-06-18 MED ORDER — ONDANSETRON HCL 4 MG/2ML IJ SOLN
INTRAMUSCULAR | Status: DC | PRN
  Administered 2015-06-18: 4 mg via INTRAVENOUS

## 2015-06-18 MED ORDER — SODIUM CHLORIDE 0.9 % IV SOLN
500.0000 mg | Freq: Two times a day (BID) | INTRAVENOUS | Status: DC
  Administered 2015-06-18 – 2015-06-20 (×4): 500 mg via INTRAVENOUS
  Filled 2015-06-18 (×5): qty 5

## 2015-06-18 MED ORDER — NEOSTIGMINE METHYLSULFATE 10 MG/10ML IV SOLN
INTRAVENOUS | Status: AC
  Filled 2015-06-18: qty 1

## 2015-06-18 MED ORDER — THROMBIN 5000 UNITS EX SOLR
CUTANEOUS | Status: DC | PRN
  Administered 2015-06-18: 15:00:00 via TOPICAL

## 2015-06-18 MED ORDER — PHENOBARBITAL 32.4 MG PO TABS
97.2000 mg | ORAL_TABLET | Freq: Every day | ORAL | Status: DC
  Administered 2015-06-18 – 2015-06-22 (×5): 97.2 mg via ORAL
  Filled 2015-06-18: qty 1
  Filled 2015-06-18 (×2): qty 3
  Filled 2015-06-18: qty 1
  Filled 2015-06-18: qty 3

## 2015-06-18 MED ORDER — GLYCOPYRROLATE 0.2 MG/ML IJ SOLN
INTRAMUSCULAR | Status: AC
  Filled 2015-06-18: qty 3

## 2015-06-18 MED ORDER — BACITRACIN ZINC 500 UNIT/GM EX OINT
TOPICAL_OINTMENT | CUTANEOUS | Status: DC | PRN
  Administered 2015-06-18: 1 via TOPICAL

## 2015-06-18 MED ORDER — LABETALOL HCL 5 MG/ML IV SOLN
10.0000 mg | INTRAVENOUS | Status: DC | PRN
  Administered 2015-06-18 (×2): 20 mg via INTRAVENOUS
  Filled 2015-06-18 (×2): qty 4

## 2015-06-18 MED ORDER — ARTIFICIAL TEARS OP OINT
TOPICAL_OINTMENT | OPHTHALMIC | Status: DC | PRN
  Administered 2015-06-18: 1 via OPHTHALMIC

## 2015-06-18 MED ORDER — HYDROCODONE-ACETAMINOPHEN 5-325 MG PO TABS
1.0000 | ORAL_TABLET | ORAL | Status: DC | PRN
  Administered 2015-06-22 – 2015-06-23 (×3): 1 via ORAL
  Filled 2015-06-18 (×3): qty 1

## 2015-06-18 MED ORDER — LACTATED RINGERS IV SOLN
INTRAVENOUS | Status: DC
  Administered 2015-06-18 (×2): via INTRAVENOUS

## 2015-06-18 MED ORDER — ARTIFICIAL TEARS OP OINT
TOPICAL_OINTMENT | OPHTHALMIC | Status: AC
  Filled 2015-06-18: qty 3.5

## 2015-06-18 MED ORDER — LIDOCAINE HCL (CARDIAC) 20 MG/ML IV SOLN
INTRAVENOUS | Status: AC
  Filled 2015-06-18: qty 5

## 2015-06-18 MED ORDER — SODIUM CHLORIDE 0.9 % IV SOLN
0.0125 ug/kg/min | INTRAVENOUS | Status: DC
  Administered 2015-06-18: 14:00:00 via INTRAVENOUS
  Administered 2015-06-18: .05 ug/kg/min via INTRAVENOUS
  Filled 2015-06-18: qty 1000

## 2015-06-18 MED ORDER — ATORVASTATIN CALCIUM 40 MG PO TABS
40.0000 mg | ORAL_TABLET | Freq: Every evening | ORAL | Status: DC
  Administered 2015-06-18 – 2015-06-22 (×5): 40 mg via ORAL
  Filled 2015-06-18 (×5): qty 1

## 2015-06-18 MED ORDER — LIDOCAINE HCL (CARDIAC) 20 MG/ML IV SOLN
INTRAVENOUS | Status: DC | PRN
  Administered 2015-06-18: 100 mg via INTRAVENOUS

## 2015-06-18 MED ORDER — SODIUM CHLORIDE 0.9 % IV SOLN
INTRAVENOUS | Status: DC
  Administered 2015-06-18: 18:00:00 via INTRAVENOUS
  Administered 2015-06-19: 1000 mL via INTRAVENOUS
  Administered 2015-06-20 – 2015-06-22 (×2): via INTRAVENOUS

## 2015-06-18 MED ORDER — ONDANSETRON HCL 4 MG/2ML IJ SOLN
INTRAMUSCULAR | Status: AC
Start: 2015-06-18 — End: 2015-06-18
  Filled 2015-06-18: qty 2

## 2015-06-18 MED ORDER — AMLODIPINE BESY-BENAZEPRIL HCL 5-10 MG PO CAPS: 1.0000 | ORAL_CAPSULE | Freq: Every day | ORAL | Status: DC

## 2015-06-18 MED ORDER — MANNITOL 25 % IV SOLN
INTRAVENOUS | Status: DC | PRN
  Administered 2015-06-18: 25 g via INTRAVENOUS

## 2015-06-18 MED ORDER — NEOSTIGMINE METHYLSULFATE 10 MG/10ML IV SOLN
INTRAVENOUS | Status: DC | PRN
  Administered 2015-06-18: 4 mg via INTRAVENOUS

## 2015-06-18 MED ORDER — STERILE WATER FOR INJECTION IJ SOLN
INTRAMUSCULAR | Status: AC
  Filled 2015-06-18: qty 10

## 2015-06-18 MED ORDER — BENAZEPRIL HCL 20 MG PO TABS
10.0000 mg | ORAL_TABLET | Freq: Every day | ORAL | Status: DC
  Administered 2015-06-18 – 2015-06-23 (×6): 10 mg via ORAL
  Filled 2015-06-18 (×6): qty 1

## 2015-06-18 MED ORDER — MIDAZOLAM HCL 2 MG/2ML IJ SOLN
INTRAMUSCULAR | Status: AC
  Filled 2015-06-18: qty 2

## 2015-06-18 MED ORDER — BISACODYL 10 MG RE SUPP: 10.0000 mg | Freq: Every day | RECTAL | Status: DC | PRN

## 2015-06-18 MED ORDER — LIDOCAINE-EPINEPHRINE 1 %-1:100000 IJ SOLN
INTRAMUSCULAR | Status: DC | PRN
  Administered 2015-06-18: 10 mL

## 2015-06-18 MED ORDER — PNEUMOCOCCAL VAC POLYVALENT 25 MCG/0.5ML IJ INJ
0.5000 mL | INJECTION | INTRAMUSCULAR | Status: DC
  Filled 2015-06-18: qty 0.5

## 2015-06-18 MED ORDER — CETYLPYRIDINIUM CHLORIDE 0.05 % MT LIQD
7.0000 mL | Freq: Two times a day (BID) | OROMUCOSAL | Status: DC
  Administered 2015-06-18 – 2015-06-23 (×10): 7 mL via OROMUCOSAL

## 2015-06-18 MED ORDER — ROCURONIUM BROMIDE 100 MG/10ML IV SOLN
INTRAVENOUS | Status: DC | PRN
  Administered 2015-06-18: 50 mg via INTRAVENOUS

## 2015-06-18 MED ORDER — GADOBENATE DIMEGLUMINE 529 MG/ML IV SOLN
10.0000 mL | Freq: Once | INTRAVENOUS | Status: AC | PRN
  Administered 2015-06-18: 10 mL via INTRAVENOUS

## 2015-06-18 MED ORDER — GLYCOPYRROLATE 0.2 MG/ML IJ SOLN
INTRAMUSCULAR | Status: DC | PRN
  Administered 2015-06-18: 0.6 mg via INTRAVENOUS

## 2015-06-18 MED ORDER — 0.9 % SODIUM CHLORIDE (POUR BTL) OPTIME
TOPICAL | Status: DC | PRN
  Administered 2015-06-18 (×3): 1000 mL

## 2015-06-18 MED ORDER — CEFAZOLIN SODIUM 1-5 GM-% IV SOLN
1.0000 g | Freq: Three times a day (TID) | INTRAVENOUS | Status: AC
  Administered 2015-06-18 – 2015-06-19 (×2): 1 g via INTRAVENOUS
  Filled 2015-06-18 (×2): qty 50

## 2015-06-18 MED ORDER — FENTANYL CITRATE (PF) 100 MCG/2ML IJ SOLN: 25.0000 ug | INTRAMUSCULAR | Status: DC | PRN

## 2015-06-18 SURGICAL SUPPLY — 100 items
BANDAGE GAUZE 4  KLING STR (GAUZE/BANDAGES/DRESSINGS) ×4 IMPLANT
BATTERY IQ STERILE (MISCELLANEOUS) ×4 IMPLANT
BENZOIN TINCTURE PRP APPL 2/3 (GAUZE/BANDAGES/DRESSINGS) IMPLANT
BLADE CLIPPER SURG (BLADE) ×4 IMPLANT
BLADE SAW GIGLI 16 STRL (MISCELLANEOUS) IMPLANT
BLADE SURG 15 STRL LF DISP TIS (BLADE) IMPLANT
BLADE SURG 15 STRL SS (BLADE)
BLADE ULTRA TIP 2M (BLADE) ×4 IMPLANT
BNDG GAUZE ELAST 4 BULKY (GAUZE/BANDAGES/DRESSINGS) ×4 IMPLANT
BRUSH SCRUB EZ 1% IODOPHOR (MISCELLANEOUS) ×4 IMPLANT
BUR ACORN 6.0 PRECISION (BURR) ×3 IMPLANT
BUR ACORN 6.0MM PRECISION (BURR) ×1
BUR ADDG 1.1 (BURR) IMPLANT
BUR ADDG 1.1MM (BURR)
BUR MATCHSTICK NEURO 3.0 LAGG (BURR) IMPLANT
BUR ROUND FLUTED 4 SOFT TCH (BURR) IMPLANT
BUR ROUND FLUTED 4MM SOFT TCH (BURR)
BUR SPIRAL ROUTER 2.3 (BUR) IMPLANT
BUR SPIRAL ROUTER 2.3MM (BUR)
CANISTER SUCT 3000ML PPV (MISCELLANEOUS) ×4 IMPLANT
CATH VENTRIC 35X38 W/TROCAR LG (CATHETERS) IMPLANT
CLIP TI MEDIUM 6 (CLIP) IMPLANT
CONT SPEC 4OZ CLIKSEAL STRL BL (MISCELLANEOUS) ×4 IMPLANT
COVER MAYO STAND STRL (DRAPES) IMPLANT
DECANTER SPIKE VIAL GLASS SM (MISCELLANEOUS) ×4 IMPLANT
DRAIN SNY WOU 7FLT (WOUND CARE) IMPLANT
DRAIN SUBARACHNOID (WOUND CARE) IMPLANT
DRAPE MICROSCOPE LEICA (MISCELLANEOUS) IMPLANT
DRAPE NEUROLOGICAL W/INCISE (DRAPES) ×4 IMPLANT
DRAPE PROXIMA HALF (DRAPES) ×4 IMPLANT
DRAPE STERI IOBAN 125X83 (DRAPES) IMPLANT
DRAPE SURG 17X23 STRL (DRAPES) IMPLANT
DRAPE WARM FLUID 44X44 (DRAPE) ×4 IMPLANT
DRSG ADAPTIC 3X8 NADH LF (GAUZE/BANDAGES/DRESSINGS) IMPLANT
DRSG TELFA 3X8 NADH (GAUZE/BANDAGES/DRESSINGS) ×4 IMPLANT
DURAMATRIX ONLAY 3X3 (Plate) ×4 IMPLANT
DURAPREP 6ML APPLICATOR 50/CS (WOUND CARE) ×4 IMPLANT
ELECT REM PT RETURN 9FT ADLT (ELECTROSURGICAL) ×4
ELECTRODE REM PT RTRN 9FT ADLT (ELECTROSURGICAL) ×2 IMPLANT
EVACUATOR 1/8 PVC DRAIN (DRAIN) IMPLANT
EVACUATOR SILICONE 100CC (DRAIN) IMPLANT
FORCEPS BIPOLAR SPETZLER 8 1.0 (NEUROSURGERY SUPPLIES) ×4 IMPLANT
GAUZE SPONGE 4X4 12PLY STRL (GAUZE/BANDAGES/DRESSINGS) ×4 IMPLANT
GAUZE SPONGE 4X4 16PLY XRAY LF (GAUZE/BANDAGES/DRESSINGS) IMPLANT
GLOVE BIOGEL PI IND STRL 7.5 (GLOVE) ×2 IMPLANT
GLOVE BIOGEL PI INDICATOR 7.5 (GLOVE) ×2
GLOVE ECLIPSE 6.5 STRL STRAW (GLOVE) ×4 IMPLANT
GLOVE ECLIPSE 7.0 STRL STRAW (GLOVE) IMPLANT
GLOVE EXAM NITRILE LRG STRL (GLOVE) IMPLANT
GLOVE EXAM NITRILE MD LF STRL (GLOVE) IMPLANT
GLOVE EXAM NITRILE XL STR (GLOVE) IMPLANT
GLOVE EXAM NITRILE XS STR PU (GLOVE) IMPLANT
GOWN STRL REUS W/ TWL LRG LVL3 (GOWN DISPOSABLE) ×4 IMPLANT
GOWN STRL REUS W/ TWL XL LVL3 (GOWN DISPOSABLE) IMPLANT
GOWN STRL REUS W/TWL 2XL LVL3 (GOWN DISPOSABLE) IMPLANT
GOWN STRL REUS W/TWL LRG LVL3 (GOWN DISPOSABLE) ×4
GOWN STRL REUS W/TWL XL LVL3 (GOWN DISPOSABLE)
HEMOSTAT POWDER KIT SURGIFOAM (HEMOSTASIS) ×4 IMPLANT
HEMOSTAT SURGICEL 2X14 (HEMOSTASIS) IMPLANT
KIT BASIN OR (CUSTOM PROCEDURE TRAY) ×4 IMPLANT
KIT DRAIN CSF ACCUDRAIN (MISCELLANEOUS) IMPLANT
KIT ROOM TURNOVER OR (KITS) ×4 IMPLANT
KNIFE ARACHNOID DISP AM-24-S (MISCELLANEOUS) ×4 IMPLANT
MARKER SPHERE PSV REFLC 13MM (MARKER) ×20 IMPLANT
NEEDLE HYPO 25X1 1.5 SAFETY (NEEDLE) ×4 IMPLANT
NEEDLE SPNL 18GX3.5 QUINCKE PK (NEEDLE) IMPLANT
NS IRRIG 1000ML POUR BTL (IV SOLUTION) ×4 IMPLANT
PACK CRANIOTOMY (CUSTOM PROCEDURE TRAY) ×4 IMPLANT
PAD EYE OVAL STERILE LF (GAUZE/BANDAGES/DRESSINGS) IMPLANT
PATTIES SURGICAL .25X.25 (GAUZE/BANDAGES/DRESSINGS) IMPLANT
PATTIES SURGICAL .5 X.5 (GAUZE/BANDAGES/DRESSINGS) IMPLANT
PATTIES SURGICAL .5 X3 (DISPOSABLE) IMPLANT
PATTIES SURGICAL 1/4 X 3 (GAUZE/BANDAGES/DRESSINGS) IMPLANT
PATTIES SURGICAL 1X1 (DISPOSABLE) IMPLANT
PIN MAYFIELD SKULL DISP (PIN) ×4 IMPLANT
PLATE 1.5  2HOLE LNG NEURO (Plate) ×8 IMPLANT
PLATE 1.5 2HOLE LNG NEURO (Plate) ×8 IMPLANT
RUBBERBAND STERILE (MISCELLANEOUS) IMPLANT
SCREW SELF DRILL HT 1.5/4MM (Screw) ×32 IMPLANT
SPECIMEN JAR SMALL (MISCELLANEOUS) IMPLANT
SPONGE NEURO XRAY DETECT 1X3 (DISPOSABLE) IMPLANT
SPONGE SURGIFOAM ABS GEL 100 (HEMOSTASIS) ×4 IMPLANT
STAPLER VISISTAT 35W (STAPLE) ×4 IMPLANT
STOCKINETTE 6  STRL (DRAPES) ×2
STOCKINETTE 6 STRL (DRAPES) ×2 IMPLANT
SUT ETHILON 3 0 FSL (SUTURE) IMPLANT
SUT ETHILON 3 0 PS 1 (SUTURE) IMPLANT
SUT NURALON 4 0 TR CR/8 (SUTURE) ×12 IMPLANT
SUT SILK 0 TIES 10X30 (SUTURE) IMPLANT
SUT VIC AB 0 CT1 18XCR BRD8 (SUTURE) ×4 IMPLANT
SUT VIC AB 0 CT1 8-18 (SUTURE) ×4
SUT VIC AB 3-0 SH 8-18 (SUTURE) ×8 IMPLANT
TIP SONASTAR STD MISONIX 1.9 (TRAY / TRAY PROCEDURE) IMPLANT
TOWEL OR 17X24 6PK STRL BLUE (TOWEL DISPOSABLE) ×4 IMPLANT
TOWEL OR 17X26 10 PK STRL BLUE (TOWEL DISPOSABLE) ×4 IMPLANT
TRAY FOLEY W/METER SILVER 14FR (SET/KITS/TRAYS/PACK) ×4 IMPLANT
TUBE CONNECTING 12'X1/4 (SUCTIONS) ×1
TUBE CONNECTING 12X1/4 (SUCTIONS) ×3 IMPLANT
UNDERPAD 30X30 INCONTINENT (UNDERPADS AND DIAPERS) ×4 IMPLANT
WATER STERILE IRR 1000ML POUR (IV SOLUTION) ×4 IMPLANT

## 2015-06-18 NOTE — Anesthesia Procedure Notes (Signed)
Procedure Name: Intubation Date/Time: 06/18/2015 12:19 PM Performed by: Willeen Cass P Pre-anesthesia Checklist: Patient identified, Timeout performed, Emergency Drugs available, Suction available and Patient being monitored Patient Re-evaluated:Patient Re-evaluated prior to inductionOxygen Delivery Method: Circle system utilized Preoxygenation: Pre-oxygenation with 100% oxygen Ventilation: Mask ventilation without difficulty and Oral airway inserted - appropriate to patient size Laryngoscope Size: Mac and 3 Grade View: Grade I Tube type: Oral Tube size: 7.0 mm Number of attempts: 1 Airway Equipment and Method: Stylet Placement Confirmation: ETT inserted through vocal cords under direct vision,  positive ETCO2 and breath sounds checked- equal and bilateral Secured at: 22 cm Tube secured with: Tape Dental Injury: Teeth and Oropharynx as per pre-operative assessment

## 2015-06-18 NOTE — H&P (Signed)
CC:  Tumor, speech difficulty  HPI: [name] is a 69 year old woman I'm seeing with a 2-1/2 cm left sided meningioma which at that time I recommended surgical resection. Unfortunately, the patient did not follow-up with me since that visit, and I did not hear from them over the phone. In the interim, the patient has had worsening in her speech. She was in the office with her sister, who state that they were waiting to hear back from [place] about scheduling the surgery. They have seen Dr. [name] as well as their primary doctor, Dr. Cindie Laroche and there has been concerned that her seizures have been worsening and these might be the cause of her worsening speech.   PMH: Past Medical History  Diagnosis Date  . Seizures ([place])   . Hypertension   . Thyroid disease   . Stroke [facility]) 2009, 2015 9/27, 11/2014  . Aphasia     PSH: Past Surgical History  Procedure Laterality Date  . Peg tube placement    . Peg tube removal    . Abdominal hysterectomy      SH: Social History  Substance Use Topics  . Smoking status: Current Every Day Smoker -- 1.50 packs/day for 50 years    Types: Cigarettes  . Smokeless tobacco: Never Used  . Alcohol Use: No    MEDS: Prior to Admission medications   Medication Sig Start Date End Date Taking? Authorizing Provider  amitriptyline (ELAVIL) 10 MG tablet Take 20 mg by mouth at bedtime.   Yes Historical Provider, MD  amLODipine-benazepril (LOTREL) 5-10 MG per capsule Take 1 capsule by mouth daily.   Yes Historical Provider, MD  aspirin EC 81 MG tablet Take 81 mg by mouth every evening.   Yes Historical Provider, MD  atorvastatin (LIPITOR) 40 MG tablet Take 40 mg by mouth every evening.    Yes Historical Provider, MD  labetalol (NORMODYNE) 100 MG tablet Take 100 mg by mouth 2 (two) times daily.   Yes Historical Provider, MD  levothyroxine (SYNTHROID, LEVOTHROID) 25 MCG tablet Take 25 mcg by mouth daily before breakfast.   Yes Historical Provider, MD  PARoxetine  (PAXIL) 10 MG tablet Take 10 mg by mouth daily.   Yes Historical Provider, MD  PHENobarbital (LUMINAL) 97.2 MG tablet Take 1 tablet (97.2 mg total) by mouth at bedtime. 11/13/14  Yes Lucia Gaskins, MD    ALLERGY: Allergies  Allergen Reactions  . Codeine     ROS: ROS  NEUROLOGIC EXAM: Awake, alert, oriented Speech dysartyhric, mixed aphasia Motor exam: Good strength on right LUE contracted  IMPRESSION: 69 year old woman with left temporoparietal 2.5cm meningioma, with surrounding edema. Her worsening clinical condition could certainly be related to the left temporoparietal tumor given its proximity to motor and receptive speech areas on the dominant hemisphere.  PLAN: stereotactic left craniotomy for resection of the symptomatic meningioma   I have reviewed the plan above with the patient and her sister. Given her worsening symptoms and the location of the tumor with edema, I did recommend resection of the tumor. Risks of the surgery were reviewed including stroke leading to weakness, paralysis, coma, death, or worsening of speech, worsening seizures, hydrocephalus, bleeding, and infection. The patient and her sister appeared to understand our discussion and are willing to proceed with resection. All questions were answered.

## 2015-06-18 NOTE — Anesthesia Preprocedure Evaluation (Addendum)
Anesthesia Evaluation  Patient identified by MRN, date of birth, ID band Patient awake    Reviewed: Allergy & Precautions, NPO status , Patient's Chart, lab work & pertinent test results, reviewed documented beta blocker date and time   History of Anesthesia Complications Negative for: history of anesthetic complications  Airway Mallampati: II  TM Distance: >3 FB Neck ROM: Full    Dental  (+) Edentulous Upper, Edentulous Lower   Pulmonary neg shortness of breath, neg sleep apnea, neg COPD, neg recent URI, Current Smoker, neg PE   breath sounds clear to auscultation       Cardiovascular hypertension, Pt. on medications and Pt. on home beta blockers  Rhythm:Regular     Neuro/Psych Seizures -, Well Controlled,  Left sided deficits  CVA, Residual Symptoms negative psych ROS   GI/Hepatic negative GI ROS, Neg liver ROS,   Endo/Other  Hypothyroidism   Renal/GU negative Renal ROS     Musculoskeletal   Abdominal   Peds  Hematology   Anesthesia Other Findings   Reproductive/Obstetrics                            Anesthesia Physical Anesthesia Plan  ASA: III  Anesthesia Plan: General   Post-op Pain Management:    Induction: Intravenous  Airway Management Planned: Oral ETT  Additional Equipment: Arterial line and CVP  Intra-op Plan:   Post-operative Plan: Extubation in OR  Informed Consent: I have reviewed the patients History and Physical, chart, labs and discussed the procedure including the risks, benefits and alternatives for the proposed anesthesia with the patient or authorized representative who has indicated his/her understanding and acceptance.     Plan Discussed with: CRNA and Surgeon  Anesthesia Plan Comments:         Anesthesia Quick Evaluation

## 2015-06-18 NOTE — Op Note (Signed)
PREOP DIAGNOSIS:  1. Left temporal meningioma   POSTOP DIAGNOSIS: Same  PROCEDURE: 1. Stereotactic left temporal craniotomy for resection of meningioma 2. Use of intraoperative microscope for microdissection  SURGEON: Dr. Consuella Lose, MD  ASSISTANT: Dr. Ashok Pall, MD  ANESTHESIA: General Endotracheal  EBL: 200cc  SPECIMENS:  1. Left temporal meningioma 2. Dural tail  DRAINS: None  COMPLICATIONS: None immediate  CONDITION: Hemodynamically stable to PACU  HISTORY: [name] is a 69 y.o. female with a history of left temporal meningioma. Over the last 6 months, the patient has had significant clinical decline including severe worsening of speech. Repeat MRI has demonstrated significant increase in size of the tumor. She therefore presents today for resection. The risks and benefits of the surgery were explained in detail to the patient and her family. After all questions were answered, informed consent was obtained.  PROCEDURE IN DETAIL: After informed consent was obtained and witnessed, the patient was brought to the operating room. After induction of general anesthesia, the patient was placed in the [place] head holder, and was positioned on the operative table in the supine position. All pressure points were meticulously padded. Utilizing the preoperative stereotactic MRI scan, surface markers were co-registered until satisfactory accuracy was achieved. The stereotactic system was then used to plan out a standard reverse question mark temporal incision which would allow access to margins of the tumor. Skin incision was then marked out and prepped and draped in the usual sterile fashion.  After timeout was conducted, skin incision was infiltrated with local anesthetic with epinephrine. Incision was then made sharply, and Bovie electrocautery was used to dissect through subcutaneous tissue, galea, and the temporalis fascia. A single piece myocutaneous flap was then  elevated and retracted anteriorly. Stereotactic system was then used to plan out a craniotomy allowing access to the entire tumor. Bur holes were then created, and a craniotomy flap was elevated. Hemostasis was secured on the dural surface. Dura was then incised in circular fashion around the tumor. At this point the microscope was draped sterilely and brought into the field, and the remainder of the case was done under the microscope using microdissection.  The arachnoid margins of the tumor were slowly dissected circumferentially around the tumor. There was a good arachnoid plane for the majority of the superficial circumference of the tumor. In the deeper margins, the plane was somewhat lost. Dissection slowly continued in the deep margin of the tumor, and the tumor was removed en bloc and sent for permanent pathology.  At this point, the dural margins were then coagulated and removed, and this dural tail was sent separately for permanent pathology. Hemostasis was achieved on the surgical bed using a combination of bipolar electrocautery and morcellized Gelfoam with thrombin. The wound is then irrigated with copious amounts of normal saline irrigation. A dural inlay graft was then placed. The bone flap was then replaced and plated with standard titanium plates and screws. The wound is again irrigated with normal saline irrigation. Temporalis muscle and fascia was closed with interrupted 0 Vicryl stitches, and the galea closed with 3-0 Vicryl stitches. Skin was then closed with skin staples, sterile dressing was applied after the [place] head holder was removed. The patient was then transferred to the stretcher, asked abated, and taken to the postanesthesia care unit in stable hemodynamic condition. At the end of the case all sponge, needle, instrument, and cottonoid counts were correct.

## 2015-06-18 NOTE — Transfer of Care (Signed)
Immediate Anesthesia Transfer of Care Note  Patient: [name]  Procedure(s) Performed: Procedure(s) with comments: CRANIOTOMY TUMOR EXCISION w/Curve (Left) - stereotactic Left Craniotomy for resection of meningioma  Patient Location: PACU  Anesthesia Type:General  Level of Consciousness: awake, alert , oriented and patient cooperative  Airway & Oxygen Therapy: Patient Spontanous Breathing and Patient connected to face mask oxygen  Post-op Assessment: Report given to RN, Post -op Vital signs reviewed and stable and Patient moving all extremities X 4  Post vital signs: Reviewed and stable  Last Vitals:  BP 145/83 HR 81 RR 22  SpO2 100% on 2L [place] Resting comfortably, maintains good airway, restless but denies pain.  Complications: No apparent anesthesia complications

## 2015-06-19 MED ORDER — ACETAMINOPHEN 325 MG PO TABS
650.0000 mg | ORAL_TABLET | Freq: Four times a day (QID) | ORAL | Status: DC | PRN
  Administered 2015-06-19 – 2015-06-21 (×3): 650 mg via ORAL
  Filled 2015-06-19 (×2): qty 2

## 2015-06-19 NOTE — Progress Notes (Signed)
Patient ID: [name], female   DOB: 12/10/1945, 69 y.o.   MRN: [id number] BP 111/75 mmHg  Pulse 86  Temp(Src) 102.5 F (39.2 C) (Oral)  Resp 20  Ht 5' (1.524 m)  Wt 48.4 kg (106 lb 11.2 oz)  BMI 20.84 kg/m2  SpO2 99% Alert and oriented x 4, speech is dysarthric, following commands Moving extremities Dressing is stained and dry Appears to be improved from preop status.

## 2015-06-20 MED ORDER — BETHANECHOL CHLORIDE 10 MG PO TABS
10.0000 mg | ORAL_TABLET | Freq: Three times a day (TID) | ORAL | Status: DC
  Filled 2015-06-20 (×3): qty 1

## 2015-06-20 MED ORDER — PANTOPRAZOLE SODIUM 40 MG PO TBEC
40.0000 mg | DELAYED_RELEASE_TABLET | Freq: Every day | ORAL | Status: DC
  Administered 2015-06-20 – 2015-06-23 (×4): 40 mg via ORAL
  Filled 2015-06-20 (×4): qty 1

## 2015-06-20 MED ORDER — LEVETIRACETAM 500 MG PO TABS
500.0000 mg | ORAL_TABLET | Freq: Two times a day (BID) | ORAL | Status: DC
  Administered 2015-06-20 – 2015-06-23 (×6): 500 mg via ORAL
  Filled 2015-06-20 (×6): qty 1

## 2015-06-20 NOTE — Evaluation (Signed)
Clinical/Bedside Swallow Evaluation Patient Details  Name: [name] MRN: [id number] Date of Birth: 1945-11-01  Today's Date: 06/20/2015 Time: SLP Start Time (ACUTE ONLY): 1012 SLP Stop Time (ACUTE ONLY): 1026 SLP Time Calculation (min) (ACUTE ONLY): 14 min  Past Medical History:  Past Medical History  Diagnosis Date  . Seizures ([place])   . Hypertension   . Thyroid disease   . Stroke [facility]) 2009, 2015 9/27, 11/2014  . Aphasia    Past Surgical History:  Past Surgical History  Procedure Laterality Date  . Peg tube placement    . Peg tube removal    . Abdominal hysterectomy     HPI:  69 year old female s/p craniotomy for resection of left tempoparietal meningioma. PMH: multiple CVAs, aphasia, seizures, hypertension, thyroid disease.   Assessment / Plan / Recommendation Clinical Impression  Pt's oral phase is discoordinated, with decreased labial seal and lingual movement. Her dentures are poorly fitting, and she has difficulty getting solids, including pills, beyond her dentition. Pt removed dentures which facilitated with oral acceptance and initiation of posterior transit, although she continues to have diffidculty with complete oral clearance. Recommend Dys 1 diet and thin liquids, and would try meals without dentures in place.    Aspiration Risk  Mild aspiration risk    Diet Recommendation  Dys 1 diet (puree), thin liquids Liquids via cup or straw   Medication Administration: Whole meds with liquid    Other  Recommendations Oral Care Recommendations: Oral care BID   Follow up Recommendations  Other (comment) (tba)    Frequency and Duration min 2x/week  2 weeks       Swallow Study   General HPI: 69 year old female s/p craniotomy for resection of left tempoparietal meningioma. PMH: multiple CVAs, aphasia, seizures, hypertension, thyroid disease. Type of Study: Bedside Swallow Evaluation Previous Swallow Assessment: previous BSEs recommending soft solid diets and  thin liquids, noted discoordination and poorly fitting dentures Diet Prior to this Study: Thin liquids (full liquid) Temperature Spikes Noted: Yes (102.5) Respiratory Status: Room air History of Recent Intubation: Yes Length of Intubations (days):  (for surgery) Date extubated: 06/18/15 Behavior/Cognition: Alert;Cooperative;Pleasant mood;Other (Comment) (very HOH) Oral Cavity Assessment: Within Functional Limits Oral Care Completed by SLP: No Oral Cavity - Dentition: Dentures, top;Dentures, bottom Vision: Functional for self-feeding Patient Positioning: Upright in bed Baseline Vocal Quality: Normal    Oral/Motor/Sensory Function Overall Oral Motor/Sensory Function: Other (comment) (likely baseline)   Ice Chips Ice chips: Not tested   Thin Liquid Thin Liquid: Within functional limits Presentation: Straw    Nectar Thick Nectar Thick Liquid: Not tested   Honey Thick Honey Thick Liquid: Not tested   Puree Puree: Impaired Presentation: Spoon Oral Phase Impairments: Reduced labial seal;Reduced lingual movement/coordination;Poor awareness of bolus Oral Phase Functional Implications: Right anterior spillage;Left anterior spillage;Prolonged oral transit;Oral residue   Solid Solid: Impaired Oral Phase Impairments: Reduced labial seal;Reduced lingual movement/coordination;Impaired mastication Oral Phase Functional Implications: Right anterior spillage;Left anterior spillage;Prolonged oral transit;Impaired mastication;Oral residue      lumbar spine  [name] 06/20/2015,10:42 AM

## 2015-06-20 NOTE — Progress Notes (Signed)
Report called and given to [place], Therapist, sports.

## 2015-06-20 NOTE — Progress Notes (Signed)
Patient ID: [name], female   DOB: 11/19/1945, 69 y.o.   MRN: [id number] BP 144/86 mmHg  Pulse 83  Temp(Src) 100.6 F (38.1 C) (Oral)  Resp 13  Ht 5' (1.524 m)  Wt 48.4 kg (106 lb 11.2 oz)  BMI 20.84 kg/m2  SpO2 100% Alert and oriented x 4, following all commands Speech is improved, remains dysarthric but less than yesterday Dressing is dry Moving extremities Will transfer to floor.

## 2015-06-21 ENCOUNTER — Encounter (HOSPITAL_COMMUNITY): Payer: Self-pay | Admitting: Neurosurgery

## 2015-06-21 NOTE — Care Management Important Message (Signed)
Important Message  Patient Details  Name: [name] MRN: [id number] Date of Birth: 09-23-1945   Medicare Important Message Given:  Yes    [name] P Farida Mcreynolds 06/21/2015, 2:55 PM

## 2015-06-21 NOTE — Progress Notes (Signed)
No issues overnight. Pt reports minimal HA, improved speech.  EXAM:  BP 125/78 mmHg  Pulse 71  Temp(Src) 98.2 F (36.8 C) (Oral)  Resp 16  Ht 5' (1.524 m)  Wt 48.4 kg (106 lb 11.2 oz)  BMI 20.84 kg/m2  SpO2 100%  Awake, alert, oriented  Speech fluent, dysarthria is improved from preop Good strength RUE/RLE Wound c/d/i  IMPRESSION:  69 y.o. female POD# 3 s/p resection left temporal meningioma, doing well.  PLAN: - Up with assistance - Cont keppra/steroids - Cont SLP therapy - Likely home tomorrow

## 2015-06-21 NOTE — Progress Notes (Signed)
Patient temp 101.1(oral)  Attempted use of incentive spirometer with teach back patient does not follow comands well. Prn tylenol administered call to surgeon on call to make aware. No new orders will continue to monitor.

## 2015-06-21 NOTE — Progress Notes (Signed)
Patient noted not follow command and have been asking for her $10. She found it in her purse and is keeping with her in her pocket. RN and other staffs (Delphina, RN) advise to have $10 lock in security but pt refused. Risks of benefits explained of personal item at bedside.   Ave Filter, RN

## 2015-06-21 NOTE — Care Management Note (Signed)
Case Management Note  Patient Details  Name: [name] MRN: [id number] Date of Birth: 28-May-1946  Subjective/Objective:                    Action/Plan: Patient was admitted with meningioma. Underwent a left temporal meningioma resection. Lives at home with spouse. Will follow for discharge needs pending PT/OT evals and physician orders. Expected Discharge Date:                  Expected Discharge Plan:     In-House Referral:     Discharge planning Services     Post Acute Care Choice:    Choice offered to:     DME Arranged:    DME Agency:     HH Arranged:    HH Agency:     Status of Service:  In process, will continue to follow  Medicare Important Message Given:    Date Medicare IM Given:    Medicare IM give by:    Date Additional Medicare IM Given:    Additional Medicare Important Message give by:     If discussed at [place] of Stay Meetings, dates discussed:    Additional CommentsRolm Baptise, RN 06/21/2015, 10:06 AM [phone]

## 2015-06-21 NOTE — Progress Notes (Signed)
   06/21/15 0917  Mechanical VTE Prophylaxis (All Areas)  Mechanical VTE Prophylaxis Sequential compression devices, below knee;Bilateral lower extremities  Mechanical VTE Prophylaxis Intervention SCD's on, removed for 30 minutes  Pressure Ulcer Prevention  Repositioned Sitting  Positioning Frequency Able to turn self  Hygiene  Hygiene Peri care  Level of Assistance Minimal assist  Mobility  Activity Chair;Ambulate in room;Bathroom privileges  Level of Assistance Minimal assist, patient does 75% or more  Assistive Device None (stand by assist)  Ambulation Response Tolerated well  Bed Position Chair  Range of Motion Active;All extremities   Pt ambulate in room with minimal assist, pt is unsteady and requires full supervision with ambulation. Pt denied pain at this time

## 2015-06-22 MED ORDER — WHITE PETROLATUM GEL
Status: AC
  Administered 2015-06-22: 22:00:00
  Filled 2015-06-22: qty 1

## 2015-06-22 NOTE — Progress Notes (Signed)
No issues overnight. Pt has no complaints currently.  EXAM:  BP 119/72 mmHg  Pulse 70  Temp(Src) 99.1 F (37.3 C) (Oral)  Resp 18  Ht 5' (1.524 m)  Wt 48.4 kg (106 lb 11.2 oz)  BMI 20.84 kg/m2  SpO2 100%  Awake, alert, oriented  Speech dysarthric, but improving Good RUE/RLE strength Wound c/d/i  IMPRESSION:  69 y.o. female POD# 4 s/p resection left temporal meningioma, doing well. Improved from baseline  PLAN: - Will plan on d/c home with caretaker tomorrow - outpatient SLP  I reviewed the patient's condition and discharge plan back home with her caretaker, Mrs. Javier Glazier yesterday.

## 2015-06-22 NOTE — Anesthesia Postprocedure Evaluation (Signed)
  Anesthesia Post-op Note  Patient: [name]  Procedure(s) Performed: Procedure(s) with comments: CRANIOTOMY TUMOR EXCISION w/Curve (Left) - stereotactic Left Craniotomy for resection of meningioma  Patient Location: PACU  Anesthesia Type:General  Level of Consciousness: awake  Airway and Oxygen Therapy: Patient Spontanous Breathing and Patient connected to nasal cannula oxygen  Post-op Pain: mild  Post-op Assessment: Post-op Vital signs reviewed, Patient's Cardiovascular Status Stable, Respiratory Function Stable, Patent Airway, No signs of Nausea or vomiting and Pain level controlled LLE Motor Response: Responds to commands   RLE Motor Response: Purposeful movement        Post-op Vital Signs: Reviewed and stable  Last Vitals:  Filed Vitals:   06/22/15 0830  BP: 148/81  Pulse: 70  Temp: 37.6 C  Resp: 18    Complications: No apparent anesthesia complications

## 2015-06-23 MED ORDER — HYDROCODONE-ACETAMINOPHEN 5-325 MG PO TABS: 1.0000 | ORAL_TABLET | ORAL | Status: DC | PRN

## 2015-06-23 NOTE — Discharge Summary (Signed)
  Physician Discharge Summary  Patient ID: [name] MRN: [id number] DOB/AGE: 09-19-1945 69 y.o.  Admit date: 06/18/2015 Discharge date: 06/23/2015  Admission Diagnoses: Meningioma  Discharge Diagnoses: Same Active Problems:   Meningioma [facility])   Discharged Condition: Stable  Hospital Course:  Mrs. [name] is a 69 y.o. female electively admitted after uncomplicated resection of left temporal meningioma. She was at baseline postop, and noted imrpovement in her dysarthria during her hospital course. She was ambulating, tolerating diet, voiding normally, with pain under control. She lives with a caretaker and was discharged in stable condition.  Treatments: Surgery - left temporal craniotomy for resection of meningioma  Discharge Exam: Blood pressure 140/77, pulse 65, temperature 99.7 F (37.6 C), temperature source Oral, resp. rate 18, height 5' (1.524 m), weight 48.4 kg (106 lb 11.2 oz), SpO2 100 %. Awake, alert, oriented Speech fluent, dysarthric Good RUE/RLE strength Wound c/d/i  Disposition: 01-Home or Self Care     Medication List    TAKE these medications        amitriptyline 10 MG tablet  Commonly known as:  ELAVIL  Take 20 mg by mouth at bedtime.     amLODipine-benazepril 5-10 MG capsule  Commonly known as:  LOTREL  Take 1 capsule by mouth daily.     aspirin EC 81 MG tablet  Take 81 mg by mouth every evening.     atorvastatin 40 MG tablet  Commonly known as:  LIPITOR  Take 40 mg by mouth every evening.     HYDROcodone-acetaminophen 5-325 MG tablet  Commonly known as:  NORCO/VICODIN  Take 1 tablet by mouth every 4 (four) hours as needed for moderate pain.     labetalol 100 MG tablet  Commonly known as:  NORMODYNE  Take 100 mg by mouth 2 (two) times daily.     levothyroxine 25 MCG tablet  Commonly known as:  SYNTHROID, LEVOTHROID  Take 25 mcg by mouth daily before breakfast.     PARoxetine 10 MG tablet  Commonly known as:  PAXIL   Take 10 mg by mouth daily.     PHENobarbital 97.2 MG tablet  Commonly known as:  LUMINAL  Take 1 tablet (97.2 mg total) by mouth at bedtime.           Follow-up Information    Follow up with [facility], Shonta Phillis, C, MD In 2 weeks.   Specialty:  Neurosurgery   Contact information:   [address] [place] [address] [phone]       Signed: Consuella Lose, Loletha Grayer 06/23/2015, 9:33 AM

## 2015-06-23 NOTE — Progress Notes (Signed)
D/C orders received, pt for D/C home today home health,  IV D/C'd.  Rx and D/C instructions given with verbalized understanding.  Family at bedside to assist with D/C.  Staff brought pt downstairs via wheelchair.

## 2016-01-19 ENCOUNTER — Inpatient Hospital Stay (HOSPITAL_COMMUNITY): Payer: Medicare Other

## 2016-01-19 ENCOUNTER — Emergency Department (HOSPITAL_COMMUNITY): Payer: Medicare Other

## 2016-01-19 ENCOUNTER — Encounter (HOSPITAL_COMMUNITY): Payer: Self-pay | Admitting: *Deleted

## 2016-01-19 ENCOUNTER — Inpatient Hospital Stay (HOSPITAL_COMMUNITY)
Admission: EM | Admit: 2016-01-19 | Discharge: 2016-01-21 | DRG: 092 | Disposition: A | Discharge status: Home Health | Payer: Medicare Other | Attending: Internal Medicine | Admitting: Internal Medicine

## 2016-01-19 DIAGNOSIS — Z86011 Personal history of benign neoplasm of the brain: Secondary | ICD-10-CM

## 2016-01-19 DIAGNOSIS — R479 Unspecified speech disturbances: Secondary | ICD-10-CM | POA: Diagnosis not present

## 2016-01-19 DIAGNOSIS — I10 Essential (primary) hypertension: Secondary | ICD-10-CM | POA: Diagnosis present

## 2016-01-19 DIAGNOSIS — R4701 Aphasia: Principal | ICD-10-CM | POA: Diagnosis present

## 2016-01-19 DIAGNOSIS — Z79899 Other long term (current) drug therapy: Secondary | ICD-10-CM

## 2016-01-19 DIAGNOSIS — Z8673 Personal history of transient ischemic attack (TIA), and cerebral infarction without residual deficits: Secondary | ICD-10-CM | POA: Diagnosis not present

## 2016-01-19 DIAGNOSIS — E871 Hypo-osmolality and hyponatremia: Secondary | ICD-10-CM | POA: Diagnosis not present

## 2016-01-19 DIAGNOSIS — F329 Major depressive disorder, single episode, unspecified: Secondary | ICD-10-CM | POA: Diagnosis not present

## 2016-01-19 DIAGNOSIS — Z8249 Family history of ischemic heart disease and other diseases of the circulatory system: Secondary | ICD-10-CM | POA: Diagnosis not present

## 2016-01-19 DIAGNOSIS — Z823 Family history of stroke: Secondary | ICD-10-CM

## 2016-01-19 DIAGNOSIS — R531 Weakness: Secondary | ICD-10-CM

## 2016-01-19 DIAGNOSIS — R414 Neurologic neglect syndrome: Secondary | ICD-10-CM | POA: Diagnosis present

## 2016-01-19 DIAGNOSIS — Z90710 Acquired absence of both cervix and uterus: Secondary | ICD-10-CM

## 2016-01-19 DIAGNOSIS — F17210 Nicotine dependence, cigarettes, uncomplicated: Secondary | ICD-10-CM | POA: Diagnosis not present

## 2016-01-19 DIAGNOSIS — E039 Hypothyroidism, unspecified: Secondary | ICD-10-CM | POA: Diagnosis present

## 2016-01-19 DIAGNOSIS — R4182 Altered mental status, unspecified: Secondary | ICD-10-CM | POA: Diagnosis present

## 2016-01-19 DIAGNOSIS — Z7982 Long term (current) use of aspirin: Secondary | ICD-10-CM

## 2016-01-19 DIAGNOSIS — G9340 Encephalopathy, unspecified: Secondary | ICD-10-CM | POA: Diagnosis present

## 2016-01-19 DIAGNOSIS — G40909 Epilepsy, unspecified, not intractable, without status epilepticus: Secondary | ICD-10-CM | POA: Diagnosis present

## 2016-01-19 DIAGNOSIS — Z885 Allergy status to narcotic agent status: Secondary | ICD-10-CM | POA: Diagnosis not present

## 2016-01-19 DIAGNOSIS — F32A Depression, unspecified: Secondary | ICD-10-CM | POA: Diagnosis present

## 2016-01-19 DIAGNOSIS — E038 Other specified hypothyroidism: Secondary | ICD-10-CM

## 2016-01-19 DIAGNOSIS — Z720 Tobacco use: Secondary | ICD-10-CM | POA: Diagnosis present

## 2016-01-19 LAB — CBC
HEMATOCRIT: 36.9 % (ref 36.0–46.0)
HEMOGLOBIN: 12.8 g/dL (ref 12.0–15.0)
MCH: 31.1 pg (ref 26.0–34.0)
MCHC: 34.7 g/dL (ref 30.0–36.0)
MCV: 89.6 fL (ref 78.0–100.0)
Platelets: 271 10*3/uL (ref 150–400)
RBC: 4.12 MIL/uL (ref 3.87–5.11)
RDW: 14.3 % (ref 11.5–15.5)
WBC: 8.8 10*3/uL (ref 4.0–10.5)

## 2016-01-19 LAB — DIFFERENTIAL
BASOS ABS: 0 10*3/uL (ref 0.0–0.1)
BASOS PCT: 0 %
EOS ABS: 0.1 10*3/uL (ref 0.0–0.7)
EOS PCT: 1 %
LYMPHS ABS: 3.1 10*3/uL (ref 0.7–4.0)
Lymphocytes Relative: 36 %
MONO ABS: 0.9 10*3/uL (ref 0.1–1.0)
MONOS PCT: 11 %
Neutro Abs: 4.6 10*3/uL (ref 1.7–7.7)
Neutrophils Relative %: 52 %

## 2016-01-19 LAB — RAPID URINE DRUG SCREEN, HOSP PERFORMED
Amphetamines: NOT DETECTED
BARBITURATES: POSITIVE — AB
BENZODIAZEPINES: NOT DETECTED
COCAINE: NOT DETECTED
Opiates: NOT DETECTED
Tetrahydrocannabinol: NOT DETECTED

## 2016-01-19 LAB — URINALYSIS, ROUTINE W REFLEX MICROSCOPIC
BILIRUBIN URINE: NEGATIVE
GLUCOSE, UA: NEGATIVE mg/dL
Ketones, ur: NEGATIVE mg/dL
LEUKOCYTES UA: NEGATIVE
Nitrite: NEGATIVE
Protein, ur: NEGATIVE mg/dL
SPECIFIC GRAVITY, URINE: 1.015 (ref 1.005–1.030)
pH: 7.5 (ref 5.0–8.0)

## 2016-01-19 LAB — COMPREHENSIVE METABOLIC PANEL
ALT: 13 U/L — ABNORMAL LOW (ref 14–54)
AST: 14 U/L — ABNORMAL LOW (ref 15–41)
Albumin: 4.4 g/dL (ref 3.5–5.0)
Alkaline Phosphatase: 62 U/L (ref 38–126)
Anion gap: 7 (ref 5–15)
BILIRUBIN TOTAL: 0.3 mg/dL (ref 0.3–1.2)
BUN: 8 mg/dL (ref 6–20)
CHLORIDE: 98 mmol/L — AB (ref 101–111)
CO2: 27 mmol/L (ref 22–32)
Calcium: 9 mg/dL (ref 8.9–10.3)
Creatinine, Ser: 0.54 mg/dL (ref 0.44–1.00)
Glucose, Bld: 130 mg/dL — ABNORMAL HIGH (ref 65–99)
POTASSIUM: 3.8 mmol/L (ref 3.5–5.1)
Sodium: 132 mmol/L — ABNORMAL LOW (ref 135–145)
TOTAL PROTEIN: 7.7 g/dL (ref 6.5–8.1)

## 2016-01-19 LAB — I-STAT TROPONIN, ED: TROPONIN I, POC: 0 ng/mL (ref 0.00–0.08)

## 2016-01-19 LAB — I-STAT CHEM 8, ED
BUN: 6 mg/dL (ref 6–20)
Calcium, Ion: 1.11 mmol/L — ABNORMAL LOW (ref 1.13–1.30)
Chloride: 96 mmol/L — ABNORMAL LOW (ref 101–111)
Creatinine, Ser: 0.6 mg/dL (ref 0.44–1.00)
GLUCOSE: 128 mg/dL — AB (ref 65–99)
HCT: 40 % (ref 36.0–46.0)
HEMOGLOBIN: 13.6 g/dL (ref 12.0–15.0)
POTASSIUM: 3.9 mmol/L (ref 3.5–5.1)
Sodium: 135 mmol/L (ref 135–145)
TCO2: 25 mmol/L (ref 0–100)

## 2016-01-19 LAB — URINE MICROSCOPIC-ADD ON

## 2016-01-19 LAB — ETHANOL

## 2016-01-19 LAB — PHENOBARBITAL LEVEL: Phenobarbital: 14.7 ug/mL — ABNORMAL LOW (ref 15.0–40.0)

## 2016-01-19 LAB — CBG MONITORING, ED: GLUCOSE-CAPILLARY: 140 mg/dL — AB (ref 65–99)

## 2016-01-19 LAB — PROTIME-INR
INR: 0.96 (ref 0.00–1.49)
Prothrombin Time: 13 seconds (ref 11.6–15.2)

## 2016-01-19 LAB — APTT: APTT: 27 s (ref 24–37)

## 2016-01-19 LAB — TSH: TSH: 0.798 u[IU]/mL (ref 0.350–4.500)

## 2016-01-19 MED ORDER — ASPIRIN 325 MG PO TABS
325.0000 mg | ORAL_TABLET | Freq: Every day | ORAL | Status: DC
  Administered 2016-01-20 – 2016-01-21 (×2): 325 mg via ORAL
  Filled 2016-01-19 (×2): qty 1

## 2016-01-19 MED ORDER — LEVETIRACETAM 500 MG/5ML IV SOLN
1000.0000 mg | Freq: Once | INTRAVENOUS | Status: AC
  Administered 2016-01-19: 1000 mg via INTRAVENOUS
  Filled 2016-01-19 (×2): qty 10

## 2016-01-19 MED ORDER — ASPIRIN EC 81 MG PO TBEC: 81.0000 mg | DELAYED_RELEASE_TABLET | Freq: Every evening | ORAL | Status: DC

## 2016-01-19 MED ORDER — ATORVASTATIN CALCIUM 40 MG PO TABS
40.0000 mg | ORAL_TABLET | Freq: Every evening | ORAL | Status: DC
  Administered 2016-01-20: 40 mg via ORAL
  Filled 2016-01-19 (×3): qty 1

## 2016-01-19 MED ORDER — LEVOTHYROXINE SODIUM 25 MCG PO TABS
25.0000 ug | ORAL_TABLET | Freq: Every day | ORAL | Status: DC
  Administered 2016-01-20 – 2016-01-21 (×2): 25 ug via ORAL
  Filled 2016-01-19 (×2): qty 1

## 2016-01-19 MED ORDER — PHENOBARBITAL 32.4 MG PO TABS
97.2000 mg | ORAL_TABLET | Freq: Every day | ORAL | Status: DC
  Administered 2016-01-20: 97.2 mg via ORAL
  Filled 2016-01-19: qty 3

## 2016-01-19 MED ORDER — HYDRALAZINE HCL 20 MG/ML IJ SOLN: 10.0000 mg | INTRAMUSCULAR | Status: DC | PRN

## 2016-01-19 MED ORDER — AMITRIPTYLINE HCL 10 MG PO TABS
20.0000 mg | ORAL_TABLET | Freq: Every day | ORAL | Status: DC
  Administered 2016-01-20: 20 mg via ORAL
  Filled 2016-01-19 (×3): qty 2

## 2016-01-19 MED ORDER — LORAZEPAM 2 MG/ML IJ SOLN
1.0000 mg | Freq: Once | INTRAMUSCULAR | Status: AC
  Administered 2016-01-19: 1 mg via INTRAVENOUS

## 2016-01-19 MED ORDER — LEVETIRACETAM 500 MG/5ML IV SOLN
INTRAVENOUS | Status: AC
  Filled 2016-01-19: qty 10

## 2016-01-19 MED ORDER — ASPIRIN 300 MG RE SUPP
300.0000 mg | Freq: Every day | RECTAL | Status: DC
  Administered 2016-01-19: 300 mg via RECTAL
  Filled 2016-01-19: qty 1

## 2016-01-19 MED ORDER — SODIUM CHLORIDE 0.9 % IV SOLN
INTRAVENOUS | Status: AC
  Administered 2016-01-19: 22:00:00 via INTRAVENOUS

## 2016-01-19 MED ORDER — SENNOSIDES-DOCUSATE SODIUM 8.6-50 MG PO TABS
1.0000 | ORAL_TABLET | Freq: Every evening | ORAL | Status: DC | PRN
  Filled 2016-01-19: qty 1

## 2016-01-19 MED ORDER — ENOXAPARIN SODIUM 40 MG/0.4ML ~~LOC~~ SOLN
40.0000 mg | SUBCUTANEOUS | Status: DC
  Administered 2016-01-19 – 2016-01-20 (×2): 40 mg via SUBCUTANEOUS
  Filled 2016-01-19 (×2): qty 0.4

## 2016-01-19 MED ORDER — PAROXETINE HCL 10 MG PO TABS
10.0000 mg | ORAL_TABLET | Freq: Every day | ORAL | Status: DC
  Administered 2016-01-20 – 2016-01-21 (×2): 10 mg via ORAL
  Filled 2016-01-19 (×4): qty 1

## 2016-01-19 MED ORDER — LEVETIRACETAM IN NACL 1000 MG/100ML IV SOLN
1000.0000 mg | Freq: Once | INTRAVENOUS | Status: DC
  Filled 2016-01-19 (×3): qty 100

## 2016-01-19 MED ORDER — LORAZEPAM 2 MG/ML IJ SOLN
1.0000 mg | Freq: Once | INTRAMUSCULAR | Status: AC
  Administered 2016-01-19: 1 mg via INTRAVENOUS
  Filled 2016-01-19: qty 1

## 2016-01-19 MED ORDER — LORAZEPAM 2 MG/ML IJ SOLN
INTRAMUSCULAR | Status: AC
  Filled 2016-01-19: qty 1

## 2016-01-19 MED ORDER — STROKE: EARLY STAGES OF RECOVERY BOOK
Freq: Once | Status: AC
  Administered 2016-01-20: 11:00:00
  Filled 2016-01-19 (×2): qty 1

## 2016-01-19 NOTE — Progress Notes (Signed)
[place] EXAM STARTED PT ON TABLE 1930 EXAM FINISHED 1936 1937 NT IMAGES TO [place] AND CALLED [place]

## 2016-01-19 NOTE — ED Notes (Signed)
TTS complete at this time

## 2016-01-19 NOTE — H&P (Addendum)
History and Physical    [name] [id number] DOB: 07-Sep-1945 DOA: 01/19/2016  PCP: Maricela Curet, MD   Patient coming from: Home   Chief Complaint: Slurred speech, confusion   HPI: [name] is a 70 y.o. 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 Patient was reportedly in her usual state of health throughout the day until approximately 5:30 PM on the day of her admission when she was noted by family to have slurred speech and confusion. Patient is disoriented at time of exam and unable to contribute to the history, which is therefore obtained through discussion with the ED personnel, patient's family, and review of the medical records. Patient had not made any complaints to her family members in recent days and seemed to be in her usual state. There is been no known trauma or loss of consciousness and her family highly doubts use of alcohol or illicit substances. Family reports that she had similar symptoms previously when she was diagnosed with a stroke, and again following resection of meningioma November 2016. Speech disturbance that was present following meningioma resection and ultimately resolved spontaneously and she had been conversing appropriately with her family members this evening. After approximately 5:30 PM, her speech was noted to be slow and she was obviously confused. She was brought into the emergency department for evaluation of this.  ED Course: Upon arrival to the ED, patient is found to be saturating well on room air, hypertensive to the 170/110 range, and with vitals otherwise stable. CMP is notable for mild hyponatremia and hypochloremia and CBC is unremarkable. INR is normal and troponin is undetectable. Phenobarbital  level was subtherapeutic at 14.7 and ethanol level was less than 5. CT of the head was obtained and negative for acute intracranial abnormalities, though prior strokes are noted. Code stroke was activated from the ED and the patient was evaluated by the teleneurologist. Neurology felt that the presentation was more likely a result of seizure rather than CVA and no TPA was given. He was advised that she be loaded with Keppra and admitted to [facility] for EEG monitoring. 1 g of Keppra was administered intravenously in the emergency department, as well as 1 mg IV Ativan 2. Patient remains hypertensive in the ED, but otherwise stable. She has not demonstrated any significant improvement in her presenting symptoms. She'll be admitted to the stepdown unit at [facility] for ongoing evaluation and management of speech disturbance, confusion, and right-sided neglect concerning for seizure versus CVA.  Review of Systems:  All other systems reviewed and apart from HPI, are negative.  Past Medical History  Diagnosis Date  . Seizures ([place])   . Hypertension   . Thyroid disease   . Stroke [facility]) 2009, 2015 9/27, 11/2014  . Aphasia     Past Surgical History  Procedure Laterality Date  . Peg tube placement    . Peg tube removal    . Abdominal hysterectomy    . Craniotomy Left 06/18/2015    Procedure: CRANIOTOMY TUMOR EXCISION w/Curve;  Surgeon: Consuella Lose, MD;  Location: [facility] NEURO ORS;  Service: Neurosurgery;  Laterality: Left;  stereotactic Left Craniotomy for resection of meningioma     reports that she has been smoking Cigarettes.  She has a 75 pack-year smoking history. She has never used smokeless  tobacco. She reports that she does not drink alcohol or use illicit drugs.  Allergies  Allergen Reactions  . Codeine Nausea And Vomiting and Rash    Family History  Problem Relation Age of Onset  . Coronary artery disease      Stents   . Stroke Father     Hemorrhagic   .  Sudden death Father   . Ulcers Mother     Bleeding     Prior to Admission medications   Medication Sig Start Date End Date Taking? Authorizing Provider  amitriptyline (ELAVIL) 10 MG tablet Take 20 mg by mouth at bedtime.   Yes Historical Provider, MD  amLODipine-benazepril (LOTREL) 5-10 MG per capsule Take 1 capsule by mouth daily.   Yes Historical Provider, MD  aspirin EC 81 MG tablet Take 81 mg by mouth every evening.   Yes Historical Provider, MD  atorvastatin (LIPITOR) 40 MG tablet Take 40 mg by mouth every evening.    Yes Historical Provider, MD  labetalol (NORMODYNE) 100 MG tablet Take 100 mg by mouth 2 (two) times daily.   Yes Historical Provider, MD  levothyroxine (SYNTHROID, LEVOTHROID) 25 MCG tablet Take 25 mcg by mouth daily before breakfast.   Yes Historical Provider, MD  PARoxetine (PAXIL) 10 MG tablet Take 10 mg by mouth daily.   Yes Historical Provider, MD  PHENobarbital (LUMINAL) 97.2 MG tablet Take 1 tablet (97.2 mg total) by mouth at bedtime. 11/13/14  Yes Lucia Gaskins, MD  HYDROcodone-acetaminophen (NORCO/VICODIN) 5-325 MG tablet Take 1 tablet by mouth every 4 (four) hours as needed for moderate pain. Patient not taking: Reported on 01/19/2016 06/23/15   Consuella Lose, MD    Physical Exam: Filed Vitals:   01/19/16 2045 01/19/16 2100 01/19/16 2115 01/19/16 2130  BP: 163/85 148/88 142/91 149/88  Pulse:      Resp: 21 26 21 22   SpO2:          Constitutional: No apparent respiratory distress, cachectic  Eyes: PERTLA, lids and conjunctivae normal ENMT: Mucous membranes are moist. Posterior pharynx clear of any exudate or lesions.   Neck: normal, supple, no masses, no thyromegaly Respiratory: clear to auscultation bilaterally, no wheezing, no crackles. Normal respiratory effort.    Cardiovascular: S1 & S2 heard, regular rate and rhythm, no significant murmurs / rubs / gallops. No extremity edema. 2+ pedal pulses.   Abdomen: No distension, no tenderness, no masses  palpated. Bowel sounds normal.  Musculoskeletal: no clubbing / cyanosis. LUE contractures. No other gross deformity.  Skin: no significant rashes, lesions, ulcers. Warm, dry, well-perfused. Neurologic: Left facial droop, LUE contracted, slurred speech, right-sided neglect. PERRL, moving all extrems, patellar DTR's 2+ b/l. Babinski down-going bilaterally.   Psychiatric: Alert, but disoriented. Difficult to assess given clinical scenario.     Labs on Admission: I have personally reviewed following labs and imaging studies  CBC:  Recent Labs Lab 01/19/16 1920 01/19/16 1932  WBC 8.8  --   NEUTROABS 4.6  --   HGB 12.8 13.6  HCT 36.9 40.0  MCV 89.6  --   PLT 271  --    Basic Metabolic Panel:  Recent Labs Lab 01/19/16 1920 01/19/16 1932  NA 132* 135  K 3.8 3.9  CL 98* 96*  CO2 27  --   GLUCOSE 130* 128*  BUN 8 6  CREATININE 0.54 0.60  CALCIUM 9.0  --    GFR: CrCl cannot be calculated (Unknown ideal weight.). Liver Function Tests:  Recent Labs Lab 01/19/16 1920  AST  14*  ALT 13*  ALKPHOS 62  BILITOT 0.3  PROT 7.7  ALBUMIN 4.4   No results for input(s): LIPASE, AMYLASE in the last 168 hours. No results for input(s): AMMONIA in the last 168 hours. Coagulation Profile:  Recent Labs Lab 01/19/16 1920  INR 0.96   Cardiac Enzymes: No results for input(s): CKTOTAL, CKMB, CKMBINDEX, TROPONINI in the last 168 hours. BNP (last 3 results) No results for input(s): PROBNP in the last 8760 hours. HbA1C: No results for input(s): HGBA1C in the last 72 hours. CBG:  Recent Labs Lab 01/19/16 1924  GLUCAP 140*   Lipid Profile: No results for input(s): CHOL, HDL, LDLCALC, TRIG, CHOLHDL, LDLDIRECT in the last 72 hours. Thyroid Function Tests: No results for input(s): TSH, T4TOTAL, FREET4, T3FREE, THYROIDAB in the last 72 hours. Anemia Panel: No results for input(s): VITAMINB12, FOLATE, FERRITIN, TIBC, IRON, RETICCTPCT in the last 72 hours. Urine analysis:      Component Value Date/Time   COLORURINE YELLOW 11/10/2014 2212   APPEARANCEUR CLEAR 11/10/2014 2212   LABSPEC 1.015 11/10/2014 2212   PHURINE 5.5 11/10/2014 2212   GLUCOSEU NEGATIVE 11/10/2014 2212   HGBUR SMALL* 11/10/2014 2212   BILIRUBINUR NEGATIVE 11/10/2014 2212   KETONESUR NEGATIVE 11/10/2014 2212   PROTEINUR NEGATIVE 11/10/2014 2212   UROBILINOGEN 0.2 11/10/2014 2212   NITRITE NEGATIVE 11/10/2014 2212   LEUKOCYTESUR NEGATIVE 11/10/2014 2212   Sepsis Labs: @LABRCNTIP (procalcitonin:4,lacticidven:4) )No results found for this or any previous visit (from the past 240 hour(s)).   Radiological Exams on Admission: Ct Head Wo Contrast  01/19/2016  CLINICAL DATA:  Code stroke. Acute onset of slurred speech. Initial encounter. EXAM: CT HEAD WITHOUT CONTRAST TECHNIQUE: Contiguous axial images were obtained from the base of the skull through the vertex without intravenous contrast. COMPARISON:  CT of the head performed 11/10/2014, and MRI of the brain performed 10/01/2015 FINDINGS: There is no evidence of acute infarction, mass lesion, or intra- or extra-axial hemorrhage on CT. Evaluation is somewhat suboptimal due to motion artifact. Prominence of ventricles and sulci reflects mild to moderate cortical volume loss. Chronic infarcts are noted at the occipital lobes bilaterally, more prominent on the right, with associated encephalomalacia. A chronic infarct is also noted at the left parietal lobe, extending to the left basal ganglia. Mild periventricular and subcortical white matter change likely reflects small vessel ischemic microangiopathy. A small chronic lacunar infarct is noted at the right basal ganglia. The brainstem and fourth ventricle are within normal limits. No mass effect or midline shift is seen. There is no evidence of fracture; a left parietal craniotomy flap is unremarkable in appearance. The orbits are within normal limits. The paranasal sinuses and mastoid air cells are  well-aerated. No significant soft tissue abnormalities are seen. IMPRESSION: 1. No acute intracranial pathology seen on CT. 2. Mild to moderate cortical volume loss and scattered small vessel ischemic microangiopathy. 3. Chronic infarcts of the occipital lobes bilaterally, more prominent on the right, with associated encephalomalacia. Chronic infarct at the left parietal lobe extends to the left basal ganglia. Small chronic lacunar infarct at the right basal ganglia. These results were called by telephone at the time of interpretation on 01/19/2016 at 7:44 pm to Dr. Ezequiel Essex, who verbally acknowledged these results. Electronically Signed   By: Garald Balding M.D.   On: 01/19/2016 19:44    EKG: Ordered and pending.   Assessment/Plan  1. Aphasia, right-sided neglect - Primary concern is for seizures vs CVA/TIA  - Neurology consulted by EDP and advised  loading with Keppra and admission to [facility] for evaluation with EEG  - 1 g Keppra administered in ED, continuing patient's phenobarbital  - Will further evaluate with MRI/MRA brain, carotid dopplers, TTE, telemonitoring, EEG  - Frequent neuro checks; NPO until bedside swallow eval passed; PT, OT, and SLP evals requested  - Prophylactic ASA given  - Seizure precautions    2. Seizure disorder  - Managed with phenobarbital, level 14.7 on admission  - Loaded with Keppra in ED  - Seizure precautions    3. Hypothyroidism  - Check thyroid studies  - Continue current-dose Synthroid for now   4. Depression - Difficult to assess given the clinical scenario  - Managed at home with Elavil and Paxil, will continue   5. Hyponatremia  - Serum sodium 132 on admission in setting of apparent dehydration - Gently hydrating with NS  - Repeat chem panel in am    6. Hypertension  - BP elevated on admission  - Given the neurologic deficits and concern for possible CVA, plan to allow hypertension to 220/120 for now  - Resume home medications  (amlodipine and benazepril) as appropriate   7. Tobacco abuse  - Pt unable to engage in conversation at time of admission  - RN asked to provide smoking cessation information prior to discharge     DVT prophylaxis: sq Lovenox  Code Status: Full  Family Communication: Husband updated at bedside   Disposition Plan: Admit to stepdown at [facility] called: Neurology consulted by ED physician  Admission status: Inpatient    Vianne Bulls, MD [facility] Pager [phone]  If 7PM-7AM, please contact night-coverage [email or web address] Password [email or web address]  01/19/2016, 9:59 PM

## 2016-01-19 NOTE — ED Notes (Signed)
Pt started having slurred speech x 90 mins ago; pt recently had brain surgery x 2 weeks ago; pt has slurred speech

## 2016-01-19 NOTE — ED Provider Notes (Signed)
CSN: [id number]     Arrival date & time 01/19/16  1909 History   First MD Initiated Contact with Patient 01/19/16 1923     Chief Complaint  Patient presents with  . Aphasia     (Consider location/radiation/quality/duration/timing/severity/associated sxs/prior Treatment) HPI Comments: Level V caveat for mental status change. Patient with slurred speech and difficulty moving right side at onset about 90 minutes ago around 5:30 PM. Patient keeps repeating "I'm scared". She has difficulty following commands. She has chronic contracture left upper arm which is unchanged today. She is not following commands on the right. Contrary to triage note patient did not have brain surgery 2 weeks ago. She had a meningioma resection in November 2016. She does not take any blood thinners other than aspirin. She takes phenobarbital for seizures. Husband reports no seizures lately. No fevers or ingestions.  The history is provided by the patient and a relative. The history is limited by the condition of the patient.    Past Medical History  Diagnosis Date  . Seizures ([place])   . Hypertension   . Thyroid disease   . Stroke [facility]) 2009, 2015 9/27, 11/2014  . Aphasia    Past Surgical History  Procedure Laterality Date  . Peg tube placement    . Peg tube removal    . Abdominal hysterectomy    . Craniotomy Left 06/18/2015    Procedure: CRANIOTOMY TUMOR EXCISION w/Curve;  Surgeon: Consuella Lose, MD;  Location: [facility];  Service: Neurosurgery;  Laterality: Left;  stereotactic Left Craniotomy for resection of meningioma   Family History  Problem Relation Age of Onset  . Coronary artery disease      Stents   . Stroke Father     Hemorrhagic   . Sudden death Father   . Ulcers Mother     Bleeding   Social History  Substance Use Topics  . Smoking status: Current Every Day Smoker -- 1.50 packs/day for 50 years    Types: Cigarettes  . Smokeless tobacco: Never Used  . Alcohol Use: No   OB History     No data available     Review of Systems  Unable to perform ROS: Acuity of condition      Allergies  Codeine  Home Medications   Prior to Admission medications   Medication Sig Start Date End Date Taking? Authorizing Provider  amitriptyline (ELAVIL) 10 MG tablet Take 20 mg by mouth at bedtime.    Historical Provider, MD  amLODipine-benazepril (LOTREL) 5-10 MG per capsule Take 1 capsule by mouth daily.    Historical Provider, MD  aspirin EC 81 MG tablet Take 81 mg by mouth every evening.    Historical Provider, MD  atorvastatin (LIPITOR) 40 MG tablet Take 40 mg by mouth every evening.     Historical Provider, MD  HYDROcodone-acetaminophen (NORCO/VICODIN) 5-325 MG tablet Take 1 tablet by mouth every 4 (four) hours as needed for moderate pain. 06/23/15   Consuella Lose, MD  labetalol (NORMODYNE) 100 MG tablet Take 100 mg by mouth 2 (two) times daily.    Historical Provider, MD  levothyroxine (SYNTHROID, LEVOTHROID) 25 MCG tablet Take 25 mcg by mouth daily before breakfast.    Historical Provider, MD  PARoxetine (PAXIL) 10 MG tablet Take 10 mg by mouth daily.    Historical Provider, MD  PHENobarbital (LUMINAL) 97.2 MG tablet Take 1 tablet (97.2 mg total) by mouth at bedtime. 11/13/14   Lucia Gaskins, MD   BP 172/112 mmHg  Pulse 97  Resp 20  SpO2 100% Physical Exam  Constitutional: She appears well-developed and well-nourished. She appears distressed.  Anxious, keeps repeating "I'm scared" Slurred speech, does not follow commands  HENT:  Head: Normocephalic and atraumatic.  Mouth/Throat: Oropharynx is clear and moist. No oropharyngeal exudate.  Eyes: Conjunctivae and EOM are normal. Pupils are equal, round, and reactive to light.  Neck: Normal range of motion. Neck supple.  Cardiovascular: Normal rate, regular rhythm and normal heart sounds.   No murmur heard. Pulmonary/Chest: Breath sounds normal. No respiratory distress. She exhibits no tenderness.  Abdominal: Soft. There  is no tenderness. There is no rebound and no guarding.  Musculoskeletal: She exhibits no edema or tenderness.  Neurological: A cranial nerve deficit is present. Coordination abnormal.  Patient is agitated. She is not following commands. She appears delirious. She has contracture of her left upper extremity she is repeating phrases and does not appear to register commands. She is poorly cooperative and appears to have right-sided neglect. Her gaze does not cross to the right. Moves all extremities spontaneously. Questionable L facial droop.  Skin: Skin is warm. No rash noted.    ED Course  Procedures (including critical care time) Labs Review Labs Reviewed  COMPREHENSIVE METABOLIC PANEL - Abnormal; Notable for the following:    Sodium 132 (*)    Chloride 98 (*)    Glucose, Bld 130 (*)    AST 14 (*)    ALT 13 (*)    All other components within normal limits  URINE RAPID DRUG SCREEN, HOSP PERFORMED - Abnormal; Notable for the following:    Barbiturates POSITIVE (*)    All other components within normal limits  URINALYSIS, ROUTINE W REFLEX MICROSCOPIC (NOT AT [facility]) - Abnormal; Notable for the following:    Hgb urine dipstick TRACE (*)    All other components within normal limits  PHENOBARBITAL LEVEL - Abnormal; Notable for the following:    Phenobarbital 14.7 (*)    All other components within normal limits  URINE MICROSCOPIC-ADD ON - Abnormal; Notable for the following:    Squamous Epithelial / LPF 6-30 (*)    Bacteria, UA FEW (*)    All other components within normal limits  I-STAT CHEM 8, ED - Abnormal; Notable for the following:    Chloride 96 (*)    Glucose, Bld 128 (*)    Calcium, Ion 1.11 (*)    All other components within normal limits  CBG MONITORING, ED - Abnormal; Notable for the following:    Glucose-Capillary 140 (*)    All other components within normal limits  ETHANOL  PROTIME-INR  APTT  CBC  DIFFERENTIAL  TSH  HEMOGLOBIN A1C  LIPID PANEL  VITAMIN B12   FOLATE RBC  RPR  SEDIMENTATION RATE  HIV ANTIBODY (ROUTINE TESTING)  I-STAT TROPOININ, ED    Imaging Review Dg Chest 2 View  01/19/2016  CLINICAL DATA:  Acute onset of altered mental status. Slurred speech. Initial encounter. EXAM: CHEST  2 VIEW COMPARISON:  Chest radiograph performed 05/04/2014 FINDINGS: The lungs are well-aerated. Mild vascular congestion is noted. There is no evidence of focal opacification, pleural effusion or pneumothorax. The heart is normal in size; the mediastinal contour is within normal limits. No acute osseous abnormalities are seen. IMPRESSION: Mild vascular congestion noted.  Lungs remain grossly clear. Electronically Signed   By: Garald Balding M.D.   On: 01/19/2016 22:56   Ct Head Wo Contrast  01/19/2016  CLINICAL DATA:  Code stroke. Acute onset of slurred speech.  Initial encounter. EXAM: CT HEAD WITHOUT CONTRAST TECHNIQUE: Contiguous axial images were obtained from the base of the skull through the vertex without intravenous contrast. COMPARISON:  CT of the head performed 11/10/2014, and MRI of the brain performed 10/01/2015 FINDINGS: There is no evidence of acute infarction, mass lesion, or intra- or extra-axial hemorrhage on CT. Evaluation is somewhat suboptimal due to motion artifact. Prominence of ventricles and sulci reflects mild to moderate cortical volume loss. Chronic infarcts are noted at the occipital lobes bilaterally, more prominent on the right, with associated encephalomalacia. A chronic infarct is also noted at the left parietal lobe, extending to the left basal ganglia. Mild periventricular and subcortical white matter change likely reflects small vessel ischemic microangiopathy. A small chronic lacunar infarct is noted at the right basal ganglia. The brainstem and fourth ventricle are within normal limits. No mass effect or midline shift is seen. There is no evidence of fracture; a left parietal craniotomy flap is unremarkable in appearance. The orbits  are within normal limits. The paranasal sinuses and mastoid air cells are well-aerated. No significant soft tissue abnormalities are seen. IMPRESSION: 1. No acute intracranial pathology seen on CT. 2. Mild to moderate cortical volume loss and scattered small vessel ischemic microangiopathy. 3. Chronic infarcts of the occipital lobes bilaterally, more prominent on the right, with associated encephalomalacia. Chronic infarct at the left parietal lobe extends to the left basal ganglia. Small chronic lacunar infarct at the right basal ganglia. These results were called by telephone at the time of interpretation on 01/19/2016 at 7:44 pm to Dr. Ezequiel Essex, who verbally acknowledged these results. Electronically Signed   By: Garald Balding M.D.   On: 01/19/2016 19:44   I have personally reviewed and evaluated these images and lab results as part of my medical decision-making.   EKG Interpretation None      MDM   Final diagnoses:  Seizure disorder ([place])  Right sided weakness  Patient with acute onset of slurred speech and not following commands last seen normal 90 minutes prior to arrival. Code stroke activated.  CT head shows no hemorrhage but does show acute infarcts.  Discussed with neurology doctor Truddie Coco who feels patient is having seizure more so than stroke. Recommends getting Ativan and loading her with keppra. Patient on phenobarbital for previous history of stroke. He does not recommend TPA.   IV ativan and keppra given.  Phenobarbital level sent. Patient is more calm but still neglecting R side, moving all extremities spontaneously, not following commands well but able to follow some on the R side now. Patient with previous similar presentations in April 2016 attributed to seizures.  D/w Dr. Gifford Shave of [facility] neurology.  He agrees with transfer of patient on medical service for EEG monitoring and neurology evaluation. D/w Dr. Myna Hidalgo who will arrange transfer to hospitalist at [facility]. Family  updated.  Patient calm, maintaining airway. No tonic clonic activity.  Appears stable for transfer.  CRITICAL CARE Performed by: Ezequiel Essex Total critical care time: 45 minutes Critical care time was exclusive of separately billable procedures and treating other patients. Critical care was necessary to treat or prevent imminent or life-threatening deterioration. Critical care was time spent personally by me on the following activities: development of treatment plan with patient and/or surrogate as well as nursing, discussions with consultants, evaluation of patient's response to treatment, examination of patient, obtaining history from patient or surrogate, ordering and performing treatments and interventions, ordering and review of laboratory studies, ordering and review of radiographic  studies, pulse oximetry and re-evaluation of patient's condition.    Ezequiel Essex, MD 01/19/16 2351

## 2016-01-19 NOTE — ED Notes (Signed)
[place] paged @ [place] notified @ [phone]

## 2016-01-19 NOTE — ED Notes (Signed)
Patient unable to follow commands

## 2016-01-19 NOTE — ED Notes (Signed)
Teleneurology online at this time

## 2016-01-20 ENCOUNTER — Inpatient Hospital Stay (HOSPITAL_COMMUNITY): Payer: Medicare Other

## 2016-01-20 DIAGNOSIS — G9340 Encephalopathy, unspecified: Secondary | ICD-10-CM | POA: Diagnosis not present

## 2016-01-20 DIAGNOSIS — E871 Hypo-osmolality and hyponatremia: Secondary | ICD-10-CM | POA: Diagnosis not present

## 2016-01-20 DIAGNOSIS — G40909 Epilepsy, unspecified, not intractable, without status epilepticus: Secondary | ICD-10-CM | POA: Diagnosis not present

## 2016-01-20 DIAGNOSIS — I10 Essential (primary) hypertension: Secondary | ICD-10-CM | POA: Diagnosis not present

## 2016-01-20 DIAGNOSIS — R479 Unspecified speech disturbances: Secondary | ICD-10-CM

## 2016-01-20 DIAGNOSIS — Z8673 Personal history of transient ischemic attack (TIA), and cerebral infarction without residual deficits: Secondary | ICD-10-CM | POA: Diagnosis not present

## 2016-01-20 DIAGNOSIS — R414 Neurologic neglect syndrome: Secondary | ICD-10-CM | POA: Diagnosis not present

## 2016-01-20 DIAGNOSIS — R4701 Aphasia: Secondary | ICD-10-CM | POA: Diagnosis not present

## 2016-01-20 DIAGNOSIS — R4182 Altered mental status, unspecified: Secondary | ICD-10-CM | POA: Diagnosis not present

## 2016-01-20 DIAGNOSIS — Z720 Tobacco use: Secondary | ICD-10-CM | POA: Diagnosis not present

## 2016-01-20 LAB — MRSA PCR SCREENING: MRSA BY PCR: NEGATIVE

## 2016-01-20 LAB — ECHOCARDIOGRAM COMPLETE
CHL CUP MV DEC (S): 229
CHL CUP TV REG PEAK VELOCITY: 242 cm/s
E decel time: 229 msec
E/e' ratio: 8.24
FS: 41 % (ref 28–44)
IVS/LV PW RATIO, ED: 0.93
LA diam index: 1.66 cm/m2
LA vol A4C: 27.4 ml
LA vol index: 21.7 mL/m2
LA vol: 31.4 mL
LASIZE: 24 mm
LDCA: 2.84 cm2
LEFT ATRIUM END SYS DIAM: 24 mm
LV E/e'average: 8.24
LV PW d: 9.61 mm — AB (ref 0.6–1.1)
LV TDI E'LATERAL: 9.36
LVEEMED: 8.24
LVELAT: 9.36 cm/s
LVOT diameter: 19 mm
MV pk A vel: 92.2 m/s
MV pk E vel: 77.1 m/s
MVPG: 2 mmHg
RV TAPSE: 26.9 mm
TDI e' medial: 7.83
TR max vel: 242 cm/s
Weight: 1760.15 oz

## 2016-01-20 LAB — GLUCOSE, CAPILLARY
GLUCOSE-CAPILLARY: 101 mg/dL — AB (ref 65–99)
GLUCOSE-CAPILLARY: 108 mg/dL — AB (ref 65–99)
GLUCOSE-CAPILLARY: 96 mg/dL (ref 65–99)
Glucose-Capillary: 101 mg/dL — ABNORMAL HIGH (ref 65–99)

## 2016-01-20 LAB — SEDIMENTATION RATE: SED RATE: 19 mm/h (ref 0–22)

## 2016-01-20 LAB — VITAMIN B12: VITAMIN B 12: 306 pg/mL (ref 180–914)

## 2016-01-20 MED ORDER — LORAZEPAM 2 MG/ML IJ SOLN
1.0000 mg | Freq: Once | INTRAMUSCULAR | Status: AC
  Administered 2016-01-20: 1 mg via INTRAVENOUS
  Filled 2016-01-20: qty 1

## 2016-01-20 MED ORDER — SODIUM CHLORIDE 0.9 % IV SOLN
500.0000 mg | Freq: Two times a day (BID) | INTRAVENOUS | Status: DC
  Administered 2016-01-20 – 2016-01-21 (×3): 500 mg via INTRAVENOUS
  Filled 2016-01-20 (×4): qty 5

## 2016-01-20 MED ORDER — LORAZEPAM 1 MG PO TABS
1.0000 mg | ORAL_TABLET | Freq: Once | ORAL | Status: DC
  Filled 2016-01-20: qty 1

## 2016-01-20 NOTE — Progress Notes (Signed)
PROGRESS NOTE    [name]  [id number] DOB: 07-Jan-1946 DOA: 01/19/2016 PCP: Maricela Curet, MD   [facility]    Brief Narrative:  [name] is a 70 y.o. 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 Patient was reportedly in her usual state of health throughout the day until approximately 5:30 PM on the day of her admission when she was noted by family to have slurred speech and confusion. Patient is disoriented at time of exam and unable to contribute to the history, which is therefore obtained through discussion with the ED personnel, patient's family, and review of the medical records. Patient had not made any complaints to her family members in recent days and seemed to be in her usual state. There is been no known trauma or loss of consciousness and her family highly doubts use of alcohol or illicit substances. Family reports that she had similar symptoms previously when she was diagnosed with a stroke, and again following resection of meningioma November 2016.   Assessment & Plan:   Principal Problem:   Speech disturbance Active Problems:   History of CVA (cerebrovascular accident)   HTN (hypertension)   Tobacco use   Seizure disorder (HCC)   Hypothyroidism   Depression   Hyponatremia   Acute encephalopathy   Hemi-neglect of right side   Aphasia, right-sided neglect - EEG negative - loaded with keppra -  continuing patient's phenobarbital  -  MRI/MRA brain, carotid dopplers, TTE - diet as passed swallow eval - Prophylactic ASA given  - Seizure precautions   Seizure disorder  - Managed with phenobarbital, level 14.7 on admission  - Loaded with Keppra in ED  - Seizure precautions     Hypothyroidism  - TSH ok - Continue current-dose Synthroid for now    Depression - Difficult to assess given the clinical scenario  - Managed at home with Elavil and Paxil, will continue   Hyponatremia  - Serum sodium 132 on admission in setting of apparent dehydration - resolved  Hypertension  - BP elevated on admission  - Given the neurologic deficits and concern for possible CVA, plan to allow hypertension to 220/120 for now  - Resume home medications (amlodipine and benazepril) as appropriate   Tobacco abuse  - Pt unable to engage in conversation at time of admission  - RN asked to provide smoking cessation information prior to discharge    DVT prophylaxis:    Code Status: Full Code   Family Communication: patient  Disposition Plan:  Home with home health   Consultants:   neuro  Procedures:        Subjective: Feeling better today  Objective: Filed Vitals:   01/20/16 0400 01/20/16 0500 01/20/16 0600 01/20/16 0744  BP: 127/74 121/65 112/68 117/79  Pulse: 61 64 65   Temp:    98.6 F (37 C)  TempSrc:    Oral  Resp: 19 22 23 22   Weight:      SpO2: 100% 98% 97% 99%    Intake/Output Summary (Last 24 hours) at 01/20/16 0815 Last data filed at 01/20/16 0600  Gross per 24 hour  Intake    580 ml  Output    250 ml  Net    330 ml   Filed Weights   01/20/16 0030  Weight: 49.9 kg (  110 lb 0.2 oz)    Examination:  General exam: Appears calm and comfortable - hard of hearing Respiratory system: Clear to auscultation. Respiratory effort normal. Cardiovascular system: S1 & S2 heard, RRR. No JVD, murmurs, rubs, gallops or clicks. No pedal edema. Gastrointestinal system: Abdomen is nondistended, soft and nontender. No organomegaly or masses felt. Normal bowel sounds heard.    Data Reviewed: I have personally reviewed following labs and imaging studies  CBC:  Recent Labs Lab 01/19/16 1920 01/19/16 1932  WBC 8.8  --   NEUTROABS 4.6  --    HGB 12.8 13.6  HCT 36.9 40.0  MCV 89.6  --   PLT 271  --    Basic Metabolic Panel:  Recent Labs Lab 01/19/16 1920 01/19/16 1932  NA 132* 135  K 3.8 3.9  CL 98* 96*  CO2 27  --   GLUCOSE 130* 128*  BUN 8 6  CREATININE 0.54 0.60  CALCIUM 9.0  --    GFR: CrCl cannot be calculated (Unknown ideal weight.). Liver Function Tests:  Recent Labs Lab 01/19/16 1920  AST 14*  ALT 13*  ALKPHOS 62  BILITOT 0.3  PROT 7.7  ALBUMIN 4.4   No results for input(s): LIPASE, AMYLASE in the last 168 hours. No results for input(s): AMMONIA in the last 168 hours. Coagulation Profile:  Recent Labs Lab 01/19/16 1920  INR 0.96   Cardiac Enzymes: No results for input(s): CKTOTAL, CKMB, CKMBINDEX, TROPONINI in the last 168 hours. BNP (last 3 results) No results for input(s): PROBNP in the last 8760 hours. HbA1C: No results for input(s): HGBA1C in the last 72 hours. CBG:  Recent Labs Lab 01/19/16 1924 01/20/16 0411  GLUCAP 140* 101*   Lipid Profile: No results for input(s): CHOL, HDL, LDLCALC, TRIG, CHOLHDL, LDLDIRECT in the last 72 hours. Thyroid Function Tests:  Recent Labs  01/19/16 2214  TSH 0.798   Anemia Panel: No results for input(s): VITAMINB12, FOLATE, FERRITIN, TIBC, IRON, RETICCTPCT in the last 72 hours. Urine analysis:    Component Value Date/Time   COLORURINE YELLOW 01/19/2016 2230   APPEARANCEUR CLEAR 01/19/2016 2230   LABSPEC 1.015 01/19/2016 2230   PHURINE 7.5 01/19/2016 2230   GLUCOSEU NEGATIVE 01/19/2016 2230   HGBUR TRACE* 01/19/2016 2230   BILIRUBINUR NEGATIVE 01/19/2016 2230   KETONESUR NEGATIVE 01/19/2016 2230   PROTEINUR NEGATIVE 01/19/2016 2230   UROBILINOGEN 0.2 11/10/2014 2212   NITRITE NEGATIVE 01/19/2016 2230   LEUKOCYTESUR NEGATIVE 01/19/2016 2230     Recent Results (from the past 240 hour(s))  MRSA PCR Screening     Status: None   Collection Time: 01/20/16 12:27 AM  Result Value Ref Range Status   MRSA by PCR NEGATIVE NEGATIVE  Final    Comment:        The GeneXpert MRSA Assay (FDA approved for NASAL specimens only), is one component of a comprehensive MRSA colonization surveillance program. It is not intended to diagnose MRSA infection nor to guide or monitor treatment for MRSA infections.       Anti-infectives    None       Radiology Studies: Dg Chest 2 View  01/19/2016  CLINICAL DATA:  Acute onset of altered mental status. Slurred speech. Initial encounter. EXAM: CHEST  2 VIEW COMPARISON:  Chest radiograph performed 05/04/2014 FINDINGS: The lungs are well-aerated. Mild vascular congestion is noted. There is no evidence of focal opacification, pleural effusion or pneumothorax. The heart is normal in size; the mediastinal contour is within normal limits. No acute  osseous abnormalities are seen. IMPRESSION: Mild vascular congestion noted.  Lungs remain grossly clear. Electronically Signed   By: Garald Balding M.D.   On: 01/19/2016 22:56   Ct Head Wo Contrast  01/19/2016  CLINICAL DATA:  Code stroke. Acute onset of slurred speech. Initial encounter. EXAM: CT HEAD WITHOUT CONTRAST TECHNIQUE: Contiguous axial images were obtained from the base of the skull through the vertex without intravenous contrast. COMPARISON:  CT of the head performed 11/10/2014, and MRI of the brain performed 10/01/2015 FINDINGS: There is no evidence of acute infarction, mass lesion, or intra- or extra-axial hemorrhage on CT. Evaluation is somewhat suboptimal due to motion artifact. Prominence of ventricles and sulci reflects mild to moderate cortical volume loss. Chronic infarcts are noted at the occipital lobes bilaterally, more prominent on the right, with associated encephalomalacia. A chronic infarct is also noted at the left parietal lobe, extending to the left basal ganglia. Mild periventricular and subcortical white matter change likely reflects small vessel ischemic microangiopathy. A small chronic lacunar infarct is noted at the  right basal ganglia. The brainstem and fourth ventricle are within normal limits. No mass effect or midline shift is seen. There is no evidence of fracture; a left parietal craniotomy flap is unremarkable in appearance. The orbits are within normal limits. The paranasal sinuses and mastoid air cells are well-aerated. No significant soft tissue abnormalities are seen. IMPRESSION: 1. No acute intracranial pathology seen on CT. 2. Mild to moderate cortical volume loss and scattered small vessel ischemic microangiopathy. 3. Chronic infarcts of the occipital lobes bilaterally, more prominent on the right, with associated encephalomalacia. Chronic infarct at the left parietal lobe extends to the left basal ganglia. Small chronic lacunar infarct at the right basal ganglia. These results were called by telephone at the time of interpretation on 01/19/2016 at 7:44 pm to Dr. Ezequiel Essex, who verbally acknowledged these results. Electronically Signed   By: Garald Balding M.D.   On: 01/19/2016 19:44        Scheduled Meds: .  stroke: mapping our early stages of recovery book   Does not apply Once  . amitriptyline  20 mg Oral QHS  . aspirin  300 mg Rectal Daily   Or  . aspirin  325 mg Oral Daily  . atorvastatin  40 mg Oral QPM  . enoxaparin (LOVENOX) injection  40 mg Subcutaneous Q24H  . levothyroxine  25 mcg Oral QAC breakfast  . PARoxetine  10 mg Oral Daily  . PHENobarbital  97.2 mg Oral QHS   Continuous Infusions:    LOS: 1 day    Time spent: 35 min    [place], DO [facility] Pager [phone]  If 7PM-7AM, please contact night-coverage [email or web address] Password [facility] 01/20/2016, 8:15 AM

## 2016-01-20 NOTE — Care Management Important Message (Signed)
Important Message  Patient Details  Name: [name] MRN: [id number] Date of Birth: 16-Oct-1945   Medicare Important Message Given:  Yes    Loann Quill 01/20/2016, 9:33 AM

## 2016-01-20 NOTE — Progress Notes (Signed)
EEG Completed; Results Pending  

## 2016-01-20 NOTE — Progress Notes (Signed)
Echocardiogram 2D Echocardiogram has been performed.  [name] 01/20/2016, 3:44 PM

## 2016-01-20 NOTE — Consult Note (Signed)
NEURO HOSPITALIST CONSULT NOTE   Requestig physician: Dr. Eliseo Squires  Reason for Consult: AMS  History obtained from:  Pt Chart     HPI:                                                                                                                                          [name] is an 70 y.o. female who presents with recurrent symptoms of dysarthria and confusion. 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 Patient was reportedly in her usual state of health throughout the day until approximately 5:30 PM on the day of her admission when she was noted by family to have slurred speech and confusion. Patient is disoriented at time of exam and unable to contribute to the history, which is therefore obtained through discussion with the ED personnel, patient's family, and review of the medical records. Patient had not made any complaints to her family members in recent days and seemed to be in her usual state. There is been no known trauma or loss of consciousness and her family highly doubts use of alcohol or illicit substances. Family reports that she had similar symptoms previously when she was diagnosed with a stroke, and again following resection of meningioma November 2016. Speech disturbance that was present following meningioma resection and ultimately resolved spontaneously and she had been conversing appropriately with her family members this evening. After approximately 5:30 PM, her speech was noted to be slow and she was obviously confused."   CT head was obtained in ED showing no acute intracranial process. There was concern for possible seizure and patient was loaded with Keppra.  EEG was ordered along with MRI/MRA. Phenobarbital  level was 14.7  Past Medical History  Diagnosis Date  . Seizures ([place])   . Hypertension   . Thyroid disease   . Stroke [facility]) 2009, 2015 9/27, 11/2014  . Aphasia     Past Surgical History  Procedure Laterality Date  . Peg tube placement    . Peg tube removal    . Abdominal hysterectomy    . Craniotomy Left 06/18/2015    Procedure: CRANIOTOMY TUMOR EXCISION w/Curve;  Surgeon: Consuella Lose, MD;  Location: [facility];  Service: Neurosurgery;  Laterality: Left;  stereotactic Left Craniotomy for resection of meningioma    Family History  Problem Relation Age of Onset  . Coronary artery disease      Stents   . Stroke Father     Hemorrhagic   . Sudden death Father   . Ulcers Mother     Bleeding    Social  History:  reports that she has been smoking Cigarettes.  She has a 75 pack-year smoking history. She has never used smokeless tobacco. She reports that she does not drink alcohol or use illicit drugs.  Allergies  Allergen Reactions  . Codeine Nausea And Vomiting and Rash    MEDICATIONS:                                                                                                                     Prior to Admission:  Prescriptions prior to admission  Medication Sig Dispense Refill Last Dose  . amitriptyline (ELAVIL) 10 MG tablet Take 20 mg by mouth at bedtime.   01/18/2016 at Unknown time  . amLODipine-benazepril (LOTREL) 5-10 MG per capsule Take 1 capsule by mouth daily.   01/19/2016 at Unknown time  . aspirin EC 81 MG tablet Take 81 mg by mouth every evening.   01/19/2016 at Unknown time  . atorvastatin (LIPITOR) 40 MG tablet Take 40 mg by mouth every evening.    01/18/2016 at Unknown time  . labetalol (NORMODYNE) 100 MG tablet Take 100 mg by mouth 2 (two) times daily.   01/19/2016 at Unknown time  . levothyroxine (SYNTHROID, LEVOTHROID) 25 MCG tablet Take 25 mcg by mouth daily before breakfast.   01/19/2016 at Unknown time  . PARoxetine (PAXIL) 10 MG tablet Take 10 mg  by mouth daily.   01/19/2016 at Unknown time  . PHENobarbital (LUMINAL) 97.2 MG tablet Take 1 tablet (97.2 mg total) by mouth at bedtime. 30 tablet 4 01/18/2016 at Unknown time  . HYDROcodone-acetaminophen (NORCO/VICODIN) 5-325 MG tablet Take 1 tablet by mouth every 4 (four) hours as needed for moderate pain. (Patient not taking: Reported on 01/19/2016) 30 tablet 0    Scheduled: .  stroke: mapping our early stages of recovery book   Does not apply Once  . amitriptyline  20 mg Oral QHS  . aspirin  300 mg Rectal Daily   Or  . aspirin  325 mg Oral Daily  . atorvastatin  40 mg Oral QPM  . enoxaparin (LOVENOX) injection  40 mg Subcutaneous Q24H  . levothyroxine  25 mcg Oral QAC breakfast  . LORazepam  1 mg Oral Once  . PARoxetine  10 mg Oral Daily  . PHENobarbital  97.2 mg Oral QHS    ROS:  History obtained from unobtainable from patient due to mental status  Blood pressure 117/79, pulse 65, temperature 98.6 F (37 C), temperature source Oral, resp. rate 22, weight 49.9 kg (110 lb 0.2 oz), SpO2 99 %.   Neurologic Examination:                                                                                                      HEENT-  Normocephalic, no lesions, without obvious abnormality.  Normal external eye and conjunctiva.  Normal TM's bilaterally.  Normal auditory canals and external ears. Normal external nose, mucus membranes and septum.  Normal pharynx. Cardiovascular- S1, S2 normal, pulses palpable throughout   Lungs- chest clear, no wheezing, rales, normal symmetric air entry Abdomen- normal findings: bowel sounds normal Extremities- no edema Lymph-no adenopathy palpable Musculoskeletal-no joint tenderness, deformity or swelling Skin-warm and dry, no hyperpigmentation, vitiligo, or suspicious lesions  Neurological Examination Mental Status: Alert, is  very concerned about where her teeth are. She is not aware of where she is or why she is in the hospital. She is able to follow commands if told many times due to hearing loss but unable to take part in complex three step commands.  Cranial Nerves: II:  Able to count fingers but vision is poor throughout, pupils equal, round, reactive to light 1 mm bilaterally III,IV, VI: ptosis not present, extra-ocular motions intact bilaterally V,VII: smile symmetric, facial light touch sensation normal bilaterally VIII: hearing decreased bilaterally IX,X: uvula rises symmetrically XI: bilateral shoulder shrug XII: midline tongue extension Motor: Right : Upper extremity   5/5    Left:     Upper extremity   4/5  Lower extremity   5/5     Lower extremity   5/5 --left hand has contraction of fingers (baseline)  Normal tone on right. Increased tone LUE with some associated atrophy.  Sensory: Pinprick and light touch intact throughout, bilaterally Deep Tendon Reflexes: 2+ throughout right arm KJ but 1+ in right AJ. Left UE and KJ brisk 3+ with 2+ in AJ Plantars: Right: downgoing   Left: downgoing Cerebellar: Unable to perform due to not understanding what was asked of her.  Gait: not tested.     Lab Results: Basic Metabolic Panel:  Recent Labs Lab 01/19/16 1920 01/19/16 1932  NA 132* 135  K 3.8 3.9  CL 98* 96*  CO2 27  --   GLUCOSE 130* 128*  BUN 8 6  CREATININE 0.54 0.60  CALCIUM 9.0  --     Liver Function Tests:  Recent Labs Lab 01/19/16 1920  AST 14*  ALT 13*  ALKPHOS 62  BILITOT 0.3  PROT 7.7  ALBUMIN 4.4   No results for input(s): LIPASE, AMYLASE in the last 168 hours. No results for input(s): AMMONIA in the last 168 hours.  CBC:  Recent Labs Lab 01/19/16 1920 01/19/16 1932  WBC 8.8  --   NEUTROABS 4.6  --   HGB 12.8 13.6  HCT 36.9 40.0  MCV 89.6  --   PLT 271  --     Cardiac Enzymes: No results  for input(s): CKTOTAL, CKMB, CKMBINDEX, TROPONINI in the last 168  hours.  Lipid Panel: No results for input(s): CHOL, TRIG, HDL, CHOLHDL, VLDL, LDLCALC in the last 168 hours.  CBG:  Recent Labs Lab 01/19/16 1924 01/20/16 0411 01/20/16 0747  GLUCAP 140* 101* 108*    Microbiology: Results for orders placed or performed during the hospital encounter of 01/19/16  MRSA PCR Screening     Status: None   Collection Time: 01/20/16 12:27 AM  Result Value Ref Range Status   MRSA by PCR NEGATIVE NEGATIVE Final    Comment:        The GeneXpert MRSA Assay (FDA approved for NASAL specimens only), is one component of a comprehensive MRSA colonization surveillance program. It is not intended to diagnose MRSA infection nor to guide or monitor treatment for MRSA infections.     Coagulation Studies:  Recent Labs  01/19/16 1920  LABPROT 13.0  INR 0.96    Imaging: Dg Chest 2 View  01/19/2016  CLINICAL DATA:  Acute onset of altered mental status. Slurred speech. Initial encounter. EXAM: CHEST  2 VIEW COMPARISON:  Chest radiograph performed 05/04/2014 FINDINGS: The lungs are well-aerated. Mild vascular congestion is noted. There is no evidence of focal opacification, pleural effusion or pneumothorax. The heart is normal in size; the mediastinal contour is within normal limits. No acute osseous abnormalities are seen. IMPRESSION: Mild vascular congestion noted.  Lungs remain grossly clear. Electronically Signed   By: Garald Balding M.D.   On: 01/19/2016 22:56   Ct Head Wo Contrast  01/19/2016  CLINICAL DATA:  Code stroke. Acute onset of slurred speech. Initial encounter. EXAM: CT HEAD WITHOUT CONTRAST TECHNIQUE: Contiguous axial images were obtained from the base of the skull through the vertex without intravenous contrast. COMPARISON:  CT of the head performed 11/10/2014, and MRI of the brain performed 10/01/2015 FINDINGS: There is no evidence of acute infarction, mass lesion, or intra- or extra-axial hemorrhage on CT. Evaluation is somewhat suboptimal due  to motion artifact. Prominence of ventricles and sulci reflects mild to moderate cortical volume loss. Chronic infarcts are noted at the occipital lobes bilaterally, more prominent on the right, with associated encephalomalacia. A chronic infarct is also noted at the left parietal lobe, extending to the left basal ganglia. Mild periventricular and subcortical white matter change likely reflects small vessel ischemic microangiopathy. A small chronic lacunar infarct is noted at the right basal ganglia. The brainstem and fourth ventricle are within normal limits. No mass effect or midline shift is seen. There is no evidence of fracture; a left parietal craniotomy flap is unremarkable in appearance. The orbits are within normal limits. The paranasal sinuses and mastoid air cells are well-aerated. No significant soft tissue abnormalities are seen. IMPRESSION: 1. No acute intracranial pathology seen on CT. 2. Mild to moderate cortical volume loss and scattered small vessel ischemic microangiopathy. 3. Chronic infarcts of the occipital lobes bilaterally, more prominent on the right, with associated encephalomalacia. Chronic infarct at the left parietal lobe extends to the left basal ganglia. Small chronic lacunar infarct at the right basal ganglia. These results were called by telephone at the time of interpretation on 01/19/2016 at 7:44 pm to Dr. Ezequiel Essex, who verbally acknowledged these results. Electronically Signed   By: Garald Balding M.D.   On: 01/19/2016 19:44    History and exam documented by Etta Quill PA-C, [facility], [phone] Assessment and plan per attending Neurologist.  Impression: 57. 70 year old female with AMS and suspected break-through  seizure.  Currently she is awake and not clinically seizing, with slurred speech and confusion noted on exam. Serum phenobarbital level was low; unsure if this is due to noncompliance or too low of a prescribed dose.  2. History of multiple  prior strokes with residual left-sided motor deficits.  3. CT head reveals no acute findings; there is mild to moderate cortical volume loss and scattered small vessel ischemic microangiopathy, as well as chronic infarcts of the occipital lobes bilaterally, more prominent on the right, with associated encephalomalacia; a chronic infarct at the left parietal lobe extends to the left basal ganglia. A small chronic lacunar infarct at the right basal ganglia is also noted. 4. History of meningioma resection November 2016.  5. Hypothyrodism.   Recommendations: 1. EEG pending.  2. Continue scheduled Keppra at 500 mg BID. 3. If she has been compliant with phenobarbital, her scheduled dose will need to be increased by 50%. If she has been non-compliant, then continue at prescribed daily home dosage and encourage compliance.   4. MRI/MRA of brain.  5. Continue ASA and atorvastatin.   01/20/2016, 10:25 AM Electronically signed: Dr. Kerney Elbe

## 2016-01-20 NOTE — Procedures (Signed)
ELECTROENCEPHALOGRAM REPORT  Date of Study: 01/20/2016  Patient's Name: [name] MRN: [id number] Date of Birth: 09-May-1946  Referring Provider: Mitzi Hansen, MD  Indication: 70 y.o. 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   Medications: amitriptyline (ELAVIL) tablet 20 mg aspirin tablet 325 mg atorvastatin (LIPITOR) tablet 40 mg enoxaparin (LOVENOX) injection 40 mg hydrALAZINE (APRESOLINE) injection 10 mg levothyroxine (SYNTHROID, LEVOTHROID) tablet 25 mcg PARoxetine (PAXIL) tablet 10 mg PHENobarbital (LUMINAL) tablet 97.2 mg senna-docusate (Senokot-S) tablet 1 tablet  Technical Summary: This is a multichannel digital EEG recording, using the international 10-20 placement system with electrodes applied with paste and impedances below 5000 ohms.    Description: The EEG background is symmetric, with a well-developed posterior dominant rhythm of 10 Hz, which is reactive to eye opening and closing.  Diffuse beta activity is seen, with a bilateral frontal preponderance.  No focal or generalized abnormalities seen.  No focal or generalized epileptiform discharges are seen.  Stage II sleep is not seen.  Hyperventilation and photic stimulation were not performed.  ECG revealed normal cardiac rate and rhythm.  Impression: This is a normal routine EEG of the awake and drowsy states.  A normal study does not rule out the possibility of a seizure disorder in this patient.  Ellery Tash R. Tomi Likens, DO

## 2016-01-20 NOTE — Evaluation (Signed)
Physical Therapy Evaluation Patient Details Name: [name] MRN: [id number] DOB: [birth year] Today's Date: 01/20/2016   History of Present Illness  pt is a 70 y/o female with h/o multiple CVA's and residual left-sided motor deficits HTN, seizure d/o, post meningioma resection 11/16, admitted with sudden onset of slurred speech and confusion.  Work up ongoing for seizure.  Clinical Impression  Pt admitted with/for slurred speech and confusion, suspected due to seizure.  Pt currently limited functionally due to the problems listed below.  (see problems list.)  Pt will benefit from PT to maximize function and safety to be able to get home safely with available assist of husband and her sister..     Follow Up Recommendations Home health PT    Equipment Recommendations       Recommendations for Other Services       Precautions / Restrictions Precautions Precautions: Fall      Mobility  Bed Mobility Overal bed mobility: Needs Assistance Bed Mobility: Supine to Sit;Sit to Supine     Supine to sit: Min assist Sit to supine: Min guard   General bed mobility comments: uncoordinated movement and does not wait to clear lines, but little assist or cues needed.  Transfers Overall transfer level: Needs assistance   Transfers: Sit to/from Stand Sit to Stand: Min guard         General transfer comment: does not use safe technique to prevent falling  Ambulation/Gait Ambulation/Gait assistance: Min assist Ambulation Distance (Feet): 150 Feet Assistive device: 1 person hand held assist Gait Pattern/deviations: Step-through pattern;Decreased step length - right;Decreased step length - left;Decreased stride length;Shuffle;Narrow base of support Gait velocity: slower   General Gait Details: pt needed direction to negotiate obstacles due to apparent visual deficits.  Stairs            Wheelchair Mobility    Modified Rankin (Stroke Patients Only)       Balance  Overall balance assessment: Needs assistance Sitting-balance support: No upper extremity supported Sitting balance-Leahy Scale: Fair     Standing balance support: No upper extremity supported;Single extremity supported Standing balance-Leahy Scale: Fair                               Pertinent Vitals/Pain Pain Assessment: Faces Faces Pain Scale: No hurt    Home Living Family/patient expects to be discharged to:: Private residence Living Arrangements: Spouse/significant other Available Help at Discharge: Family;Other (Comment) (sister and husband) Type of Home: House Home Access: Stairs to enter Entrance Stairs-Rails: Psychiatric nurse of Steps: 2 Home Layout: One level   Additional Comments: pt states walks in house by herself, but not sure of PLOF    Prior Function           Comments: no sure of PLOF, no family present     Hand Dominance        Extremity/Trunk Assessment   Upper Extremity Assessment: Defer to OT evaluation           Lower Extremity Assessment: Generalized weakness;RLE deficits/detail;LLE deficits/detail RLE Deficits / Details: generally weak LLE Deficits / Details: Generally weak and uncoordinated movement     Communication   Communication: HOH  Cognition Arousal/Alertness: Awake/alert Behavior During Therapy: Restless Overall Cognitive Status: Impaired/Different from baseline Area of Impairment: Attention;Following commands;Safety/judgement;Problem solving   Current Attention Level: Sustained   Following Commands: Follows one step commands with increased time;Follows one step commands inconsistently Safety/Judgement: Decreased awareness of safety  Problem Solving: Slow processing      General Comments General comments (skin integrity, edema, etc.): vitals stable.  tactile cues used for direction in place of verbal cuing due to severe HOH.    Exercises        Assessment/Plan    PT Assessment  Patient needs continued PT services  PT Diagnosis Abnormality of gait;Generalized weakness (hemiparesis)   PT Problem List Decreased strength;Decreased activity tolerance;Decreased balance;Decreased mobility;Decreased coordination  PT Treatment Interventions Gait training;Functional mobility training;Stair training;Balance training;Patient/family education;Therapeutic activities   PT Goals (Current goals can be found in the Care Plan section) Acute Rehab PT Goals Patient Stated Goal: pt wanted to go home PT Goal Formulation: Patient unable to participate in goal setting Time For Goal Achievement: 02/03/16 Potential to Achieve Goals: Good    Frequency Min 3X/week   Barriers to discharge        Co-evaluation               End of Session   Activity Tolerance: Patient tolerated treatment well Patient left: in bed;with call bell/phone within reach Nurse Communication: Mobility status         Time: [id number] PT Time Calculation (min) (ACUTE ONLY): 27 min   Charges:   PT Evaluation $PT Eval Moderate Complexity: 1 Procedure PT Treatments $Gait Training: 8-22 mins   PT G Codes:        Orlander Norwood, Tessie Fass 01/20/2016, 12:14 PM 01/20/2016  Donnella Sham, PT [phone]  (pager)

## 2016-01-21 DIAGNOSIS — G40909 Epilepsy, unspecified, not intractable, without status epilepticus: Secondary | ICD-10-CM | POA: Diagnosis not present

## 2016-01-21 DIAGNOSIS — R4701 Aphasia: Secondary | ICD-10-CM | POA: Diagnosis not present

## 2016-01-21 DIAGNOSIS — Z720 Tobacco use: Secondary | ICD-10-CM | POA: Diagnosis not present

## 2016-01-21 DIAGNOSIS — R414 Neurologic neglect syndrome: Secondary | ICD-10-CM | POA: Diagnosis not present

## 2016-01-21 DIAGNOSIS — R4182 Altered mental status, unspecified: Secondary | ICD-10-CM | POA: Diagnosis not present

## 2016-01-21 DIAGNOSIS — R479 Unspecified speech disturbances: Secondary | ICD-10-CM | POA: Diagnosis not present

## 2016-01-21 LAB — CBC
HCT: 35 % — ABNORMAL LOW (ref 36.0–46.0)
Hemoglobin: 11.8 g/dL — ABNORMAL LOW (ref 12.0–15.0)
MCH: 29.6 pg (ref 26.0–34.0)
MCHC: 33.7 g/dL (ref 30.0–36.0)
MCV: 87.9 fL (ref 78.0–100.0)
PLATELETS: 266 10*3/uL (ref 150–400)
RBC: 3.98 MIL/uL (ref 3.87–5.11)
RDW: 14.3 % (ref 11.5–15.5)
WBC: 6.8 10*3/uL (ref 4.0–10.5)

## 2016-01-21 LAB — FOLATE RBC
Folate, Hemolysate: 223 ng/mL
Folate, RBC: 632 ng/mL (ref >498)
HEMATOCRIT: 35.3 % (ref 34.0–46.6)

## 2016-01-21 LAB — BASIC METABOLIC PANEL
Anion gap: 9 (ref 5–15)
BUN: 6 mg/dL (ref 6–20)
CALCIUM: 9.4 mg/dL (ref 8.9–10.3)
CO2: 24 mmol/L (ref 22–32)
CREATININE: 0.56 mg/dL (ref 0.44–1.00)
Chloride: 104 mmol/L (ref 101–111)
GFR calc Af Amer: >60 mL/min (ref >60)
GLUCOSE: 91 mg/dL (ref 65–99)
POTASSIUM: 3.6 mmol/L (ref 3.5–5.1)
SODIUM: 137 mmol/L (ref 135–145)

## 2016-01-21 LAB — RPR: RPR: NONREACTIVE

## 2016-01-21 LAB — GLUCOSE, CAPILLARY
GLUCOSE-CAPILLARY: 100 mg/dL — AB (ref 65–99)
Glucose-Capillary: 100 mg/dL — ABNORMAL HIGH (ref 65–99)

## 2016-01-21 LAB — HEMOGLOBIN A1C
Hgb A1c MFr Bld: 5.9 % — ABNORMAL HIGH (ref 4.8–5.6)
Mean Plasma Glucose: 123 mg/dL

## 2016-01-21 LAB — HIV ANTIBODY (ROUTINE TESTING W REFLEX): HIV SCREEN 4TH GENERATION: NONREACTIVE

## 2016-01-21 MED ORDER — LEVETIRACETAM 500 MG PO TABS: 500.0000 mg | ORAL_TABLET | Freq: Two times a day (BID) | ORAL | Status: DC

## 2016-01-21 MED ORDER — LEVETIRACETAM 500 MG PO TABS
500.0000 mg | ORAL_TABLET | Freq: Two times a day (BID) | ORAL | Status: DC
  Administered 2016-01-21: 500 mg via ORAL
  Filled 2016-01-21: qty 1

## 2016-01-21 NOTE — Progress Notes (Signed)
CM met with the patient to go over choices for [name] services. Patient wanted her husband or sister called. CM left a message on Ms Gita Kudo phone and was unable to reach Mr Tschetter. CM will continue to follow and attempt to reach family.

## 2016-01-21 NOTE — Progress Notes (Signed)
Called to update sister-- cell phone cut off conversation.  Hope for d/c later today if ok with neuro. MRI negative Eulogio Bear

## 2016-01-21 NOTE — Care Management Important Message (Signed)
Important Message  Patient Details  Name: [name] MRN: [id number] Date of Birth: 1945/09/09   Medicare Important Message Given:  Yes    Loann Quill 01/21/2016, 9:20 AM

## 2016-01-21 NOTE — Care Management Note (Signed)
Case Management Note  Patient Details  Name: [name] MRN: [id number] Date of Birth: 1945/12/19  Subjective/Objective:                    Action/Plan: Plan is for patient to discharge home with [facility] services. CM met with the patient and her family and provided them a list of [facility] agencies in the [place] area. They selected [place]. Butch Penny with [facility] notified and accepted the referral. Bedside RN udpated.   Expected Discharge Date:                  Expected Discharge Plan:  [place]  In-House Referral:     Discharge planning Services  CM Consult  Post Acute Care Choice:    Choice offered to:     DME Arranged:    DME Agency:     HH Arranged:  PT, OT HH Agency:  [facility]  Status of Service:  Completed, signed off  Medicare Important Message Given:  Yes Date Medicare IM Given:    Medicare IM give by:    Date Additional Medicare IM Given:    Additional Medicare Important Message give by:     If discussed at [place] of Stay Meetings, dates discussed:    Additional Comments:  [name], RN 01/21/2016, 12:40 PM

## 2016-01-21 NOTE — Evaluation (Signed)
Clinical/Bedside Swallow Evaluation Patient Details  Name: [name] MRN: [id number] Date of Birth: 07-22-1946  Today's Date: 01/21/2016 Time: SLP Start Time (ACUTE ONLY): 0945 SLP Stop Time (ACUTE ONLY): 1004 SLP Time Calculation (min) (ACUTE ONLY): 19 min  Past Medical History:  Past Medical History  Diagnosis Date  . Seizures ([place])   . Hypertension   . Thyroid disease   . Stroke [facility]) 2009, 2015 9/27, 11/2014  . Aphasia    Past Surgical History:  Past Surgical History  Procedure Laterality Date  . Peg tube placement    . Peg tube removal    . Abdominal hysterectomy    . Craniotomy Left 06/18/2015    Procedure: CRANIOTOMY TUMOR EXCISION w/Curve;  Surgeon: Consuella Lose, MD;  Location: [facility];  Service: Neurosurgery;  Laterality: Left;  stereotactic Left Craniotomy for resection of meningioma   HPI:  70 year old female admitted 01/19/16 with slurred speech and confusion. PMH significant for seizures, HTN, thyroid disease, multiple CVA's, PEG placement/removal, meningioma with crani (06/2015). MRI negative, CT negative, EEG negative, CXR lungs clear, CTAB per MD. Orders placed for BSE/SLE given stroke symptoms.   Assessment / Plan / Recommendation Clinical Impression  Pt observed during breakfast meal, being fed by RN. No overt s/s aspiration with any consistency, however, pt did demonstrate occasional throat clear after she had finished eating/drinking. Pt exhibited difficulty chewing sausage and bagel, likely due to ill fitting dentures. Will change diet to dys 3 with chopped meats for energy conservation and to facilitate safe/adequate po intake. Pt presents with baseline com/cog issues, so formal evaluation was deferred at this time. RN reports DC likely today. Please reconsult if needs arise.    Aspiration Risk  Mild aspiration risk    Diet Recommendation Dysphagia 3 (Mech soft);Thin liquid (chop meats)   Liquid Administration via: Straw;Cup Medication  Administration: Whole meds with puree Supervision: Staff to assist with self feeding Compensations: Minimize environmental distractions;Slow rate;Small sips/bites;Follow solids with liquid Postural Changes: Seated upright at 90 degrees;Remain upright for at least 30 minutes after po intake    Other  Recommendations Oral Care Recommendations: Oral care BID       Prognosis Prognosis for Safe Diet Advancement: Fair Barriers to Reach Goals: Cognitive deficits      Swallow Study   General Date of Onset: 01/19/16 HPI: 70 year old female admitted 01/19/16 with slurred speech and confusion. PMH significant for seizures, HTN, thyroid disease, multiple CVA's, PEG placement/removal, meningioma with crani (06/2015). MRI negative, CT negative, EEG negative, CXR lungs clear, CTAB per MD. Orders placed for BSE/SLE given stroke symptoms. Type of Study: Bedside Swallow Evaluation Previous Swallow Assessment: BSE 06/2015 - rec puree, thin liquid Diet Prior to this Study: Regular;Thin liquids Temperature Spikes Noted: No (tMax 99.2 01/20/16) Respiratory Status: Room air History of Recent Intubation: No Behavior/Cognition: Alert;Cooperative;Pleasant mood Oral Cavity Assessment: Within Functional Limits Oral Care Completed by SLP: No (pt eating breakfast when SLP entered) Oral Cavity - Dentition: Dentures, bottom;Dentures, top (ill fitting) Vision: Functional for self-feeding Self-Feeding Abilities: Total assist Patient Positioning: Upright in bed Baseline Vocal Quality: Normal Volitional Cough: Strong Volitional Swallow: Unable to elicit    Oral/Motor/Sensory Function Overall Oral Motor/Sensory Function: Within functional limits   Ice Chips Ice chips: Not tested   Thin Liquid Thin Liquid: Within functional limits Presentation: Straw;Cup    Nectar Thick Nectar Thick Liquid: Not tested   Honey Thick Honey Thick Liquid: Not tested   Puree Puree: Not tested   Solid  Solid: Impaired Oral Phase  Impairments: Impaired mastication Oral Phase Functional Implications: Right anterior spillage;Impaired mastication;Prolonged oral transit       Shonna Chock 01/21/2016,10:04 AM  Enriqueta Shutter. [facility], [place] [phone]

## 2016-01-21 NOTE — Discharge Summary (Signed)
Physician Discharge Summary  [name] [id number] DOB: [birth year] DOA: 01/19/2016  PCP: Maricela Curet, MD  Admit date: 01/19/2016 Discharge date: 01/21/2016   Recommendations for Outpatient Follow-Up:   1. Outpatient neurology follow up for adjustments of meds 2. ENT follow up for hearing difficulties 3. Home health    Discharge Diagnosis:   Principal Problem:   Speech disturbance Active Problems:   History of CVA (cerebrovascular accident)   HTN (hypertension)   Tobacco use   Seizure disorder ([place])   Hypothyroidism   Depression   Hyponatremia   Acute encephalopathy   Hemi-neglect of right side   Discharge disposition:  Home  Discharge Condition: Improved.  Diet recommendation: DYS  Wound care: None.   History of Present Illness:   [name] is a 70 y.o. 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 Patient was reportedly in her usual state of health throughout the day until approximately 5:30 PM on the day of her admission when she was noted by family to have slurred speech and confusion. Patient is disoriented at time of exam and unable to contribute to the history, which is therefore obtained through discussion with the ED personnel, patient's family, and review of the medical records. Patient had not made any complaints to her family members in recent days and seemed to be in her usual state. There is been no known trauma or loss of consciousness and her family highly doubts use of alcohol or illicit substances. Family reports that she had similar symptoms previously when she was diagnosed with a stroke, and again following resection of meningioma November 2016. Speech disturbance that was present following  meningioma resection and ultimately resolved spontaneously and she had been conversing appropriately with her family members this evening. After approximately 5:30 PM, her speech was noted to be slow and she was obviously confused. She was brought into the emergency department for evaluation of this.   Hospital Course by Problem:   Aphasia, right-sided neglect-- appeared to be a seizure - EEG negative - loaded with keppra - continuing patient's phenobarbital  - MRI/MRA brain negative - diet as passed swallow eval - Prophylactic ASA given  - Seizure precautions   Seizure disorder  - Managed with phenobarbital, level 14.7 on admission  - Loaded with Keppra in ED  - Seizure precautions  -outpatient neuro follow up  Hypothyroidism  - TSH ok - Continue current-dose Synthroid for now   Depression - Difficult to assess given the clinical scenario  - Managed at home with Elavil and Paxil, will continue   Hyponatremia  - Serum sodium 132 on admission in setting of apparent dehydration - resolved  Hypertension  - BP elevated on admission  - Resume home medications (amlodipine and benazepril)  Tobacco abuse  - Pt unable to engage in conversation at time of admission  - RN asked to provide smoking cessation information prior to discharge     Medical Consultants:    neuro   Discharge Exam:   Filed Vitals:   01/21/16 0809 01/21/16 1159  BP: 139/83 132/86  Pulse: 68 75  Temp: 97.5 F (36.4 C) 98.3 F (36.8 C)  Resp: 16 22   Filed Vitals:   01/21/16 0500 01/21/16 0600 01/21/16 0809 01/21/16 1159  BP: 130/84 125/81 139/83 132/86  Pulse:  68 75  Temp:   97.5 F (36.4 C) 98.3 F (36.8 C)  TempSrc:   Oral Oral  Resp: 21 22 16 22   Weight:      SpO2:   99% 98%    Gen:  NAD- family updated at bedside  The results of significant diagnostics from this hospitalization (including imaging, microbiology, ancillary and laboratory) are listed below for  reference.     Procedures and Diagnostic Studies:   Dg Chest 2 View  01/19/2016  CLINICAL DATA:  Acute onset of altered mental status. Slurred speech. Initial encounter. EXAM: CHEST  2 VIEW COMPARISON:  Chest radiograph performed 05/04/2014 FINDINGS: The lungs are well-aerated. Mild vascular congestion is noted. There is no evidence of focal opacification, pleural effusion or pneumothorax. The heart is normal in size; the mediastinal contour is within normal limits. No acute osseous abnormalities are seen. IMPRESSION: Mild vascular congestion noted.  Lungs remain grossly clear. Electronically Signed   By: Garald Balding M.D.   On: 01/19/2016 22:56   Ct Head Wo Contrast  01/19/2016  CLINICAL DATA:  Code stroke. Acute onset of slurred speech. Initial encounter. EXAM: CT HEAD WITHOUT CONTRAST TECHNIQUE: Contiguous axial images were obtained from the base of the skull through the vertex without intravenous contrast. COMPARISON:  CT of the head performed 11/10/2014, and MRI of the brain performed 10/01/2015 FINDINGS: There is no evidence of acute infarction, mass lesion, or intra- or extra-axial hemorrhage on CT. Evaluation is somewhat suboptimal due to motion artifact. Prominence of ventricles and sulci reflects mild to moderate cortical volume loss. Chronic infarcts are noted at the occipital lobes bilaterally, more prominent on the right, with associated encephalomalacia. A chronic infarct is also noted at the left parietal lobe, extending to the left basal ganglia. Mild periventricular and subcortical white matter change likely reflects small vessel ischemic microangiopathy. A small chronic lacunar infarct is noted at the right basal ganglia. The brainstem and fourth ventricle are within normal limits. No mass effect or midline shift is seen. There is no evidence of fracture; a left parietal craniotomy flap is unremarkable in appearance. The orbits are within normal limits. The paranasal sinuses and mastoid  air cells are well-aerated. No significant soft tissue abnormalities are seen. IMPRESSION: 1. No acute intracranial pathology seen on CT. 2. Mild to moderate cortical volume loss and scattered small vessel ischemic microangiopathy. 3. Chronic infarcts of the occipital lobes bilaterally, more prominent on the right, with associated encephalomalacia. Chronic infarct at the left parietal lobe extends to the left basal ganglia. Small chronic lacunar infarct at the right basal ganglia. These results were called by telephone at the time of interpretation on 01/19/2016 at 7:44 pm to Dr. Ezequiel Essex, who verbally acknowledged these results. Electronically Signed   By: Garald Balding M.D.   On: 01/19/2016 19:44   Mr Brain Wo Contrast  01/20/2016  CLINICAL DATA:  Altered mental status. New onset slurred speech and confusion. History of meningioma resection. EXAM: MRI HEAD WITHOUT CONTRAST MRA HEAD WITHOUT CONTRAST TECHNIQUE: Multiplanar, multiecho pulse sequences of the brain and surrounding structures were obtained without intravenous contrast. Angiographic images of the head were obtained using MRA technique without contrast. COMPARISON:  CT head of 01/19/2016, MRI 10/01/2015 FINDINGS: MRI HEAD FINDINGS Negative for acute infarct. Chronic hemorrhagic infarct right parietal lobe with extensive cystic encephalomalacia and enlargement of the right occipital horn. This is unchanged from prior MRI. Chronic infarct left medial parietal lobe with associated hemorrhage also unchanged. Left frontal craniotomy for meningioma  resection. There is T2 hyperintensity deep to the resection site which is stable and may be due to chronic infarction. No recurrent tumor is identified. Contrast not administered. There is a small amount of hemorrhage at the craniotomy site. Chronic lacunar infarction in the left external capsule unchanged. Chronic microvascular ischemic changes in the white matter bilaterally. Bilateral mastoid sinus  effusion similar to the prior study. Paranasal sinuses clear. Pituitary not enlarged.  Skull base normal. MRA HEAD FINDINGS Both vertebral arteries patent to the basilar. PICA, superior cerebellar, posterior cerebral arteries widely patent bilaterally. Basilar widely patent. Cavernous carotid widely patent bilaterally without significant stenosis. Anterior and middle cerebral arteries widely patent bilaterally without significant stenosis. Negative for cerebral aneurysm. IMPRESSION: Chronic hemorrhagic infarcts in the parietal lobe bilaterally right greater than left unchanged from prior studies. Additional areas of chronic small vessel ischemic changes also stable from the prior study. Postop meningioma resection left posterior frontal region unchanged from the prior study. No acute infarct Negative MRA head Electronically Signed   By: Franchot Gallo M.D.   On: 01/20/2016 18:26   Mr Jodene Nam Head/brain Wo Cm  01/20/2016  CLINICAL DATA:  Altered mental status. New onset slurred speech and confusion. History of meningioma resection. EXAM: MRI HEAD WITHOUT CONTRAST MRA HEAD WITHOUT CONTRAST TECHNIQUE: Multiplanar, multiecho pulse sequences of the brain and surrounding structures were obtained without intravenous contrast. Angiographic images of the head were obtained using MRA technique without contrast. COMPARISON:  CT head of 01/19/2016, MRI 10/01/2015 FINDINGS: MRI HEAD FINDINGS Negative for acute infarct. Chronic hemorrhagic infarct right parietal lobe with extensive cystic encephalomalacia and enlargement of the right occipital horn. This is unchanged from prior MRI. Chronic infarct left medial parietal lobe with associated hemorrhage also unchanged. Left frontal craniotomy for meningioma resection. There is T2 hyperintensity deep to the resection site which is stable and may be due to chronic infarction. No recurrent tumor is identified. Contrast not administered. There is a small amount of hemorrhage at the  craniotomy site. Chronic lacunar infarction in the left external capsule unchanged. Chronic microvascular ischemic changes in the white matter bilaterally. Bilateral mastoid sinus effusion similar to the prior study. Paranasal sinuses clear. Pituitary not enlarged.  Skull base normal. MRA HEAD FINDINGS Both vertebral arteries patent to the basilar. PICA, superior cerebellar, posterior cerebral arteries widely patent bilaterally. Basilar widely patent. Cavernous carotid widely patent bilaterally without significant stenosis. Anterior and middle cerebral arteries widely patent bilaterally without significant stenosis. Negative for cerebral aneurysm. IMPRESSION: Chronic hemorrhagic infarcts in the parietal lobe bilaterally right greater than left unchanged from prior studies. Additional areas of chronic small vessel ischemic changes also stable from the prior study. Postop meningioma resection left posterior frontal region unchanged from the prior study. No acute infarct Negative MRA head Electronically Signed   By: Franchot Gallo M.D.   On: 01/20/2016 18:26     Labs:   Basic Metabolic Panel:  Recent Labs Lab 01/19/16 1920 01/19/16 1932 01/21/16 0344  NA 132* 135 137  K 3.8 3.9 3.6  CL 98* 96* 104  CO2 27  --  24  GLUCOSE 130* 128* 91  BUN 8 6 6   CREATININE 0.54 0.60 0.56  CALCIUM 9.0  --  9.4   GFR CrCl cannot be calculated (Unknown ideal weight.). Liver Function Tests:  Recent Labs Lab 01/19/16 1920  AST 14*  ALT 13*  ALKPHOS 62  BILITOT 0.3  PROT 7.7  ALBUMIN 4.4   No results for input(s): LIPASE, AMYLASE in the last  168 hours. No results for input(s): AMMONIA in the last 168 hours. Coagulation profile  Recent Labs Lab 01/19/16 1920  INR 0.96    CBC:  Recent Labs Lab 01/19/16 1920 01/19/16 1932 01/21/16 0344  WBC 8.8  --  6.8  NEUTROABS 4.6  --   --   HGB 12.8 13.6 11.8*  HCT 36.9 40.0 35.0*  MCV 89.6  --  87.9  PLT 271  --  266   Cardiac Enzymes: No  results for input(s): CKTOTAL, CKMB, CKMBINDEX, TROPONINI in the last 168 hours. BNP: Invalid input(s): POCBNP CBG:  Recent Labs Lab 01/20/16 0747 01/20/16 1129 01/20/16 2355 01/21/16 0814 01/21/16 1203  GLUCAP 108* 101* 96 100* 100*   D-Dimer No results for input(s): DDIMER in the last 72 hours. Hgb A1c  Recent Labs  01/19/16 1920  HGBA1C 5.9*   Lipid Profile No results for input(s): CHOL, HDL, LDLCALC, TRIG, CHOLHDL, LDLDIRECT in the last 72 hours. Thyroid function studies  Recent Labs  01/19/16 2214  TSH 0.798   Anemia work up  Recent Labs  01/19/16 2214  [place]   Microbiology Recent Results (from the past 240 hour(s))  MRSA PCR Screening     Status: None   Collection Time: 01/20/16 12:27 AM  Result Value Ref Range Status   MRSA by PCR NEGATIVE NEGATIVE Final    Comment:        The GeneXpert MRSA Assay (FDA approved for NASAL specimens only), is one component of a comprehensive MRSA colonization surveillance program. It is not intended to diagnose MRSA infection nor to guide or monitor treatment for MRSA infections.      Discharge Instructions:       Discharge Instructions    Discharge instructions    Complete by:  As directed   dys 3 thin liquids     Increase activity slowly    Complete by:  As directed             Medication List    STOP taking these medications        amLODipine-benazepril 5-10 MG capsule  Commonly known as:  LOTREL     HYDROcodone-acetaminophen 5-325 MG tablet  Commonly known as:  NORCO/VICODIN     labetalol 100 MG tablet  Commonly known as:  NORMODYNE      TAKE these medications        amitriptyline 10 MG tablet  Commonly known as:  ELAVIL  Take 20 mg by mouth at bedtime.     aspirin EC 81 MG tablet  Take 81 mg by mouth every evening.     atorvastatin 40 MG tablet  Commonly known as:  LIPITOR  Take 40 mg by mouth every evening.     levETIRAcetam 500 MG tablet  Commonly known as:   KEPPRA  Take 1 tablet (500 mg total) by mouth 2 (two) times daily.     levothyroxine 25 MCG tablet  Commonly known as:  SYNTHROID, LEVOTHROID  Take 25 mcg by mouth daily before breakfast.     PARoxetine 10 MG tablet  Commonly known as:  PAXIL  Take 10 mg by mouth daily.     PHENobarbital 97.2 MG tablet  Commonly known as:  LUMINAL  Take 1 tablet (97.2 mg total) by mouth at bedtime.       Follow-up Information    Follow up with Maricela Curet, MD In 1 week.   Specialty:  Internal Medicine   Contact information:   [address]  [place] [address] [phone]       Follow up with [facility], KOFI, MD In 1 month.   Specialty:  Neurology   Why:  follow up seizure meds   Contact information:   [address] [place] [address] [phone]        Time coordinating discharge: 35 min  Signed:  Serjio Deupree U Rickardo Brinegar   [facility] 01/21/2016, 1:22 PM

## 2016-02-10 NOTE — Progress Notes (Signed)
   01/21/16 1004  SLP Time Calculation  SLP Start Time (ACUTE ONLY) 0945  SLP Stop Time (ACUTE ONLY) 1004  SLP Time Calculation (min) (ACUTE ONLY) 19 min  SLP G-Codes **NOT FOR INPATIENT CLASS**  Functional Assessment Tool Used skilled clinical judgement via chart review  Functional Limitations Swallowing  Swallow Current Status [id number]) CI  Swallow Goal Status [id number]) CI  Swallow Discharge Status [id number]) CI  SLP Evaluations  $ SLP Speech Visit 1 Procedure  SLP Evaluations  $BSS Swallow 1 Procedure     01/21/16 1004  SLP Time Calculation  SLP Start Time (ACUTE ONLY) 0945  SLP Stop Time (ACUTE ONLY) 1004  SLP Time Calculation (min) (ACUTE ONLY) 19 min  SLP G-Codes **NOT FOR INPATIENT CLASS**  Functional Assessment Tool Used skilled clinical judgement via chart review  Functional Limitations Swallowing  Swallow Current Status [id number]) CI  Swallow Goal Status [id number]) CI  Swallow Discharge Status [id number]) CI  SLP Evaluations  $ SLP Speech Visit 1 Procedure  SLP Evaluations  $BSS Swallow 1 Procedure  Late entry for missed G-code. Based on review of the evaluation and goals by celia bueche.  [place], [place] [phone]

## 2016-02-11 NOTE — Progress Notes (Signed)
Late Entry Addendum to the Initial Evaluation    01/20/16 1152  PT Time Calculation  PT Start Time (ACUTE ONLY) 1127  PT Stop Time (ACUTE ONLY) 1154  PT Time Calculation (min) (ACUTE ONLY) 27 min  PT G-Codes **NOT FOR INPATIENT CLASS**  Functional Assessment Tool Used clinical judgement  Functional Limitation Mobility: Walking and moving around  Mobility: Walking and Moving Around Current Status [id number]) CI  Mobility: Walking and Moving Around Goal Status [id number]) CI  PT General Charges  $$ ACUTE PT VISIT 1 Procedure  PT Evaluation  $PT Eval Moderate Complexity 1 Procedure  PT Treatments  $Gait Training 8-22 mins   02/11/2016  Donnella Sham, PT [phone]  (pager)

## 2016-10-13 ENCOUNTER — Other Ambulatory Visit (HOSPITAL_COMMUNITY): Payer: Self-pay | Admitting: Neurosurgery

## 2016-10-13 DIAGNOSIS — D329 Benign neoplasm of meninges, unspecified: Secondary | ICD-10-CM

## 2016-10-25 ENCOUNTER — Ambulatory Visit (HOSPITAL_COMMUNITY): Payer: Medicare HMO

## 2016-10-30 ENCOUNTER — Ambulatory Visit (HOSPITAL_COMMUNITY)
Admission: RE | Admit: 2016-10-30 | Discharge: 2016-10-30 | Disposition: A | Discharge status: Home / Self Care | Payer: Medicare HMO | Source: Ambulatory Visit | Attending: Neurosurgery | Admitting: Neurosurgery

## 2016-10-30 DIAGNOSIS — I6782 Cerebral ischemia: Secondary | ICD-10-CM | POA: Insufficient documentation

## 2016-10-30 DIAGNOSIS — Z9889 Other specified postprocedural states: Secondary | ICD-10-CM | POA: Diagnosis not present

## 2016-10-30 DIAGNOSIS — D329 Benign neoplasm of meninges, unspecified: Secondary | ICD-10-CM

## 2016-10-30 DIAGNOSIS — D320 Benign neoplasm of cerebral meninges: Secondary | ICD-10-CM | POA: Insufficient documentation

## 2016-10-30 LAB — POCT I-STAT CREATININE: CREATININE: 0.8 mg/dL (ref 0.44–1.00)

## 2016-10-30 MED ORDER — GADOBENATE DIMEGLUMINE 529 MG/ML IV SOLN
10.0000 mL | Freq: Once | INTRAVENOUS | Status: AC | PRN
  Administered 2016-10-30: 10 mL via INTRAVENOUS

## 2016-12-01 ENCOUNTER — Inpatient Hospital Stay (HOSPITAL_COMMUNITY)
Admission: EM | Admit: 2016-12-01 | Discharge: 2016-12-06 | DRG: 378 | Disposition: A | Discharge status: Home / Self Care | Payer: Medicare HMO | Attending: Family Medicine | Admitting: Family Medicine

## 2016-12-01 ENCOUNTER — Encounter (HOSPITAL_COMMUNITY): Payer: Self-pay | Admitting: Emergency Medicine

## 2016-12-01 ENCOUNTER — Inpatient Hospital Stay (HOSPITAL_COMMUNITY): Payer: Medicare HMO

## 2016-12-01 DIAGNOSIS — I10 Essential (primary) hypertension: Secondary | ICD-10-CM | POA: Diagnosis present

## 2016-12-01 DIAGNOSIS — Z8673 Personal history of transient ischemic attack (TIA), and cerebral infarction without residual deficits: Secondary | ICD-10-CM

## 2016-12-01 DIAGNOSIS — I69322 Dysarthria following cerebral infarction: Secondary | ICD-10-CM

## 2016-12-01 DIAGNOSIS — Z7982 Long term (current) use of aspirin: Secondary | ICD-10-CM | POA: Diagnosis not present

## 2016-12-01 DIAGNOSIS — K5731 Diverticulosis of large intestine without perforation or abscess with bleeding: Secondary | ICD-10-CM | POA: Diagnosis present

## 2016-12-01 DIAGNOSIS — D329 Benign neoplasm of meninges, unspecified: Secondary | ICD-10-CM

## 2016-12-01 DIAGNOSIS — Z79899 Other long term (current) drug therapy: Secondary | ICD-10-CM | POA: Diagnosis not present

## 2016-12-01 DIAGNOSIS — R479 Unspecified speech disturbances: Secondary | ICD-10-CM | POA: Diagnosis present

## 2016-12-01 DIAGNOSIS — E031 Congenital hypothyroidism without goiter: Secondary | ICD-10-CM

## 2016-12-01 DIAGNOSIS — E038 Other specified hypothyroidism: Secondary | ICD-10-CM | POA: Diagnosis not present

## 2016-12-01 DIAGNOSIS — R471 Dysarthria and anarthria: Secondary | ICD-10-CM

## 2016-12-01 DIAGNOSIS — F17210 Nicotine dependence, cigarettes, uncomplicated: Secondary | ICD-10-CM | POA: Diagnosis present

## 2016-12-01 DIAGNOSIS — R4701 Aphasia: Secondary | ICD-10-CM | POA: Diagnosis present

## 2016-12-01 DIAGNOSIS — D62 Acute posthemorrhagic anemia: Secondary | ICD-10-CM | POA: Diagnosis not present

## 2016-12-01 DIAGNOSIS — K922 Gastrointestinal hemorrhage, unspecified: Secondary | ICD-10-CM | POA: Diagnosis present

## 2016-12-01 DIAGNOSIS — E039 Hypothyroidism, unspecified: Secondary | ICD-10-CM | POA: Diagnosis present

## 2016-12-01 DIAGNOSIS — G40909 Epilepsy, unspecified, not intractable, without status epilepticus: Secondary | ICD-10-CM | POA: Diagnosis not present

## 2016-12-01 HISTORY — DX: Gastrointestinal hemorrhage, unspecified: K92.2

## 2016-12-01 LAB — CBC
HCT: 30.1 % — ABNORMAL LOW (ref 36.0–46.0)
HCT: 27.6 % — ABNORMAL LOW (ref 36.0–46.0)
HEMOGLOBIN: 9.5 g/dL — AB (ref 12.0–15.0)
Hemoglobin: 10.3 g/dL — ABNORMAL LOW (ref 12.0–15.0)
MCH: 31.2 pg (ref 26.0–34.0)
MCH: 31.1 pg (ref 26.0–34.0)
MCHC: 34.2 g/dL (ref 30.0–36.0)
MCHC: 34.4 g/dL (ref 30.0–36.0)
MCV: 91.2 fL (ref 78.0–100.0)
MCV: 90.5 fL (ref 78.0–100.0)
PLATELETS: 206 10*3/uL (ref 150–400)
Platelets: 197 10*3/uL (ref 150–400)
RBC: 3.3 MIL/uL — AB (ref 3.87–5.11)
RBC: 3.05 MIL/uL — AB (ref 3.87–5.11)
RDW: 14.6 % (ref 11.5–15.5)
RDW: 14.6 % (ref 11.5–15.5)
WBC: 8.3 10*3/uL (ref 4.0–10.5)
WBC: 8.8 10*3/uL (ref 4.0–10.5)

## 2016-12-01 LAB — PROTIME-INR
INR: 1.09
Prothrombin Time: 14.2 seconds (ref 11.4–15.2)

## 2016-12-01 LAB — COMPREHENSIVE METABOLIC PANEL
ALK PHOS: 59 U/L (ref 38–126)
ALT: 9 U/L — AB (ref 14–54)
AST: 13 U/L — ABNORMAL LOW (ref 15–41)
Albumin: 3.5 g/dL (ref 3.5–5.0)
Anion gap: 6 (ref 5–15)
BILIRUBIN TOTAL: 0.2 mg/dL — AB (ref 0.3–1.2)
BUN: 12 mg/dL (ref 6–20)
CALCIUM: 8.2 mg/dL — AB (ref 8.9–10.3)
CO2: 26 mmol/L (ref 22–32)
CREATININE: 0.72 mg/dL (ref 0.44–1.00)
Chloride: 108 mmol/L (ref 101–111)
Glucose, Bld: 121 mg/dL — ABNORMAL HIGH (ref 65–99)
Potassium: 3.7 mmol/L (ref 3.5–5.1)
Sodium: 140 mmol/L (ref 135–145)
TOTAL PROTEIN: 6.7 g/dL (ref 6.5–8.1)

## 2016-12-01 LAB — APTT: aPTT: 25 seconds (ref 24–36)

## 2016-12-01 LAB — POC OCCULT BLOOD, ED: Fecal Occult Bld: POSITIVE — AB

## 2016-12-01 MED ORDER — ACETAMINOPHEN 325 MG PO TABS: 650.0000 mg | ORAL_TABLET | Freq: Four times a day (QID) | ORAL | Status: DC | PRN

## 2016-12-01 MED ORDER — IOPAMIDOL (ISOVUE-300) INJECTION 61%
INTRAVENOUS | Status: AC
  Administered 2016-12-01: 30 mL
  Filled 2016-12-01: qty 30

## 2016-12-01 MED ORDER — PAROXETINE HCL 20 MG PO TABS
10.0000 mg | ORAL_TABLET | Freq: Every day | ORAL | Status: DC
  Administered 2016-12-01 – 2016-12-06 (×5): 10 mg via ORAL
  Filled 2016-12-01 (×6): qty 1

## 2016-12-01 MED ORDER — ONDANSETRON HCL 4 MG PO TABS: 4.0000 mg | ORAL_TABLET | Freq: Four times a day (QID) | ORAL | Status: DC | PRN

## 2016-12-01 MED ORDER — LEVETIRACETAM 500 MG PO TABS
500.0000 mg | ORAL_TABLET | Freq: Two times a day (BID) | ORAL | Status: DC
  Administered 2016-12-01 – 2016-12-06 (×9): 500 mg via ORAL
  Filled 2016-12-01 (×10): qty 1

## 2016-12-01 MED ORDER — ATORVASTATIN CALCIUM 40 MG PO TABS
40.0000 mg | ORAL_TABLET | Freq: Every evening | ORAL | Status: DC
  Administered 2016-12-01 – 2016-12-05 (×4): 40 mg via ORAL
  Filled 2016-12-01 (×4): qty 1

## 2016-12-01 MED ORDER — PHENOBARBITAL 97.2 MG PO TABS
97.2000 mg | ORAL_TABLET | Freq: Every day | ORAL | Status: DC
  Administered 2016-12-01 – 2016-12-05 (×5): 97.2 mg via ORAL
  Filled 2016-12-01 (×5): qty 1

## 2016-12-01 MED ORDER — IOPAMIDOL (ISOVUE-300) INJECTION 61%
100.0000 mL | Freq: Once | INTRAVENOUS | Status: AC | PRN
  Administered 2016-12-01: 100 mL via INTRAVENOUS

## 2016-12-01 MED ORDER — ONDANSETRON HCL 4 MG/2ML IJ SOLN: 4.0000 mg | Freq: Four times a day (QID) | INTRAMUSCULAR | Status: DC | PRN

## 2016-12-01 MED ORDER — LEVOTHYROXINE SODIUM 25 MCG PO TABS
25.0000 ug | ORAL_TABLET | Freq: Every day | ORAL | Status: DC
  Administered 2016-12-02 – 2016-12-06 (×4): 25 ug via ORAL
  Filled 2016-12-01 (×4): qty 1

## 2016-12-01 MED ORDER — ACETAMINOPHEN 650 MG RE SUPP
650.0000 mg | Freq: Four times a day (QID) | RECTAL | Status: DC | PRN
Start: 2016-12-01 — End: 2016-12-06

## 2016-12-01 MED ORDER — AMITRIPTYLINE HCL 10 MG PO TABS
20.0000 mg | ORAL_TABLET | Freq: Every day | ORAL | Status: DC
  Administered 2016-12-01 – 2016-12-05 (×5): 20 mg via ORAL
  Filled 2016-12-01 (×5): qty 2

## 2016-12-01 MED ORDER — SODIUM CHLORIDE 0.9 % IV BOLUS (SEPSIS)
1000.0000 mL | Freq: Once | INTRAVENOUS | Status: AC
  Administered 2016-12-01: 1000 mL via INTRAVENOUS

## 2016-12-01 NOTE — Consult Note (Signed)
Referring Provider: ER provider Primary Care Physician:  Maricela Curet, MD Primary Gastroenterologist:  Dr. Gala Romney  Date of Admission: 12/01/16 Date of Consultation: 12/01/16  Reason for Consultation:  Lower GI bleed  HPI:  [name] is a 71 y.o. female with a past medical history of multiple CVA, aphasia, htn, seizures rpesented to the ER with complaints of GI bleed. ER notes reviewed. Discussed with patient and family. She states she woke up this morning and noted bright red and dark maroon stools. Patient and family believe she's never had a colonoscopy before. She has a history of PEG placement, change, and removal as sequela of CVA. She also have some vomiting with her rectal bleeding. Denies any abdominal pain. Denies chest pain, dyspnea, dizziness, lightheadedness. No other GI complaints at this time.  Past Medical History:  Diagnosis Date  . Aphasia   . GI bleed 12/01/2016  . Hypertension   . Seizures ([place])   . Stroke [facility]) 2009, 2015 9/27, 11/2014  . Thyroid disease     Past Surgical History:  Procedure Laterality Date  . ABDOMINAL HYSTERECTOMY    . CRANIOTOMY Left 06/18/2015   Procedure: CRANIOTOMY TUMOR EXCISION w/Curve;  Surgeon: Consuella Lose, MD;  Location: [place] [facility] OR[facility];  Service: Neurosurgery;  Laterality: Left;  stereotactic Left Craniotomy for resection of meningioma  . PEG TUBE PLACEMENT    . PEG TUBE REMOVAL      Prior to Admission medications   Medication Sig Start Date End Date Taking? Authorizing Provider  amitriptyline (ELAVIL) 10 MG tablet Take 20 mg by mouth at bedtime.   Yes Historical Provider, MD  atorvastatin (LIPITOR) 40 MG tablet Take 40 mg by mouth every evening.    Yes Historical Provider, MD  levETIRAcetam (KEPPRA) 500 MG tablet Take 1 tablet (500 mg total) by mouth 2 (two) times daily. 01/21/16  Yes Geradine Girt, DO  levothyroxine (SYNTHROID, LEVOTHROID) 25 MCG tablet Take 25 mcg by mouth daily before breakfast.   Yes Historical  Provider, MD  PARoxetine (PAXIL) 10 MG tablet Take 10 mg by mouth daily.   Yes Historical Provider, MD  PHENobarbital (LUMINAL) 97.2 MG tablet Take 1 tablet (97.2 mg total) by mouth at bedtime. 11/13/14  Yes Lucia Gaskins, MD  aspirin EC 81 MG tablet Take 81 mg by mouth every evening.    Historical Provider, MD    No current facility-administered medications for this encounter.    Current Outpatient Prescriptions  Medication Sig Dispense Refill  . amitriptyline (ELAVIL) 10 MG tablet Take 20 mg by mouth at bedtime.    Marland Kitchen atorvastatin (LIPITOR) 40 MG tablet Take 40 mg by mouth every evening.     . levETIRAcetam (KEPPRA) 500 MG tablet Take 1 tablet (500 mg total) by mouth 2 (two) times daily. 60 tablet 0  . levothyroxine (SYNTHROID, LEVOTHROID) 25 MCG tablet Take 25 mcg by mouth daily before breakfast.    . PARoxetine (PAXIL) 10 MG tablet Take 10 mg by mouth daily.    Marland Kitchen PHENobarbital (LUMINAL) 97.2 MG tablet Take 1 tablet (97.2 mg total) by mouth at bedtime. 30 tablet 4  . aspirin EC 81 MG tablet Take 81 mg by mouth every evening.      Allergies as of 12/01/2016 - Review Complete 12/01/2016  Allergen Reaction Noted  . Codeine Nausea And Vomiting and Rash 05/03/2014    Family History  Problem Relation Age of Onset  . Coronary artery disease      Stents   . Stroke Father  Hemorrhagic   . Sudden death Father   . Ulcers Mother     Bleeding    Social History   Social History  . Marital status: Married    Spouse name: N/A  . Number of children: N/A  . Years of education: N/A   Occupational History  . Not on file.   Social History Main Topics  . Smoking status: Current Every Day Smoker    Packs/day: 1.50    Years: 50.00    Types: Cigarettes  . Smokeless tobacco: Never Used  . Alcohol use No  . Drug use: No  . Sexual activity: Not on file   Other Topics Concern  . Not on file   Social History Narrative  . No narrative on file    Review of Systems: General:  Negative for anorexia, weight loss, fever, chills, fatigue, weakness. ENT: Negative for hoarseness, difficulty swallowing. CV: Negative for chest pain, angina, palpitations, peripheral edema.  Respiratory: Negative for dyspnea at rest, cough, sputum, wheezing.  GI: See history of present illness. Neuro: Residual left sided weakness and expressive aphasia.  Endo: Negative for unusual weight change.  Heme: Negative for bruising or bleeding. Allergy: Negative for rash or hives.  Physical Exam: Vital signs in last 24 hours: Temp:  [97.7 F (36.5 C)] 97.7 F (36.5 C) (04/27 1153) Pulse Rate:  [69-91] 69 (04/27 1511) Resp:  [13-23] 20 (04/27 1511) BP: (113-131)/(77-94) 125/88 (04/27 1530) SpO2:  [98 %-100 %] 100 % (04/27 1511)   General:   Alert and oriented. Pleasant and cooperative. Well-nourished and well-developed. In a jovial mood. Expressive aphasia with occasionally difficult to understand speech. Head:  Normocephalic and atraumatic. Eyes:  Without icterus, sclera clear and conjunctiva pink.  Ears:  Normal auditory acuity. Cardiovascular:  S1, S2 present without murmurs appreciated. Extremities without clubbing or edema. Respiratory:  Clear to auscultation bilaterally. No wheezes, rales, or rhonchi. No distress.  Gastrointestinal:  +BS, soft, non-tender and non-distended. No HSM noted. No guarding or rebound. No masses appreciated.  Rectal:  Deferred  Musculoskalatal:  Symmetrical without gross deformities. Skin:  Intact without significant lesions or rashes. Neurologic:  Alert and oriented x4;  grossly normal neurologically. Psych:  Alert and cooperative. Normal mood and affect. Heme/Lymph/Immune: No excessive bruising noted.  Intake/Output from previous day: No intake/output data recorded. Intake/Output this shift: Total I/O In: 1000 [IV Piggyback:1000] Out: -   Lab Results:  Recent Labs  12/01/16 1217  WBC 8.3  HGB 10.3*  HCT 30.1*  PLT 206   BMET  Recent  Labs  12/01/16 1217  NA 140  K 3.7  CL 108  CO2 26  GLUCOSE 121*  BUN 12  CREATININE 0.72  CALCIUM 8.2*   LFT  Recent Labs  12/01/16 1217  PROT 6.7  ALBUMIN 3.5  AST 13*  ALT 9*  ALKPHOS 59  BILITOT 0.2*   PT/INR No results for input(s): LABPROT, INR in the last 72 hours. Hepatitis Panel No results for input(s): HEPBSAG, HCVAB, HEPAIGM, HEPBIGM in the last 72 hours. C-Diff No results for input(s): CDIFFTOX in the last 72 hours.  Studies/Results: No results found.  Impression: 71 year old pleasant female with good family support presented tot he ER with a 1 day history of lower GI bleed and nausea/vomiting. Blood described as bright red and dark maroon. No abdominal pain or other GI symptoms. No on anticoagulants other than daily aspirin. No history of bleeding prior to today, no history of colonoscopy (per patient and family;  none found in our system).  Hgb 10 months ago was 11.8 and today in the ER noted decline to 10.3. Heme stool grossly positive on ER provider rectal exam. CMP shows normal kidney/liver function. CBC without leucocytosis. Denies symptoms of anemia. Denies NSAIDs or ASA powders. Differentials include hemorrhoid bleeding, diverticular bleed, colon polyp, less likely CRC.  Plan: 1. Clear liquids today. 2. Hold ASA 3. Monitor for recurrent bleeding 4. Monitor H/H closely for the next couple days for any significant decline 5. Transfuse as necessary 6. If noted significant drop in hgb or persistent significant bleeding, consider inpatient colonoscopy 7. Will eventually need outpatient follow-up and elective colonoscopy (if not done inpatient) 8. Supportive measures.    Thank you for allowing [place] to participate in the care of Prosper, DNP, AGNP-C Adult & Gerontological Nurse Practitioner [facility]    LOS: 0 days     12/01/2016, 4:22 PM

## 2016-12-01 NOTE — ED Triage Notes (Signed)
Pt was eating breakfast and went to bathroom.  Family called EMS reporting patient having bloody bowel movements. Pt vomited twice.

## 2016-12-01 NOTE — H&P (Addendum)
History and Physical  [name] CHE:[id number] DOB: November 09, 1945 DOA: 12/01/2016  Referring physician: Dr Lacinda Axon, ED physician PCP: Maricela Curet, MD  Outpatient Specialists: none  Patient Coming From: home  Chief Complaint: Bloody stool  HPI: [name] is a 71 y.o. female with a history of CVA with residual speech disturbance on aspirin, seizure disorder, hypothyroidism, hypertension. Patient seen for rectal bleeding that started this morning. The patient had several bloody stools and came to the emergency department for evaluation. This is never occurred before. Symptoms appear to be improving, although there was still bloody stool on exam by ER doctor. She denies abdominal pain, nausea, vomiting, diarrhea. Her sister does have a history of GI bleeding as well.  Emergency Department Course: Hemoglobin was 10.3 which is down from 11.8 almost a year ago. Laboratory data otherwise normal. Patient typed and screened.  Review of Systems:   Pt denies any fevers, chills, nausea, vomiting, diarrhea, constipation, abdominal pain, shortness of breath, dyspnea on exertion, orthopnea, cough, wheezing, palpitations, headache, vision changes, lightheadedness, dizziness, melena, rectal bleeding.  Review of systems are otherwise negative  Past Medical History:  Diagnosis Date  . Aphasia   . GI bleed 12/01/2016  . Hypertension   . Seizures ([place])   . Stroke [facility]) 2009, 2015 9/27, 11/2014  . Thyroid disease    Past Surgical History:  Procedure Laterality Date  . ABDOMINAL HYSTERECTOMY    . CRANIOTOMY Left 06/18/2015   Procedure: CRANIOTOMY TUMOR EXCISION w/Curve;  Surgeon: Consuella Lose, MD;  Location: [place] NEURO ORS;  Service: Neurosurgery;  Laterality: Left;  stereotactic Left Craniotomy for resection of meningioma  . PEG TUBE PLACEMENT    . PEG TUBE REMOVAL     Social History:  reports that she has been smoking Cigarettes.  She has a 75.00 pack-year smoking history. She has  never used smokeless tobacco. She reports that she does not drink alcohol or use drugs. Patient lives at [place]  . Codeine Nausea And Vomiting and Rash    Family History  Problem Relation Age of Onset  . Coronary artery disease      Stents   . Stroke Father     Hemorrhagic   . Sudden death Father   . Ulcers Mother     Bleeding      Prior to Admission medications   Medication Sig Start Date End Date Taking? Authorizing Provider  amitriptyline (ELAVIL) 10 MG tablet Take 20 mg by mouth at bedtime.    Historical Provider, MD  aspirin EC 81 MG tablet Take 81 mg by mouth every evening.    Historical Provider, MD  atorvastatin (LIPITOR) 40 MG tablet Take 40 mg by mouth every evening.     Historical Provider, MD  levETIRAcetam (KEPPRA) 500 MG tablet Take 1 tablet (500 mg total) by mouth 2 (two) times daily. 01/21/16   Geradine Girt, DO  levothyroxine (SYNTHROID, LEVOTHROID) 25 MCG tablet Take 25 mcg by mouth daily before breakfast.    Historical Provider, MD  PARoxetine (PAXIL) 10 MG tablet Take 10 mg by mouth daily.    Historical Provider, MD  PHENobarbital (LUMINAL) 97.2 MG tablet Take 1 tablet (97.2 mg total) by mouth at bedtime. 11/13/14   Lucia Gaskins, MD    Physical Exam: BP 125/88   Pulse 69   Temp 97.7 F (36.5 C) (Oral)   Resp 20   Ht 5\' 9"  (1.753 m)   SpO2 100%   General: Elderly black female.  Awake and alert and oriented x3. No acute cardiopulmonary distress.  HEENT: Normocephalic atraumatic.  Right and left ears normal in appearance.  Pupils equal, round, reactive to light. Extraocular muscles are intact. Sclerae anicteric and noninjected.  Moist mucosal membranes. No mucosal lesions.  Neck: Neck supple without lymphadenopathy. No carotid bruits. No masses palpated.  Cardiovascular: Regular rate with normal S1-S2 sounds. No murmurs, rubs, gallops auscultated. No JVD.  Respiratory: Good respiratory effort with no wheezes, rales, rhonchi.  Lungs clear to auscultation bilaterally.  No accessory muscle use. Abdomen: Soft, nontender, nondistended. Active bowel sounds. No masses or hepatosplenomegaly  Skin: No rashes, lesions, or ulcerations.  Dry, warm to touch. 2+ dorsalis pedis and radial pulses. Musculoskeletal: No calf or leg pain. All major joints not erythematous nontender.  No upper or lower joint deformation.  Contractures of fingers Psychiatric: Intact judgment and insight. Pleasant and cooperative. Neurologic: No focal neurological deficits. Strength is 5/5 and symmetric in upper and lower extremities.  Cranial nerves II through XII are grossly intact.           Labs on Admission: I have personally reviewed following labs and imaging studies  CBC:  Recent Labs Lab 12/01/16 1217  WBC 8.3  HGB 10.3*  HCT 30.1*  MCV 91.2  PLT 093   Basic Metabolic Panel:  Recent Labs Lab 12/01/16 1217  NA 140  K 3.7  CL 108  CO2 26  GLUCOSE 121*  BUN 12  CREATININE 0.72  CALCIUM 8.2*   GFR: CrCl cannot be calculated (Unknown ideal weight.). Liver Function Tests:  Recent Labs Lab 12/01/16 1217  AST 13*  ALT 9*  ALKPHOS 59  BILITOT 0.2*  PROT 6.7  ALBUMIN 3.5   No results for input(s): LIPASE, AMYLASE in the last 168 hours. No results for input(s): AMMONIA in the last 168 hours. Coagulation Profile: No results for input(s): INR, PROTIME in the last 168 hours. Cardiac Enzymes: No results for input(s): CKTOTAL, CKMB, CKMBINDEX, TROPONINI in the last 168 hours. BNP (last 3 results) No results for input(s): PROBNP in the last 8760 hours. HbA1C: No results for input(s): HGBA1C in the last 72 hours. CBG: No results for input(s): GLUCAP in the last 168 hours. Lipid Profile: No results for input(s): CHOL, HDL, LDLCALC, TRIG, CHOLHDL, LDLDIRECT in the last 72 hours. Thyroid Function Tests: No results for input(s): TSH, T4TOTAL, FREET4, T3FREE, THYROIDAB in the last 72 hours. Anemia Panel: No results for  input(s): VITAMINB12, FOLATE, FERRITIN, TIBC, IRON, RETICCTPCT in the last 72 hours. Urine analysis:    Component Value Date/Time   COLORURINE YELLOW 01/19/2016 2230   APPEARANCEUR CLEAR 01/19/2016 2230   LABSPEC 1.015 01/19/2016 2230   PHURINE 7.5 01/19/2016 2230   GLUCOSEU NEGATIVE 01/19/2016 2230   HGBUR TRACE (A) 01/19/2016 2230   BILIRUBINUR NEGATIVE 01/19/2016 2230   KETONESUR NEGATIVE 01/19/2016 2230   PROTEINUR NEGATIVE 01/19/2016 2230   UROBILINOGEN 0.2 11/10/2014 2212   NITRITE NEGATIVE 01/19/2016 2230   LEUKOCYTESUR NEGATIVE 01/19/2016 2230   Sepsis Labs: @LABRCNTIP (procalcitonin:4,lacticidven:4) )No results found for this or any previous visit (from the past 240 hour(s)).   Radiological Exams on Admission: No results found.  Assessment/Plan: Principal Problem:   GI bleed Active Problems:   History of CVA (cerebrovascular accident)   Seizure disorder ([place])   Hypothyroidism   Speech disturbance   Acute blood loss anemia    This patient was discussed with the ED physician, including pertinent vitals, physical exam findings, labs, and imaging.  We also discussed  care given by the ED provider.  #1 GI bleed  Admit  Telemetry monitoring  Repeat hemoglobin at 1800 and benign.  Coag studies  CT with contrast of abdomen  Hold aspirin  GI to consult  Clear liquid diet #2 history of CVA  Hold aspirin. No other anticoagulation #3 seizure disorder  Continue Keppra and phenobarbital #4 hypothyroidism  Continue Synthroid #5 acute blood loss anemia  Anemia currently stable  DVT prophylaxis: SCDs Consultants: Gastroenterology Code Status: Full code Family Communication: Sister and husband  Disposition Plan: Admission   Truett Mainland, DO [facility] Pager [phone]  If 7PM-7AM, please contact night-coverage [email or web address] Password [email or web address]

## 2016-12-01 NOTE — ED Provider Notes (Addendum)
[facility] DEPT Provider Note   CSN: [id number] Arrival date & time:        History   Chief Complaint Chief Complaint  Patient presents with  . GI Bleeding    HPI [name] is a 71 y.o. female.  Level V caveat for aphasia. Family reports rectal bleeding since this morning. This was described as both regular blood and maroon stool. Vomiting 2. No fever, sweats, chills, chest pain, dyspnea.      Past Medical History:  Diagnosis Date  . Aphasia   . GI bleed 12/01/2016  . Hypertension   . Seizures ([place])   . Stroke [facility]) 2009, 2015 9/27, 11/2014  . Thyroid disease     Patient Active Problem List   Diagnosis Date Noted  . Hemi-neglect of right side   . Seizure disorder ([place]) 01/19/2016  . Hypothyroidism 01/19/2016  . Depression 01/19/2016  . Speech disturbance 01/19/2016  . Hyponatremia 01/19/2016  . Acute encephalopathy 01/19/2016  . Meningioma ([place]) 11/12/2014  . HTN (hypertension) 11/10/2014  . Tobacco use 11/10/2014  . History of CVA (cerebrovascular accident) 05/03/2014    Past Surgical History:  Procedure Laterality Date  . ABDOMINAL HYSTERECTOMY    . CRANIOTOMY Left 06/18/2015   Procedure: CRANIOTOMY TUMOR EXCISION w/Curve;  Surgeon: Consuella Lose, MD;  Location: [place] NEURO ORS;  Service: Neurosurgery;  Laterality: Left;  stereotactic Left Craniotomy for resection of meningioma  . PEG TUBE PLACEMENT    . PEG TUBE REMOVAL      OB History    No data available       Home Medications    Prior to Admission medications   Medication Sig Start Date End Date Taking? Authorizing Provider  amitriptyline (ELAVIL) 10 MG tablet Take 20 mg by mouth at bedtime.    Historical Provider, MD  aspirin EC 81 MG tablet Take 81 mg by mouth every evening.    Historical Provider, MD  atorvastatin (LIPITOR) 40 MG tablet Take 40 mg by mouth every evening.     Historical Provider, MD  levETIRAcetam (KEPPRA) 500 MG tablet Take 1 tablet (500 mg total) by mouth 2  (two) times daily. 01/21/16   Geradine Girt, DO  levothyroxine (SYNTHROID, LEVOTHROID) 25 MCG tablet Take 25 mcg by mouth daily before breakfast.    Historical Provider, MD  PARoxetine (PAXIL) 10 MG tablet Take 10 mg by mouth daily.    Historical Provider, MD  PHENobarbital (LUMINAL) 97.2 MG tablet Take 1 tablet (97.2 mg total) by mouth at bedtime. 11/13/14   Lucia Gaskins, MD    Family History Family History  Problem Relation Age of Onset  . Coronary artery disease      Stents   . Stroke Father     Hemorrhagic   . Sudden death Father   . Ulcers Mother     Bleeding    Social History Social History  Substance Use Topics  . Smoking status: Current Every Day Smoker    Packs/day: 1.50    Years: 50.00    Types: Cigarettes  . Smokeless tobacco: Never Used  . Alcohol use No     Allergies   Codeine   Review of Systems Review of Systems  Reason unable to perform ROS: aphasia.     Physical Exam Updated Vital Signs BP 125/88   Pulse 69   Temp 97.7 F (36.5 C) (Oral)   Resp 20   Ht 5\' 9"  (1.753 m)   SpO2 100%   Physical Exam  Constitutional:  She appears well-developed and well-nourished.  HENT:  Head: Normocephalic and atraumatic.  Eyes: Conjunctivae are normal.  Neck: Neck supple.  Cardiovascular: Normal rate and regular rhythm.   Pulmonary/Chest: Effort normal and breath sounds normal.  Abdominal: Soft. Bowel sounds are normal.  Genitourinary:  Genitourinary Comments: Rectal exam: No masses. Grossly heme-positive.  Musculoskeletal: Normal range of motion.  Neurological:  Difficult to understand speech.  Skin: Skin is warm and dry.  Psychiatric: She has a normal mood and affect. Her behavior is normal.  Nursing note and vitals reviewed.    ED Treatments / Results  Labs (all labs ordered are listed, but only abnormal results are displayed) Labs Reviewed  COMPREHENSIVE METABOLIC PANEL - Abnormal; Notable for the following:       Result Value   Glucose,  Bld 121 (*)    Calcium 8.2 (*)    AST 13 (*)    ALT 9 (*)    Total Bilirubin 0.2 (*)    All other components within normal limits  CBC - Abnormal; Notable for the following:    RBC 3.30 (*)    Hemoglobin 10.3 (*)    HCT 30.1 (*)    All other components within normal limits  POC OCCULT BLOOD, ED - Abnormal; Notable for the following:    Fecal Occult Bld POSITIVE (*)    All other components within normal limits  TYPE AND SCREEN    EKG  EKG Interpretation None       Radiology No results found.  Procedures Procedures (including critical care time)  Medications Ordered in ED Medications  sodium chloride 0.9 % bolus 1,000 mL (0 mLs Intravenous Stopped 12/01/16 1349)     Initial Impression / Assessment and Plan / ED Course  I have reviewed the triage vital signs and the nursing notes.  Pertinent labs & imaging results that were available during my care of the patient were reviewed by me and considered in my medical decision making (see chart for details).     Patient has obvious lower GI bleed. Her vital signs are stable. Will consult gastroenterology [Dr Rourk] and admit to general medicine.  Final Clinical Impressions(s) / ED Diagnoses   Final diagnoses:  Lower GI bleed    New Prescriptions New Prescriptions   No medications on file     Nat Christen, MD 12/01/16 [place], MD 12/01/16 1538

## 2016-12-02 LAB — CBC
HCT: 25 % — ABNORMAL LOW (ref 36.0–46.0)
HEMATOCRIT: 24.7 % — AB (ref 36.0–46.0)
Hemoglobin: 8.4 g/dL — ABNORMAL LOW (ref 12.0–15.0)
Hemoglobin: 8.5 g/dL — ABNORMAL LOW (ref 12.0–15.0)
MCH: 30.7 pg (ref 26.0–34.0)
MCH: 30.8 pg (ref 26.0–34.0)
MCHC: 34 g/dL (ref 30.0–36.0)
MCHC: 34 g/dL (ref 30.0–36.0)
MCV: 90.1 fL (ref 78.0–100.0)
MCV: 90.6 fL (ref 78.0–100.0)
PLATELETS: 192 10*3/uL (ref 150–400)
Platelets: 178 10*3/uL (ref 150–400)
RBC: 2.74 MIL/uL — ABNORMAL LOW (ref 3.87–5.11)
RBC: 2.76 MIL/uL — AB (ref 3.87–5.11)
RDW: 14.7 % (ref 11.5–15.5)
RDW: 14.7 % (ref 11.5–15.5)
WBC: 6.8 10*3/uL (ref 4.0–10.5)
WBC: 5.5 10*3/uL (ref 4.0–10.5)

## 2016-12-02 LAB — HEMOGLOBIN AND HEMATOCRIT, BLOOD
HCT: 26.4 % — ABNORMAL LOW (ref 36.0–46.0)
HEMOGLOBIN: 8.9 g/dL — AB (ref 12.0–15.0)

## 2016-12-02 MED ORDER — SODIUM CHLORIDE 0.9 % IV SOLN
INTRAVENOUS | Status: DC
  Administered 2016-12-02 – 2016-12-03 (×2): via INTRAVENOUS

## 2016-12-02 MED ORDER — PEG 3350-KCL-NA BICARB-NACL 420 G PO SOLR
4000.0000 mL | Freq: Once | ORAL | Status: AC
  Administered 2016-12-02: 4000 mL via ORAL
  Filled 2016-12-02: qty 4000

## 2016-12-02 NOTE — Progress Notes (Signed)
Patient went to bathroom, approximately 200cc of dark bloody thin stool.

## 2016-12-02 NOTE — Progress Notes (Signed)
Subjective: This patient is a 71 year old female patient of Dr. Lorriane Shire who was admitted yesterday with lower GI bleeding. She denies any bleeding since admission. Her hemoglobin is dropped 8.5. She denies any abdominal pain. Abdominal CT revealed no sign of a bleeding source.  Objective: Vital signs in last 24 hours: Vitals:   12/01/16 1832 12/01/16 2246 12/02/16 0300 12/02/16 0545  BP: (!) 145/93 125/70  116/63  Pulse: 68 72  77  Resp: 16 20  17   Temp: 98.6 F (37 C) 98.3 F (36.8 C)  98.6 F (37 C)  TempSrc: Oral Oral  Oral  SpO2: 100% 100%  99%  Weight:   110 lb (49.9 kg)   Height:   5\' 9"  (1.753 m)    Weight change:   Intake/Output Summary (Last 24 hours) at 12/02/16 1036 Last data filed at 12/02/16 0900  Gross per 24 hour  Intake             1600 ml  Output                0 ml  Net             1600 ml    Physical Exam: Alert. No distress. Pharynx moist. Lungs clear. Heart regular with no murmurs. Abdomen is soft and nontender with no hepatosplenomegaly. Extremities reveal no edema.  Lab Results:    Results for orders placed or performed during the hospital encounter of 12/01/16 (from the past 24 hour(s))  Comprehensive metabolic panel     Status: Abnormal   Collection Time: 12/01/16 12:17 PM  Result Value Ref Range   Sodium 140 135 - 145 mmol/L   Potassium 3.7 3.5 - 5.1 mmol/L   Chloride 108 101 - 111 mmol/L   CO2 26 22 - 32 mmol/L   Glucose, Bld 121 (H) 65 - 99 mg/dL   BUN 12 6 - 20 mg/dL   Creatinine, Ser 0.72 0.44 - 1.00 mg/dL   Calcium 8.2 (L) 8.9 - 10.3 mg/dL   Total Protein 6.7 6.5 - 8.1 g/dL   Albumin 3.5 3.5 - 5.0 g/dL   AST 13 (L) 15 - 41 U/L   ALT 9 (L) 14 - 54 U/L   Alkaline Phosphatase 59 38 - 126 U/L   Total Bilirubin 0.2 (L) 0.3 - 1.2 mg/dL   GFR calc non Af Amer >60 >60 mL/min   GFR calc Af Amer >60 >60 mL/min   Anion gap 6 5 - 15  CBC     Status: Abnormal   Collection Time: 12/01/16 12:17 PM  Result Value Ref Range   WBC 8.3 4.0 -  10.5 K/uL   RBC 3.30 (L) 3.87 - 5.11 MIL/uL   Hemoglobin 10.3 (L) 12.0 - 15.0 g/dL   HCT 30.1 (L) 36.0 - 46.0 %   MCV 91.2 78.0 - 100.0 fL   MCH 31.2 26.0 - 34.0 pg   MCHC 34.2 30.0 - 36.0 g/dL   RDW 14.6 11.5 - 15.5 %   Platelets 206 150 - 400 K/uL  Type and screen [facility]     Status: None   Collection Time: 12/01/16 12:17 PM  Result Value Ref Range   ABO/RH(D) A POS    Antibody Screen NEG    Sample Expiration 12/04/2016   POC occult blood, ED     Status: Abnormal   Collection Time: 12/01/16  2:28 PM  Result Value Ref Range   Fecal Occult Bld POSITIVE (A) NEGATIVE  APTT  Status: None   Collection Time: 12/01/16  6:49 PM  Result Value Ref Range   aPTT 25 24 - 36 seconds  Protime-INR     Status: None   Collection Time: 12/01/16  6:49 PM  Result Value Ref Range   Prothrombin Time 14.2 11.4 - 15.2 seconds   INR 1.09   CBC     Status: Abnormal   Collection Time: 12/01/16  6:49 PM  Result Value Ref Range   WBC 8.8 4.0 - 10.5 K/uL   RBC 3.05 (L) 3.87 - 5.11 MIL/uL   Hemoglobin 9.5 (L) 12.0 - 15.0 g/dL   HCT 27.6 (L) 36.0 - 46.0 %   MCV 90.5 78.0 - 100.0 fL   MCH 31.1 26.0 - 34.0 pg   MCHC 34.4 30.0 - 36.0 g/dL   RDW 14.6 11.5 - 15.5 %   Platelets 197 150 - 400 K/uL  CBC     Status: Abnormal   Collection Time: 12/02/16  1:30 AM  Result Value Ref Range   WBC 6.8 4.0 - 10.5 K/uL   RBC 2.74 (L) 3.87 - 5.11 MIL/uL   Hemoglobin 8.4 (L) 12.0 - 15.0 g/dL   HCT 24.7 (L) 36.0 - 46.0 %   MCV 90.1 78.0 - 100.0 fL   MCH 30.7 26.0 - 34.0 pg   MCHC 34.0 30.0 - 36.0 g/dL   RDW 14.7 11.5 - 15.5 %   Platelets 178 150 - 400 K/uL  CBC     Status: Abnormal   Collection Time: 12/02/16  7:03 AM  Result Value Ref Range   WBC 5.5 4.0 - 10.5 K/uL   RBC 2.76 (L) 3.87 - 5.11 MIL/uL   Hemoglobin 8.5 (L) 12.0 - 15.0 g/dL   HCT 25.0 (L) 36.0 - 46.0 %   MCV 90.6 78.0 - 100.0 fL   MCH 30.8 26.0 - 34.0 pg   MCHC 34.0 30.0 - 36.0 g/dL   RDW 14.7 11.5 - 15.5 %   Platelets 192 150  - 400 K/uL     ABGS No results for input(s): PHART, PO2ART, TCO2, HCO3 in the last 72 hours.  Invalid input(s): PCO2 CULTURES No results found for this or any previous visit (from the past 240 hour(s)). Studies/Results: Ct Abdomen Pelvis W Contrast  Result Date: 12/01/2016 CLINICAL DATA:  Rectal bleeding since this morning.  Vomiting. EXAM: CT ABDOMEN AND PELVIS WITH CONTRAST TECHNIQUE: Multidetector CT imaging of the abdomen and pelvis was performed using the standard protocol following bolus administration of intravenous contrast. CONTRAST:  15mL ISOVUE-300 IOPAMIDOL (ISOVUE-300) INJECTION 61%, <See Chart> ISOVUE-300 IOPAMIDOL (ISOVUE-300) INJECTION 61% COMPARISON:  None. FINDINGS: Lower chest: No acute abnormality. Hepatobiliary: There is a 13 mm stone in the gallbladder with no wall thickening or pericholecystic fluid. Mild hepatic steatosis is identified. No hepatic masses are noted. The portal vein is patent. Pancreas: Unremarkable. No pancreatic ductal dilatation or surrounding inflammatory changes. Spleen: Normal in size without focal abnormality. Adrenals/Urinary Tract: There is a nodule in the left adrenal gland on series 2, image 18 measuring 2 x 1.6 cm with an attenuation of 91 Hounsfield units. The right adrenal gland is normal. The kidneys are normal in appearance with no evidence of hydronephrosis, stone, or suspicious mass. The ureters and bladder are unremarkable. Stomach/Bowel: The stomach and small bowel are normal. A few scattered colonic diverticuli are seen without diverticulitis. The colon is otherwise unremarkable. The visualized appendix is normal. Vascular/Lymphatic: Atherosclerotic changes seen in the non aneurysmal aorta. The aorta is tortuous.  Atherosclerotic change involves the femoral and iliac vessels as well. No adenopathy. Reproductive: Status post hysterectomy. No adnexal masses. Other: No abdominal wall hernia or abnormality. No abdominopelvic ascites.  Musculoskeletal: No acute or significant osseous findings. IMPRESSION: 1. No acute abnormalities identified. 2. 13 mm stone in the gallbladder with no wall thickening or pericholecystic fluid. 3. 2 x 1.6 cm indeterminate nodule in the left adrenal gland. Consider 12 month follow-up adrenal CT for further assessment and to determine stability. 4. Diverticulosis without diverticulitis. 5. Atherosclerotic changes. Electronically Signed   By: Dorise Bullion III M.D   On: 12/01/2016 19:58   Micro Results: No results found for this or any previous visit (from the past 240 hour(s)). Studies/Results: Ct Abdomen Pelvis W Contrast  Result Date: 12/01/2016 CLINICAL DATA:  Rectal bleeding since this morning.  Vomiting. EXAM: CT ABDOMEN AND PELVIS WITH CONTRAST TECHNIQUE: Multidetector CT imaging of the abdomen and pelvis was performed using the standard protocol following bolus administration of intravenous contrast. CONTRAST:  150mL ISOVUE-300 IOPAMIDOL (ISOVUE-300) INJECTION 61%, <See Chart> ISOVUE-300 IOPAMIDOL (ISOVUE-300) INJECTION 61% COMPARISON:  None. FINDINGS: Lower chest: No acute abnormality. Hepatobiliary: There is a 13 mm stone in the gallbladder with no wall thickening or pericholecystic fluid. Mild hepatic steatosis is identified. No hepatic masses are noted. The portal vein is patent. Pancreas: Unremarkable. No pancreatic ductal dilatation or surrounding inflammatory changes. Spleen: Normal in size without focal abnormality. Adrenals/Urinary Tract: There is a nodule in the left adrenal gland on series 2, image 18 measuring 2 x 1.6 cm with an attenuation of 91 Hounsfield units. The right adrenal gland is normal. The kidneys are normal in appearance with no evidence of hydronephrosis, stone, or suspicious mass. The ureters and bladder are unremarkable. Stomach/Bowel: The stomach and small bowel are normal. A few scattered colonic diverticuli are seen without diverticulitis. The colon is otherwise  unremarkable. The visualized appendix is normal. Vascular/Lymphatic: Atherosclerotic changes seen in the non aneurysmal aorta. The aorta is tortuous. Atherosclerotic change involves the femoral and iliac vessels as well. No adenopathy. Reproductive: Status post hysterectomy. No adnexal masses. Other: No abdominal wall hernia or abnormality. No abdominopelvic ascites. Musculoskeletal: No acute or significant osseous findings. IMPRESSION: 1. No acute abnormalities identified. 2. 13 mm stone in the gallbladder with no wall thickening or pericholecystic fluid. 3. 2 x 1.6 cm indeterminate nodule in the left adrenal gland. Consider 12 month follow-up adrenal CT for further assessment and to determine stability. 4. Diverticulosis without diverticulitis. 5. Atherosclerotic changes. Electronically Signed   By: Dorise Bullion III M.D   On: 12/01/2016 19:58   Medications:  I have reviewed the patient's current medications Scheduled Meds: . amitriptyline  20 mg Oral QHS  . atorvastatin  40 mg Oral QPM  . levETIRAcetam  500 mg Oral BID  . levothyroxine  25 mcg Oral QAC breakfast  . PARoxetine  10 mg Oral Daily  . PHENobarbital  97.2 mg Oral QHS   Continuous Infusions: PRN Meds:.acetaminophen **OR** acetaminophen, ondansetron **OR** ondansetron (ZOFRAN) IV   Assessment/Plan: #1. Lower GI bleed. Initial hemoglobin was 10.3 and has gradually trended down to 8.5. Appears she will need a colonoscopy. Based on her general health status it appears this would be best performed while she is an inpatient. Will discuss with GI. Aspirin being held. #2. Seizure disorder. Continue Keppra and phenobarbital. #3. History of strokes. Principal Problem:   GI bleed Active Problems:   History of CVA (cerebrovascular accident)   Seizure disorder ([place])   Hypothyroidism  Speech disturbance   Acute blood loss anemia     LOS: 1 day   Nashaun Hillmer 12/02/2016, 10:36 AM

## 2016-12-02 NOTE — Progress Notes (Signed)
The patient remained stable overnight. No stool today per nursing staff. Hemoglobin drifted down to 8.5.   Vital signs in last 24 hours: Temp:  [97.7 F (36.5 C)-98.6 F (37 C)] 98.6 F (37 C) (04/28 0545) Pulse Rate:  [68-91] 77 (04/28 0545) Resp:  [13-23] 17 (04/28 0545) BP: (113-154)/(63-99) 116/63 (04/28 0545) SpO2:  [98 %-100 %] 99 % (04/28 0545) Weight:  [110 lb (49.9 kg)] 110 lb (49.9 kg) (04/28 0300)   General:   Alert,  pleasant and cooperative in NAD Abdomen:  Soft, nontender and nondistended.  Normal bowel sounds, without guarding, and without rebound.  No mass or organomegaly. Extremities:  Without clubbing or edema.    Intake/Output from previous day: 04/27 0701 - 04/28 0700 In: 1000 [IV Piggyback:1000] Out: -  Intake/Output this shift: Total I/O In: 600 [P.O.:600] Out: -   Lab Results:  Recent Labs  12/01/16 1849 12/02/16 0130 12/02/16 0703  WBC 8.8 6.8 5.5  HGB 9.5* 8.4* 8.5*  HCT 27.6* 24.7* 25.0*  PLT 197 178 192   BMET  Recent Labs  12/01/16 1217  NA 140  K 3.7  CL 108  CO2 26  GLUCOSE 121*  BUN 12  CREATININE 0.72  CALCIUM 8.2*   LFT  Recent Labs  12/01/16 1217  PROT 6.7  ALBUMIN 3.5  AST 13*  ALT 9*  ALKPHOS 59  BILITOT 0.2*   PT/INR  Recent Labs  12/01/16 1849  LABPROT 14.2  INR 1.09    Impression:  Pleasant 71 year old lady admitted to the hospital with hematochezia. Notable drop in hemoglobin. Remains hemodynamically stable with no further bleeding thus far today.  As best as can be determined, no prior colonoscopy.  She ought to go ahead and have further evaluation while she is here.  CT demonstrates cholelithiasis and diverticulosis. No evidence of colitis or obvious neoplastic process.   Recommendations:  I have offered the patient diagnostic colonoscopy tomorrow.The risks, benefits, limitations, alternatives and imponderables have been reviewed with the patient. Questions have been answered. All parties  are agreeable.   Further recommendations to follow.

## 2016-12-03 ENCOUNTER — Encounter (HOSPITAL_COMMUNITY)
Admission: EM | Disposition: A | Discharge status: Home / Self Care | Payer: Self-pay | Source: Home / Self Care | Attending: Family Medicine

## 2016-12-03 HISTORY — PX: COLONOSCOPY: SHX5424

## 2016-12-03 LAB — CBC
HCT: 23.9 % — ABNORMAL LOW (ref 36.0–46.0)
Hemoglobin: 8.2 g/dL — ABNORMAL LOW (ref 12.0–15.0)
MCH: 30.9 pg (ref 26.0–34.0)
MCHC: 34.3 g/dL (ref 30.0–36.0)
MCV: 90.2 fL (ref 78.0–100.0)
PLATELETS: 180 10*3/uL (ref 150–400)
RBC: 2.65 MIL/uL — ABNORMAL LOW (ref 3.87–5.11)
RDW: 14.4 % (ref 11.5–15.5)
WBC: 7 10*3/uL (ref 4.0–10.5)

## 2016-12-03 LAB — MRSA PCR SCREENING: MRSA by PCR: NEGATIVE

## 2016-12-03 LAB — HEMOGLOBIN AND HEMATOCRIT, BLOOD
HCT: 29.7 % — ABNORMAL LOW (ref 36.0–46.0)
HEMATOCRIT: 19.7 % — AB (ref 36.0–46.0)
Hemoglobin: 6.7 g/dL — CL (ref 12.0–15.0)
Hemoglobin: 10.2 g/dL — ABNORMAL LOW (ref 12.0–15.0)

## 2016-12-03 LAB — PREPARE RBC (CROSSMATCH)

## 2016-12-03 LAB — ABO/RH: ABO/RH(D): A POS

## 2016-12-03 SURGERY — COLONOSCOPY
Anesthesia: Moderate Sedation

## 2016-12-03 MED ORDER — ONDANSETRON HCL 4 MG/2ML IJ SOLN
INTRAMUSCULAR | Status: DC | PRN
  Administered 2016-12-03: 4 mg via INTRAVENOUS

## 2016-12-03 MED ORDER — MIDAZOLAM HCL 5 MG/5ML IJ SOLN
INTRAMUSCULAR | Status: AC
  Filled 2016-12-03: qty 10

## 2016-12-03 MED ORDER — MIDAZOLAM HCL 5 MG/5ML IJ SOLN
INTRAMUSCULAR | Status: DC | PRN
  Administered 2016-12-03 (×6): 1 mg via INTRAVENOUS

## 2016-12-03 MED ORDER — STERILE WATER FOR IRRIGATION IR SOLN
Status: DC | PRN
  Administered 2016-12-03: 2.5 mL

## 2016-12-03 MED ORDER — SODIUM CHLORIDE 0.9 % IV SOLN: Freq: Once | INTRAVENOUS | Status: DC

## 2016-12-03 MED ORDER — MEPERIDINE HCL 100 MG/ML IJ SOLN
INTRAMUSCULAR | Status: AC
  Filled 2016-12-03: qty 2

## 2016-12-03 MED ORDER — MEPERIDINE HCL 100 MG/ML IJ SOLN
INTRAMUSCULAR | Status: DC | PRN
  Administered 2016-12-03: 25 mg via INTRAVENOUS

## 2016-12-03 MED ORDER — ONDANSETRON HCL 4 MG/2ML IJ SOLN
INTRAMUSCULAR | Status: AC
  Filled 2016-12-03: qty 2

## 2016-12-03 NOTE — Plan of Care (Signed)
Problem: Bowel/Gastric: Goal: Will show no signs and symptoms of gastrointestinal bleeding Outcome: Progressing Pt has not had any bleeding since colonoscopy.

## 2016-12-03 NOTE — Plan of Care (Signed)
Problem: Safety: Goal: Ability to remain free from injury will improve Pt able to demonstrate correct use of call light. Bed in low position, side rails up, call bell and personal items within reach. Family at bedside.

## 2016-12-03 NOTE — Progress Notes (Signed)
Report called to ICU nurse Larene Beach

## 2016-12-03 NOTE — Progress Notes (Signed)
Hemoglobin 8.2 this morning. Nursing staff reports a large bloody bowel movement after prep this morning. She remains hemodynamically stable.  Colonoscopy to be performed this morning per plan.  The risks, benefits, limitations, alternatives and imponderables have been reviewed with the patient. Questions have been answered. All parties are agreeable.

## 2016-12-03 NOTE — Op Note (Signed)
[facility] Patient Name: [name] Procedure Date: 12/03/2016 11:22 AM MRN: [id number] Date of Birth: [birth year] Attending MD: Norvel Richards , MD CSN: [id number] Age: 71 Admit Type: Inpatient Procedure:                Ileo-colonoscopy - diagnostic Indications:              Hematochezia Providers:                Norvel Richards, MD, Charlyne Petrin RN, RN,                            Aram Candela, Randa Spike, Technician Referring MD:             Ralene Bathe. Dondiego, MD Medicines:                Midazolam 6 mg IV, Meperidine 25 mg IV, Ondansetron                            4 mg IV Complications:            No immediate complications. Estimated Blood Loss:     Estimated blood loss: none. Procedure:                Pre-Anesthesia Assessment:                           - Prior to the procedure, a History and Physical                            was performed, and patient medications and                            allergies were reviewed. The patient's tolerance of                            previous anesthesia was also reviewed. The risks                            and benefits of the procedure and the sedation                            options and risks were discussed with the patient.                            All questions were answered, and informed consent                            was obtained. Prior Anticoagulants: The patient has                            taken no previous anticoagulant or antiplatelet                            agents. ASA Grade Assessment: III - A patient with  severe systemic disease. After reviewing the risks                            and benefits, the patient was deemed in                            satisfactory condition to undergo the procedure.                           After obtaining informed consent, the colonoscope                            was passed under direct vision. Throughout the                  procedure, the patient's blood pressure, pulse, and                            oxygen saturations were monitored continuously. The                            Colonoscope was introduced through the anus and                            advanced to the 5 cm into the ileum. The quality of                            the bowel preparation was inadequate. The ileocecal                            valve, the appendiceal orifice and the rectum were                            photographed. Scope In: 11:31:48 AM Scope Out: 1:30:31 PM Scope Withdrawal Time: 1 hour 47 minutes 46 seconds  Total Procedure Duration: 1 hour 58 minutes 43 seconds  Findings:      The perianal exam was abnormal. blood and clot present in the anal       orifice. Endoscopic findings: There was much clot and bloody colonic       effluent from the rectum all the way to the cecum. Was quite a bit of       clot throughout the colon. The patient had diverticula- densely       populated from the rectosigmoid to the cecum This was a difficult       examination. This lady received basically 3 colonoscopies during this       setting. Copious amount of clot, bloody fluid was suctioned and removed       with BioVac and Roth net. Approximately 2.5 L of blood and clot removed       from the colon. Intraprocedure, hemoglobin was rechecked and found to be       6.4. One unit of packed red blood cells started during the procedure;       second ordered. Very difficult IV stick; one functioning IV utilized for       a blood transfusion. second IV eventually established.  Great pains taken to lavage the colon to see the mucosa. The patient did       have a couple scattered 5-6 mm polyps which were not manipulated. The       patient had numerous diverticula throughout the colon. However, at the       conclusion of the procedure, I was unable to find the suspect lesion.       I'm suspicious of bleeding from a tic somewhere in the  distal half of       the colon. Please note, there was no blood whatsoever in the terminal       ileum. Impression:               - Preparation of the colon was inadequate.                           - Abnormal perianal exam. Pancolonic                            diverticulosis. Much blood and clot in colon.                            Ultimately, a discreet bleeding lesion was not                            found. Diverticular bleeding from left colon?"highly                            likely. Small colon polyps not manipulated.                           - No specimens collected.                           Patient is a very poor operative candidate. Serum                            creatinine normal. Moderate Sedation:      Moderate (conscious) sedation was administered by the endoscopy nurse       and supervised by the endoscopist. The following parameters were       monitored: oxygen saturation, heart rate, blood pressure, respiratory       rate, EKG, adequacy of pulmonary ventilation, and response to care.       Total physician intraservice time was 121 minutes. Recommendation:           - Clear liquid diet.                           - Continue present medications. Transfusion total                            of 2 units of packed red blood cells now; one-hour                            posttransfusion H&H. Keep 2 units typed and crossed  at all times.                           - Transfer patient to ICU for observation. If                            evidence of rebleeding, I recommend transfer to IR                            for angiography. Avoid all antiplatelet therapy for                            the foreseeable future.                           - Refer to an interventional radiologist if                            symptoms persist. I have discussed my findings and                            recommendations with both Dr. Willey Blade and the                             patient's husband.                           - Consider elective surveillance colonoscopy at a                            later date if benefits outweigh the risks. Procedure Code(s):        --- Professional ---                           [phone], Colonoscopy, flexible; diagnostic, including                            collection of specimen(s) by brushing or washing,                            when performed (separate procedure) Diagnosis Code(s):        --- Professional ---                           K92.1, Melena (includes Hematochezia) CPT copyright 2016 [employer]. All rights reserved. The codes documented in this report are preliminary and upon coder review may  be revised to meet current compliance requirements. Cristopher Estimable. Noah Lembke, MD Norvel Richards, MD 12/03/2016 2:15:02 PM This report has been signed electronically. Number of Addenda: 0

## 2016-12-03 NOTE — Progress Notes (Signed)
Subjective: [name] experienced some additional rectal bleeding yesterday. She has been hemodynamically stable her hemoglobin is 8.2. She has taken her prepped for colonoscopy today. She denies any abdominal pain..  Objective: Vital signs in last 24 hours: Vitals:   12/02/16 0545 12/02/16 1300 12/02/16 2100 12/03/16 0538  BP: 116/63 133/82 (!) 148/83 123/72  Pulse: 77 79 91 80  Resp: 17 18 18 16   Temp: 98.6 F (37 C) 98.3 F (36.8 C) 97.4 F (36.3 C) 98.8 F (37.1 C)  TempSrc: Oral Oral Oral Oral  SpO2: 99% 100% 100% 98%  Weight:      Height:       Weight change:   Intake/Output Summary (Last 24 hours) at 12/03/16 1026 Last data filed at 12/03/16 0900  Gross per 24 hour  Intake             1046 ml  Output                8 ml  Net             1038 ml    Physical Exam: Alert. Comfortable appearing. Lungs clear. Heart regular with no murmurs. Abdomen soft and nontender. Extremities reveal no edema.  Lab Results:    Results for orders placed or performed during the hospital encounter of 12/01/16 (from the past 24 hour(s))  Hemoglobin and hematocrit, blood     Status: Abnormal   Collection Time: 12/02/16  4:06 PM  Result Value Ref Range   Hemoglobin 8.9 (L) 12.0 - 15.0 g/dL   HCT 26.4 (L) 36.0 - 46.0 %  CBC     Status: Abnormal   Collection Time: 12/03/16 12:25 AM  Result Value Ref Range   WBC 7.0 4.0 - 10.5 K/uL   RBC 2.65 (L) 3.87 - 5.11 MIL/uL   Hemoglobin 8.2 (L) 12.0 - 15.0 g/dL   HCT 23.9 (L) 36.0 - 46.0 %   MCV 90.2 78.0 - 100.0 fL   MCH 30.9 26.0 - 34.0 pg   MCHC 34.3 30.0 - 36.0 g/dL   RDW 14.4 11.5 - 15.5 %   Platelets 180 150 - 400 K/uL     ABGS No results for input(s): PHART, PO2ART, TCO2, HCO3 in the last 72 hours.  Invalid input(s): PCO2 CULTURES No results found for this or any previous visit (from the past 240 hour(s)). Studies/Results: Ct Abdomen Pelvis W Contrast  Result Date: 12/01/2016 CLINICAL DATA:  Rectal bleeding since this  morning.  Vomiting. EXAM: CT ABDOMEN AND PELVIS WITH CONTRAST TECHNIQUE: Multidetector CT imaging of the abdomen and pelvis was performed using the standard protocol following bolus administration of intravenous contrast. CONTRAST:  154mL ISOVUE-300 IOPAMIDOL (ISOVUE-300) INJECTION 61%, <See Chart> ISOVUE-300 IOPAMIDOL (ISOVUE-300) INJECTION 61% COMPARISON:  None. FINDINGS: Lower chest: No acute abnormality. Hepatobiliary: There is a 13 mm stone in the gallbladder with no wall thickening or pericholecystic fluid. Mild hepatic steatosis is identified. No hepatic masses are noted. The portal vein is patent. Pancreas: Unremarkable. No pancreatic ductal dilatation or surrounding inflammatory changes. Spleen: Normal in size without focal abnormality. Adrenals/Urinary Tract: There is a nodule in the left adrenal gland on series 2, image 18 measuring 2 x 1.6 cm with an attenuation of 91 Hounsfield units. The right adrenal gland is normal. The kidneys are normal in appearance with no evidence of hydronephrosis, stone, or suspicious mass. The ureters and bladder are unremarkable. Stomach/Bowel: The stomach and small bowel are normal. A few scattered colonic diverticuli are seen without  diverticulitis. The colon is otherwise unremarkable. The visualized appendix is normal. Vascular/Lymphatic: Atherosclerotic changes seen in the non aneurysmal aorta. The aorta is tortuous. Atherosclerotic change involves the femoral and iliac vessels as well. No adenopathy. Reproductive: Status post hysterectomy. No adnexal masses. Other: No abdominal wall hernia or abnormality. No abdominopelvic ascites. Musculoskeletal: No acute or significant osseous findings. IMPRESSION: 1. No acute abnormalities identified. 2. 13 mm stone in the gallbladder with no wall thickening or pericholecystic fluid. 3. 2 x 1.6 cm indeterminate nodule in the left adrenal gland. Consider 12 month follow-up adrenal CT for further assessment and to determine stability.  4. Diverticulosis without diverticulitis. 5. Atherosclerotic changes. Electronically Signed   By: Dorise Bullion III M.D   On: 12/01/2016 19:58   Micro Results: No results found for this or any previous visit (from the past 240 hour(s)). Studies/Results: Ct Abdomen Pelvis W Contrast  Result Date: 12/01/2016 CLINICAL DATA:  Rectal bleeding since this morning.  Vomiting. EXAM: CT ABDOMEN AND PELVIS WITH CONTRAST TECHNIQUE: Multidetector CT imaging of the abdomen and pelvis was performed using the standard protocol following bolus administration of intravenous contrast. CONTRAST:  167mL ISOVUE-300 IOPAMIDOL (ISOVUE-300) INJECTION 61%, <See Chart> ISOVUE-300 IOPAMIDOL (ISOVUE-300) INJECTION 61% COMPARISON:  None. FINDINGS: Lower chest: No acute abnormality. Hepatobiliary: There is a 13 mm stone in the gallbladder with no wall thickening or pericholecystic fluid. Mild hepatic steatosis is identified. No hepatic masses are noted. The portal vein is patent. Pancreas: Unremarkable. No pancreatic ductal dilatation or surrounding inflammatory changes. Spleen: Normal in size without focal abnormality. Adrenals/Urinary Tract: There is a nodule in the left adrenal gland on series 2, image 18 measuring 2 x 1.6 cm with an attenuation of 91 Hounsfield units. The right adrenal gland is normal. The kidneys are normal in appearance with no evidence of hydronephrosis, stone, or suspicious mass. The ureters and bladder are unremarkable. Stomach/Bowel: The stomach and small bowel are normal. A few scattered colonic diverticuli are seen without diverticulitis. The colon is otherwise unremarkable. The visualized appendix is normal. Vascular/Lymphatic: Atherosclerotic changes seen in the non aneurysmal aorta. The aorta is tortuous. Atherosclerotic change involves the femoral and iliac vessels as well. No adenopathy. Reproductive: Status post hysterectomy. No adnexal masses. Other: No abdominal wall hernia or abnormality. No  abdominopelvic ascites. Musculoskeletal: No acute or significant osseous findings. IMPRESSION: 1. No acute abnormalities identified. 2. 13 mm stone in the gallbladder with no wall thickening or pericholecystic fluid. 3. 2 x 1.6 cm indeterminate nodule in the left adrenal gland. Consider 12 month follow-up adrenal CT for further assessment and to determine stability. 4. Diverticulosis without diverticulitis. 5. Atherosclerotic changes. Electronically Signed   By: Dorise Bullion III M.D   On: 12/01/2016 19:58   Medications:  I have reviewed the patient's current medications Scheduled Meds: . amitriptyline  20 mg Oral QHS  . atorvastatin  40 mg Oral QPM  . levETIRAcetam  500 mg Oral BID  . levothyroxine  25 mcg Oral QAC breakfast  . meperidine      . midazolam      . ondansetron      . PARoxetine  10 mg Oral Daily  . PHENobarbital  97.2 mg Oral QHS   Continuous Infusions: . sodium chloride 20 mL/hr at 12/02/16 2042   PRN Meds:.acetaminophen **OR** acetaminophen, ondansetron **OR** ondansetron (ZOFRAN) IV   Assessment/Plan: #1. GI bleed. Colonoscopy today. #2. Anemia. Hemoglobin 8.2. Recheck CBC tomorrow. #3. Seizure disorder. No recent recurrence. #4. History of stroke. Aspirin being held.  Principal Problem:   GI bleed Active Problems:   History of CVA (cerebrovascular accident)   Seizure disorder ([place])   Hypothyroidism   Speech disturbance   Acute blood loss anemia     LOS: 2 days   Finian Helvey 12/03/2016, 10:26 AM

## 2016-12-04 LAB — RETICULOCYTES
RBC.: 3.77 MIL/uL — ABNORMAL LOW (ref 3.87–5.11)
RETIC COUNT ABSOLUTE: 56.6 10*3/uL (ref 19.0–186.0)
Retic Ct Pct: 1.5 % (ref 0.4–3.1)

## 2016-12-04 LAB — CBC
HEMATOCRIT: 27.9 % — AB (ref 36.0–46.0)
HEMOGLOBIN: 9.7 g/dL — AB (ref 12.0–15.0)
MCH: 30.7 pg (ref 26.0–34.0)
MCHC: 34.8 g/dL (ref 30.0–36.0)
MCV: 88.3 fL (ref 78.0–100.0)
Platelets: 145 10*3/uL — ABNORMAL LOW (ref 150–400)
RBC: 3.16 MIL/uL — ABNORMAL LOW (ref 3.87–5.11)
RDW: 14.7 % (ref 11.5–15.5)
WBC: 8.5 10*3/uL (ref 4.0–10.5)

## 2016-12-04 LAB — BPAM RBC
Blood Product Expiration Date: 201805092359
Blood Product Expiration Date: 201805202359
ISSUE DATE / TIME: 201804291608
ISSUE DATE / TIME: 201804291236
Unit Type and Rh: 6200
Unit Type and Rh: 9500

## 2016-12-04 LAB — TYPE AND SCREEN
ABO/RH(D): A POS
Antibody Screen: NEGATIVE
UNIT DIVISION: 0
Unit division: 0

## 2016-12-04 LAB — VITAMIN B12: Vitamin B-12: 313 pg/mL (ref 180–914)

## 2016-12-04 LAB — FERRITIN: FERRITIN: 36 ng/mL (ref 11–307)

## 2016-12-04 LAB — FOLATE: Folate: 9 ng/mL (ref >5.9)

## 2016-12-04 LAB — HEMOGLOBIN AND HEMATOCRIT, BLOOD
HCT: 33.6 % — ABNORMAL LOW (ref 36.0–46.0)
Hemoglobin: 11.6 g/dL — ABNORMAL LOW (ref 12.0–15.0)

## 2016-12-04 LAB — IRON AND TIBC
IRON: 52 ug/dL (ref 28–170)
SATURATION RATIOS: 20 % (ref 10.4–31.8)
TIBC: 265 ug/dL (ref 250–450)
UIBC: 213 ug/dL

## 2016-12-04 NOTE — Progress Notes (Signed)
Status post 2 units of PRBCs hemoglobin 9.7 currently status post incomplete prep for colonoscopy showing presumed diverticular bleed hemodynamically stable at present tolerating clear liquids well mass to full liquids check hemoglobin and hematocrit every 12 hours BRYAHNA LESKO [id number] DOB: 06-19-1946 DOA: 12/01/2016 PCP: Maricela Curet, MD   Physical Exam: Blood pressure (!) 146/106, pulse 78, temperature 98.3 F (36.8 C), temperature source Oral, resp. rate 19, height 5\' 3"  (1.6 m), weight 53.3 kg (117 lb 8.1 oz), SpO2 100 %. Lungs clear to A&P no rales wheeze rhonchi heart regular rhythm no murmurs gallops or rubs no S3-S4 abdomens soft bowel sounds normoactive no guarding or rebound masses no megaly   Investigations:  Recent Results (from the past 240 hour(s))  MRSA PCR Screening     Status: None   Collection Time: 12/03/16  2:30 PM  Result Value Ref Range Status   MRSA by PCR NEGATIVE NEGATIVE Final    Comment:        The GeneXpert MRSA Assay (FDA approved for NASAL specimens only), is one component of a comprehensive MRSA colonization surveillance program. It is not intended to diagnose MRSA infection nor to guide or monitor treatment for MRSA infections.      Basic Metabolic Panel: No results for input(s): NA, K, CL, CO2, GLUCOSE, BUN, CREATININE, CALCIUM, MG, PHOS in the last 72 hours. Liver Function Tests: No results for input(s): AST, ALT, ALKPHOS, BILITOT, PROT, ALBUMIN in the last 72 hours.   CBC:  Recent Labs  12/03/16 0025  12/03/16 2039 12/04/16 0506  WBC 7.0  --   --  8.5  HGB 8.2*  < > 10.2* 9.7*  HCT 23.9*  < > 29.7* 27.9*  MCV 90.2  --   --  88.3  PLT 180  --   --  145*  < > = values in this interval not displayed.  No results found.    Medications:   Impression:  Principal Problem:   GI bleed Active Problems:   History of CVA (cerebrovascular accident)   Hypothyroidism   Speech disturbance   Acute blood loss  anemia     Plan: Full liquids and hemoglobin and hematocrit every 12 hours. Monitor hemodynamics  Consultants: Gastroenterology   Procedures colonoscopy   Antibiotics:            Time spent: 30 minutes   LOS: 3 days   Maliyah Willets M   12/04/2016, 12:26 PM

## 2016-12-04 NOTE — Progress Notes (Signed)
    Subjective: Denies abdominal pain, N/V. No further overt GI bleeding. Tolerating clear liquids and would like to advance diet.   Objective: Vital signs in last 24 hours: Temp:  [97.1 F (36.2 C)-98.3 F (36.8 C)] 97.9 F (36.6 C) (04/30 0400) Pulse Rate:  [69-93] 69 (04/30 0600) Resp:  [13-25] 20 (04/30 0600) BP: (82-151)/(46-90) 110/60 (04/30 0600) SpO2:  [97 %-100 %] 99 % (04/30 0600) Weight:  [117 lb 8.1 oz (53.3 kg)] 117 lb 8.1 oz (53.3 kg) (04/30 0400) Last BM Date: 12/03/16 General:   Alert and oriented, pleasant Head:  Normocephalic and atraumatic. Eyes:  No icterus, sclera clear. Conjuctiva pink.  Abdomen:  Bowel sounds present, soft, non-tender, non-distended. No HSM or hernias noted.  Extremities:  Without  edema. Neurologic:  Alert and  oriented x4 Psych:  Alert and cooperative. Normal mood and affect.  Intake/Output from previous day: 04/29 0701 - 04/30 0700 In: 2530 [I.V.:1500; Blood:1030] Out: 500 [Urine:500] Intake/Output this shift: No intake/output data recorded.  Lab Results:  Recent Labs  12/02/16 0703  12/03/16 0025 12/03/16 1150 12/03/16 2039 12/04/16 0506  WBC 5.5  --  7.0  --   --  8.5  HGB 8.5*  < > 8.2* 6.7* 10.2* 9.7*  HCT 25.0*  < > 23.9* 19.7* 29.7* 27.9*  PLT 192  --  180  --   --  145*  < > = values in this interval not displayed. BMET  Recent Labs  12/01/16 1217  NA 140  K 3.7  CL 108  CO2 26  GLUCOSE 121*  BUN 12  CREATININE 0.72  CALCIUM 8.2*   LFT  Recent Labs  12/01/16 1217  PROT 6.7  ALBUMIN 3.5  AST 13*  ALT 9*  ALKPHOS 59  BILITOT 0.2*   PT/INR  Recent Labs  12/01/16 1849  LABPROT 14.2  INR 1.09    Assessment: 71 year old female admitted with acute lower GI bleeding, 2 units PRBCs transfusion due to acute blood loss anemia, with colonoscopy noting inadequate colon prep, pancolonic diverticulosis, and likely diverticular bleed as culprit as blood and clot noted in colon but no discrete bleeding  lesion found. She is without any further overt GI bleeding since time of colonoscopy. Hgb slightly down from yesterday to 9.7. Tolerating clear liquids and desires to advance.   Plan: Advance to full liquids and if tolerates, advance to soft diet Would need transfer to IR if further overt bleeding Avoidance of NSAIDs, antiplatelets Continue supportive measures Elective surveillance colonoscopy at later date if benefits outweigh the risks (small polyps not manipulated at time of colonoscopy)   Annitta Needs, ANP-BC [facility]    LOS: 3 days    12/04/2016, 8:36 AM

## 2016-12-05 ENCOUNTER — Encounter (HOSPITAL_COMMUNITY): Payer: Self-pay | Admitting: Internal Medicine

## 2016-12-05 LAB — HEMOGLOBIN AND HEMATOCRIT, BLOOD
HCT: 27 % — ABNORMAL LOW (ref 36.0–46.0)
HEMATOCRIT: 29.2 % — AB (ref 36.0–46.0)
Hemoglobin: 10 g/dL — ABNORMAL LOW (ref 12.0–15.0)
Hemoglobin: 9.4 g/dL — ABNORMAL LOW (ref 12.0–15.0)

## 2016-12-05 NOTE — Plan of Care (Signed)
Problem: Education: Goal: Knowledge of [facility] Education information/materials will improve Outcome: Progressing Patient has been free of pain to this point. Patient knows to ask nurse for pain medicine if she ever needs to receive pain medication. Patient knowledgeable about condition and her care.  Problem: Safety: Goal: Ability to remain free from injury will improve Outcome: Progressing Patient is free from injury at this time. Call light is within reach, belongings within reach, bed in lowest positron, bed alarm is on, and patient educated  On importance to call for help if she needs to get up or needs assistance.

## 2016-12-05 NOTE — Plan of Care (Signed)
Problem: Bowel/Gastric: Goal: Will show no signs and symptoms of gastrointestinal bleeding Outcome: Progressing Pt had stool today with no evidence of bleeding. Pt continues to get q12h H&H with hemoglobin remaining stable. Pt educated on possible discharge in am but continuing to monitor labs. Pt verbalized understanding.

## 2016-12-05 NOTE — Progress Notes (Signed)
Plan diverticular bleed hemoglobin stable patient tolerating full liquids and now regular food we'll monitor hemoglobin and hematocrit every 12 hours discharge in 48 hours if hemodynamically stable ABBAGAIL SCAFF [id number] DOB: Sep 16, 1945 DOA: 12/01/2016 PCP: Maricela Curet, MD   Physical Exam: Blood pressure 115/85, pulse 79, temperature 98.6 F (37 C), temperature source Oral, resp. rate 18, height 5\' 3"  (1.6 m), weight 53.4 kg (117 lb 11.6 oz), SpO2 100 %. Lungs clear to A&P no rales wheeze rhonchi heart rhythm no murmurs gallops heaves thrills rubs abdomen soft nontender bowel sounds normoactive   Investigations:  Recent Results (from the past 240 hour(s))  MRSA PCR Screening     Status: None   Collection Time: 12/03/16  2:30 PM  Result Value Ref Range Status   MRSA by PCR NEGATIVE NEGATIVE Final    Comment:        The GeneXpert MRSA Assay (FDA approved for NASAL specimens only), is one component of a comprehensive MRSA colonization surveillance program. It is not intended to diagnose MRSA infection nor to guide or monitor treatment for MRSA infections.      Basic Metabolic Panel: No results for input(s): NA, K, CL, CO2, GLUCOSE, BUN, CREATININE, CALCIUM, MG, PHOS in the last 72 hours. Liver Function Tests: No results for input(s): AST, ALT, ALKPHOS, BILITOT, PROT, ALBUMIN in the last 72 hours.   CBC:  Recent Labs  12/03/16 0025  12/04/16 0506  12/05/16 0031 12/05/16 1200  WBC 7.0  --  8.5  --   --   --   HGB 8.2*  < > 9.7*  < > 9.4* 10.0*  HCT 23.9*  < > 27.9*  < > 27.0* 29.2*  MCV 90.2  --  88.3  --   --   --   PLT 180  --  145*  --   --   --   < > = values in this interval not displayed.  No results found.    Medications:   Impression:  Principal Problem:   GI bleed Active Problems:   History of CVA (cerebrovascular accident)   Hypothyroidism   Speech disturbance   Acute blood loss anemia     Plan: Hemoglobin and hematocrit every 12  hours advanced to regular diet transfer to telemetry  Consultants: Gastroenterology   Procedures colonoscopy with suboptimal prep   Antibiotics:            Time spent: 30 minutes   LOS: 4 days   Rolla Servidio M   12/05/2016, 2:04 PM

## 2016-12-05 NOTE — Progress Notes (Signed)
    Subjective: Small bloody stool yesterday evening with clot, otherwise no further bleeding. Verified with nursing. No abdominal pain, N/V. Diet advanced to soft yesterday evening and will have first tray this morning for breakfast.   Objective: Vital signs in last 24 hours: Temp:  [98.3 F (36.8 C)-98.9 F (37.2 C)] 98.7 F (37.1 C) (05/01 0700) Pulse Rate:  [66-78] 67 (05/01 0700) Resp:  [17-23] 22 (05/01 0700) BP: (104-147)/(58-106) 104/58 (05/01 0700) SpO2:  [98 %-100 %] 99 % (05/01 0700) Weight:  [117 lb 11.6 oz (53.4 kg)] 117 lb 11.6 oz (53.4 kg) (05/01 0500) Last BM Date: 12/04/16 General:   Alert and oriented, pleasant Head:  Normocephalic and atraumatic. Eyes:  No icterus, sclera clear. Conjuctiva pink.  Mouth:  Without lesions, mucosa pink and moist.  Abdomen:  Bowel sounds present, soft, non-tender, non-distended. No HSM or hernias noted. No rebound or guarding. No masses appreciated  Extremities:  Withoutedema. Neurologic:  Alert and  oriented x4 Psych:  Alert and cooperative. Normal mood and affect.  Intake/Output from previous day: 04/30 0701 - 05/01 0700 In: -  Out: 853 [Urine:853] Intake/Output this shift: No intake/output data recorded.  Lab Results:  Recent Labs  12/03/16 0025  12/04/16 0506 12/04/16 1245 12/05/16 0031  WBC 7.0  --  8.5  --   --   HGB 8.2*  < > 9.7* 11.6* 9.4*  HCT 23.9*  < > 27.9* 33.6* 27.0*  PLT 180  --  145*  --   --   < > = values in this interval not displayed.  Assessment: 71 year old female admitted with acute lower GI bleeding, 2 units PRBCs transfusion due to acute blood loss anemia, with colonoscopy noting inadequate colon prep, pancolonic diverticulosis, and likely diverticular bleed as culprit as blood and clot noted in colon but no discrete bleeding lesion found. She did have one smaller bloody BM late yesterday afternoon per nursing staff and patient but none since, and her Hgb is remaining overall stable in the 9  range, similar to yesterday morning's blood draw (Hgb value was 11 at noon yesterday but remains similar when looking at overall trend). Diet has been advanced to soft.  Will need to monitor for any further significant bleeding, as she would need transfer for evaluation by IR.   Plan: Soft diet Monitor for active bleeding Avoidance of NSAIDs, antiplatelets  Elective surveillance colonoscopy at later date if benefits outweigh the risks (small polyps not manipulated at time of colonoscopy)   Annitta Needs, ANP-BC [facility]     LOS: 4 days    12/05/2016, 7:57 AM

## 2016-12-06 ENCOUNTER — Telehealth: Payer: Self-pay | Admitting: Gastroenterology

## 2016-12-06 ENCOUNTER — Encounter: Payer: Self-pay | Admitting: Internal Medicine

## 2016-12-06 LAB — HEMOGLOBIN AND HEMATOCRIT, BLOOD
HEMATOCRIT: 26.7 % — AB (ref 36.0–46.0)
HEMATOCRIT: 29.9 % — AB (ref 36.0–46.0)
HEMOGLOBIN: 10.2 g/dL — AB (ref 12.0–15.0)
Hemoglobin: 9.2 g/dL — ABNORMAL LOW (ref 12.0–15.0)

## 2016-12-06 NOTE — Telephone Encounter (Signed)
Recommend hospital follow up in 4-6 weeks with RMR, EG, AB only.

## 2016-12-06 NOTE — Telephone Encounter (Signed)
APPT MADE AND LETTER SENT TO PATIENT  °

## 2016-12-06 NOTE — Care Management Important Message (Signed)
Important Message  Patient Details  Name: [name] MRN: [id number] Date of Birth: [birth year]   Medicare Important Message Given:  Yes    Nichole Neyer, Chauncey Reading, RN 12/06/2016, 3:13 PM

## 2016-12-06 NOTE — Discharge Summary (Signed)
Physician Discharge Summary  [name] GGE:[id number] DOB: 07/08/1946 DOA: 12/01/2016  PCP: [name] Curet, MD  Admit date: 12/01/2016 Discharge date: 12/06/2016   Recommendations for Outpatient Follow-up: The patient is advised to take all medicines on her discharge medicine list including aspirin 81 mg per day and to follow-up my office on Tuesday 5 days from day of discharge to assess her hemoglobin her hemodynamics and any further GI bleed that may ensue Discharge Diagnoses:  Principal Problem:   GI bleed Active Problems:   History of CVA (cerebrovascular accident)   Hypothyroidism   Speech disturbance   Acute blood loss anemia   Discharge Condition: Good  Filed Weights   12/04/16 0400 12/05/16 0500 12/05/16 1629  Weight: 53.3 kg (117 lb 8.1 oz) 53.4 kg (117 lb 11.6 oz) 52.2 kg (115 lb 1.3 oz)    History of present illness:  The patient is 71 year old black female with a history of CVA with residual dysarthria long history of hypertension hypothyroidism presented with a lower GI bleed was admitted to the hospital transfused 2 units of packed red blood cells in ICU seen in consultation by gastroenterology they did a bowel prep and performed a colonoscopy however her bowel prep was suboptimal with days essentially reviewed was pandiverticulosis with a suboptimal prep and noted discrete bleeding source she was observed hemoglobin and hematocrit was checked every 12 hours for an additional 3-4 days her diet was advanced from clear liquids to full to regular diet and she had no significant problem nor drop in hemoglobin she was deemed clinically stable to go home on all of her prehospital medicines and follow-up my office in 5 days' time essentially neck Tuesday to check hemoglobin hemodynamics and general status  Hospital Course:  See history of present illness above  Procedures:  Colonoscopy  Consultations:  Gastroenterology  Discharge Instructions  Discharge  Instructions    Discharge instructions    Complete by:  As directed    Discharge patient    Complete by:  As directed    Discharge disposition:  01-Home or Self Care   Discharge patient date:  12/06/2016     Allergies as of 12/06/2016      Reactions   Codeine Nausea And Vomiting, Rash      Medication List    TAKE these medications   amitriptyline 10 MG tablet Commonly known as:  ELAVIL Take 20 mg by mouth at bedtime.   aspirin EC 81 MG tablet Take 81 mg by mouth every evening.   atorvastatin 40 MG tablet Commonly known as:  LIPITOR Take 40 mg by mouth every evening.   levETIRAcetam 500 MG tablet Commonly known as:  KEPPRA Take 1 tablet (500 mg total) by mouth 2 (two) times daily.   levothyroxine 25 MCG tablet Commonly known as:  SYNTHROID, LEVOTHROID Take 25 mcg by mouth daily before breakfast.   PARoxetine 10 MG tablet Commonly known as:  PAXIL Take 10 mg by mouth daily.   PHENobarbital 97.2 MG tablet Commonly known as:  LUMINAL Take 1 tablet (97.2 mg total) by mouth at bedtime.      Allergies  Allergen Reactions  . Codeine Nausea And Vomiting and Rash      The results of significant diagnostics from this hospitalization (including imaging, microbiology, ancillary and laboratory) are listed below for reference.    Significant Diagnostic Studies: Ct Abdomen Pelvis W Contrast  Result Date: 12/01/2016 CLINICAL DATA:  Rectal bleeding since this morning.  Vomiting. EXAM: CT ABDOMEN  AND PELVIS WITH CONTRAST TECHNIQUE: Multidetector CT imaging of the abdomen and pelvis was performed using the standard protocol following bolus administration of intravenous contrast. CONTRAST:  14mL ISOVUE-300 IOPAMIDOL (ISOVUE-300) INJECTION 61%, <See Chart> ISOVUE-300 IOPAMIDOL (ISOVUE-300) INJECTION 61% COMPARISON:  None. FINDINGS: Lower chest: No acute abnormality. Hepatobiliary: There is a 13 mm stone in the gallbladder with no wall thickening or pericholecystic fluid. Mild hepatic  steatosis is identified. No hepatic masses are noted. The portal vein is patent. Pancreas: Unremarkable. No pancreatic ductal dilatation or surrounding inflammatory changes. Spleen: Normal in size without focal abnormality. Adrenals/Urinary Tract: There is a nodule in the left adrenal gland on series 2, image 18 measuring 2 x 1.6 cm with an attenuation of 91 Hounsfield units. The right adrenal gland is normal. The kidneys are normal in appearance with no evidence of hydronephrosis, stone, or suspicious mass. The ureters and bladder are unremarkable. Stomach/Bowel: The stomach and small bowel are normal. A few scattered colonic diverticuli are seen without diverticulitis. The colon is otherwise unremarkable. The visualized appendix is normal. Vascular/Lymphatic: Atherosclerotic changes seen in the non aneurysmal aorta. The aorta is tortuous. Atherosclerotic change involves the femoral and iliac vessels as well. No adenopathy. Reproductive: Status post hysterectomy. No adnexal masses. Other: No abdominal wall hernia or abnormality. No abdominopelvic ascites. Musculoskeletal: No acute or significant osseous findings. IMPRESSION: 1. No acute abnormalities identified. 2. 13 mm stone in the gallbladder with no wall thickening or pericholecystic fluid. 3. 2 x 1.6 cm indeterminate nodule in the left adrenal gland. Consider 12 month follow-up adrenal CT for further assessment and to determine stability. 4. Diverticulosis without diverticulitis. 5. Atherosclerotic changes. Electronically Signed   By: Dorise Bullion III M.D   On: 12/01/2016 19:58    Microbiology: Recent Results (from the past 240 hour(s))  MRSA PCR Screening     Status: None   Collection Time: 12/03/16  2:30 PM  Result Value Ref Range Status   MRSA by PCR NEGATIVE NEGATIVE Final    Comment:        The GeneXpert MRSA Assay (FDA approved for NASAL specimens only), is one component of a comprehensive MRSA colonization surveillance program. It is  not intended to diagnose MRSA infection nor to guide or monitor treatment for MRSA infections.      Labs: Basic Metabolic Panel:  Recent Labs Lab 12/01/16 1217  NA 140  K 3.7  CL 108  CO2 26  GLUCOSE 121*  BUN 12  CREATININE 0.72  CALCIUM 8.2*   Liver Function Tests:  Recent Labs Lab 12/01/16 1217  AST 13*  ALT 9*  ALKPHOS 59  BILITOT 0.2*  PROT 6.7  ALBUMIN 3.5   No results for input(s): LIPASE, AMYLASE in the last 168 hours. No results for input(s): AMMONIA in the last 168 hours. CBC:  Recent Labs Lab 12/01/16 1849 12/02/16 0130 12/02/16 0703  12/03/16 0025  12/04/16 0506 12/04/16 1245 12/05/16 0031 12/05/16 1200 12/06/16 0017 12/06/16 1214  WBC 8.8 6.8 5.5  --  7.0  --  8.5  --   --   --   --   --   HGB 9.5* 8.4* 8.5*  < > 8.2*  < > 9.7* 11.6* 9.4* 10.0* 9.2* 10.2*  HCT 27.6* 24.7* 25.0*  < > 23.9*  < > 27.9* 33.6* 27.0* 29.2* 26.7* 29.9*  MCV 90.5 90.1 90.6  --  90.2  --  88.3  --   --   --   --   --  PLT 197 178 192  --  180  --  145*  --   --   --   --   --   < > = values in this interval not displayed. Cardiac Enzymes: No results for input(s): CKTOTAL, CKMB, CKMBINDEX, TROPONINI in the last 168 hours. BNP: BNP (last 3 results) No results for input(s): BNP in the last 8760 hours.  ProBNP (last 3 results) No results for input(s): PROBNP in the last 8760 hours.  CBG: No results for input(s): GLUCAP in the last 168 hours.     Signed:  Zaelyn Noack Jerilynn Mages  [facility] Pager: [phone] 12/06/2016, 1:00 PM

## 2016-12-06 NOTE — Progress Notes (Signed)
Pt d/c'd home via private automobile, accompanied by spouse and family members.  Instructed to f/u with PCP.  Has an appt scheduled for 01/09/17.   No new prescriptions were ordered, as no new medications were added.  D/C'd in stable condition.

## 2017-01-09 ENCOUNTER — Ambulatory Visit (INDEPENDENT_AMBULATORY_CARE_PROVIDER_SITE_OTHER): Payer: Medicare HMO | Admitting: Nurse Practitioner

## 2017-01-09 ENCOUNTER — Encounter: Payer: Self-pay | Admitting: Nurse Practitioner

## 2017-01-09 DIAGNOSIS — K922 Gastrointestinal hemorrhage, unspecified: Secondary | ICD-10-CM | POA: Diagnosis not present

## 2017-01-09 NOTE — Progress Notes (Signed)
Referring Provider: Lucia Gaskins, MD Primary Care Physician:  Lucia Gaskins, MD Primary GI:  Dr. Gala Romney  Chief Complaint  Patient presents with  . GI bleed    hosp f/u; had 1 epidsode of rectal bleeding (sister thinks she wiped too hard)    HPI:   [name] is a 71 y.o. female who presents For posthospitalization follow-up. The patient was admitted to the hospital from 12/01/2016 through 12/06/2016 for GI bleed. She presented to the emergency department with a lower GI bleed and received 2 units of packed red blood cells in the ICU. Colonoscopy was performed however her bowel prep was suboptimal and noted pandiverticulosis.   Full colonoscopy report was reviewed. Perianal exam normal, blood and clot in the anal orifice from the rectum all the way to the cecum with quite a lot of clot in the colon and densely populated diverticula. Approximately 2.5 L of blood and clot were removed from her colon. Her hemoglobin was found to be 6.4 during her procedure. One pack of red blood cells were transfused during the procedure and the second one ordered. A couple noted 5-6 mm polyps found but not manipulated. Unable to find suspected lesion and likely due to diverticular bleed somewhere in the distal half of her colon. No blood noted in the terminal ileum. The patient is noted to be a very poor operative candidate. The patient was transferred to the ICU for observation and recommended transfer to interventional radiology for angiography if any further bleeding noted. Avoid all antiplatelet therapy for the foreseeable future. Recommended refer to interventional radiologist symptoms persist. Consider repeat colonoscopy if and only if the benefits outweigh the risks in the future.  The patient's hemoglobin at discharge was 10.2.  Today she is accompanied by her sister/POA. Today she states she has only had one episode of minor/trivial bleeding and thinks she wiped too hard after a bowel  movement. Subjective weight loss of 11 pounds in the past 1-2 months. Her appetite is good. No significant bleeding noted since discharge. Denies abdominal pain, N/V, melena, acute changes in bowel habits. Stools consistent with [place] 4, no straining. Drinks a lot of water. Denies chest pain, dyspnea, dizziness, lightheadedness, syncope, near syncope. Denies any other upper or lower GI symptoms.  Has not had bloodwork since discharge.  Past Medical History:  Diagnosis Date  . Aphasia   . GI bleed 12/01/2016  . Hypertension   . Seizures ([place])   . Stroke [facility]) 2009, 2015 9/27, 11/2014  . Thyroid disease     Past Surgical History:  Procedure Laterality Date  . ABDOMINAL HYSTERECTOMY    . COLONOSCOPY N/A 12/03/2016   Procedure: COLONOSCOPY;  Surgeon: Daneil Dolin, MD;  [facility];  Service: Endoscopy;  Laterality: N/A;  . CRANIOTOMY Left 06/18/2015   Procedure: CRANIOTOMY TUMOR EXCISION w/Curve;  Surgeon: Consuella Lose, MD;  Location: [facility] OR[facility];  Service: Neurosurgery;  Laterality: Left;  stereotactic Left Craniotomy for resection of meningioma  . PEG TUBE PLACEMENT    . PEG TUBE REMOVAL      Current Outpatient Prescriptions  Medication Sig Dispense Refill  . amitriptyline (ELAVIL) 10 MG tablet Take 20 mg by mouth at bedtime.    Marland Kitchen aspirin EC 81 MG tablet Take 81 mg by mouth every evening.    Marland Kitchen atorvastatin (LIPITOR) 40 MG tablet Take 40 mg by mouth every evening.     . levETIRAcetam (KEPPRA) 500 MG tablet Take 1 tablet (500 mg total)  by mouth 2 (two) times daily. 60 tablet 0  . levothyroxine (SYNTHROID, LEVOTHROID) 25 MCG tablet Take 25 mcg by mouth daily before breakfast.    . PARoxetine (PAXIL) 10 MG tablet Take 10 mg by mouth daily.    Marland Kitchen PHENobarbital (LUMINAL) 97.2 MG tablet Take 1 tablet (97.2 mg total) by mouth at bedtime. 30 tablet 4   No current facility-administered medications for this visit.     Allergies as of 01/09/2017 - Review Complete 01/09/2017   Allergen Reaction Noted  . Codeine Nausea And Vomiting and Rash 05/03/2014    Family History  Problem Relation Age of Onset  . Coronary artery disease Unknown        Stents   . Stroke Father        Hemorrhagic   . Sudden death Father   . Ulcers Mother        Bleeding  . Colon cancer Neg Hx     Social History   Social History  . Marital status: Married    Spouse name: N/A  . Number of children: N/A  . Years of education: N/A   Social History Main Topics  . Smoking status: Current Every Day Smoker    Packs/day: 1.50    Years: 50.00    Types: Cigarettes  . Smokeless tobacco: Never Used  . Alcohol use No  . Drug use: No  . Sexual activity: Not Asked   Other Topics Concern  . None   Social History Narrative  . None    Review of Systems: General: Negative for anorexia, weight loss, fever, chills, fatigue, weakness. ENT: Negative for hoarseness, difficulty swallowing. CV: Negative for chest pain, angina, palpitations.  Respiratory: Negative for dyspnea at rest, wheezing. Admits cough with sputum related to smoking. GI: See history of present illness. Derm: Admits dry skin on her toes.  Endo: Negative for unusual weight change.  Heme: Negative for bruising or bleeding. Allergy: Negative for rash or hives.   Physical Exam: BP (!) 157/89   Pulse 77   Temp 98 F (36.7 C) (Oral)   Ht 5\' 6"  (1.676 m)   Wt 114 lb (51.7 kg)   BMI 18.40 kg/m  General:   Alert and oriented. Pleasant and cooperative. Well-nourished and well-developed. Difficult to understand at times. Eyes:  Without icterus, sclera clear and conjunctiva pink.  Ears:  Normal auditory acuity. Cardiovascular:  S1, S2 present without murmurs appreciated. Extremities without clubbing or edema. Respiratory:  Clear to auscultation bilaterally. No wheezes, rales, or rhonchi. No distress.  Gastrointestinal:  +BS, soft, non-tender and non-distended. No HSM noted. No guarding or rebound. No masses appreciated.   Rectal:  Deferred  Musculoskalatal:  Symmetrical without gross deformities. Neurologic:  Alert and oriented x4;  grossly normal neurologically. Heme/Lymph/Immune: No excessive bruising noted.    01/09/2017 10:11 AM   Disclaimer: This note was dictated with voice recognition software. Similar sounding words can inadvertently be transcribed and may not be corrected upon review.

## 2017-01-09 NOTE — Progress Notes (Signed)
cc'ed to pcp °

## 2017-01-09 NOTE — Assessment & Plan Note (Signed)
Hospitalized for significant lower GI bleed with 2-1/2 L clot removed from her colon. Likely diverticular bleed with no active bleeding noted from any particular diverticula at the time of her exam. Recommend outpatient surveillance colonoscopy follow-up pending risks and benefits. She is currently doing quite well. Has significant energy, has had some weight loss which is likely due to her hospitalization and acute illness. I will recheck a CBC today as she states she has not had one done since being discharged. Return for follow-up in 3 months. Call if any worsening or severe symptoms, any recurrent bleeding, weakness, chest pain, other signs of symptomatic anemia. The patient and her sister verbalized understanding.

## 2017-01-09 NOTE — Patient Instructions (Signed)
1. Have your blood test done in the next 1 or 2 days. 2. Call us if you have any more bleeding or worsening stomach/colon problems. 3. Return for follow-up in 3 months otherwise.

## 2017-01-10 LAB — CBC WITH DIFFERENTIAL/PLATELET
BASOS PCT: 0 %
Basophils Absolute: 0 cells/uL (ref 0–200)
EOS ABS: 88 {cells}/uL (ref 15–500)
EOS PCT: 1 %
HCT: 32.4 % — ABNORMAL LOW (ref 35.0–45.0)
Hemoglobin: 10.5 g/dL — ABNORMAL LOW (ref 11.7–15.5)
LYMPHS PCT: 21 %
Lymphs Abs: 1848 cells/uL (ref 850–3900)
MCH: 29.4 pg (ref 27.0–33.0)
MCHC: 32.4 g/dL (ref 32.0–36.0)
MCV: 90.8 fL (ref 80.0–100.0)
MONOS PCT: 9 %
MPV: 9.4 fL (ref 7.5–12.5)
Monocytes Absolute: 792 cells/uL (ref 200–950)
NEUTROS ABS: 6072 {cells}/uL (ref 1500–7800)
Neutrophils Relative %: 69 %
PLATELETS: 325 10*3/uL (ref 140–400)
RBC: 3.57 MIL/uL — AB (ref 3.80–5.10)
RDW: 14.4 % (ref 11.0–15.0)
WBC: 8.8 10*3/uL (ref 3.8–10.8)

## 2017-04-11 ENCOUNTER — Ambulatory Visit (INDEPENDENT_AMBULATORY_CARE_PROVIDER_SITE_OTHER): Payer: Medicare HMO | Admitting: Nurse Practitioner

## 2017-04-11 ENCOUNTER — Encounter: Payer: Self-pay | Admitting: Nurse Practitioner

## 2017-04-11 VITALS — BP 141/87 | HR 77 | Temp 97.8°F | Ht 62.0 in | Wt 108.4 lb

## 2017-04-11 DIAGNOSIS — K922 Gastrointestinal hemorrhage, unspecified: Secondary | ICD-10-CM

## 2017-04-11 NOTE — Patient Instructions (Addendum)
1. You can try Lubriderm to help with dry skin. 2. We will request your blood tests from your primary care provider. 3. Return for follow-up in 6 months. 4. Call us if you have any more bleeding or worsening symptoms before then. 5. Call if you have any questions or concerns.

## 2017-04-11 NOTE — Assessment & Plan Note (Addendum)
Patient status post admission for GI bleed. Colonoscopy found poor prep, likely diverticular bleed with no discrete bleeding lesion identified. Poor surgical candidate. Recommended consider elective colonoscopy depending on symptoms and waned benefits and risks. At this time she has not had any further bleeding that she is seen. States she does have blood tests done less than a month ago at her primary care. I will request these labs and if the CBC is not included in them I will order a CBC to check her hemoglobin. Given her high risk, no indication for elective colonoscopy at this time. Return for follow-up in 6 months.

## 2017-04-11 NOTE — Progress Notes (Signed)
cc'ed to pcp °

## 2017-04-11 NOTE — Progress Notes (Signed)
Referring Provider: Lucia Gaskins, MD Primary Care Physician:  Lucia Gaskins, MD Primary GI:  Dr. Gala Romney  Chief Complaint  Patient presents with  . GI Bleeding    HPI:   [name] is a 71 y.o. female who presents for auscultation follow-up. The patient was admitted 12/01/2016 through 12/06/2016 for lower GI bleed. She presented emergency department with lower GI bleed, given 2 units of PRBCs in the ICU. Colonoscopy was performed but bowel prep was suboptimal. Colonoscopy results below. Her hemoglobin and hematocrit was observed every 12 hours for 3-4 days, diet advanced, no significant drop in hemoglobin and deemed clinically stable to discharge at home with GI follow-up in primary care follow-up.  Colonoscopy completed 12/03/2016 which found inadequate preparation, abnormal perianal exam, pancolonic diverticulosis with much blood and clot in the colon. Discrete bleeding lesion was not found, likely diverticular bleed from the left colon. Small colon polyps not manipulated. Patient noted to be a very poor operative candidate. Recommended transfuses necessary, transfer to ICU for observation and if rebleeding then transferred IR for angiography. Avoid all antiplatelet therapy for procedure were future. Refer to interventional radiology if symptoms persist. Consider elective colonoscopy at a later date if benefits outweigh the risks.  Today she's accompanied by her sister and states she's doing well. No further bleeding. Has dry skin and wants a recommendation for moisturizer cream. Denies abdominal pain, N/V, fever, chills. Has had about 4-6 lb weight loss in the past 3 months. Denies weakness, fatigue. Has a good appetite. Denies chest pain, dyspnea, dizziness, lightheadedness, syncope, near syncope. Denies any other upper or lower GI symptoms.  Recently had EKG related to arm pain and was found to be normal (per the patient).  Past Medical History:  Diagnosis Date  . Aphasia     . GI bleed 12/01/2016  . Hypertension   . Seizures ([place])   . Stroke [facility]) 2009, 2015 9/27, 11/2014  . Thyroid disease     Past Surgical History:  Procedure Laterality Date  . ABDOMINAL HYSTERECTOMY    . COLONOSCOPY N/A 12/03/2016   Procedure: COLONOSCOPY;  Surgeon: Daneil Dolin, MD;  [facility];  Service: Endoscopy;  Laterality: N/A;  . CRANIOTOMY Left 06/18/2015   Procedure: CRANIOTOMY TUMOR EXCISION w/Curve;  Surgeon: Consuella Lose, MD;  Location: [place] NEUR[facility] ORS;  Service: Neurosurgery;  Laterality: Left;  stereotactic Left Craniotomy for resection of meningioma  . PEG TUBE PLACEMENT    . PEG TUBE REMOVAL      Current Outpatient Prescriptions  Medication Sig Dispense Refill  . amitriptyline (ELAVIL) 10 MG tablet Take 20 mg by mouth at bedtime.    Marland Kitchen aspirin EC 81 MG tablet Take 81 mg by mouth every evening.    Marland Kitchen atorvastatin (LIPITOR) 40 MG tablet Take 40 mg by mouth every evening.     . levETIRAcetam (KEPPRA) 500 MG tablet Take 1 tablet (500 mg total) by mouth 2 (two) times daily. 60 tablet 0  . levothyroxine (SYNTHROID, LEVOTHROID) 25 MCG tablet Take 25 mcg by mouth daily before breakfast.    . PARoxetine (PAXIL) 10 MG tablet Take 10 mg by mouth daily.    Marland Kitchen PHENobarbital (LUMINAL) 97.2 MG tablet Take 1 tablet (97.2 mg total) by mouth at bedtime. 30 tablet 4  . Vitamin D, Ergocalciferol, (DRISDOL) 50000 units CAPS capsule Take 50,000 Units by mouth every 7 (seven) days.     No current facility-administered medications for this visit.     Allergies as of  04/11/2017 - Review Complete 04/11/2017  Allergen Reaction Noted  . Codeine Nausea And Vomiting and Rash 05/03/2014    Family History  Problem Relation Age of Onset  . Coronary artery disease Unknown        Stents   . Stroke Father        Hemorrhagic   . Sudden death Father   . Ulcers Mother        Bleeding  . Colon cancer Sister     Social History   Social History  . Marital status: Married     Spouse name: N/A  . Number of children: N/A  . Years of education: N/A   Social History Main Topics  . Smoking status: Current Every Day Smoker    Packs/day: 1.50    Years: 50.00    Types: Cigarettes  . Smokeless tobacco: Never Used  . Alcohol use No  . Drug use: No  . Sexual activity: Not Asked   Other Topics Concern  . None   Social History Narrative  . None    Review of Systems: General: Negative for anorexia, fever, chills, fatigue, weakness. Eyes: Negative for vision changes.  ENT: Negative for hoarseness, difficulty swallowing. CV: Negative for chest pain, angina, palpitations, peripheral edema.  Respiratory: Negative for dyspnea at rest, cough, sputum, wheezing.  GI: See history of present illness. Endo: Negative for unusual weight change.  Heme: Negative for bruising or bleeding.   Physical Exam: BP (!) 141/87   Pulse 77   Temp 97.8 F (36.6 C) (Oral)   Ht 5\' 2"  (1.575 m)   Wt 108 lb 6.4 oz (49.2 kg)   BMI 19.83 kg/m  General:   Alert and oriented. Pleasant and cooperative. Well-nourished and well-developed.  Eyes:  Without icterus, sclera clear and conjunctiva pink.  Ears:  Hard of hearing. Cardiovascular:  S1, S2 present without murmurs appreciated. Extremities without clubbing or edema. Respiratory:  Clear to auscultation bilaterally. No wheezes, rales, or rhonchi. No distress.  Gastrointestinal:  +BS, soft, non-tender and non-distended. No HSM noted. No guarding or rebound. No masses appreciated.  Rectal:  Deferred  Musculoskalatal:  Symmetrical without gross deformities. Neurologic:  Alert and oriented x4;  grossly normal neurologically. Psych:  Alert and cooperative. Normal mood and affect. Heme/Lymph/Immune: No excessive bruising noted.    04/11/2017 11:27 AM   Disclaimer: This note was dictated with voice recognition software. Similar sounding words can inadvertently be transcribed and may not be corrected upon review.

## 2017-08-22 ENCOUNTER — Encounter: Payer: Self-pay | Admitting: Nurse Practitioner

## 2017-09-24 ENCOUNTER — Encounter (HOSPITAL_COMMUNITY): Payer: Self-pay | Admitting: Emergency Medicine

## 2017-09-24 ENCOUNTER — Inpatient Hospital Stay (HOSPITAL_COMMUNITY)
Admission: EM | Admit: 2017-09-24 | Discharge: 2017-09-28 | DRG: 100 | Disposition: A | Discharge status: Skilled Nursing Facility | Payer: Medicare HMO | Attending: Family Medicine | Admitting: Family Medicine

## 2017-09-24 ENCOUNTER — Observation Stay (HOSPITAL_COMMUNITY): Payer: Medicare HMO

## 2017-09-24 ENCOUNTER — Other Ambulatory Visit: Payer: Self-pay

## 2017-09-24 ENCOUNTER — Emergency Department (HOSPITAL_COMMUNITY): Payer: Medicare HMO

## 2017-09-24 DIAGNOSIS — Z90710 Acquired absence of both cervix and uterus: Secondary | ICD-10-CM

## 2017-09-24 DIAGNOSIS — R4182 Altered mental status, unspecified: Secondary | ICD-10-CM

## 2017-09-24 DIAGNOSIS — R471 Dysarthria and anarthria: Secondary | ICD-10-CM | POA: Diagnosis not present

## 2017-09-24 DIAGNOSIS — G40A01 Absence epileptic syndrome, not intractable, with status epilepticus: Principal | ICD-10-CM | POA: Diagnosis present

## 2017-09-24 DIAGNOSIS — G9340 Encephalopathy, unspecified: Secondary | ICD-10-CM | POA: Diagnosis not present

## 2017-09-24 DIAGNOSIS — Z8673 Personal history of transient ischemic attack (TIA), and cerebral infarction without residual deficits: Secondary | ICD-10-CM

## 2017-09-24 DIAGNOSIS — E039 Hypothyroidism, unspecified: Secondary | ICD-10-CM | POA: Diagnosis present

## 2017-09-24 DIAGNOSIS — G936 Cerebral edema: Secondary | ICD-10-CM | POA: Diagnosis present

## 2017-09-24 DIAGNOSIS — G9349 Other encephalopathy: Secondary | ICD-10-CM | POA: Diagnosis present

## 2017-09-24 DIAGNOSIS — G459 Transient cerebral ischemic attack, unspecified: Secondary | ICD-10-CM | POA: Diagnosis not present

## 2017-09-24 DIAGNOSIS — Z885 Allergy status to narcotic agent status: Secondary | ICD-10-CM

## 2017-09-24 DIAGNOSIS — Z823 Family history of stroke: Secondary | ICD-10-CM

## 2017-09-24 DIAGNOSIS — Z720 Tobacco use: Secondary | ICD-10-CM

## 2017-09-24 DIAGNOSIS — I151 Hypertension secondary to other renal disorders: Secondary | ICD-10-CM

## 2017-09-24 DIAGNOSIS — M24549 Contracture, unspecified hand: Secondary | ICD-10-CM | POA: Diagnosis present

## 2017-09-24 DIAGNOSIS — E032 Hypothyroidism due to medicaments and other exogenous substances: Secondary | ICD-10-CM

## 2017-09-24 DIAGNOSIS — G40901 Epilepsy, unspecified, not intractable, with status epilepticus: Secondary | ICD-10-CM | POA: Diagnosis present

## 2017-09-24 DIAGNOSIS — Z79890 Hormone replacement therapy (postmenopausal): Secondary | ICD-10-CM

## 2017-09-24 DIAGNOSIS — I639 Cerebral infarction, unspecified: Secondary | ICD-10-CM

## 2017-09-24 DIAGNOSIS — I10 Essential (primary) hypertension: Secondary | ICD-10-CM | POA: Diagnosis present

## 2017-09-24 DIAGNOSIS — N2889 Other specified disorders of kidney and ureter: Secondary | ICD-10-CM

## 2017-09-24 DIAGNOSIS — Z800 Family history of malignant neoplasm of digestive organs: Secondary | ICD-10-CM

## 2017-09-24 DIAGNOSIS — F17210 Nicotine dependence, cigarettes, uncomplicated: Secondary | ICD-10-CM | POA: Diagnosis present

## 2017-09-24 DIAGNOSIS — E785 Hyperlipidemia, unspecified: Secondary | ICD-10-CM | POA: Diagnosis present

## 2017-09-24 DIAGNOSIS — T426X5A Adverse effect of other antiepileptic and sedative-hypnotic drugs, initial encounter: Secondary | ICD-10-CM | POA: Diagnosis present

## 2017-09-24 DIAGNOSIS — Z7982 Long term (current) use of aspirin: Secondary | ICD-10-CM

## 2017-09-24 DIAGNOSIS — D329 Benign neoplasm of meninges, unspecified: Secondary | ICD-10-CM | POA: Diagnosis present

## 2017-09-24 DIAGNOSIS — J449 Chronic obstructive pulmonary disease, unspecified: Secondary | ICD-10-CM | POA: Diagnosis present

## 2017-09-24 DIAGNOSIS — G9389 Other specified disorders of brain: Secondary | ICD-10-CM | POA: Diagnosis present

## 2017-09-24 LAB — TSH: TSH: 2.294 u[IU]/mL (ref 0.350–4.500)

## 2017-09-24 LAB — BLOOD GAS, VENOUS
ACID-BASE EXCESS: 0.3 mmol/L (ref 0.0–2.0)
Bicarbonate: 24.7 mmol/L (ref 20.0–28.0)
O2 SAT: 98.9 %
Patient temperature: 37
pCO2, Ven: 39.5 mmHg — ABNORMAL LOW (ref 44.0–60.0)
pH, Ven: 7.408 (ref 7.250–7.430)
pO2, Ven: 151 mmHg — ABNORMAL HIGH (ref 32.0–45.0)

## 2017-09-24 LAB — COMPREHENSIVE METABOLIC PANEL
ALT: 10 U/L — ABNORMAL LOW (ref 14–54)
ANION GAP: 13 (ref 5–15)
AST: 15 U/L (ref 15–41)
Albumin: 4 g/dL (ref 3.5–5.0)
Alkaline Phosphatase: 52 U/L (ref 38–126)
BUN: 12 mg/dL (ref 6–20)
CHLORIDE: 107 mmol/L (ref 101–111)
CO2: 22 mmol/L (ref 22–32)
Calcium: 9.4 mg/dL (ref 8.9–10.3)
Creatinine, Ser: 0.69 mg/dL (ref 0.44–1.00)
Glucose, Bld: 119 mg/dL — ABNORMAL HIGH (ref 65–99)
POTASSIUM: 3.9 mmol/L (ref 3.5–5.1)
SODIUM: 142 mmol/L (ref 135–145)
Total Bilirubin: 0.2 mg/dL — ABNORMAL LOW (ref 0.3–1.2)
Total Protein: 8 g/dL (ref 6.5–8.1)

## 2017-09-24 LAB — I-STAT CHEM 8, ED
BUN: 13 mg/dL (ref 6–20)
CALCIUM ION: 1.13 mmol/L — AB (ref 1.15–1.40)
CHLORIDE: 107 mmol/L (ref 101–111)
Creatinine, Ser: 0.6 mg/dL (ref 0.44–1.00)
Glucose, Bld: 115 mg/dL — ABNORMAL HIGH (ref 65–99)
HCT: 36 % (ref 36.0–46.0)
HEMOGLOBIN: 12.2 g/dL (ref 12.0–15.0)
POTASSIUM: 4.5 mmol/L (ref 3.5–5.1)
SODIUM: 142 mmol/L (ref 135–145)
TCO2: 27 mmol/L (ref 22–32)

## 2017-09-24 LAB — DIFFERENTIAL
BASOS PCT: 0 %
Basophils Absolute: 0 10*3/uL (ref 0.0–0.1)
EOS ABS: 0.1 10*3/uL (ref 0.0–0.7)
Eosinophils Relative: 1 %
Lymphocytes Relative: 36 %
Lymphs Abs: 3 10*3/uL (ref 0.7–4.0)
MONO ABS: 0.8 10*3/uL (ref 0.1–1.0)
Monocytes Relative: 9 %
NEUTROS ABS: 4.5 10*3/uL (ref 1.7–7.7)
Neutrophils Relative %: 54 %

## 2017-09-24 LAB — AMMONIA: Ammonia: 27 umol/L (ref 9–35)

## 2017-09-24 LAB — CBC
HCT: 37 % (ref 36.0–46.0)
Hemoglobin: 12.2 g/dL (ref 12.0–15.0)
MCH: 30.3 pg (ref 26.0–34.0)
MCHC: 33 g/dL (ref 30.0–36.0)
MCV: 91.8 fL (ref 78.0–100.0)
PLATELETS: 228 10*3/uL (ref 150–400)
RBC: 4.03 MIL/uL (ref 3.87–5.11)
RDW: 15.7 % — AB (ref 11.5–15.5)
WBC: 8.4 10*3/uL (ref 4.0–10.5)

## 2017-09-24 LAB — URINALYSIS, ROUTINE W REFLEX MICROSCOPIC
Bilirubin Urine: NEGATIVE
GLUCOSE, UA: NEGATIVE mg/dL
Hgb urine dipstick: NEGATIVE
KETONES UR: NEGATIVE mg/dL
LEUKOCYTES UA: NEGATIVE
Nitrite: NEGATIVE
PROTEIN: NEGATIVE mg/dL
Specific Gravity, Urine: 1.008 (ref 1.005–1.030)
pH: 6 (ref 5.0–8.0)

## 2017-09-24 LAB — RAPID URINE DRUG SCREEN, HOSP PERFORMED
Amphetamines: NOT DETECTED
BARBITURATES: POSITIVE — AB
Benzodiazepines: NOT DETECTED
Cocaine: NOT DETECTED
Opiates: NOT DETECTED
TETRAHYDROCANNABINOL: NOT DETECTED

## 2017-09-24 LAB — ETHANOL

## 2017-09-24 LAB — CBG MONITORING, ED: Glucose-Capillary: 111 mg/dL — ABNORMAL HIGH (ref 65–99)

## 2017-09-24 LAB — PROTIME-INR
INR: 0.92
PROTHROMBIN TIME: 12.2 s (ref 11.4–15.2)

## 2017-09-24 LAB — APTT: APTT: 27 s (ref 24–36)

## 2017-09-24 MED ORDER — LEVOTHYROXINE SODIUM 25 MCG PO TABS
25.0000 ug | ORAL_TABLET | Freq: Every day | ORAL | Status: DC
  Administered 2017-09-26 – 2017-09-28 (×3): 25 ug via ORAL
  Filled 2017-09-24 (×4): qty 1

## 2017-09-24 MED ORDER — ATORVASTATIN CALCIUM 40 MG PO TABS
40.0000 mg | ORAL_TABLET | Freq: Every evening | ORAL | Status: DC
  Administered 2017-09-25 – 2017-09-27 (×3): 40 mg via ORAL
  Filled 2017-09-24 (×3): qty 1

## 2017-09-24 MED ORDER — LORAZEPAM 2 MG/ML IJ SOLN
1.0000 mg | Freq: Once | INTRAMUSCULAR | Status: AC
  Administered 2017-09-24: 1 mg via INTRAVENOUS
  Filled 2017-09-24: qty 1

## 2017-09-24 MED ORDER — QUETIAPINE FUMARATE 100 MG PO TABS: 300.0000 mg | ORAL_TABLET | Freq: Every day | ORAL | Status: DC

## 2017-09-24 MED ORDER — ACETAMINOPHEN 160 MG/5ML PO SOLN: 650.0000 mg | ORAL | Status: DC | PRN

## 2017-09-24 MED ORDER — HYDRALAZINE HCL 20 MG/ML IJ SOLN: 10.0000 mg | Freq: Four times a day (QID) | INTRAMUSCULAR | Status: DC | PRN

## 2017-09-24 MED ORDER — LEVETIRACETAM 100 MG/ML PO SOLN
500.0000 mg | Freq: Two times a day (BID) | ORAL | Status: DC
  Administered 2017-09-25 – 2017-09-27 (×4): 500 mg via ORAL
  Filled 2017-09-24 (×8): qty 5

## 2017-09-24 MED ORDER — ENOXAPARIN SODIUM 40 MG/0.4ML ~~LOC~~ SOLN
40.0000 mg | SUBCUTANEOUS | Status: DC
  Administered 2017-09-24 – 2017-09-27 (×4): 40 mg via SUBCUTANEOUS
  Filled 2017-09-24 (×4): qty 0.4

## 2017-09-24 MED ORDER — CHOLECALCIFEROL 10 MCG (400 UNIT) PO TABS
400.0000 [IU] | ORAL_TABLET | Freq: Every day | ORAL | Status: DC
  Administered 2017-09-26 – 2017-09-28 (×3): 400 [IU] via ORAL
  Filled 2017-09-24 (×4): qty 1

## 2017-09-24 MED ORDER — STROKE: EARLY STAGES OF RECOVERY BOOK
Freq: Once | Status: AC
  Administered 2017-09-25: 10:00:00
  Filled 2017-09-24: qty 1

## 2017-09-24 MED ORDER — ACETAMINOPHEN 325 MG PO TABS
650.0000 mg | ORAL_TABLET | ORAL | Status: DC | PRN
  Administered 2017-09-26: 650 mg via ORAL
  Filled 2017-09-24 (×2): qty 2

## 2017-09-24 MED ORDER — PAROXETINE HCL 10 MG PO TABS
10.0000 mg | ORAL_TABLET | Freq: Every day | ORAL | Status: DC
  Administered 2017-09-26 – 2017-09-28 (×3): 10 mg via ORAL
  Filled 2017-09-24 (×7): qty 1

## 2017-09-24 MED ORDER — SODIUM CHLORIDE 0.9 % IV SOLN
INTRAVENOUS | Status: DC
  Administered 2017-09-24: 19:00:00 via INTRAVENOUS

## 2017-09-24 MED ORDER — PHENOBARBITAL 97.2 MG PO TABS
97.2000 mg | ORAL_TABLET | Freq: Every day | ORAL | Status: DC
  Administered 2017-09-25 – 2017-09-27 (×3): 97.2 mg via ORAL
  Filled 2017-09-24 (×3): qty 1

## 2017-09-24 MED ORDER — HALOPERIDOL LACTATE 5 MG/ML IJ SOLN
5.0000 mg | Freq: Four times a day (QID) | INTRAMUSCULAR | Status: DC | PRN
  Administered 2017-09-25 (×2): 5 mg via INTRAVENOUS
  Filled 2017-09-24 (×2): qty 1

## 2017-09-24 MED ORDER — LEVETIRACETAM 500 MG PO TABS: 500.0000 mg | ORAL_TABLET | Freq: Two times a day (BID) | ORAL | Status: DC

## 2017-09-24 MED ORDER — ASPIRIN 300 MG RE SUPP
300.0000 mg | Freq: Every day | RECTAL | Status: DC
  Administered 2017-09-24 – 2017-09-25 (×2): 300 mg via RECTAL
  Filled 2017-09-24 (×2): qty 1

## 2017-09-24 MED ORDER — HYDRALAZINE HCL 20 MG/ML IJ SOLN
10.0000 mg | Freq: Once | INTRAMUSCULAR | Status: AC
  Administered 2017-09-24: 10 mg via INTRAVENOUS
  Filled 2017-09-24: qty 1

## 2017-09-24 MED ORDER — ASPIRIN 325 MG PO TABS
325.0000 mg | ORAL_TABLET | Freq: Every day | ORAL | Status: DC
  Administered 2017-09-26 – 2017-09-28 (×3): 325 mg via ORAL
  Filled 2017-09-24 (×4): qty 1

## 2017-09-24 MED ORDER — SENNOSIDES-DOCUSATE SODIUM 8.6-50 MG PO TABS: 1.0000 | ORAL_TABLET | Freq: Every evening | ORAL | Status: DC | PRN

## 2017-09-24 MED ORDER — ACETAMINOPHEN 650 MG RE SUPP: 650.0000 mg | RECTAL | Status: DC | PRN

## 2017-09-24 NOTE — Plan of Care (Signed)
  Progressing Coping: Level of anxiety will decrease 09/24/2017 1725 - Progressing by Berton Bon, RN

## 2017-09-24 NOTE — Care Management Obs Status (Signed)
[facility] NOTIFICATION   Patient Details  Name: [name] MRN: [id number] Date of Birth: 02/24/1946   Medicare Observation Status Notification Given:  Yes    Sherald Barge, RN 09/24/2017, 3:45 PM

## 2017-09-24 NOTE — ED Triage Notes (Addendum)
Per EMS, pt woke up and approximately 45 minutes later husband noticed slurred speech. EMS reports difficulty moving right upper extremity but seems to have resolved at time of arrival to ED. Moderate confusion noted at time of arrival. Pt repeatedly yelling "my sister has my ring." LKW 930. CBG 111.

## 2017-09-24 NOTE — Progress Notes (Signed)
Pt in MRI, unable to perform neuro checks or VS on patient.

## 2017-09-24 NOTE — ED Notes (Signed)
Unable to obtain clear history from husband as pt keeps screaming and repeating her sister took her rings.  Husband states this is an ongoing thing with her and her sister but pt not normally fixed on it.  Pt speaks to her husband while standing on her left side, but continues to ask where he is when he moves to the right.  No attention to right side of body.

## 2017-09-24 NOTE — H&P (Signed)
History and Physical  [name] KVQ:[id number] DOB: January 29, 1946 DOA: 09/24/2017   PCP: Lucia Gaskins, MD   Patient coming from: Home  Chief Complaint: Confusion, dysarthria  HPI:  [name] is a 72 y.o. female with medical history of hypertension not on medications, seizure disorder, stroke, GI bleed presenting with acute onset of dysarthria and confusion around 9:30 in the morning on 09/24/2017.  The patient has been in her usual state of health up until that point.  Presently, the patient is unable to provide any history secondary to her confusion.  All of his history is obtained from speaking with EDP, husband, and review of the medical record.  Around 9:30 AM on 09/24/2017, the patient had acute onset of numbness in her right upper extremity with associated weakness and slurred speech.  In addition, the patient was confused according to the patient's family.  Prior to EMS arrival, the patient somewhat improved.  However upon EMS arrival, the patient's dysarthria came back.  The patient was started on risperidone approximately 1 month prior to this admission, but has not been on any other new medications.  There is been no reports of fevers, chills, chest pain, shortness breath, cough, hemoptysis, nausea, vomiting, diarrhea.    In the emergency department, the patient was afebrile hemodynamically stable saturating 100% on room air.  She was hypertensive with a blood pressure up to 180/98.  BMP, LFTs, CBC were unremarkable.  Ammonia was 27.  Urinalysis was negative for pyuria.  Urine drug screen was positive for barbiturates.  Chest x-ray showed COPD without any acute findings.  CT of the brain showed chronic frontal lobe encephalomalacia.  Assessment/Plan: Dysarthria -Neurology Consult -PT/OT evaluation -Speech therapy eval -CT brain--neg for acute findings -MRI brain-- -MRA brain-- -Carotid Duplex-- -Echo-- -LDL-- -HbA1C-- -Antiplatelet--ASA 325 mg daily  Acute  Encephalopathy, type unspecified -Concerned about possible PRES, HTN encephalopathy, TIA -Serum B12 -TSH -Ammonia 27 -Alcohol level undetectable -Urinalysis negative for pyuria -Urine drug screen positive for barbiturates--likely due to phenobarbital on home med list -EEG  Elevated blood pressure without diagnosis of hypertension -Allow for permissive hypertension -Hydralazine as needed systolic blood pressure greater than 220  Hypothyroidism -Start Synthroid  Hyperlipidemia -Continue statin -Check lipid panel  Seizure disorder -Continue Keppra and phenobarbital  Tobacco abuse -Tobacco cessation discussed      Past Medical History:  Diagnosis Date  . Aphasia   . GI bleed 12/01/2016  . Hypertension   . Seizures ([place])   . Stroke [facility]) 2009, 2015 9/27, 11/2014  . Thyroid disease    Past Surgical History:  Procedure Laterality Date  . ABDOMINAL HYSTERECTOMY    . COLONOSCOPY N/A 12/03/2016   Procedure: COLONOSCOPY;  Surgeon: Daneil Dolin, MD;  [facility];  Service: Endoscopy;  Laterality: N/A;  . CRANIOTOMY Left 06/18/2015   Procedure: CRANIOTOMY TUMOR EXCISION w/Curve;  Surgeon: Consuella Lose, MD;  Location: [place] [facility] OR[facility];  Service: Neurosurgery;  Laterality: Left;  stereotactic Left Craniotomy for resection of meningioma  . PEG TUBE PLACEMENT    . PEG TUBE REMOVAL     Social History:  reports that she has been smoking cigarettes.  She has a 75.00 pack-year smoking history. she has never used smokeless tobacco. She reports that she does not drink alcohol or use drugs.   Family History  Problem Relation Age of Onset  . Coronary artery disease Unknown        Stents   . Stroke Father  Hemorrhagic   . Sudden death Father   . Ulcers Mother        Bleeding  . Colon cancer Sister      Allergies  Allergen Reactions  . Codeine Nausea And Vomiting and Rash     Prior to Admission medications   Medication Sig Start Date End Date Taking?  Authorizing Provider  amitriptyline (ELAVIL) 10 MG tablet Take 20 mg by mouth at bedtime.   Yes [provider]  aspirin EC 81 MG tablet Take 81 mg by mouth every evening.   Yes [provider]  atorvastatin (LIPITOR) 40 MG tablet Take 40 mg by mouth every evening.    Yes [provider]  cholecalciferol (VITAMIN D) 400 units TABS tablet Take 400 Units by mouth daily.   Yes [provider]  levETIRAcetam (KEPPRA) 500 MG tablet Take 1 tablet (500 mg total) by mouth 2 (two) times daily. 01/21/16  Yes Vann, Tomi Bamberger, DO  levothyroxine (SYNTHROID, LEVOTHROID) 25 MCG tablet Take 25 mcg by mouth daily before breakfast.   Yes [provider]  PARoxetine (PAXIL) 10 MG tablet Take 10 mg by mouth daily.   Yes [provider]  PHENobarbital (LUMINAL) 97.2 MG tablet Take 1 tablet (97.2 mg total) by mouth at bedtime. 11/13/14  Yes Dondiego, Delfino Lovett, MD  QUEtiapine (SEROQUEL) 300 MG tablet Take 1 tablet by mouth daily. 09/22/17  Yes [provider]  risperiDONE (RISPERDAL) 1 MG tablet Take 1 tablet by mouth daily. 09/22/17  Yes [provider]    Review of Systems:  Unobtainable secondary the patient's acute encephalopathy. Physical Exam: Vitals:   09/24/17 1100 09/24/17 1115 09/24/17 1130 09/24/17 1145  BP: (!) 177/102 (!) 176/97 (!) 196/103 (!) 180/98  Pulse: 76 76 74 74  Resp: (!) 25 12 (!) 24 (!) 29  SpO2: 97% 97% 98% 100%   General:  Awake and alert, NAD, nontoxic Head/Eye: No conjunctival hemorrhage, no icterus, [place]/AT, No nystagmus ENT:  No icterus,  No thrush, good dentition, no pharyngeal exudate Neck:  No masses, no lymphadenpathy, no bruits CV:  RRR, no rub, no gallop, no S3 Lung: Diminished breath sounds at the bases but clear to auscultation.  No wheezing. Abdomen: soft/NT, +BS, nondistended, no peritoneal signs Ext: No cyanosis, No rashes, No petechiae, No lymphangitis, No edema Neuro: CNII-XII intact, strength 4/5 in  bilateral upper and lower extremities  Labs on Admission:  Basic Metabolic Panel: Recent Labs  Lab 09/24/17 1023 09/24/17 1035  NA 142 142  K 3.9 4.5  CL 107 107  CO2 22  --   GLUCOSE 119* 115*  BUN 12 13  CREATININE 0.69 0.60  CALCIUM 9.4  --    Liver Function Tests: Recent Labs  Lab 09/24/17 1023  AST 15  ALT 10*  ALKPHOS 52  BILITOT 0.2*  PROT 8.0  ALBUMIN 4.0   No results for input(s): LIPASE, AMYLASE in the last 168 hours. Recent Labs  Lab 09/24/17 1023  AMMONIA 27   CBC: Recent Labs  Lab 09/24/17 1023 09/24/17 1035  WBC 8.4  --   NEUTROABS 4.5  --   HGB 12.2 12.2  HCT 37.0 36.0  MCV 91.8  --   PLT 228  --    Coagulation Profile: Recent Labs  Lab 09/24/17 1023  INR 0.92   Cardiac Enzymes: No results for input(s): CKTOTAL, CKMB, CKMBINDEX, TROPONINI in the last 168 hours. BNP: Invalid input(s): POCBNP CBG: Recent Labs  Lab 09/24/17 1027  GLUCAP 111*  Urine analysis:    Component Value Date/Time   COLORURINE STRAW (A) 09/24/2017 1026   APPEARANCEUR CLEAR 09/24/2017 1026   LABSPEC 1.008 09/24/2017 1026   PHURINE 6.0 09/24/2017 1026   GLUCOSEU NEGATIVE 09/24/2017 1026   HGBUR NEGATIVE 09/24/2017 1026   BILIRUBINUR NEGATIVE 09/24/2017 1026   KETONESUR NEGATIVE 09/24/2017 1026   PROTEINUR NEGATIVE 09/24/2017 1026   UROBILINOGEN 0.2 11/10/2014 2212   NITRITE NEGATIVE 09/24/2017 1026   LEUKOCYTESUR NEGATIVE 09/24/2017 1026   Sepsis Labs: @LABRCNTIP (procalcitonin:4,lacticidven:4) )No results found for this or any previous visit (from the past 240 hour(s)).   Radiological Exams on Admission: Dg Chest 1 View  Result Date: 09/24/2017 CLINICAL DATA:  Altered mental status EXAM: CHEST 1 VIEW COMPARISON:  01/19/2016 FINDINGS: There is hyperinflation of the lungs compatible with COPD. Mild cardiomegaly. No confluent opacities or effusions. No acute bony abnormality. IMPRESSION: COPD.  Mild cardiomegaly.  No active disease. Electronically  Signed   By: Rolm Baptise M.D.   On: 09/24/2017 10:45   Ct Head Code Stroke Wo Contrast  Result Date: 09/24/2017 CLINICAL DATA:  Code stroke. 72 year old female. Unexplained altered mental status. Prior meningioma resection. EXAM: CT HEAD WITHOUT CONTRAST TECHNIQUE: Contiguous axial images were obtained from the base of the skull through the vertex without intravenous contrast. COMPARISON:  Brain MRI 10/30/2016. Head CT without contrast 01/19/2016. FINDINGS: Brain: Chronic right greater than left parietal (and right posterior frontal lobe) encephalomalacia. Chronic left operculum and external deep white matter capsule encephalomalacia is stable since 2017. Stable patchy cerebral white matter hypodensity elsewhere. Chronic ex vacuo enlargement of the posterolateral ventricles, especially the right occipital horn. Stable cerebral volume. No midline shift, acute ventriculomegaly, mass effect, evidence of mass lesion, intracranial hemorrhage or evidence of cortically based acute infarction. Vascular: Extensive Calcified atherosclerosis at the skull base. No suspicious intracranial vascular hyperdensity. Skull: Prior left lateral craniotomy. Stable visualized osseous structures. Sinuses/Orbits: Visualized paranasal sinuses and mastoids are stable and well pneumatized. Other: Rightward gaze deviation. Otherwise negative orbits soft tissues. Stable scalp soft tissues. ASPECTS [facility] Stroke Program Early CT Score) - Ganglionic level infarction (caudate, lentiform nuclei, internal capsule, insula, M1-M3 cortex): 7 - Supraganglionic infarction (M4-M6 cortex): 3 Total score (0-10 with 10 being normal): 10; chronic bilateral frontal lobe encephalomalacia. IMPRESSION: 1. Stable non contrast CT appearance of the brain since 2017. 2. ASPECTS is 10; chronic bilateral frontal lobe encephalomalacia. 3. Study discussed by telephone with Dr. Merrily Pew on 09/24/2017 at 10:45 . Electronically Signed   By: Genevie Ann M.D.   On:  09/24/2017 10:47    EKG: Independently reviewed.  Sinus rhythm, nonspecific T wave change    Time spent:60 minutes Code Status:   FULL Family Communication:  Spouse updated at bedside Disposition Plan: expect 1-2 day hospitalization Consults called: neurology DVT Prophylaxis: [place] Lovenox  Orson Eva, DO  [facility] Pager [phone]  If 7PM-7AM, please contact night-coverage [email or web address] Password [email or web address] 09/24/2017, 1:46 PM

## 2017-09-24 NOTE — ED Notes (Signed)
Currently attempting to complete neuro assessment with neurologist. Pt continues to yell and no cooperate with commands. Pt husband attempting to console pt, pt not able to be redirected.

## 2017-09-24 NOTE — Progress Notes (Signed)
Pt did not want to cooperate with MRI staff. Pt increasingly agitated. MD E-Paged, Ativan 1 mg IV given to patient for agitation. Will continue to monitor pt.

## 2017-09-24 NOTE — ED Provider Notes (Addendum)
[facility] Provider Note   CSN: [id number] Arrival date & time: 09/24/17  1023   An emergency department physician performed an initial assessment on this suspected stroke patient at 1029(pt back in room from CT @ 1041.).  History   Chief Complaint Chief Complaint  Patient presents with  . Code Stroke    HPI [name] is a 72 y.o. female.  The history is provided by a significant other and medical records.  Altered Mental Status   This is a new problem. The current episode started 1 to 2 hours ago. The problem has not changed since onset.Associated symptoms include confusion and weakness.   Blood sugar with EMS >100  LEVEL V CAVEAT SECONDARY TO ALTERED MENTAL STATUS  Past Medical History:  Diagnosis Date  . Aphasia   . GI bleed 12/01/2016  . Hypertension   . Seizures ([place])   . Stroke [facility]) 2009, 2015 9/27, 11/2014  . Thyroid disease     Patient Active Problem List   Diagnosis Date Noted  . Dysarthria 09/24/2017  . GI bleed 12/01/2016  . Acute blood loss anemia 12/01/2016  . Lower GI bleed   . Hemi-neglect of right side   . Hypothyroidism 01/19/2016  . Depression 01/19/2016  . Speech disturbance 01/19/2016  . Hyponatremia 01/19/2016  . Acute encephalopathy 01/19/2016  . Meningioma ([place]) 11/12/2014  . HTN (hypertension) 11/10/2014  . Tobacco use 11/10/2014  . History of CVA (cerebrovascular accident) 05/03/2014    Past Surgical History:  Procedure Laterality Date  . ABDOMINAL HYSTERECTOMY    . COLONOSCOPY N/A 12/03/2016   Procedure: COLONOSCOPY;  Surgeon: Daneil Dolin, MD;  [facility];  Service: Endoscopy;  Laterality: N/A;  . CRANIOTOMY Left 06/18/2015   Procedure: CRANIOTOMY TUMOR EXCISION w/Curve;  Surgeon: Consuella Lose, MD;  Location: [place] NEURO ORS;  Service: Neurosurgery;  Laterality: Left;  stereotactic Left Craniotomy for resection of meningioma  . PEG TUBE PLACEMENT    . PEG TUBE REMOVAL      OB  History    No data available       Home Medications    Prior to Admission medications   Medication Sig Start Date End Date Taking? Authorizing Provider  amitriptyline (ELAVIL) 10 MG tablet Take 20 mg by mouth at bedtime.   Yes [provider]  aspirin EC 81 MG tablet Take 81 mg by mouth every evening.   Yes [provider]  atorvastatin (LIPITOR) 40 MG tablet Take 40 mg by mouth every evening.    Yes [provider]  cholecalciferol (VITAMIN D) 400 units TABS tablet Take 400 Units by mouth daily.   Yes [provider]  levETIRAcetam (KEPPRA) 500 MG tablet Take 1 tablet (500 mg total) by mouth 2 (two) times daily. 01/21/16  Yes Vann, Tomi Bamberger, DO  levothyroxine (SYNTHROID, LEVOTHROID) 25 MCG tablet Take 25 mcg by mouth daily before breakfast.   Yes [provider]  PARoxetine (PAXIL) 10 MG tablet Take 10 mg by mouth daily.   Yes [provider]  PHENobarbital (LUMINAL) 97.2 MG tablet Take 1 tablet (97.2 mg total) by mouth at bedtime. 11/13/14  Yes Dondiego, Delfino Lovett, MD  QUEtiapine (SEROQUEL) 300 MG tablet Take 1 tablet by mouth daily. 09/22/17  Yes [provider]  risperiDONE (RISPERDAL) 1 MG tablet Take 1 tablet by mouth daily. 09/22/17  Yes [provider]    Family History Family History  Problem Relation Age of Onset  . Coronary  artery disease Unknown        Stents   . Stroke Father        Hemorrhagic   . Sudden death Father   . Ulcers Mother        Bleeding  . Colon cancer Sister     Social History Social History   Tobacco Use  . Smoking status: Current Every Day Smoker    Packs/day: 1.50    Years: 50.00    Pack years: 75.00    Types: Cigarettes  . Smokeless tobacco: Never Used  Substance Use Topics  . Alcohol use: No  . Drug use: No     Allergies   Codeine   Review of Systems Review of Systems  Unable to perform ROS: Mental status change  Neurological: Positive for weakness.    Psychiatric/Behavioral: Positive for confusion.     Physical Exam Updated Vital Signs BP (!) 165/88   Pulse 72   Temp 98.1 F (36.7 C) (Oral)   Resp 16   Ht 5\' 6"  (1.676 m)   Wt 50.2 kg (110 lb 10.7 oz)   SpO2 100%   BMI 17.86 kg/m   Physical Exam  Constitutional: She appears well-developed and well-nourished.  HENT:  Head: Normocephalic and atraumatic.  Eyes: Conjunctivae and EOM are normal.  Neck: Normal range of motion.  Cardiovascular: Normal rate and regular rhythm.  Pulmonary/Chest: No stridor. No respiratory distress.  Abdominal: Soft. She exhibits no distension.  Neurological: She is alert. No cranial nerve deficit or sensory deficit. GCS eye subscore is 4. GCS verbal subscore is 4. GCS motor subscore is 6.  Slight decreased strength L>R  Skin: Skin is warm and dry.  Nursing note and vitals reviewed.    ED Treatments / Results  Labs (all labs ordered are listed, but only abnormal results are displayed) Labs Reviewed  CBC - Abnormal; Notable for the following components:      Result Value   RDW 15.7 (*)    All other components within normal limits  COMPREHENSIVE METABOLIC PANEL - Abnormal; Notable for the following components:   Glucose, Bld 119 (*)    ALT 10 (*)    Total Bilirubin 0.2 (*)    All other components within normal limits  RAPID URINE DRUG SCREEN, HOSP PERFORMED - Abnormal; Notable for the following components:   Barbiturates POSITIVE (*)    All other components within normal limits  URINALYSIS, ROUTINE W REFLEX MICROSCOPIC - Abnormal; Notable for the following components:   Color, Urine STRAW (*)    All other components within normal limits  BLOOD GAS, VENOUS - Abnormal; Notable for the following components:   pCO2, Ven 39.5 (*)    pO2, Ven 151.0 (*)    All other components within normal limits  I-STAT CHEM 8, ED - Abnormal; Notable for the following components:   Glucose, Bld 115 (*)    Calcium, Ion 1.13 (*)    All other components  within normal limits  CBG MONITORING, ED - Abnormal; Notable for the following components:   Glucose-Capillary 111 (*)    All other components within normal limits  ETHANOL  PROTIME-INR  APTT  DIFFERENTIAL  AMMONIA  TSH  VITAMIN B12  I-STAT TROPONIN, ED    EKG  EKG Interpretation  Date/Time:  Monday September 24 2017 10:28:09 EST Ventricular Rate:  112 PR Interval:    QRS Duration: 93 QT Interval:  358 QTC Calculation: 489 R Axis:   -2 Text Interpretation:  Sinus tachycardia Low  voltage, extremity leads Borderline prolonged QT interval Artifact in lead(s) V5 and baseline wander in lead(s) II No significant change since last tracing Confirmed by Merrily Pew [phone]) on 09/24/2017 4:00:42 PM       Radiology Dg Chest 1 View  Result Date: 09/24/2017 CLINICAL DATA:  Altered mental status EXAM: CHEST 1 VIEW COMPARISON:  01/19/2016 FINDINGS: There is hyperinflation of the lungs compatible with COPD. Mild cardiomegaly. No confluent opacities or effusions. No acute bony abnormality. IMPRESSION: COPD.  Mild cardiomegaly.  No active disease. Electronically Signed   By: Rolm Baptise M.D.   On: 09/24/2017 10:45   Ct Head Code Stroke Wo Contrast  Result Date: 09/24/2017 CLINICAL DATA:  Code stroke. 72 year old female. Unexplained altered mental status. Prior meningioma resection. EXAM: CT HEAD WITHOUT CONTRAST TECHNIQUE: Contiguous axial images were obtained from the base of the skull through the vertex without intravenous contrast. COMPARISON:  Brain MRI 10/30/2016. Head CT without contrast 01/19/2016. FINDINGS: Brain: Chronic right greater than left parietal (and right posterior frontal lobe) encephalomalacia. Chronic left operculum and external deep white matter capsule encephalomalacia is stable since 2017. Stable patchy cerebral white matter hypodensity elsewhere. Chronic ex vacuo enlargement of the posterolateral ventricles, especially the right occipital horn. Stable cerebral volume. No  midline shift, acute ventriculomegaly, mass effect, evidence of mass lesion, intracranial hemorrhage or evidence of cortically based acute infarction. Vascular: Extensive Calcified atherosclerosis at the skull base. No suspicious intracranial vascular hyperdensity. Skull: Prior left lateral craniotomy. Stable visualized osseous structures. Sinuses/Orbits: Visualized paranasal sinuses and mastoids are stable and well pneumatized. Other: Rightward gaze deviation. Otherwise negative orbits soft tissues. Stable scalp soft tissues. ASPECTS [facility] Stroke Program Early CT Score) - Ganglionic level infarction (caudate, lentiform nuclei, internal capsule, insula, M1-M3 cortex): 7 - Supraganglionic infarction (M4-M6 cortex): 3 Total score (0-10 with 10 being normal): 10; chronic bilateral frontal lobe encephalomalacia. IMPRESSION: 1. Stable non contrast CT appearance of the brain since 2017. 2. ASPECTS is 10; chronic bilateral frontal lobe encephalomalacia. 3. Study discussed by telephone with Dr. Merrily Pew on 09/24/2017 at 10:45 . Electronically Signed   By: Genevie Ann M.D.   On: 09/24/2017 10:47    Procedures Procedures (including critical care time)  CRITICAL CARE Performed by: Merrily Pew Total critical care time: 35 minutes Critical care time was exclusive of separately billable procedures and treating other patients. Critical care was necessary to treat or prevent imminent or life-threatening deterioration. Critical care was time spent personally by me on the following activities: development of treatment plan with patient and/or surrogate as well as nursing, discussions with consultants, evaluation of patient's response to treatment, examination of patient, obtaining history from patient or surrogate, ordering and performing treatments and interventions, ordering and review of laboratory studies, ordering and review of radiographic studies, pulse oximetry and re-evaluation of patient's  condition.   Medications Ordered in ED Medications  atorvastatin (LIPITOR) tablet 40 mg (not administered)  cholecalciferol (VITAMIN D) tablet 400 Units (not administered)  levETIRAcetam (KEPPRA) tablet 500 mg (not administered)  levothyroxine (SYNTHROID, LEVOTHROID) tablet 25 mcg (not administered)  PARoxetine (PAXIL) tablet 10 mg (not administered)  PHENobarbital (LUMINAL) tablet 97.2 mg (not administered)  QUEtiapine (SEROQUEL) tablet 300 mg (not administered)   stroke: mapping our early stages of recovery book (not administered)  0.9 %  sodium chloride infusion (not administered)  acetaminophen (TYLENOL) tablet 650 mg (not administered)    Or  acetaminophen (TYLENOL) solution 650 mg (not administered)    Or  acetaminophen (TYLENOL) suppository 650 mg (  not administered)  senna-docusate (Senokot-S) tablet 1 tablet (not administered)  enoxaparin (LOVENOX) injection 40 mg (not administered)  aspirin suppository 300 mg (not administered)    Or  aspirin tablet 325 mg (not administered)  hydrALAZINE (APRESOLINE) injection 10 mg (not administered)  haloperidol lactate (HALDOL) injection 5 mg (not administered)  LORazepam (ATIVAN) injection 1 mg (1 mg Intravenous Given 09/24/17 1209)  hydrALAZINE (APRESOLINE) injection 10 mg (10 mg Intravenous Given 09/24/17 1402)     Initial Impression / Assessment and Plan / ED Course  I have reviewed the triage vital signs and the nursing notes.  Pertinent labs & imaging results that were available during my care of the patient were reviewed by me and considered in my medical decision making (see chart for details).     Code stroke called prior to arrival secondary to acute onset of confusion, dysarthria earlier in the day along with right-sided weakness.  On my evaluation patient just has altered mental status however code stroke continue secondary to previous symptoms and not acting right which was acute in onset.  Workup relatively unremarkable  however he was hypertensive so given hydralazine could have posterior reversible encephalopathic syndrome.  Still warrants admission for altered mental status and further TIA versus seizure workup.  Given hydralazine which seemed to improve her blood pressure however mental status is still diminished.  Final Clinical Impressions(s) / ED Diagnoses   Final diagnoses:  Encephalopathy  TIA (transient ischemic attack)    ED Discharge Orders    None       Ayahna Solazzo, Corene Cornea, MD 09/24/17 1559    Mccall Lomax, Corene Cornea, MD 09/24/17 1600

## 2017-09-24 NOTE — ED Notes (Signed)
Pt much calmer and family updated by Dr. Dayna Barker.

## 2017-09-24 NOTE — Progress Notes (Signed)
Alert to name and location.  Speech slurred.  Did not know month or age.  Able to follow commands of opening and closing eyes and equal grips tho left fingers contracted which is baseline.  Family says she walks at home with no assistance but needs help bathing and cannot use left hand to eat.  No limb drift at this point and smile equal.  Vitals stable.Lives with husband but sister has poa.  Both present at bedside.

## 2017-09-24 NOTE — Consult Note (Signed)
[facility] TeleNeurology Consult Services  Asked to see this patient in telemedicine consultation. Consultation was performed with assistance of ancillary/medical staff at bedside.  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 EMS noted R sided weakness on their arrival however in ED her weakness resolved and she is confused. She is asking her husband for her wedding ring and he states her confusion and perseveration about wedding ring has been ongoing for several months.  CT scan head per my review with b/l pca infarcts, and frontal encephalomalacia. Patient denies any hx of seizures and none witnessed this am.    VSS  Gen Wn/Wd in Nad  TeleStroke Assessment: LOC:   0 LOC questions:  2 LOC Commands :  2 Gaze : 0 Visual fields : 2, constricted vision per review of imaging appears chronic.  Facial movements : 0 Upper limb Motor  0 Lower limb Motor  0 Limb Coordination  - 0 Sensory -  - 0 Language -  0 Speech -   1 Neglect / extinction -  0  NIHSS Score: 7    IMPRESSION  AMS/Encephalopathy Transient R sided weakness TIA RO Stroke  Medical Decision Making:   Patient is not candidate for alteplase due to non focal / localizing exam  Not an IR candidate as low clinical suspicion for LVO by neurologic assessment.   Recommendations: - Daily antithrombotics to initiate now if no contraindication.  - Further work up with Stroke labs, MRI brain, EEG, NIVS Carotid will be deferred to inpt neurology service -  Needs Inpatient Neurology consultation and follow up  - Thank you for allowing [place] to participate in the care of your patient, if there are any questions please don't hesitate to contact us  Discussed plan of  care with patient/family/hospital staff   Physician: Sylvan Cheese, DO   [facility]

## 2017-09-24 NOTE — ED Notes (Signed)
Pt very agitated and trying to get out of bed.  Stating her right arm hurts.  Continues to ignore right side and will not speak to the person standing on her right.  Yelling that she wants to go home and crying intermittently.  Family states this is very abnormal for her and she normally converses rationally, although she needs help bathing and dressing.

## 2017-09-24 NOTE — ED Notes (Signed)
Code Stroke paged @ 1022 as EMS arrived at [facility].

## 2017-09-25 ENCOUNTER — Observation Stay (HOSPITAL_COMMUNITY): Payer: Medicare HMO

## 2017-09-25 ENCOUNTER — Encounter (HOSPITAL_COMMUNITY): Payer: Self-pay | Admitting: *Deleted

## 2017-09-25 ENCOUNTER — Observation Stay (HOSPITAL_BASED_OUTPATIENT_CLINIC_OR_DEPARTMENT_OTHER)
Admit: 2017-09-25 | Discharge: 2017-09-25 | Disposition: A | Discharge status: Home / Self Care | Payer: Medicare HMO | Attending: Internal Medicine | Admitting: Internal Medicine

## 2017-09-25 ENCOUNTER — Observation Stay (HOSPITAL_BASED_OUTPATIENT_CLINIC_OR_DEPARTMENT_OTHER): Payer: Medicare HMO

## 2017-09-25 DIAGNOSIS — G9340 Encephalopathy, unspecified: Secondary | ICD-10-CM

## 2017-09-25 DIAGNOSIS — I5030 Unspecified diastolic (congestive) heart failure: Secondary | ICD-10-CM

## 2017-09-25 LAB — BASIC METABOLIC PANEL
ANION GAP: 18 — AB (ref 5–15)
BUN: 14 mg/dL (ref 6–20)
CO2: 19 mmol/L — ABNORMAL LOW (ref 22–32)
Calcium: 9.6 mg/dL (ref 8.9–10.3)
Chloride: 99 mmol/L — ABNORMAL LOW (ref 101–111)
Creatinine, Ser: 0.62 mg/dL (ref 0.44–1.00)
GLUCOSE: 103 mg/dL — AB (ref 65–99)
Potassium: 3.6 mmol/L (ref 3.5–5.1)
Sodium: 136 mmol/L (ref 135–145)

## 2017-09-25 LAB — CBC
HCT: 39 % (ref 36.0–46.0)
HEMOGLOBIN: 13 g/dL (ref 12.0–15.0)
MCH: 30.2 pg (ref 26.0–34.0)
MCHC: 33.3 g/dL (ref 30.0–36.0)
MCV: 90.7 fL (ref 78.0–100.0)
Platelets: 231 10*3/uL (ref 150–400)
RBC: 4.3 MIL/uL (ref 3.87–5.11)
RDW: 15.3 % (ref 11.5–15.5)
WBC: 8.1 10*3/uL (ref 4.0–10.5)

## 2017-09-25 LAB — HEMOGLOBIN A1C
HEMOGLOBIN A1C: 6.1 % — AB (ref 4.8–5.6)
MEAN PLASMA GLUCOSE: 128.37 mg/dL

## 2017-09-25 LAB — LIPID PANEL
Cholesterol: 197 mg/dL (ref 0–200)
HDL: 90 mg/dL (ref >40)
LDL Cholesterol: 82 mg/dL (ref 0–99)
TRIGLYCERIDES: 124 mg/dL (ref <150)
Total CHOL/HDL Ratio: 2.2 RATIO
VLDL: 25 mg/dL (ref 0–40)

## 2017-09-25 LAB — ECHOCARDIOGRAM COMPLETE
Height: 66 in
Weight: 1770.7347 oz

## 2017-09-25 LAB — PHENOBARBITAL LEVEL: Phenobarbital: 18.8 ug/mL (ref 15.0–30.0)

## 2017-09-25 LAB — FOLATE: FOLATE: 19.9 ng/mL (ref >5.9)

## 2017-09-25 LAB — SEDIMENTATION RATE: Sed Rate: 42 mm/hr — ABNORMAL HIGH (ref 0–22)

## 2017-09-25 LAB — VITAMIN B12: VITAMIN B 12: 309 pg/mL (ref 180–914)

## 2017-09-25 MED ORDER — VALPROATE SODIUM 500 MG/5ML IV SOLN
INTRAVENOUS | Status: AC
  Filled 2017-09-25: qty 10

## 2017-09-25 MED ORDER — AMLODIPINE BESYLATE 5 MG PO TABS
5.0000 mg | ORAL_TABLET | Freq: Once | ORAL | Status: AC
  Administered 2017-09-25: 5 mg via ORAL
  Filled 2017-09-25: qty 1

## 2017-09-25 MED ORDER — CLONIDINE HCL 0.1 MG PO TABS
0.1000 mg | ORAL_TABLET | Freq: Once | ORAL | Status: AC
  Administered 2017-09-25: 0.1 mg via ORAL
  Filled 2017-09-25: qty 1

## 2017-09-25 MED ORDER — QUETIAPINE FUMARATE 100 MG PO TABS
100.0000 mg | ORAL_TABLET | Freq: Every day | ORAL | Status: DC
  Administered 2017-09-25: 100 mg via ORAL
  Filled 2017-09-25: qty 1

## 2017-09-25 MED ORDER — AMLODIPINE BESYLATE 5 MG PO TABS
5.0000 mg | ORAL_TABLET | Freq: Every day | ORAL | Status: DC
  Administered 2017-09-26 – 2017-09-28 (×3): 5 mg via ORAL
  Filled 2017-09-25 (×3): qty 1

## 2017-09-25 MED ORDER — VALPROATE SODIUM 500 MG/5ML IV SOLN
1.0000 g | Freq: Once | INTRAVENOUS | Status: AC
  Administered 2017-09-25: 1000 mg via INTRAVENOUS
  Filled 2017-09-25: qty 10

## 2017-09-25 MED ORDER — NICOTINE 21 MG/24HR TD PT24
21.0000 mg | MEDICATED_PATCH | Freq: Every day | TRANSDERMAL | Status: DC
  Administered 2017-09-25 – 2017-09-28 (×4): 21 mg via TRANSDERMAL
  Filled 2017-09-25 (×4): qty 1

## 2017-09-25 MED ORDER — VALPROATE SODIUM 500 MG/5ML IV SOLN
500.0000 mg | Freq: Three times a day (TID) | INTRAVENOUS | Status: DC
  Administered 2017-09-26 – 2017-09-27 (×5): 500 mg via INTRAVENOUS
  Filled 2017-09-25 (×8): qty 5

## 2017-09-25 MED ORDER — CLONIDINE HCL 0.1 MG PO TABS
0.1000 mg | ORAL_TABLET | Freq: Two times a day (BID) | ORAL | Status: DC
  Administered 2017-09-26 – 2017-09-28 (×5): 0.1 mg via ORAL
  Filled 2017-09-25 (×5): qty 1

## 2017-09-25 NOTE — Progress Notes (Signed)
*  PRELIMINARY RESULTS* Echocardiogram 2D Echocardiogram has been performed.  Leavy Cella 09/25/2017, 9:44 AM

## 2017-09-25 NOTE — Clinical Social Work Note (Signed)
Clinical Social Work Assessment  Patient Details  Name: [name] MRN: [id number] Date of Birth: September 12, 1945  Date of referral:  09/25/17               Reason for consult:  Facility Placement                Permission sought to share information with:    Permission granted to share information::     Name::        Agency::     Relationship::     Contact Information:  Mr. Schwarzkopf, spouse  Housing/Transportation Living arrangements for the past 2 months:  [place] of Information:  Patient, Spouse Patient Interpreter Needed:  None Criminal Activity/Legal Involvement Pertinent to Current Situation/Hospitalization:  No - Comment as needed Significant Relationships:  Spouse Lives with:  Spouse Do you feel safe going back to the place where you live?  Yes Need for family participation in patient care:  Yes (Comment)  Care giving concerns:  None identified.   Social Worker assessment / plan:  Patient lives with husband and at baseline she ambulates unassisted. Patient states that her husband helps her in to the shower and she is able to bathe herself.  Mr. Pascual states that he cooks for the family. Patient is agreeable to SNF for STR.   Employment status:  Retired Nurse, adult PT Recommendations:  [place] / Referral to community resources:  [place]  Patient/Family's Response to care:  Patient is agreeable to [facility].  Patient/Family's Understanding of and Emotional Response to Diagnosis, Current Treatment, and Prognosis:  Patient and spouse understand patient's diagnosis, treatment and prognosis.   Emotional Assessment Appearance:  Appears stated age Attitude/Demeanor/Rapport:    Affect (typically observed):  Accepting Orientation:  Oriented to Self, Oriented to Place, Oriented to Situation Alcohol / Substance use:  Not Applicable Psych involvement (Current and /or in the community):  No  (Comment)  Discharge Needs  Concerns to be addressed:  No discharge needs identified Readmission within the last 30 days:  No Current discharge risk:  None Barriers to Discharge:  No Barriers Identified   Ihor Gully, LCSW 09/25/2017, 3:03 PM

## 2017-09-25 NOTE — Progress Notes (Addendum)
Biilateral encephalomalacia , multiple sedating meds , ( Seroquel , Phenobarbital ) echo no thrombus source  [name] EVO:[id number] DOB: 1945-08-25 DOA: 09/24/2017 PCP: Brandi Gaskins, MD   Physical Exam: Blood pressure (!) 160/82, pulse 85, temperature 98.4 F (36.9 C), temperature source Oral, resp. rate 18, height 5\' 6"  (1.676 [name]), weight 50.2 kg (110 lb 10.7 oz), SpO2 100 %.Lungs- no rales wheeeze or ronchi . Heart reg rhythm no s3 s4 no [name] H T or R.    Investigations:  No results found for this or any previous visit (from the past 240 hour(s)).   Basic Metabolic Panel: Recent Labs    09/24/17 1023 09/24/17 1035  NA 142 142  K 3.9 4.5  CL 107 107  CO2 22  --   GLUCOSE 119* 115*  BUN 12 13  CREATININE 0.69 0.60  CALCIUM 9.4  --    Liver Function Tests: Recent Labs    09/24/17 1023  AST 15  ALT 10*  ALKPHOS 52  BILITOT 0.2*  PROT 8.0  ALBUMIN 4.0     CBC: Recent Labs    09/24/17 1023 09/24/17 1035  WBC 8.4  --   NEUTROABS 4.5  --   HGB 12.2 12.2  HCT 37.0 36.0  MCV 91.8  --   PLT 228  --     Dg Chest 1 View  Result Date: 09/24/2017 CLINICAL DATA:  Altered mental status EXAM: CHEST 1 VIEW COMPARISON:  01/19/2016 FINDINGS: There is hyperinflation of the lungs compatible with COPD. Mild cardiomegaly. No confluent opacities or effusions. No acute bony abnormality. IMPRESSION: COPD.  Mild cardiomegaly.  No active disease. Electronically Signed   By: Rolm Baptise [name].D.   On: 09/24/2017 10:45   Mr Brain Wo Contrast  Result Date: 09/24/2017 CLINICAL DATA:  Altered level of consciousness, slurred speech and difficulty moving RIGHT arm, now resolved. Confusion and agitation. History of meningioma resection. EXAM: MRI HEAD WITHOUT CONTRAST TECHNIQUE: Sagittal T1, axial coronal diffusion weighted imaging. Patient discontinued examination by climbing out of scanner. COMPARISON:  CT HEAD September 24, 2017 and MRI of the head October 30, 2016 FINDINGS: INTRACRANIAL  CONTENTS: No reduced diffusion to suggest acute ischemia, infection, hypercellular tumor or status epilepticus. Ex vacuo dilatation bilateral occipital horns associated with RIGHT greater than LEFT encephalomalacia. LEFT frontal lobe encephalomalacia better demonstrated on prior CT. VASCULAR: Nondiagnostic evaluation. SKULL AND UPPER CERVICAL SPINE: J-shaped sella. Susceptibility artifact LEFT calvarium corresponding to known craniotomy. Craniocervical junction maintained. SINUSES/ORBITS: Nondiagnostic evaluation. OTHER: None. IMPRESSION: 1. Limited 3 sequence noncontrast MRI of the head: No acute intracranial process. Electronically Signed   By: Elon Alas [name].D.   On: 09/24/2017 19:23   Ct Head Code Stroke Wo Contrast  Result Date: 09/24/2017 CLINICAL DATA:  Code stroke. 72 year old female. Unexplained altered mental status. Prior meningioma resection. EXAM: CT HEAD WITHOUT CONTRAST TECHNIQUE: Contiguous axial images were obtained from the base of the skull through the vertex without intravenous contrast. COMPARISON:  Brain MRI 10/30/2016. Head CT without contrast 01/19/2016. FINDINGS: Brain: Chronic right greater than left parietal (and right posterior frontal lobe) encephalomalacia. Chronic left operculum and external deep white matter capsule encephalomalacia is stable since 2017. Stable patchy cerebral white matter hypodensity elsewhere. Chronic ex vacuo enlargement of the posterolateral ventricles, especially the right occipital horn. Stable cerebral volume. No midline shift, acute ventriculomegaly, mass effect, evidence of mass lesion, intracranial hemorrhage or evidence of cortically based acute infarction. Vascular: Extensive Calcified atherosclerosis at the skull base. No suspicious intracranial vascular  hyperdensity. Skull: Prior left lateral craniotomy. Stable visualized osseous structures. Sinuses/Orbits: Visualized paranasal sinuses and mastoids are stable and well pneumatized. Other:  Rightward gaze deviation. Otherwise negative orbits soft tissues. Stable scalp soft tissues. ASPECTS [facility] Stroke Program Early CT Score) - Ganglionic level infarction (caudate, lentiform nuclei, internal capsule, insula, M1-M3 cortex): 7 - Supraganglionic infarction (M4-M6 cortex): 3 Total score (0-10 with 10 being normal): 10; chronic bilateral frontal lobe encephalomalacia. IMPRESSION: 1. Stable non contrast CT appearance of the brain since 2017. 2. ASPECTS is 10; chronic bilateral frontal lobe encephalomalacia. 3. Study discussed by telephone with Dr. Merrily Pew on 09/24/2017 at 10:45 . Electronically Signed   By: Genevie Ann [name].D.   On: 09/24/2017 10:47      Medications:   Impression:  Active Problems:   Acute encephalopathy   Dysarthria     Plan: decrease seroquel to 100 mg h.s. Swallowing study by speech pathology. Await neutro consult , CUS , dementia panel    Consultants: Neuro , speech pathology    Procedures   Antibiotics:           Time spent:30 min.   LOS: 0 days   [name]   09/25/2017, 1:22 PM

## 2017-09-25 NOTE — Procedures (Signed)
ELECTROENCEPHALOGRAM REPORT  Date of Study: 09/25/2017  Patient's Name: [name] MRN: [id number] Date of Birth: Jul 05, 1946  Referring Provider: Dr. Shanon Brow Tat  Clinical History: This is a 72 year old woman with a history of seizures, admitted with confusion  Medications: levETIRAcetam (KEPPRA) 100 MG/ML solution 500 mg  acetaminophen (TYLENOL) tablet 650 mg  aspirin tablet 325 mg  atorvastatin (LIPITOR) tablet 40 mg  cholecalciferol (VITAMIN D) tablet 400 Units  enoxaparin (LOVENOX) injection 40 mg  haloperidol lactate (HALDOL) injection 5 mg  hydrALAZINE (APRESOLINE) injection 10 mg  levothyroxine (SYNTHROID, LEVOTHROID) tablet 25 mcg  nicotine (NICODERM CQ - dosed in mg/24 hours) patch 21 mg  PARoxetine (PAXIL) tablet 10 mg  PHENobarbital (LUMINAL) tablet 97.2 mg  QUEtiapine (SEROQUEL) tablet 100 mg  senna-docusate (Senokot-S) tablet 1 tablet   Technical Summary: A multichannel digital EEG recording measured by the international 10-20 system with electrodes applied with paste and impedances below 5000 ohms performed in our laboratory with EKG monitoring in an awake and drowsy patient.  Hyperventilation and photic stimulation were not performed.  The digital EEG was referentially recorded, reformatted, and digitally filtered in a variety of bipolar and referential montages for optimal display.    Description: The patient is awake and drowsy during the recording.  During maximal wakefulness, there is a symmetric, medium voltage 9.5 Hz posterior dominant rhythm that attenuates with eye opening.  There is frequent focal 4-5 Hz theta slowing over the left temporal region, at times sharply contoured without clear epileptogenic potential. During drowsiness, there is an increase in theta slowing of the background. Deeper stages of sleep were not seen.  Hyperventilation and photic stimulation were not performed. There were no epileptiform discharges or electrographic seizures seen.     EKG lead was unremarkable.  Impression: This awake and asleep EEG is abnormal due to focal slowing over the left temporal region.  Clinical Correlation of the above findings indicates focal cerebral dysfunction over the left temporal region suggestive of underlying structural or physiologic abnormality. The absence of epileptiform discharges does not exclude a clinical diagnosis of epilepsy. Clinical correlation is advised.    Ellouise Newer, M.D.

## 2017-09-25 NOTE — Plan of Care (Signed)
  Acute Rehab PT Goals(only PT should resolve) Pt Will Go Supine/Side To Sit 09/25/2017 1406 - Progressing by Lonell Grandchild, PT Flowsheets Taken 09/25/2017 1406  Pt will go Supine/Side to Sit with min guard assist Patient Will Transfer Sit To/From Stand 09/25/2017 1406 - Progressing by Lonell Grandchild, PT Flowsheets Taken 09/25/2017 1406  Patient will transfer sit to/from stand with min guard assist Pt Will Transfer Bed To Chair/Chair To Bed 09/25/2017 1406 - Progressing by Lonell Grandchild, PT Flowsheets Taken 09/25/2017 1406  Pt will Transfer Bed to Chair/Chair to Bed min guard assist Pt Will Ambulate 09/25/2017 1406 - Progressing by Lonell Grandchild, PT Flowsheets Taken 09/25/2017 1406  Pt will Ambulate 50 feet;with min guard assist;with cane Note Or hand held assist  2:07 PM, 09/25/17 Lonell Grandchild, MPT Physical Therapist with [facility] [phone] office [phone] mobile phone

## 2017-09-25 NOTE — Evaluation (Signed)
Clinical/Bedside Swallow Evaluation Patient Details  Name: [name] MRN: [id number] Date of Birth: [birth year]  Today's Date: 09/25/2017 Time: SLP Start Time (ACUTE ONLY): 63 SLP Stop Time (ACUTE ONLY): 1252 SLP Time Calculation (min) (ACUTE ONLY): 22 min  Past Medical History:  Past Medical History:  Diagnosis Date  . Aphasia   . GI bleed 12/01/2016  . Hypertension   . Seizures ([place])   . Stroke [facility]) 2009, 2015 9/27, 11/2014  . Thyroid disease    Past Surgical History:  Past Surgical History:  Procedure Laterality Date  . ABDOMINAL HYSTERECTOMY    . COLONOSCOPY N/A 12/03/2016   Procedure: COLONOSCOPY;  Surgeon: Daneil Dolin, MD;  Location: [facility];  Service: Endoscopy;  Laterality: N/A;  . CRANIOTOMY Left 06/18/2015   Procedure: CRANIOTOMY TUMOR EXCISION w/Curve;  Surgeon: Consuella Lose, MD;  Location: [place] NEURO ORS;  Service: Neurosurgery;  Laterality: Left;  stereotactic Left Craniotomy for resection of meningioma  . PEG TUBE PLACEMENT    . PEG TUBE REMOVAL     HPI:  [name] is a 72 y.o. female with medical history of hypertension not on medications, seizure disorder, stroke, GI bleed presenting with acute onset of dysarthria and confusion around 9:30 in the morning on 09/24/2017.  The patient has been in her usual state of health up until that point.  Presently, the patient is unable to provide any history secondary to her confusion.  All of his history is obtained from speaking with EDP, husband, and review of the medical record.  Around 9:30 AM on 09/24/2017, the patient had acute onset of numbness in her right upper extremity with associated weakness and slurred speech.  In addition, the patient was confused according to the patient's family.  Prior to EMS arrival, the patient somewhat improved.  However upon EMS arrival, the patient's dysarthria came back.  The patient was started on risperidone approximately 1 month prior to this admission, but has not  been on any other new medications.  There is been no reports of fevers, chills, chest pain, shortness breath, cough, hemoptysis, nausea, vomiting, diarrhea.     Assessment / Plan / Recommendation Clinical Impression  Clinical swallow evaluation completed at bedside with family present in room. Pt alert and cooperative. Oral motor examination significant for reduced lingual coordination and strength and loose fitting upper dentures. Pt without overt signs or symptoms of aspiration with textures and consistencies presented. Pt had some difficulty with self feeding, which seemed related to either visual deficits and/or proprioception deficits when self feeding with right hand. Recommend D3/mech soft with thin liquids, po meds whole with liquids and intermittent supervision for compensatory strategies. SLP will follow with SLE.  SLP Visit Diagnosis: Dysphagia, oral phase (R13.11)    Aspiration Risk  Mild aspiration risk    Diet Recommendation Dysphagia 3 (Mech soft);Thin liquid   Liquid Administration via: Cup;Straw Medication Administration: Whole meds with liquid Supervision: Patient able to self feed;Intermittent supervision to cue for compensatory strategies Compensations: Slow rate;Small sips/bites;Lingual sweep for clearance of pocketing Postural Changes: Seated upright at 90 degrees;Remain upright for at least 30 minutes after po intake    Other  Recommendations Oral Care Recommendations: Oral care BID Other Recommendations: Clarify dietary restrictions   Follow up Recommendations Skilled Nursing facility      Frequency and Duration min 2x/week  1 week       Prognosis Prognosis for Safe Diet Advancement: Good      Swallow Study   General Date  of Onset: 09/24/17 HPI: [name] is a 72 y.o. female with medical history of hypertension not on medications, seizure disorder, stroke, GI bleed presenting with acute onset of dysarthria and confusion around 9:30 in the morning on  09/24/2017.  The patient has been in her usual state of health up until that point.  Presently, the patient is unable to provide any history secondary to her confusion.  All of his history is obtained from speaking with EDP, husband, and review of the medical record.  Around 9:30 AM on 09/24/2017, the patient had acute onset of numbness in her right upper extremity with associated weakness and slurred speech.  In addition, the patient was confused according to the patient's family.  Prior to EMS arrival, the patient somewhat improved.  However upon EMS arrival, the patient's dysarthria came back.  The patient was started on risperidone approximately 1 month prior to this admission, but has not been on any other new medications.  There is been no reports of fevers, chills, chest pain, shortness breath, cough, hemoptysis, nausea, vomiting, diarrhea.   Type of Study: Bedside Swallow Evaluation Previous Swallow Assessment: 2017 BSE Diet Prior to this Study: NPO Temperature Spikes Noted: No Respiratory Status: Room air History of Recent Intubation: No Behavior/Cognition: Alert;Cooperative;Pleasant mood Oral Cavity Assessment: Within Functional Limits Oral Care Completed by SLP: No Oral Cavity - Dentition: Dentures, top;Dentures, bottom Vision: Impaired for self-feeding Self-Feeding Abilities: Needs set up;Able to feed self(reduced proprioception on right side) Patient Positioning: Upright in bed Baseline Vocal Quality: Normal Volitional Cough: Strong Volitional Swallow: Able to elicit    Oral/Motor/Sensory Function Overall Oral Motor/Sensory Function: Generalized oral weakness Facial ROM: Within Functional Limits Facial Symmetry: Within Functional Limits Facial Strength: Within Functional Limits Facial Sensation: Within Functional Limits Lingual ROM: Reduced right;Reduced left;Suspected CN XII (hypoglossal) dysfunction Lingual Symmetry: Within Functional Limits Lingual Strength: Reduced;Suspected  CN XII (hypoglossal) dysfunction Lingual Sensation: Within Functional Limits Velum: Within Functional Limits Mandible: Within Functional Limits   Ice Chips Ice chips: Within functional limits Presentation: Spoon   Thin Liquid Thin Liquid: Within functional limits Presentation: Cup;Straw;Self Fed    Nectar Thick Nectar Thick Liquid: Not tested   Honey Thick Honey Thick Liquid: Not tested   Puree Puree: Impaired Presentation: Spoon Oral Phase Impairments: Reduced lingual movement/coordination Oral Phase Functional Implications: Prolonged oral transit   Solid   GO   Solid: Impaired Presentation: Self Fed Oral Phase Impairments: Reduced lingual movement/coordination;Impaired mastication(ill fitting upper dentures) Oral Phase Functional Implications: Prolonged oral transit;Oral residue       Thank you,  Genene Churn, [place]   PORTER,DABNEY 09/25/2017,1:15 PM

## 2017-09-25 NOTE — Evaluation (Signed)
Physical Therapy Evaluation Patient Details Name: [name] MRN: [id number] DOB: 04-Apr-1946 Today's Date: 09/25/2017   History of Present Illness  [name] is a 72 y.o. female with medical history of hypertension not on medications, seizure disorder, stroke, GI bleed presenting with acute onset of dysarthria and confusion around 9:30 in the morning on 09/24/2017.  The patient has been in her usual state of health up until that point.  Presently, the patient is unable to provide any history secondary to her confusion.  All of his history is obtained from speaking with EDP, husband, and review of the medical record.  Around 9:30 AM on 09/24/2017, the patient had acute onset of numbness in her right upper extremity with associated weakness and slurred speech.  In addition, the patient was confused according to the patient's family.  Prior to EMS arrival, the patient somewhat improved.  However upon EMS arrival, the patient's dysarthria came back.  The patient was started on risperidone approximately 1 month prior to this admission, but has not been on any other new medications.  There is been no reports of fevers, chills, chest pain, shortness breath, cough, hemoptysis, nausea, vomiting, diarrhea.     Clinical Impression  Patient ambulating worse than at baseline and tends to drift to left normally per patient's niece.  At baseline patient does not use LUE for most functional tasks and hold left hand in claw like position.  Patient demonstrates slow unsteady cadence with 3 near falls prevent by writer, tends to drift left/right and frequently holds onto side rail in hallway when walking.  Patient will benefit from continued physical therapy in hospital and recommended venue below to increase strength, balance, endurance for safe ADLs and gait.    Follow Up Recommendations SNF;Supervision/Assistance - 24 hour    Equipment Recommendations  Cane    Recommendations for Other Services        Precautions / Restrictions Precautions Precautions: Fall Restrictions Weight Bearing Restrictions: No      Mobility  Bed Mobility Overal bed mobility: Needs Assistance Bed Mobility: Supine to Sit;Sit to Supine     Supine to sit: Min assist Sit to supine: Min assist      Transfers Overall transfer level: Needs assistance Equipment used: 1 person hand held assist;None Transfers: Sit to/from Stand Sit to Stand: Mod assist         General transfer comment: demonstrates slow labored movement  Ambulation/Gait Ambulation/Gait assistance: Mod assist Ambulation Distance (Feet): 30 Feet Assistive device: 1 person hand held assist Gait Pattern/deviations: Decreased step length - right;Decreased step length - left;Decreased stride length;Drifts right/left;Wide base of support;Ataxic   Gait velocity interpretation: Below normal speed for age/gender General Gait Details: patient demonstrates slow ataxic like gait which is baseline for patient, but drifts to the right and steps more unsteady than baseline per patient's neice, limited secondary to fatigue and poor standing blalance  Stairs            Wheelchair Mobility    Modified Rankin (Stroke Patients Only)       Balance Overall balance assessment: Needs assistance Sitting-balance support: No upper extremity supported;Feet supported Sitting balance-Leahy Scale: Fair     Standing balance support: No upper extremity supported;During functional activity Standing balance-Leahy Scale: Poor Standing balance comment: fair/poor with hand held assist                             Pertinent Vitals/Pain Pain Assessment: No/denies  pain    Home Living Family/patient expects to be discharged to:: Private residence Living Arrangements: Spouse/significant other Available Help at Discharge: Family(neice ) Type of Home: House Home Access: Stairs to enter Entrance Stairs-Rails: Right Entrance Stairs-Number of  Steps: 3 Home Layout: One level Home Equipment: Shower seat      Prior Function Level of Independence: Needs assistance   Gait / Transfers Assistance Needed: Supervised household gait  ADL's / Homemaking Assistance Needed: assisted by spouse and neice        Hand Dominance        Extremity/Trunk Assessment   Upper Extremity Assessment Upper Extremity Assessment: Defer to OT evaluation    Lower Extremity Assessment Lower Extremity Assessment: Generalized weakness;RLE deficits/detail;LLE deficits/detail RLE Deficits / Details: grossly -4/5 LLE Deficits / Details: grossly -4/5    Cervical / Trunk Assessment Cervical / Trunk Assessment: Normal  Communication   Communication: HOH  Cognition Arousal/Alertness: Awake/alert Behavior During Therapy: WFL for tasks assessed/performed Overall Cognitive Status: Within Functional Limits for tasks assessed                                        General Comments      Exercises     Assessment/Plan    PT Assessment Patient needs continued PT services  PT Problem List Decreased strength;Decreased activity tolerance;Decreased balance;Decreased mobility       PT Treatment Interventions Gait training;Functional mobility training;Therapeutic activities;Therapeutic exercise;Patient/family education;Stair training    PT Goals (Current goals can be found in the Care Plan section)  Acute Rehab PT Goals Patient Stated Goal: return home PT Goal Formulation: With patient/family Time For Goal Achievement: 10/09/17 Potential to Achieve Goals: Good    Frequency 7X/week   Barriers to discharge        Co-evaluation               AM-PAC PT "6 Clicks" Daily Activity  Outcome Measure   Difficulty moving from lying on back to sitting on the side of the bed? : A Little Difficulty sitting down on and standing up from a chair with arms (e.g., wheelchair, bedside commode, etc,.)?: A Little Help needed moving to  and from a bed to chair (including a wheelchair)?: A Lot Help needed walking in hospital room?: A Lot Help needed climbing 3-5 steps with a railing? : A Lot 6 Click Score: 12    End of Session Equipment Utilized During Treatment: Gait belt Activity Tolerance: Patient limited by fatigue Patient left: in bed;with call bell/phone within reach;with bed alarm set;with family/visitor present Nurse Communication: Mobility status PT Visit Diagnosis: Unsteadiness on feet (R26.81);Other abnormalities of gait and mobility (R26.89);Muscle weakness (generalized) (M62.81)    Time: 1517-6160 PT Time Calculation (min) (ACUTE ONLY): 27 min   Charges:   PT Evaluation $PT Eval Moderate Complexity: 1 Mod PT Treatments $Therapeutic Activity: 23-37 mins   PT G Codes:        2:04 PM, 10/16/17 Lonell Grandchild, MPT Physical Therapist with [facility] [phone] office [phone] mobile phone

## 2017-09-25 NOTE — [place] FL2 (Signed)
[place] MEDICAID FL2 LEVEL OF CARE SCREENING TOOL     IDENTIFICATION  Patient Name: [name] Birthdate: 12-07-1945 Sex: female Admission Date (Current Location): 09/24/2017  [facility] and [place] Number:  [employer] and Address:  [place] [address], [place]      Provider Number: [id number]  Attending Physician Name and Address:  Lucia Gaskins, MD  Relative Name and Phone Number:       Current Level of Care: Other (Comment)(Observation) Recommended Level of Care: [place] Prior Approval Number:    Date Approved/Denied:   PASRR Number: [id number])  Discharge Plan: SNF    Current Diagnoses: Patient Active Problem List   Diagnosis Date Noted  . Dysarthria 09/24/2017  . GI bleed 12/01/2016  . Acute blood loss anemia 12/01/2016  . Lower GI bleed   . Hemi-neglect of right side   . Hypothyroidism 01/19/2016  . Depression 01/19/2016  . Speech disturbance 01/19/2016  . Hyponatremia 01/19/2016  . Acute encephalopathy 01/19/2016  . Meningioma ([place]) 11/12/2014  . HTN (hypertension) 11/10/2014  . Tobacco use 11/10/2014  . History of CVA (cerebrovascular accident) 05/03/2014    Orientation RESPIRATION BLADDER Height & Weight     Self, Situation, Place  Normal Incontinent Weight: 110 lb 10.7 oz (50.2 kg) Height:  5\' 6"  (167.6 cm)  BEHAVIORAL SYMPTOMS/MOOD NEUROLOGICAL BOWEL NUTRITION STATUS    Convulsions/Seizures(history of seizures) Continent Diet(see discharge summary)  AMBULATORY STATUS COMMUNICATION OF NEEDS Skin   Limited Assist Verbally Normal                       Personal Care Assistance Level of Assistance  Bathing, Feeding, Dressing Bathing Assistance: Limited assistance Feeding assistance: Independent Dressing Assistance: Limited assistance     Functional Limitations Info  Sight, Hearing, Speech Sight Info: Adequate Hearing Info: Adequate Speech Info:  Impaired(slurred/dysarthria)    SPECIAL CARE FACTORS FREQUENCY  PT (By licensed PT)     PT Frequency: 5x/weel              Contractures Contractures Info: Not present    Additional Factors Info  Code Status, Allergies, Psychotropic Code Status Info: Full code Allergies Info: Codeine Psychotropic Info: Seroquel         Current Medications (09/25/2017):  This is the current hospital active medication list Current Facility-Administered Medications  Medication Dose Route Frequency Provider Last Rate Last Dose  . 0.9 %  sodium chloride infusion   Intravenous Continuous Tat, Shanon Brow, MD 10 mL/hr at 09/24/17 1849    . acetaminophen (TYLENOL) tablet 650 mg  650 mg Oral Q4H PRN Tat, David, MD       Or  . acetaminophen (TYLENOL) solution 650 mg  650 mg Per Tube Q4H PRN Tat, Shanon Brow, MD       Or  . acetaminophen (TYLENOL) suppository 650 mg  650 mg Rectal Q4H PRN Tat, Shanon Brow, MD      . aspirin suppository 300 mg  300 mg Rectal Daily Tat, David, MD   300 mg at 09/25/17 1019   Or  . aspirin tablet 325 mg  325 mg Oral Daily Tat, David, MD      . atorvastatin (LIPITOR) tablet 40 mg  40 mg Oral QPM Tat, David, MD      . cholecalciferol (VITAMIN D) tablet 400 Units  400 Units Oral Daily Tat, David, MD      . enoxaparin (LOVENOX) injection 40 mg  40 mg Subcutaneous Q24H  Orson Eva, MD   40 mg at 09/24/17 1841  . haloperidol lactate (HALDOL) injection 5 mg  5 mg Intravenous Q6H PRN Tat, Shanon Brow, MD   5 mg at 09/25/17 0017  . hydrALAZINE (APRESOLINE) injection 10 mg  10 mg Intravenous Q6H PRN Tat, Shanon Brow, MD      . levETIRAcetam (KEPPRA) 100 MG/ML solution 500 mg  500 mg Oral BID Tat, David, MD      . levothyroxine (SYNTHROID, LEVOTHROID) tablet 25 mcg  25 mcg Oral QAC breakfast Tat, David, MD      . nicotine (NICODERM CQ - dosed in mg/24 hours) patch 21 mg  21 mg Transdermal Daily Lucia Gaskins, MD   21 mg at 09/25/17 1154  . PARoxetine (PAXIL) tablet 10 mg  10 mg Oral Daily Tat, David, MD       . PHENobarbital (LUMINAL) tablet 97.2 mg  97.2 mg Oral QHS Tat, David, MD      . QUEtiapine (SEROQUEL) tablet 100 mg  100 mg Oral QHS Dondiego, Richard, MD      . senna-docusate (Senokot-S) tablet 1 tablet  1 tablet Oral QHS PRN Orson Eva, MD         Discharge Medications: Please see discharge summary for a list of discharge medications.  Relevant Imaging Results:  Relevant Lab Results:   Additional Information SSN [id number] Clydene Pugh, LCSW

## 2017-09-25 NOTE — Consult Note (Addendum)
[place] A. [name], MD     [email or web address]          [name] is an 72 y.o. female.   ASSESSMENT/PLAN: 1. 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 2. Significant behavioral disruption which I believe is in part due to multiple psychotropic medications including Keppra: 3. Remote infarcts: The patient should be in a full dose aspirin.   RECOMMENDATION: 1. We will start the patient on IV valproic acid. We'll give her thousand milligrams and started maintenance dose of 500 mg 3 times a day. I suspect that a lot of the behavioral issues are due to Green which is known to cause bradycardia rhythm disruption. When she is on a good dose of valproic acid, I will discontinue the Keppra. I will obtain a stat dose of phenobarbital. We should try to minimize her psychotropic medications. Consider reducing the dose of the Seroquel and Haldol.     The patient is a 72 year old black female who is known to my service for the last few years. She has had recurrent episodes of acute dysarthria with negative imaging for infarcts. 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 She was started on Keppra after hospitalization in [place] in 2017. She did see [place] in follow-up once but has now returned for follow-up. It appears that she is on multiple psychotropic medications since we last saw the patient in 2017. The family reports that she has had some behavioral issues. She has not had these problems in the past which I suspect is likely due to the initiation of Keppra. Again, she presents with another episode of worsening dysarthria. The niece also reports that she seemed to have visual problems on the  right side. She also has significant altered mental status. Imaging failed to show acute infarcts. She has undergone excision of meningioma on the left because result this was concerning to her seizures. She apparently has done well with this until recently.      GENERAL: This a very pleasant thin female in no acute distress. She is quite bothersome about the fact that she has difficulty speaking.  HEENT: Supple. Atraumatic normocephalic.   ABDOMEN: soft  EXTREMITIES: No edema   BACK: Normal.  SKIN: Normal by inspection.    MENTAL STATUS: She is awake and alert and cooperative with evaluation. Speech is very dysarthric to the point of intelligible speech.  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  MOTOR: She has a left upper extremity monoparesis. Deltoid graded as 4/5. Hand grip to 2/5 with significant increased tone/spasticity associated with contracture of the hand. The other extremities shows normal tone, bulk and strength.  COORDINATION: Left finger to nose is normal, right finger to nose is normal, No rest tremor; no intention tremor; no postural tremor; no bradykinesia.  REFLEXES: Deep tendon reflexes are symmetrical and normal, but slightly diminished in the legs.   SENSATION: Normal to light touch.                         [[[[[[[[[[[  [name] PRIOR NOTE 2016 1. Recurrent episodes of dysphagia/speech impairment and confusion with negative imaging for ischemic events. Post-lesional phenomena seems most likely particularly epileptic events. 2. Baseline history of epilepsy apparently well-controlled although these  recurrent events are suspicious for seizures. 3. Left frontal meningioma which is significant given the size and location potentially causing aphasia and seizures. 4. 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 There is also history of intracranial hemorrhage however.  RECOMMENDATION: Given her subtherapeutic phenobarbital levels, the dose of phenobarbital will be increased. We'll go ahead and recheck a level tonight. EEG. One-time dose of Decadron given the mild vasogenic edema associated with the meningioma. Out patient neurosurgical consultation for excision of the meningioma. Speech evaluation/ swallowing evaluation. 30 day event monitor for possible cardioembolic events causing the previous large vessel chronic infarcts.   The patient is 72 year old black female who presents with the acute onset of impaired speech and confusion. The patient had a similar event about 6 months ago had a very extensive workup at that time including MRI/MRA, carotid and echocardiography. The workup was unrevealing at that time. We did have suspicion that the events could've been an epileptic event and she was to get her phenobarbital increase but it appears this was not done. She also was passed in outpatient follow-up but this was not done. The patient at baseline seems to function fairly well with the baseline mild to moderate dysarthria per the niece. She is able to talk relatively fluently until last night when she developed the acute dysarthria/aphasia. Her symptoms lasted for several hours but has improved. However, the niece reports that she's not baseline. She did have a bedside swallowing test last night but apparently failed and she has been kept nothing by mouth. She does not report worsening focal neurological symptoms. 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 No  loss of consciousness reported. No chest pain, shortness of breath, GI GU symptoms. The review of systems is otherwise negative.      [[[[[[[[[[[[[[[[[MY PRIOR NOTE 04-08-14 1. Acute altered mental status Of unclear etiology. However, given the previous large vessel cortical infarcts and meningioma, seizure is suspected.  2. Moderate size left frontal extra-axial mass consistent with a meningioma. This is associated with the moderate vasogenic edema.  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  4. 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 ( Follow with repeat Brain CTA 12 months)    Increase phenobarbital. I think we should increase the phenobarbital tone rather than adding what the so-called new or safer agents. She has been well controlled on phenobarbital for many years and without side effects.  Likely trial of low-dose steroids. Decadron or prednisone should be sufficient.   Outpatient neurosurgical consultation for the meningioma.   Given the high likelihood of cardioembolic strokes, A 30 day event monitor/Holter monitor to evaluate for atrial fibrillation is suggested.  ]]]]]]]]]]]]            Blood pressure (!) 169/113, pulse 87, temperature 98.5 F (36.9 C), temperature source Oral, resp. rate 17, height 5' 6"  (1.676 m), weight 110 lb 10.7 oz (50.2 kg), SpO2 100 %.  Past Medical History:  Diagnosis Date  . Aphasia   . GI bleed 12/01/2016  . Hypertension   . Seizures ([place])   . Stroke [facility]) 2009, 2015 9/27, 11/2014  . Thyroid disease  Past Surgical History:  Procedure Laterality Date  . ABDOMINAL HYSTERECTOMY    . COLONOSCOPY N/A 12/03/2016   Procedure: COLONOSCOPY;  Surgeon: Daneil Dolin, MD;  Location: [facility];  Service: Endoscopy;  Laterality: N/A;  . CRANIOTOMY Left 06/18/2015   Procedure: CRANIOTOMY TUMOR EXCISION w/Curve;  Surgeon: Consuella Lose, MD;  Location:  [facility];  Service: Neurosurgery;  Laterality: Left;  stereotactic Left Craniotomy for resection of meningioma  . PEG TUBE PLACEMENT    . PEG TUBE REMOVAL      Family History  Problem Relation Age of Onset  . Coronary artery disease Unknown        Stents   . Stroke Father        Hemorrhagic   . Sudden death Father   . Ulcers Mother        Bleeding  . Colon cancer Sister     Social History:  reports that she has been smoking cigarettes.  She has a 75.00 pack-year smoking history. she has never used smokeless tobacco. She reports that she does not drink alcohol or use drugs.  Allergies:  Allergies  Allergen Reactions  . Codeine Nausea And Vomiting and Rash    Medications: Prior to Admission medications   Medication Sig Start Date End Date Taking? Authorizing Provider  amitriptyline (ELAVIL) 10 MG tablet Take 20 mg by mouth at bedtime.   Yes [provider]  aspirin EC 81 MG tablet Take 81 mg by mouth every evening.   Yes [provider]  atorvastatin (LIPITOR) 40 MG tablet Take 40 mg by mouth every evening.    Yes [provider]  cholecalciferol (VITAMIN D) 400 units TABS tablet Take 400 Units by mouth daily.   Yes [provider]  levETIRAcetam (KEPPRA) 500 MG tablet Take 1 tablet (500 mg total) by mouth 2 (two) times daily. 01/21/16  Yes Vann, Tomi Bamberger, DO  levothyroxine (SYNTHROID, LEVOTHROID) 25 MCG tablet Take 25 mcg by mouth daily before breakfast.   Yes [provider]  PARoxetine (PAXIL) 10 MG tablet Take 10 mg by mouth daily.   Yes [provider]  PHENobarbital (LUMINAL) 97.2 MG tablet Take 1 tablet (97.2 mg total) by mouth at bedtime. 11/13/14  Yes Dondiego, Delfino Lovett, MD  QUEtiapine (SEROQUEL) 300 MG tablet Take 1 tablet by mouth daily. 09/22/17  Yes [provider]  risperiDONE (RISPERDAL) 1 MG tablet Take 1 tablet by mouth daily. 09/22/17  Yes [provider]    Scheduled Meds: . [START ON  09/26/2017] amLODipine  5 mg Oral Daily  . aspirin  300 mg Rectal Daily   Or  . aspirin  325 mg Oral Daily  . atorvastatin  40 mg Oral QPM  . cholecalciferol  400 Units Oral Daily  . [START ON 09/26/2017] cloNIDine  0.1 mg Oral BID  . enoxaparin (LOVENOX) injection  40 mg Subcutaneous Q24H  . levETIRAcetam  500 mg Oral BID  . levothyroxine  25 mcg Oral QAC breakfast  . nicotine  21 mg Transdermal Daily  . PARoxetine  10 mg Oral Daily  . PHENobarbital  97.2 mg Oral QHS  . QUEtiapine  100 mg Oral QHS   Continuous Infusions: . sodium chloride 10 mL/hr at 09/24/17 1849   PRN Meds:.acetaminophen **OR** acetaminophen (TYLENOL) oral liquid 160 mg/5 mL **OR** acetaminophen, haloperidol lactate, hydrALAZINE, senna-docusate     Results for orders placed or performed during the hospital encounter of 09/24/17 (from the past 48 hour(s))  Ethanol  Status: None   Collection Time: 09/24/17 10:23 AM  Result Value Ref Range   Alcohol, Ethyl (B) <10 <10 mg/dL    Comment:        LOWEST DETECTABLE LIMIT FOR SERUM ALCOHOL IS 10 mg/dL FOR MEDICAL PURPOSES ONLY Performed at [facility], [address], [place], [place] [address]   Protime-INR     Status: None   Collection Time: 09/24/17 10:23 AM  Result Value Ref Range   Prothrombin Time 12.2 11.4 - 15.2 seconds   INR 0.92     Comment: Performed at [facility], [address], [place], [place] [address]  APTT     Status: None   Collection Time: 09/24/17 10:23 AM  Result Value Ref Range   aPTT 27 24 - 36 seconds    Comment: Performed at [facility], [address] [place], [place] [address]  CBC     Status: Abnormal   Collection Time: 09/24/17 10:23 AM  Result Value Ref Range   WBC 8.4 4.0 - 10.5 K/uL   RBC 4.03 3.87 - 5.11 MIL/uL   Hemoglobin 12.2 12.0 - 15.0 g/dL   HCT 37.0 36.0 - 46.0 %   MCV 91.8 78.0 - 100.0 fL   MCH 30.3 26.0 - 34.0 pg   MCHC 33.0 30.0 - 36.0 g/dL   RDW 15.7 (H) 11.5 - 15.5 %   Platelets 228 150 - 400 K/uL     Comment: Performed at [facility], [address], [place], [place] [address]  Differential     Status: None   Collection Time: 09/24/17 10:23 AM  Result Value Ref Range   Neutrophils Relative % 54 %   Neutro Abs 4.5 1.7 - 7.7 K/uL   Lymphocytes Relative 36 %   Lymphs Abs 3.0 0.7 - 4.0 K/uL   Monocytes Relative 9 %   Monocytes Absolute 0.8 0.1 - 1.0 K/uL   Eosinophils Relative 1 %   Eosinophils Absolute 0.1 0.0 - 0.7 K/uL   Basophils Relative 0 %   Basophils Absolute 0.0 0.0 - 0.1 K/uL    Comment: Performed at [facility], [address] [place], [place] [address]  Comprehensive metabolic panel     Status: Abnormal   Collection Time: 09/24/17 10:23 AM  Result Value Ref Range   Sodium 142 135 - 145 mmol/L   Potassium 3.9 3.5 - 5.1 mmol/L   Chloride 107 101 - 111 mmol/L   CO2 22 22 - 32 mmol/L   Glucose, Bld 119 (H) 65 - 99 mg/dL   BUN 12 6 - 20 mg/dL   Creatinine, Ser 0.69 0.44 - 1.00 mg/dL   Calcium 9.4 8.9 - 10.3 mg/dL   Total Protein 8.0 6.5 - 8.1 g/dL   Albumin 4.0 3.5 - 5.0 g/dL   AST 15 15 - 41 U/L   ALT 10 (L) 14 - 54 U/L   Alkaline Phosphatase 52 38 - 126 U/L   Total Bilirubin 0.2 (L) 0.3 - 1.2 mg/dL   GFR calc non Af Amer >60 >60 mL/min   GFR calc Af Amer >60 >60 mL/min    Comment: (NOTE) The eGFR has been calculated using the CKD EPI equation. This calculation has not been validated in all clinical situations. eGFR's persistently <60 mL/min signify possible Chronic Kidney Disease.    Anion gap 13 5 - 15    Comment: Performed at [facility], [address], [place], [place] [address]  Ammonia     Status: None   Collection  Time: 09/24/17 10:23 AM  Result Value Ref Range   Ammonia 27 9 - 35 umol/L    Comment: Performed at [facility], [address], [place], [place] [address]  Urine rapid drug screen (hosp performed)     Status: Abnormal   Collection Time: 09/24/17 10:26 AM  Result Value Ref Range   Opiates NONE DETECTED NONE DETECTED   Cocaine NONE DETECTED  NONE DETECTED   Benzodiazepines NONE DETECTED NONE DETECTED   Amphetamines NONE DETECTED NONE DETECTED   Tetrahydrocannabinol NONE DETECTED NONE DETECTED   Barbiturates POSITIVE (A) NONE DETECTED    Comment: (NOTE) DRUG SCREEN FOR MEDICAL PURPOSES ONLY.  IF CONFIRMATION IS NEEDED FOR ANY PURPOSE, NOTIFY LAB WITHIN 5 DAYS. LOWEST DETECTABLE LIMITS FOR URINE DRUG SCREEN Drug Class                     Cutoff (ng/mL) Amphetamine and metabolites    1000 Barbiturate and metabolites    200 Benzodiazepine                 443 Tricyclics and metabolites     300 Opiates and metabolites        300 Cocaine and metabolites        300 THC                            50 Performed at [facility], [address] [place], [place] [address]   Urinalysis, Routine w reflex microscopic     Status: Abnormal   Collection Time: 09/24/17 10:26 AM  Result Value Ref Range   Color, Urine STRAW (A) YELLOW   APPearance CLEAR CLEAR   Specific Gravity, Urine 1.008 1.005 - 1.030   pH 6.0 5.0 - 8.0   Glucose, UA NEGATIVE NEGATIVE mg/dL   Hgb urine dipstick NEGATIVE NEGATIVE   Bilirubin Urine NEGATIVE NEGATIVE   Ketones, ur NEGATIVE NEGATIVE mg/dL   Protein, ur NEGATIVE NEGATIVE mg/dL   Nitrite NEGATIVE NEGATIVE   Leukocytes, UA NEGATIVE NEGATIVE    Comment: Performed at [facility], [address] [place], [place] [address]  Blood gas, venous     Status: Abnormal   Collection Time: 09/24/17 10:27 AM  Result Value Ref Range   pH, Ven 7.408 7.250 - 7.430   pCO2, Ven 39.5 (L) 44.0 - 60.0 mmHg   pO2, Ven 151.0 (H) 32.0 - 45.0 mmHg   Bicarbonate 24.7 20.0 - 28.0 mmol/L   Acid-Base Excess 0.3 0.0 - 2.0 mmol/L   O2 Saturation 98.9 %   Patient temperature 37.0    Collection site VEIN    Drawn by DRAWN BY RN    Sample type VENOUS   CBG monitoring, ED     Status: Abnormal   Collection Time: 09/24/17 10:27 AM  Result Value Ref Range   Glucose-Capillary 111 (H) 65 - 99 mg/dL  I-Stat Chem 8, ED     Status:  Abnormal   Collection Time: 09/24/17 10:35 AM  Result Value Ref Range   Sodium 142 135 - 145 mmol/L   Potassium 4.5 3.5 - 5.1 mmol/L   Chloride 107 101 - 111 mmol/L   BUN 13 6 - 20 mg/dL   Creatinine, Ser 0.60 0.44 - 1.00 mg/dL   Glucose, Bld 115 (H) 65 - 99 mg/dL   Calcium, Ion 1.13 (L) 1.15 - 1.40 mmol/L   TCO2 27 22 - 32 mmol/L   Hemoglobin 12.2 12.0 - 15.0  g/dL   HCT 36.0 36.0 - 46.0 %  TSH     Status: None   Collection Time: 09/24/17  4:13 PM  Result Value Ref Range   TSH 2.294 0.350 - 4.500 uIU/mL    Comment: Performed by a 3rd Generation assay with a functional sensitivity of <=0.01 uIU/mL. Performed at [facility], [address], [place], [place] [address]   Hemoglobin A1c     Status: Abnormal   Collection Time: 09/25/17  6:11 AM  Result Value Ref Range   Hgb A1c MFr Bld 6.1 (H) 4.8 - 5.6 %    Comment: (NOTE) Pre diabetes:          5.7%-6.4% Diabetes:              >6.4% Glycemic control for   <7.0% adults with diabetes    Mean Plasma Glucose 128.37 mg/dL    Comment: Performed at [facility] [address] [place], [place] [address]  Lipid panel     Status: None   Collection Time: 09/25/17  6:16 AM  Result Value Ref Range   Cholesterol 197 0 - 200 mg/dL   Triglycerides 124 <150 mg/dL   HDL 90 >40 mg/dL   Total CHOL/HDL Ratio 2.2 RATIO   VLDL 25 0 - 40 mg/dL   LDL Cholesterol 82 0 - 99 mg/dL    Comment:        Total Cholesterol/HDL:CHD Risk Coronary Heart Disease Risk Table                     Men   Women  1/2 Average Risk   3.4   3.3  Average Risk       5.0   4.4  2 X Average Risk   9.6   7.1  3 X Average Risk  23.4   11.0        Use the calculated Patient Ratio above and the CHD Risk Table to determine the patient's CHD Risk.        ATP III CLASSIFICATION (LDL):  <100     mg/dL   Optimal  100-129  mg/dL   Near or Above                    Optimal  130-159  mg/dL   Borderline  160-189  mg/dL   High  >190     mg/dL   Very High Performed at  [facility], [address] [place], [place] [address]   Vitamin B12     Status: None   Collection Time: 09/25/17  6:16 AM  Result Value Ref Range   Vitamin B-12 309 180 - 914 pg/mL    Comment: (NOTE) This assay is not validated for testing neonatal or myeloproliferative syndrome specimens for Vitamin B12 levels. Performed at [facility], [place] [address] [place], [place] [address]   Sedimentation rate     Status: Abnormal   Collection Time: 09/25/17  2:07 PM  Result Value Ref Range   Sed Rate 42 (H) 0 - 22 mm/hr    Comment: Performed at [facility], [address] [place], [place] [address]  CBC     Status: None   Collection Time: 09/25/17  2:07 PM  Result Value Ref Range   WBC 8.1 4.0 - 10.5 K/uL   RBC 4.30 3.87 - 5.11 MIL/uL   Hemoglobin 13.0 12.0 - 15.0 g/dL   HCT 39.0 36.0 - 46.0 %  MCV 90.7 78.0 - 100.0 fL   MCH 30.2 26.0 - 34.0 pg   MCHC 33.3 30.0 - 36.0 g/dL   RDW 15.3 11.5 - 15.5 %   Platelets 231 150 - 400 K/uL    Comment: Performed at [facility], [address] [place], [place] [address]  Basic metabolic panel     Status: Abnormal   Collection Time: 09/25/17  2:07 PM  Result Value Ref Range   Sodium 136 135 - 145 mmol/L   Potassium 3.6 3.5 - 5.1 mmol/L    Comment: CRITICAL RESULT CALLED TO, READ BACK BY AND VERIFIED WITH: FOLLEY,B AT 1520 ON 2.19.2019 BY ISLEY,B    Chloride 99 (L) 101 - 111 mmol/L   CO2 19 (L) 22 - 32 mmol/L   Glucose, Bld 103 (H) 65 - 99 mg/dL   BUN 14 6 - 20 mg/dL   Creatinine, Ser 0.62 0.44 - 1.00 mg/dL   Calcium 9.6 8.9 - 10.3 mg/dL   GFR calc non Af Amer >60 >60 mL/min   GFR calc Af Amer >60 >60 mL/min    Comment: (NOTE) The eGFR has been calculated using the CKD EPI equation. This calculation has not been validated in all clinical situations. eGFR's persistently <60 mL/min signify possible Chronic Kidney Disease.    Anion gap 18 (H) 5 - 15    Comment: Performed at [facility], [address], [place], [place] [address]      Studies/Results:   BRAIN MRI FINDINGS: INTRACRANIAL CONTENTS: No reduced diffusion to suggest acute ischemia, infection, hypercellular tumor or status epilepticus. Ex vacuo dilatation bilateral occipital horns associated with RIGHT greater than LEFT encephalomalacia. LEFT frontal lobe encephalomalacia better demonstrated on prior CT.  VASCULAR: Nondiagnostic evaluation.  SKULL AND UPPER CERVICAL SPINE: J-shaped sella. Susceptibility artifact LEFT calvarium corresponding to known craniotomy. Craniocervical junction maintained.  SINUSES/ORBITS: Nondiagnostic evaluation.  OTHER: None.  IMPRESSION: 1. Limited 3 sequence noncontrast MRI of the head: No acute intracranial process.     The brain MRI is reviewed in person. It s compared to the previous scan done March 2018 and essentially is unchanged. The current scan is limited because of poor cooperation. However, again there is marked encephalomalacia involving the right occipital region resulting in ventricular dilatation. There is also encephalomalacia small tear involving the left occipital region and the left frontal increased signal involving the FLAIR imaging.   EEG Findings:  There is a posterior dominant rhythm of 11 Hz reactive to eye opening and closure. There is intermittent theta frequency focal slowing in the left temporal lobe.  There are epileptiform discharges with maximal negativity at F7 and spread to T3.  No electrographic seizures are present.  Photic stimulation and hyperventilation are not performed. Sleep is not recorded.    Impression:  This is an abnormal EEG.  There is evidence of cerebral dysfunction in the left temporal lobe as well as a seizure tendency emanating from the left anterior to mid temporal lobe. This puts the patient at risk of focal seizures with or without secondary generalization        Aedan Geimer A. [name], M.D.  Diplomate, Tax adviser of Psychiatry and Neurology (  Neurology). 09/25/2017, 6:15 PM

## 2017-09-25 NOTE — Procedures (Signed)
EEG Report  Clinical History:  Acute onset of dysarthria and confusion.  Multiple previous strokes.    Technical Summary:  A 19 channel digital EEG recording was performed using the 10-20 international system of electrode placement.  Bipolar and Referential montages were used.  The total recording time was approx 20 minutes.  Findings:  There is a posterior dominant rhythm of 11 Hz reactive to eye opening and closure. There is intermittent theta frequency focal slowing in the left temporal lobe.  There are epileptiform discharges with maximal negativity at F7 and spread to T3.  No electrographic seizures are present.  Photic stimulation and hyperventilation are not performed. Sleep is not recorded.    Impression:  This is an abnormal EEG.  There is evidence of cerebral dysfunction in the left temporal lobe as well as a seizure tendency emanating from the left anterior to mid temporal lobe. This puts the patient at risk of focal seizures with or without secondary generalization.    Rogue Jury, MS, MD

## 2017-09-25 NOTE — Progress Notes (Signed)
EEG Completed; Results Pending  

## 2017-09-25 NOTE — Progress Notes (Signed)
Patient was given dose of Haldol x 1 for increased agitation and noncompliance. Shortly thereafter patient became more lethargic and speech a bit more slurred, which was expected. To be safe I checked patient's vital signs, the patient's BP was elevated t 169/113. I rechecked the patient's BP a few times, all values were around the same. I then paged the MD and informed him of the situation since the patient's BP was not high enough for her to get her PRN BP medication and the patient had no scheduled BP medication. The MD gave orders to give a OTO of Norvasc 5 mg PO and Clonidine 0.1 mg PO, as well as orders for these medications to be administered daily. I have given both of the OTO of these medications, am continuing to monitor the patient.

## 2017-09-26 DIAGNOSIS — Z823 Family history of stroke: Secondary | ICD-10-CM | POA: Diagnosis not present

## 2017-09-26 DIAGNOSIS — G40901 Epilepsy, unspecified, not intractable, with status epilepticus: Secondary | ICD-10-CM | POA: Diagnosis present

## 2017-09-26 DIAGNOSIS — E785 Hyperlipidemia, unspecified: Secondary | ICD-10-CM | POA: Diagnosis present

## 2017-09-26 DIAGNOSIS — Z7982 Long term (current) use of aspirin: Secondary | ICD-10-CM | POA: Diagnosis not present

## 2017-09-26 DIAGNOSIS — R471 Dysarthria and anarthria: Secondary | ICD-10-CM | POA: Diagnosis present

## 2017-09-26 DIAGNOSIS — Z800 Family history of malignant neoplasm of digestive organs: Secondary | ICD-10-CM | POA: Diagnosis not present

## 2017-09-26 DIAGNOSIS — I10 Essential (primary) hypertension: Secondary | ICD-10-CM | POA: Diagnosis present

## 2017-09-26 DIAGNOSIS — F17210 Nicotine dependence, cigarettes, uncomplicated: Secondary | ICD-10-CM | POA: Diagnosis present

## 2017-09-26 DIAGNOSIS — Z8673 Personal history of transient ischemic attack (TIA), and cerebral infarction without residual deficits: Secondary | ICD-10-CM | POA: Diagnosis not present

## 2017-09-26 DIAGNOSIS — G9389 Other specified disorders of brain: Secondary | ICD-10-CM | POA: Diagnosis present

## 2017-09-26 DIAGNOSIS — Z79890 Hormone replacement therapy (postmenopausal): Secondary | ICD-10-CM | POA: Diagnosis not present

## 2017-09-26 DIAGNOSIS — G936 Cerebral edema: Secondary | ICD-10-CM | POA: Diagnosis present

## 2017-09-26 DIAGNOSIS — G459 Transient cerebral ischemic attack, unspecified: Secondary | ICD-10-CM | POA: Diagnosis present

## 2017-09-26 DIAGNOSIS — G40A01 Absence epileptic syndrome, not intractable, with status epilepticus: Secondary | ICD-10-CM | POA: Diagnosis present

## 2017-09-26 DIAGNOSIS — J449 Chronic obstructive pulmonary disease, unspecified: Secondary | ICD-10-CM | POA: Diagnosis present

## 2017-09-26 DIAGNOSIS — D329 Benign neoplasm of meninges, unspecified: Secondary | ICD-10-CM | POA: Diagnosis present

## 2017-09-26 DIAGNOSIS — E039 Hypothyroidism, unspecified: Secondary | ICD-10-CM | POA: Diagnosis present

## 2017-09-26 DIAGNOSIS — Z885 Allergy status to narcotic agent status: Secondary | ICD-10-CM | POA: Diagnosis not present

## 2017-09-26 DIAGNOSIS — Z90710 Acquired absence of both cervix and uterus: Secondary | ICD-10-CM | POA: Diagnosis not present

## 2017-09-26 DIAGNOSIS — T426X5A Adverse effect of other antiepileptic and sedative-hypnotic drugs, initial encounter: Secondary | ICD-10-CM | POA: Diagnosis present

## 2017-09-26 DIAGNOSIS — G9349 Other encephalopathy: Secondary | ICD-10-CM | POA: Diagnosis present

## 2017-09-26 DIAGNOSIS — M24549 Contracture, unspecified hand: Secondary | ICD-10-CM | POA: Diagnosis present

## 2017-09-26 LAB — BASIC METABOLIC PANEL
Anion gap: 12 (ref 5–15)
BUN: 17 mg/dL (ref 6–20)
CALCIUM: 9.2 mg/dL (ref 8.9–10.3)
CO2: 23 mmol/L (ref 22–32)
CREATININE: 0.68 mg/dL (ref 0.44–1.00)
Chloride: 103 mmol/L (ref 101–111)
GFR calc Af Amer: >60 mL/min (ref >60)
Glucose, Bld: 98 mg/dL (ref 65–99)
Potassium: 3.6 mmol/L (ref 3.5–5.1)
SODIUM: 138 mmol/L (ref 135–145)

## 2017-09-26 LAB — HIV ANTIBODY (ROUTINE TESTING W REFLEX): HIV SCREEN 4TH GENERATION: NONREACTIVE

## 2017-09-26 LAB — RPR: RPR Ser Ql: NONREACTIVE

## 2017-09-26 MED ORDER — QUETIAPINE FUMARATE 25 MG PO TABS
50.0000 mg | ORAL_TABLET | Freq: Every day | ORAL | Status: DC
  Administered 2017-09-26 – 2017-09-27 (×2): 50 mg via ORAL
  Filled 2017-09-26 (×2): qty 2

## 2017-09-26 MED ORDER — VALPROATE SODIUM 500 MG/5ML IV SOLN
INTRAVENOUS | Status: AC
  Filled 2017-09-26: qty 5

## 2017-09-26 NOTE — Clinical Social Work Note (Signed)
[employer] authorization started with [employer].     [name], Clydene Pugh, LCSW

## 2017-09-26 NOTE — Plan of Care (Signed)
  No Outcome Acute Rehab OT Goals (only OT should resolve) Pt. Will Perform Eating 09/26/2017 1119 by Debby Bud, OT Flowsheets Taken 09/26/2017 1114  Pt Will Perform Eating with set-up;sitting;with supervision Pt. Will Perform Grooming 09/26/2017 1119 by Debby Bud, OT Flowsheets Taken 09/26/2017 1114  Pt Will Perform Grooming with set-up;with supervision;sitting Pt. Will Transfer To Toilet 09/26/2017 1119 by Debby Bud, OT Flowsheets Taken 09/26/2017 1114  Pt Will Transfer to Toilet with supervision;bedside commode OT Additional ADL Goal #1 09/26/2017 1119 by Lakrista Scaduto, Clarene Duke, OT Flowsheets Taken 09/26/2017 1114  Additional ADL Goal #1 patient will increase gross motor coordination during feeding task while increasing proprioception to the right side with VC as needed.

## 2017-09-26 NOTE — Evaluation (Signed)
Occupational Therapy Evaluation Patient Details Name: [name] MRN: [id number] DOB: Dec 12, 1945 Today's Date: 09/26/2017    History of Present Illness [name] is a 72 y.o. female with medical history of hypertension not on medications, seizure disorder, stroke, GI bleed presenting with acute onset of dysarthria and confusion around 9:30 in the morning on 09/24/2017.  The patient has been in her usual state of health up until that point.  Presently, the patient is unable to provide any history secondary to her confusion.  All of his history is obtained from speaking with EDP, husband, and review of the medical record.  Around 9:30 AM on 09/24/2017, the patient had acute onset of numbness in her right upper extremity with associated weakness and slurred speech.  In addition, the patient was confused according to the patient's family.  Prior to EMS arrival, the patient somewhat improved.  However upon EMS arrival, the patient's dysarthria came back.  The patient was started on risperidone approximately 1 month prior to this admission, but has not been on any other new medications.  There is been no reports of fevers, chills, chest pain, shortness breath, cough, hemoptysis, nausea, vomiting, diarrhea.    Clinical Impression   Pt agreeable to participate in OT evaluation. Niece present during evaluation and was able to provide some insight on new deficits. During evaluation, patient's vision was inconsistent. She preferred her head extended when seated in bed and complained of neck pain to the right side. Once seated in chair she was able  To maintain a neutral head position. Pt with functional strength in RUE. Had inconsistent difficulty with locating spoon, food, therapist, family member during session. Unsure if this issue is cognitive related or vision related. Patient had difficulty with tracking therapist's finger smoothly through a horizontal plane.     Follow Up Recommendations  SNF     Equipment Recommendations  None recommended by OT       Precautions / Restrictions Precautions Precautions: Fall Restrictions Weight Bearing Restrictions: No      Mobility Bed Mobility Overal bed mobility: Needs Assistance Bed Mobility: Supine to Sit     Supine to sit: Min assist        Transfers Overall transfer level: Needs assistance Equipment used: 1 person hand held assist;None Transfers: Sit to/from Stand Sit to Stand: Min guard         General transfer comment: required physical, verbal, and tactile cueing to turn towards chair (sequencing)        ADL either performed or assessed with clinical judgement   ADL Overall ADL's : Needs assistance/impaired Eating/Feeding: Moderate assistance;Sitting;Cueing for sequencing;Cueing for compensatory techinques Eating/Feeding Details (indicate cue type and reason): Pt had difficulty that was inconsistant with use of eating utensil, locating spoon, holding water cup and drinking through straw, etc.                 Lower Body Dressing: Total assistance;Bed level Lower Body Dressing Details (indicate cue type and reason): donning hospital socks                     Vision Baseline Vision/History: No visual deficits Patient Visual Report: (Difficult to determine.) Vision Assessment?: Yes;Vision impaired- to be further tested in functional context Alignment/Gaze Preference: Head tilt Tracking/Visual Pursuits: Decreased smoothness of horizontal tracking Saccades: Additional eye shifts occurred during testing            Pertinent Vitals/Pain Pain Assessment: Faces Faces Pain Scale: Hurts even more Pain  Location: right side of neck Pain Intervention(s): Repositioned;Monitored during session;Limited activity within patient's tolerance     Hand Dominance Right   Extremity/Trunk Assessment Upper Extremity Assessment Upper Extremity Assessment: LUE deficits/detail(RUE strength is 4+/5. Grip strength is  functional. ) LUE Deficits / Details: Unable to use LUE due to previous stroke. This is baseline. patient with flexor synergy present.    Lower Extremity Assessment Lower Extremity Assessment: Defer to PT evaluation       Communication Communication Communication: HOH   Cognition Arousal/Alertness: Awake/alert Behavior During Therapy: WFL for tasks assessed/performed Overall Cognitive Status: Impaired/Different from baseline Area of Impairment: Awareness;Attention                                              Home Living Family/patient expects to be discharged to:: Skilled nursing facility Living Arrangements: Spouse/significant other Available Help at Discharge: Family(Neice PRN. She works.) Type of Home: House Home Access: Stairs to enter Technical brewer of Steps: 3 Entrance Stairs-Rails: Right Home Layout: One level           Bathroom Accessibility: Yes   Home Equipment: Shower seat          Prior Functioning/Environment Level of Independence: Needs assistance  Gait / Transfers Assistance Needed: Supervised household gait ADL's / Homemaking Assistance Needed: Pt requires assistance with bathing and dressing.            OT Problem List: Impaired vision/perception;Decreased coordination;Decreased activity tolerance;Decreased safety awareness;Pain      OT Treatment/Interventions: Self-care/ADL training;Modalities;Therapeutic exercise;Therapeutic activities;Neuromuscular education;Visual/perceptual remediation/compensation;DME and/or AE instruction;Patient/family education;Manual therapy;Energy conservation;Cognitive remediation/compensation    OT Goals(Current goals can be found in the care plan section) Acute Rehab OT Goals Patient Stated Goal: return home OT Goal Formulation: With patient Time For Goal Achievement: 10/10/17 Potential to Achieve Goals: Good  OT Frequency: Min 2X/week    AM-PAC PT "6 Clicks" Daily Activity      Outcome Measure Help from another person eating meals?: A Lot Help from another person taking care of personal grooming?: A Lot Help from another person toileting, which includes using toliet, bedpan, or urinal?: Total Help from another person bathing (including washing, rinsing, drying)?: Total Help from another person to put on and taking off regular upper body clothing?: Total Help from another person to put on and taking off regular lower body clothing?: Total 6 Click Score: 8   End of Session Equipment Utilized During Treatment: Gait belt  Activity Tolerance: Patient tolerated treatment well Patient left: in chair;with call bell/phone within reach;with family/visitor present  OT Visit Diagnosis: Muscle weakness (generalized) (M62.81)                Time: 2683-4196 OT Time Calculation (min): 35 min Charges:  OT General Charges $OT Visit: 1 Visit OT Evaluation $OT Eval Moderate Complexity: 1 Mod G-Codes:     Ailene Ravel, OTR/L,CBIS  [phone]   Chrisotpher Rivero, Clarene Duke 09/26/2017, 10:35 AM

## 2017-09-26 NOTE — Progress Notes (Signed)
Physical Therapy Treatment Patient Details Name: [name] MRN: [id number] DOB: 1946-04-25 Today's Date: 09/26/2017    History of Present Illness [name] is a 72 y.o. female with medical history of hypertension not on medications, seizure disorder, stroke, GI bleed presenting with acute onset of dysarthria and confusion around 9:30 in the morning on 09/24/2017.  The patient has been in her usual state of health up until that point.  Presently, the patient is unable to provide any history secondary to her confusion.  All of his history is obtained from speaking with EDP, husband, and review of the medical record.  Around 9:30 AM on 09/24/2017, the patient had acute onset of numbness in her right upper extremity with associated weakness and slurred speech.  In addition, the patient was confused according to the patient's family.  Prior to EMS arrival, the patient somewhat improved.  However upon EMS arrival, the patient's dysarthria came back.  The patient was started on risperidone approximately 1 month prior to this admission, but has not been on any other new medications.  There is been no reports of fevers, chills, chest pain, shortness breath, cough, hemoptysis, nausea, vomiting, diarrhea.     PT Comments    Patient has difficulty with coordination when completing BLE ROM strengthening exercises while seated in chair requiring constant verbal cueing for all exercises and tactile cueing to complete marching in place.  Patient demonstrates increased tolerance/endurance for gait training with less usage of side rails, but requires frequent assistance by writer using gait belt.  Patient tolerated staying up in chair with family members present after therapy.  Patient will benefit from continued physical therapy in hospital and recommended venue below to increase strength, balance, endurance for safe ADLs and gait.   Follow Up Recommendations  SNF;Supervision/Assistance - 24 hour      Equipment Recommendations  Cane    Recommendations for Other Services       Precautions / Restrictions Precautions Precautions: Fall Restrictions Weight Bearing Restrictions: No    Mobility  Bed Mobility               General bed mobility comments: Patient presents up in chair  Transfers Overall transfer level: Needs assistance Equipment used: 1 person hand held assist Transfers: Sit to/from Stand;Stand Pivot Transfers Sit to Stand: Min guard Stand pivot transfers: Min assist       General transfer comment: very unsteady with verbal cueing for proper hand placement  Ambulation/Gait Ambulation/Gait assistance: Min assist;Mod assist Ambulation Distance (Feet): 40 Feet Assistive device: 1 person hand held assist Gait Pattern/deviations: Decreased step length - right;Decreased step length - left;Decreased stride length;Drifts right/left;Wide base of support;Ataxic   Gait velocity interpretation: Below normal speed for age/gender General Gait Details: Patient demonstrates extremely slow labored cadence with difficulty taking longer steps due to fear of losing balance, no LUE armswing, limited with RUE after verbal cues, requires much time   Stairs            Wheelchair Mobility    Modified Rankin (Stroke Patients Only)       Balance Overall balance assessment: Needs assistance Sitting-balance support: No upper extremity supported;Feet supported Sitting balance-Leahy Scale: Fair     Standing balance support: No upper extremity supported;During functional activity Standing balance-Leahy Scale: Poor Standing balance comment: fair/poor with hand held assist                            Cognition  Arousal/Alertness: Awake/alert Behavior During Therapy: WFL for tasks assessed/performed Overall Cognitive Status: Impaired/Different from baseline                                        Exercises General Exercises - Lower  Extremity Ankle Circles/Pumps: Seated;AROM;Strengthening;Both;10 reps Long Arc Quad: Seated;AROM;Strengthening;Both;10 reps Hip Flexion/Marching: Seated;AAROM;Strengthening;Both;10 reps Toe Raises: Seated;AROM;Strengthening;Both;10 reps Heel Raises: Seated;AROM;Strengthening;Both;10 reps    General Comments        Pertinent Vitals/Pain Pain Assessment: No/denies pain    Home Living                      Prior Function            PT Goals (current goals can now be found in the care plan section) Acute Rehab PT Goals Patient Stated Goal: return home PT Goal Formulation: With patient/family Time For Goal Achievement: 10/09/17 Potential to Achieve Goals: Good Progress towards PT goals: Progressing toward goals    Frequency    7X/week      PT Plan Current plan remains appropriate    Co-evaluation              AM-PAC PT "6 Clicks" Daily Activity  Outcome Measure  Difficulty turning over in bed (including adjusting bedclothes, sheets and blankets)?: A Little Difficulty moving from lying on back to sitting on the side of the bed? : A Little Difficulty sitting down on and standing up from a chair with arms (e.g., wheelchair, bedside commode, etc,.)?: A Little Help needed moving to and from a bed to chair (including a wheelchair)?: A Lot Help needed walking in hospital room?: A Lot Help needed climbing 3-5 steps with a railing? : A Lot 6 Click Score: 15    End of Session Equipment Utilized During Treatment: Gait belt Activity Tolerance: Patient limited by fatigue Patient left: in chair;with call bell/phone within reach;with chair alarm set;with family/visitor present Nurse Communication: Mobility status PT Visit Diagnosis: Unsteadiness on feet (R26.81);Other abnormalities of gait and mobility (R26.89);Muscle weakness (generalized) (M62.81)     Time: 2500-3704 PT Time Calculation (min) (ACUTE ONLY): 24 min  Charges:  $Gait Training: 8-22  mins $Therapeutic Exercise: 8-22 mins                    G Codes:       2:53 PM, 10-17-17 Lonell Grandchild, MPT Physical Therapist with [facility] [phone] office [phone] mobile phone

## 2017-09-26 NOTE — Progress Notes (Signed)
[name] BOF:[id number] DOB: 06/30/1946 DOA: 09/24/2017 PCP: Lucia Gaskins, MD   Physical Exam: Blood pressure 131/81, pulse 89, temperature 99.2 F (37.3 C), temperature source Axillary, resp. rate 17, height 5\' 6"  (1.676 m), weight 50.2 kg (110 lb 10.7 oz), SpO2 100 %.lungs - Clear . No rales , wheeeze or ronchi. Heart- Reg Rhythm no s3 s4 No HTR.   Investigations:  No results found for this or any previous visit (from the past 240 hour(s)).   Basic Metabolic Panel: Recent Labs    09/25/17 1407 09/26/17 0549  NA 136 138  K 3.6 3.6  CL 99* 103  CO2 19* 23  GLUCOSE 103* 98  BUN 14 17  CREATININE 0.62 0.68  CALCIUM 9.6 9.2   Liver Function Tests: Recent Labs    09/24/17 1023  AST 15  ALT 10*  ALKPHOS 52  BILITOT 0.2*  PROT 8.0  ALBUMIN 4.0     CBC: Recent Labs    09/24/17 1023 09/24/17 1035 09/25/17 1407  WBC 8.4  --  8.1  NEUTROABS 4.5  --   --   HGB 12.2 12.2 13.0  HCT 37.0 36.0 39.0  MCV 91.8  --  90.7  PLT 228  --  231    Mr Brain Wo Contrast  Result Date: 09/24/2017 CLINICAL DATA:  Altered level of consciousness, slurred speech and difficulty moving RIGHT arm, now resolved. Confusion and agitation. History of meningioma resection. EXAM: MRI HEAD WITHOUT CONTRAST TECHNIQUE: Sagittal T1, axial coronal diffusion weighted imaging. Patient discontinued examination by climbing out of scanner. COMPARISON:  CT HEAD September 24, 2017 and MRI of the head October 30, 2016 FINDINGS: INTRACRANIAL CONTENTS: No reduced diffusion to suggest acute ischemia, infection, hypercellular tumor or status epilepticus. Ex vacuo dilatation bilateral occipital horns associated with RIGHT greater than LEFT encephalomalacia. LEFT frontal lobe encephalomalacia better demonstrated on prior CT. VASCULAR: Nondiagnostic evaluation. SKULL AND UPPER CERVICAL SPINE: J-shaped sella. Susceptibility artifact LEFT calvarium corresponding to known craniotomy. Craniocervical junction  maintained. SINUSES/ORBITS: Nondiagnostic evaluation. OTHER: None. IMPRESSION: 1. Limited 3 sequence noncontrast MRI of the head: No acute intracranial process. Electronically Signed   By: Elon Alas M.D.   On: 09/24/2017 19:23      Medications:  Impression:  Active Problems:   Acute encephalopathy   Dysarthria     Plan:Discontinue haldol . Decrease seroquel from 100 mg. To 50 mg. PO hs. Continue norvasc 5 mg. Poqd. And clonidine 0.1 mg. Po BID.  Consultants: Neurology    Procedures   Antibiotics:           Time spent:30 min   LOS: 0 days   Yides Saidi M   09/26/2017, 12:23 PM

## 2017-09-26 NOTE — Evaluation (Signed)
Speech Language Pathology Evaluation Patient Details Name: [name] MRN: [id number] DOB: [birth year] Today's Date: 09/26/2017 Time: 2878-6767 SLP Time Calculation (min) (ACUTE ONLY): 22 min  Problem List:  Patient Active Problem List   Diagnosis Date Noted  . Seizure disorder, nonconvulsive, with status epilepticus ([place]) 09/26/2017  . Dysarthria 09/24/2017  . GI bleed 12/01/2016  . Acute blood loss anemia 12/01/2016  . Lower GI bleed   . Hemi-neglect of right side   . Hypothyroidism 01/19/2016  . Depression 01/19/2016  . Speech disturbance 01/19/2016  . Hyponatremia 01/19/2016  . Acute encephalopathy 01/19/2016  . Meningioma ([place]) 11/12/2014  . HTN (hypertension) 11/10/2014  . Tobacco use 11/10/2014  . History of CVA (cerebrovascular accident) 05/03/2014   Past Medical History:  Past Medical History:  Diagnosis Date  . Aphasia   . GI bleed 12/01/2016  . Hypertension   . Seizures ([place])   . Stroke [place]) 2009, 2015 9/27, 11/2014  . Thyroid disease    Past Surgical History:  Past Surgical History:  Procedure Laterality Date  . ABDOMINAL HYSTERECTOMY    . COLONOSCOPY N/A 12/03/2016   Procedure: COLONOSCOPY;  Surgeon: Daneil Dolin, MD;  Location: [facility];  Service: Endoscopy;  Laterality: N/A;  . CRANIOTOMY Left 06/18/2015   Procedure: CRANIOTOMY TUMOR EXCISION w/Curve;  Surgeon: Consuella Lose, MD;  Location: [facility];  Service: Neurosurgery;  Laterality: Left;  stereotactic Left Craniotomy for resection of meningioma  . PEG TUBE PLACEMENT    . PEG TUBE REMOVAL     HPI:  [name] is a 72 y.o. female with medical history of hypertension not on medications, seizure disorder, stroke, GI bleed presenting with acute onset of dysarthria and confusion around 9:30 in the morning on 09/24/2017.  The patient has been in her usual state of health up until that point.  Presently, the patient is unable to provide any history secondary to her confusion.  All of  his history is obtained from speaking with EDP, husband, and review of the medical record.  Around 9:30 AM on 09/24/2017, the patient had acute onset of numbness in her right upper extremity with associated weakness and slurred speech.  In addition, the patient was confused according to the patient's family.  Prior to EMS arrival, the patient somewhat improved.  However upon EMS arrival, the patient's dysarthria came back.  The patient was started on risperidone approximately 1 month prior to this admission, but has not been on any other new medications.  There is been no reports of fevers, chills, chest pain, shortness breath, cough, hemoptysis, nausea, vomiting, diarrhea.     Assessment / Plan / Recommendation Clinical Impression  Pt presents with mild/mod cognitive linguistic impairment characterized by dysarthria and memory deficits. Pt has supervision at home per family due to previous neurological insults, but communicated independently. Recommend f/u SLP services at next venue of care to address the above deficits. Pt's upper dentures are ill-fitting and she demonstrated some difficulty masticating solid foods, will downgrade to D2/chopped and continue thin liquids. Pt in agreement with plan of care.     SLP Assessment  SLP Recommendation/Assessment: All further Speech Lanaguage Pathology  needs can be addressed in the next venue of care SLP Visit Diagnosis: Dysarthria and anarthria (R47.1)    Follow Up Recommendations  Home health SLP;Skilled Nursing facility    Frequency and Duration min 2x/week  1 week      SLP Evaluation Cognition  Overall Cognitive Status: Impaired/Different from baseline Arousal/Alertness: Awake/alert  Orientation Level: Oriented X4 Memory: Impaired Memory Impairment: Prospective memory Awareness: Appears intact Problem Solving: Impaired Problem Solving Impairment: Verbal complex Executive Function: Self Monitoring Self Monitoring: Impaired Self Monitoring  Impairment: Functional complex Safety/Judgment: Impaired       Comprehension  Auditory Comprehension Overall Auditory Comprehension: Appears within functional limits for tasks assessed Yes/No Questions: Within Functional Limits Commands: Within Functional Limits Conversation: Simple Interfering Components: Working Curator: Not tested Reading Comprehension Reading Status: Not tested    Expression Expression Primary Mode of Expression: Verbal Verbal Expression Overall Verbal Expression: Impaired Initiation: No impairment Automatic Speech: Name;Social Response Level of Generative/Spontaneous Verbalization: Conversation Repetition: No impairment Naming: No impairment Pragmatics: No impairment Interfering Components: Speech intelligibility Non-Verbal Means of Communication: Not applicable Written Expression Dominant Hand: Right Written Expression: Not tested   Oral / Motor  Oral Motor/Sensory Function Overall Oral Motor/Sensory Function: Generalized oral weakness Facial ROM: Within Functional Limits Facial Symmetry: Within Functional Limits Facial Strength: Within Functional Limits Facial Sensation: Within Functional Limits Lingual ROM: Reduced right;Reduced left;Suspected CN XII (hypoglossal) dysfunction Lingual Symmetry: Within Functional Limits Lingual Strength: Reduced;Suspected CN XII (hypoglossal) dysfunction Lingual Sensation: Within Functional Limits Velum: Within Functional Limits Mandible: Within Functional Limits Motor Speech Overall Motor Speech: Impaired Respiration: Within functional limits Phonation: Normal Resonance: Within functional limits Articulation: Impaired Level of Impairment: Sentence Intelligibility: Intelligibility reduced Word: 75-100% accurate Phrase: 75-100% accurate Sentence: 75-100% accurate Conversation: 75-100% accurate Motor Planning: Witnin functional limits Motor Speech Errors:  Unaware Interfering Components: Inadequate dentition;Hearing loss(ill fitting dentures) Effective Techniques: Over-articulate;Slow rate   Thank you,  [name], [place]                     [place] 09/26/2017, 3:03 PM

## 2017-09-26 NOTE — Progress Notes (Signed)
Bicknell A. Brandi Laughter, MD     [email or web address]          [name] is an 72 y.o. female.   ASSESSMENT/PLAN: 1. 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 2. Significant behavioral disruption which I believe is in part due to multiple psychotropic medications including Keppra: 3. Remote infarcts: The patient should be in a full dose aspirin.   RECOMMENDATION: 1. We will start the patient on IV valproic acid. We'll give her thousand milligrams and started maintenance dose of 500 mg 3 times a day. I suspect that a lot of the behavioral issues are due to [place] which is known to cause bradycardia rhythm disruption. When she is on a good dose of valproic acid, I will discontinue the Keppra. We should try to minimize her psychotropic medications. Consider reducing the dose of the Seroquel and Haldol.     She seemed to responded well to IV valproic acid. No side effects are reported. She still is having episodic confusion but much less and much more responsive today.   GENERAL: This a very pleasant thin female in no acute distress. She is quite bothersome about the fact that she has difficulty speaking.  HEENT: Supple. Atraumatic normocephalic.   ABDOMEN: soft  EXTREMITIES: No edema   BACK: Normal.  SKIN: Normal by inspection.    MENTAL STATUS: She is sitting up try to eat breakfast. She is more responsive and her dysarthria has improved significantly.  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  MOTOR: She has a left upper extremity monoparesis. Deltoid graded as 4/5. Hand grip to 2/5 with significant increased tone/spasticity associated with contracture of the hand. The other extremities shows normal tone, bulk and strength.  COORDINATION: Left finger to nose is normal, right finger to nose is normal, No rest tremor; no intention tremor; no postural tremor; no bradykinesia.  REFLEXES: Deep tendon reflexes are symmetrical and normal, but slightly diminished in the legs.   SENSATION: Normal to light touch.                         [[[[[[[[[[[MY PRIOR NOTE 2016 1. Recurrent episodes of dysphagia/speech impairment and confusion with negative imaging for ischemic events. Post-lesional phenomena seems most likely particularly epileptic events. 2. Baseline history of epilepsy apparently well-controlled although these recurrent events are suspicious for seizures. 3. Left frontal meningioma which is significant given the size and location potentially causing aphasia and seizures. 4. 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 There is also history of intracranial hemorrhage however.  RECOMMENDATION: Given her subtherapeutic phenobarbital levels, the dose of phenobarbital will be increased. We'll go ahead and recheck a level tonight. EEG. One-time dose of Decadron given the mild vasogenic edema associated with the meningioma. Out patient neurosurgical consultation for excision of the meningioma. Speech evaluation/ swallowing evaluation. 30 day event monitor for possible cardioembolic events causing the previous large vessel chronic infarcts.   The patient is 72 year old black female who presents with the acute onset of  impaired speech and confusion. The patient had a similar event about 6 months ago had a very extensive  workup at that time including MRI/MRA, carotid and echocardiography. The workup was unrevealing at that time. We did have suspicion that the events could've been an epileptic event and she was to get her phenobarbital increase but it appears this was not done. She also was passed in outpatient follow-up but this was not done. The patient at baseline seems to function fairly well with the baseline mild to moderate dysarthria per the niece. She is able to talk relatively fluently until last night when she developed the acute dysarthria/aphasia. Her symptoms lasted for several hours but has improved. However, the niece reports that she's not baseline. She did have a bedside swallowing test last night but apparently failed and she has been kept nothing by mouth. She does not report worsening focal neurological symptoms. 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 No loss of consciousness reported. No chest pain, shortness of breath, GI GU symptoms. The review of systems is otherwise negative.      [[[[[[[[[[[[[[[[[MY PRIOR NOTE 04-08-14 1. Acute altered mental status Of unclear etiology. However, given the previous large vessel cortical infarcts and meningioma, seizure is suspected.  2. Moderate size left frontal extra-axial mass consistent with a meningioma. This is associated with the moderate vasogenic edema.  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  4. 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 ( Follow with repeat Brain CTA 12 months)    Increase phenobarbital. I think we should increase the phenobarbital tone rather than adding what the so-called new or safer agents. She has been well controlled on phenobarbital for many years and without side effects.  Likely trial of low-dose steroids. Decadron or prednisone should be sufficient.   Outpatient  neurosurgical consultation for the meningioma.   Given the high likelihood of cardioembolic strokes, A 30 day event monitor/Holter monitor to evaluate for atrial fibrillation is suggested.  ]]]]]]]]]]]]            Blood pressure 131/81, pulse 89, temperature 99.2 F (37.3 C), temperature source Axillary, resp. rate 17, height 5' 6"  (1.676 m), weight 110 lb 10.7 oz (50.2 kg), SpO2 100 %.  Past Medical History:  Diagnosis Date  . Aphasia   . GI bleed 12/01/2016  . Hypertension   . Seizures ([place])   . Stroke [facility]) 2009, 2015 9/27, 11/2014  . Thyroid disease     Past Surgical History:  Procedure Laterality Date  . ABDOMINAL HYSTERECTOMY    . COLONOSCOPY N/A 12/03/2016   Procedure: COLONOSCOPY;  Surgeon: Daneil Dolin, MD;  [facility];  Service: Endoscopy;  Laterality: N/A;  . CRANIOTOMY Left 06/18/2015   Procedure: CRANIOTOMY TUMOR EXCISION w/Curve;  Surgeon: Consuella Lose, MD;  Location: [place] NEURO ORS;  Service: Neurosurgery;  Laterality: Left;  stereotactic Left Craniotomy for resection of meningioma  . PEG TUBE PLACEMENT    . PEG TUBE REMOVAL      Family History  Problem Relation Age of Onset  . Coronary artery disease Unknown        Stents   . Stroke Father        Hemorrhagic   . Sudden death Father   . Ulcers Mother        Bleeding  . Colon cancer Sister     Social History:  reports that  she has been smoking cigarettes.  She has a 75.00 pack-year smoking history. she has never used smokeless tobacco. She reports that she does not drink alcohol or use drugs.  Allergies:  Allergies  Allergen Reactions  . Codeine Nausea And Vomiting and Rash    Medications: Prior to Admission medications   Medication Sig Start Date End Date Taking? Authorizing Provider  amitriptyline (ELAVIL) 10 MG tablet Take 20 mg by mouth at bedtime.   Yes [provider]  aspirin EC 81 MG tablet Take 81 mg by mouth every evening.   Yes [provider]    atorvastatin (LIPITOR) 40 MG tablet Take 40 mg by mouth every evening.    Yes [provider]  cholecalciferol (VITAMIN D) 400 units TABS tablet Take 400 Units by mouth daily.   Yes [provider]  levETIRAcetam (KEPPRA) 500 MG tablet Take 1 tablet (500 mg total) by mouth 2 (two) times daily. 01/21/16  Yes Vann, Tomi Bamberger, DO  levothyroxine (SYNTHROID, LEVOTHROID) 25 MCG tablet Take 25 mcg by mouth daily before breakfast.   Yes [provider]  PARoxetine (PAXIL) 10 MG tablet Take 10 mg by mouth daily.   Yes [provider]  PHENobarbital (LUMINAL) 97.2 MG tablet Take 1 tablet (97.2 mg total) by mouth at bedtime. 11/13/14  Yes Dondiego, Delfino Lovett, MD  QUEtiapine (SEROQUEL) 300 MG tablet Take 1 tablet by mouth daily. 09/22/17  Yes [provider]  risperiDONE (RISPERDAL) 1 MG tablet Take 1 tablet by mouth daily. 09/22/17  Yes [provider]    Scheduled Meds: . amLODipine  5 mg Oral Daily  . aspirin  300 mg Rectal Daily   Or  . aspirin  325 mg Oral Daily  . atorvastatin  40 mg Oral QPM  . cholecalciferol  400 Units Oral Daily  . cloNIDine  0.1 mg Oral BID  . enoxaparin (LOVENOX) injection  40 mg Subcutaneous Q24H  . levETIRAcetam  500 mg Oral BID  . levothyroxine  25 mcg Oral QAC breakfast  . nicotine  21 mg Transdermal Daily  . PARoxetine  10 mg Oral Daily  . PHENobarbital  97.2 mg Oral QHS  . QUEtiapine  100 mg Oral QHS   Continuous Infusions: . sodium chloride 10 mL/hr at 09/26/17 0400  . valproate sodium Stopped (09/26/17 0550)   PRN Meds:.acetaminophen **OR** acetaminophen (TYLENOL) oral liquid 160 mg/5 mL **OR** acetaminophen, haloperidol lactate, hydrALAZINE, senna-docusate     Results for orders placed or performed during the hospital encounter of 09/24/17 (from the past 48 hour(s))  Ethanol     Status: None   Collection Time: 09/24/17 10:23 AM  Result Value Ref Range   Alcohol, Ethyl (B) <10 <10 mg/dL    Comment:         LOWEST DETECTABLE LIMIT FOR SERUM ALCOHOL IS 10 mg/dL FOR MEDICAL PURPOSES ONLY Performed at [facility], [address], [place], [place] [address]   Protime-INR     Status: None   Collection Time: 09/24/17 10:23 AM  Result Value Ref Range   Prothrombin Time 12.2 11.4 - 15.2 seconds   INR 0.92     Comment: Performed at [facility], [address], [place], [place] [address]  APTT     Status: None   Collection Time: 09/24/17 10:23 AM  Result Value Ref Range   aPTT 27 24 - 36 seconds    Comment: Performed at [facility], [address], [place], [place] [address]  CBC  Status: Abnormal   Collection Time: 09/24/17 10:23 AM  Result Value Ref Range   WBC 8.4 4.0 - 10.5 K/uL   RBC 4.03 3.87 - 5.11 MIL/uL   Hemoglobin 12.2 12.0 - 15.0 g/dL   HCT 37.0 36.0 - 46.0 %   MCV 91.8 78.0 - 100.0 fL   MCH 30.3 26.0 - 34.0 pg   MCHC 33.0 30.0 - 36.0 g/dL   RDW 15.7 (H) 11.5 - 15.5 %   Platelets 228 150 - 400 K/uL    Comment: Performed at [facility], [address] [place], [place] [address]  Differential     Status: None   Collection Time: 09/24/17 10:23 AM  Result Value Ref Range   Neutrophils Relative % 54 %   Neutro Abs 4.5 1.7 - 7.7 K/uL   Lymphocytes Relative 36 %   Lymphs Abs 3.0 0.7 - 4.0 K/uL   Monocytes Relative 9 %   Monocytes Absolute 0.8 0.1 - 1.0 K/uL   Eosinophils Relative 1 %   Eosinophils Absolute 0.1 0.0 - 0.7 K/uL   Basophils Relative 0 %   Basophils Absolute 0.0 0.0 - 0.1 K/uL    Comment: Performed at [facility], [address] [place], [place] [address]  Comprehensive metabolic panel     Status: Abnormal   Collection Time: 09/24/17 10:23 AM  Result Value Ref Range   Sodium 142 135 - 145 mmol/L   Potassium 3.9 3.5 - 5.1 mmol/L   Chloride 107 101 - 111 mmol/L   CO2 22 22 - 32 mmol/L   Glucose, Bld 119 (H) 65 - 99 mg/dL   BUN 12 6 - 20 mg/dL   Creatinine, Ser 0.69 0.44 - 1.00 mg/dL   Calcium 9.4 8.9 - 10.3 mg/dL   Total Protein 8.0 6.5 - 8.1 g/dL    Albumin 4.0 3.5 - 5.0 g/dL   AST 15 15 - 41 U/L   ALT 10 (L) 14 - 54 U/L   Alkaline Phosphatase 52 38 - 126 U/L   Total Bilirubin 0.2 (L) 0.3 - 1.2 mg/dL   GFR calc non Af Amer >60 >60 mL/min   GFR calc Af Amer >60 >60 mL/min    Comment: (NOTE) The eGFR has been calculated using the CKD EPI equation. This calculation has not been validated in all clinical situations. eGFR's persistently <60 mL/min signify possible Chronic Kidney Disease.    Anion gap 13 5 - 15    Comment: Performed at [facility], [address] [place], [place] [address]  Ammonia     Status: None   Collection Time: 09/24/17 10:23 AM  Result Value Ref Range   Ammonia 27 9 - 35 umol/L    Comment: Performed at [facility], [address] [place], [place] [address]  Urine rapid drug screen (hosp performed)     Status: Abnormal   Collection Time: 09/24/17 10:26 AM  Result Value Ref Range   Opiates NONE DETECTED NONE DETECTED   Cocaine NONE DETECTED NONE DETECTED   Benzodiazepines NONE DETECTED NONE DETECTED   Amphetamines NONE DETECTED NONE DETECTED   Tetrahydrocannabinol NONE DETECTED NONE DETECTED   Barbiturates POSITIVE (A) NONE DETECTED    Comment: (NOTE) DRUG SCREEN FOR MEDICAL PURPOSES ONLY.  IF CONFIRMATION IS NEEDED FOR ANY PURPOSE, NOTIFY LAB WITHIN 5 DAYS. LOWEST DETECTABLE LIMITS FOR URINE DRUG SCREEN Drug Class                     Cutoff (ng/mL) Amphetamine and  metabolites    1000 Barbiturate and metabolites    200 Benzodiazepine                 604 Tricyclics and metabolites     300 Opiates and metabolites        300 Cocaine and metabolites        300 THC                            50 Performed at [facility], [address], [place], [place] [address]   Urinalysis, Routine w reflex microscopic     Status: Abnormal   Collection Time: 09/24/17 10:26 AM  Result Value Ref Range   Color, Urine STRAW (A) YELLOW   APPearance CLEAR CLEAR   Specific Gravity, Urine 1.008 1.005 - 1.030   pH 6.0  5.0 - 8.0   Glucose, UA NEGATIVE NEGATIVE mg/dL   Hgb urine dipstick NEGATIVE NEGATIVE   Bilirubin Urine NEGATIVE NEGATIVE   Ketones, ur NEGATIVE NEGATIVE mg/dL   Protein, ur NEGATIVE NEGATIVE mg/dL   Nitrite NEGATIVE NEGATIVE   Leukocytes, UA NEGATIVE NEGATIVE    Comment: Performed at [facility], [address], [place], [place] [address]  Blood gas, venous     Status: Abnormal   Collection Time: 09/24/17 10:27 AM  Result Value Ref Range   pH, Ven 7.408 7.250 - 7.430   pCO2, Ven 39.5 (L) 44.0 - 60.0 mmHg   pO2, Ven 151.0 (H) 32.0 - 45.0 mmHg   Bicarbonate 24.7 20.0 - 28.0 mmol/L   Acid-Base Excess 0.3 0.0 - 2.0 mmol/L   O2 Saturation 98.9 %   Patient temperature 37.0    Collection site VEIN    Drawn by DRAWN BY RN    Sample type VENOUS   CBG monitoring, ED     Status: Abnormal   Collection Time: 09/24/17 10:27 AM  Result Value Ref Range   Glucose-Capillary 111 (H) 65 - 99 mg/dL  I-Stat Chem 8, ED     Status: Abnormal   Collection Time: 09/24/17 10:35 AM  Result Value Ref Range   Sodium 142 135 - 145 mmol/L   Potassium 4.5 3.5 - 5.1 mmol/L   Chloride 107 101 - 111 mmol/L   BUN 13 6 - 20 mg/dL   Creatinine, Ser 0.60 0.44 - 1.00 mg/dL   Glucose, Bld 115 (H) 65 - 99 mg/dL   Calcium, Ion 1.13 (L) 1.15 - 1.40 mmol/L   TCO2 27 22 - 32 mmol/L   Hemoglobin 12.2 12.0 - 15.0 g/dL   HCT 36.0 36.0 - 46.0 %  TSH     Status: None   Collection Time: 09/24/17  4:13 PM  Result Value Ref Range   TSH 2.294 0.350 - 4.500 uIU/mL    Comment: Performed by a 3rd Generation assay with a functional sensitivity of <=0.01 uIU/mL. Performed at [facility], [address], [place], [place] [address]   Hemoglobin A1c     Status: Abnormal   Collection Time: 09/25/17  6:11 AM  Result Value Ref Range   Hgb A1c MFr Bld 6.1 (H) 4.8 - 5.6 %    Comment: (NOTE) Pre diabetes:          5.7%-6.4% Diabetes:              >6.4% Glycemic control for   <7.0% adults with diabetes    Mean Plasma Glucose  128.37 mg/dL    Comment: Performed at  [facility], [place] [address] [place], [place] [address]  Lipid panel     Status: None   Collection Time: 09/25/17  6:16 AM  Result Value Ref Range   Cholesterol 197 0 - 200 mg/dL   Triglycerides 124 <150 mg/dL   HDL 90 >40 mg/dL   Total CHOL/HDL Ratio 2.2 RATIO   VLDL 25 0 - 40 mg/dL   LDL Cholesterol 82 0 - 99 mg/dL    Comment:        Total Cholesterol/HDL:CHD Risk Coronary Heart Disease Risk Table                     Men   Women  1/2 Average Risk   3.4   3.3  Average Risk       5.0   4.4  2 X Average Risk   9.6   7.1  3 X Average Risk  23.4   11.0        Use the calculated Patient Ratio above and the CHD Risk Table to determine the patient's CHD Risk.        ATP III CLASSIFICATION (LDL):  <100     mg/dL   Optimal  100-129  mg/dL   Near or Above                    Optimal  130-159  mg/dL   Borderline  160-189  mg/dL   High  >190     mg/dL   Very High Performed at [facility], [address], [place], [place] [address]   Vitamin B12     Status: None   Collection Time: 09/25/17  6:16 AM  Result Value Ref Range   Vitamin B-12 309 180 - 914 pg/mL    Comment: (NOTE) This assay is not validated for testing neonatal or myeloproliferative syndrome specimens for Vitamin B12 levels. Performed at [facility], [place] [address] [place], [place] [address]   RPR     Status: None   Collection Time: 09/25/17  2:07 PM  Result Value Ref Range   RPR Ser Ql Non Reactive Non Reactive    Comment: (NOTE) Performed At: [facility] [place], [place] [id number] Rush Farmer MD [id number] Performed at [facility], [address], [place], [place] [address]   Sedimentation rate     Status: Abnormal   Collection Time: 09/25/17  2:07 PM  Result Value Ref Range   Sed Rate 42 (H) 0 - 22 mm/hr    Comment: Performed at [facility], [address], [place], [place] [address]  CBC     Status: None   Collection Time:  09/25/17  2:07 PM  Result Value Ref Range   WBC 8.1 4.0 - 10.5 K/uL   RBC 4.30 3.87 - 5.11 MIL/uL   Hemoglobin 13.0 12.0 - 15.0 g/dL   HCT 39.0 36.0 - 46.0 %   MCV 90.7 78.0 - 100.0 fL   MCH 30.2 26.0 - 34.0 pg   MCHC 33.3 30.0 - 36.0 g/dL   RDW 15.3 11.5 - 15.5 %   Platelets 231 150 - 400 K/uL    Comment: Performed at [facility], [address] [place], [place] [address]  Folate     Status: None   Collection Time: 09/25/17  2:07 PM  Result Value Ref Range   Folate 19.9 >5.9 ng/mL    Comment: Performed at [facility] [address],  [place], [place] [address]  HIV antibody (routine testing) (NOT for [facility])     Status: None   Collection Time: 09/25/17  2:07 PM  Result Value Ref Range   HIV Screen 4th Generation wRfx Non Reactive Non Reactive    Comment: (NOTE) Performed At: [facility] [place], [place] [id number] Rush Farmer MD [id number] Performed at [facility], [address], [place], [place] [address]   Basic metabolic panel     Status: Abnormal   Collection Time: 09/25/17  2:07 PM  Result Value Ref Range   Sodium 136 135 - 145 mmol/L   Potassium 3.6 3.5 - 5.1 mmol/L    Comment: CRITICAL RESULT CALLED TO, READ BACK BY AND VERIFIED WITH: FOLLEY,B AT 1520 ON 2.19.2019 BY ISLEY,B    Chloride 99 (L) 101 - 111 mmol/L   CO2 19 (L) 22 - 32 mmol/L   Glucose, Bld 103 (H) 65 - 99 mg/dL   BUN 14 6 - 20 mg/dL   Creatinine, Ser 0.62 0.44 - 1.00 mg/dL   Calcium 9.6 8.9 - 10.3 mg/dL   GFR calc non Af Amer >60 >60 mL/min   GFR calc Af Amer >60 >60 mL/min    Comment: (NOTE) The eGFR has been calculated using the CKD EPI equation. This calculation has not been validated in all clinical situations. eGFR's persistently <60 mL/min signify possible Chronic Kidney Disease.    Anion gap 18 (H) 5 - 15    Comment: Performed at [facility], [address] [place], [place] [address]  Phenobarbital level     Status: None   Collection Time: 09/25/17   8:54 PM  Result Value Ref Range   Phenobarbital 18.8 15.0 - 30.0 ug/mL    Comment: Performed at [facility], [address] [place], [place] [address]  Basic metabolic panel     Status: None   Collection Time: 09/26/17  5:49 AM  Result Value Ref Range   Sodium 138 135 - 145 mmol/L   Potassium 3.6 3.5 - 5.1 mmol/L   Chloride 103 101 - 111 mmol/L   CO2 23 22 - 32 mmol/L   Glucose, Bld 98 65 - 99 mg/dL   BUN 17 6 - 20 mg/dL   Creatinine, Ser 0.68 0.44 - 1.00 mg/dL   Calcium 9.2 8.9 - 10.3 mg/dL   GFR calc non Af Amer >60 >60 mL/min   GFR calc Af Amer >60 >60 mL/min    Comment: (NOTE) The eGFR has been calculated using the CKD EPI equation. This calculation has not been validated in all clinical situations. eGFR's persistently <60 mL/min signify possible Chronic Kidney Disease.    Anion gap 12 5 - 15    Comment: Performed at [facility], [address], [place], [place] [address]    Studies/Results:   BRAIN MRI FINDINGS: INTRACRANIAL CONTENTS: No reduced diffusion to suggest acute ischemia, infection, hypercellular tumor or status epilepticus. Ex vacuo dilatation bilateral occipital horns associated with RIGHT greater than LEFT encephalomalacia. LEFT frontal lobe encephalomalacia better demonstrated on prior CT.  VASCULAR: Nondiagnostic evaluation.  SKULL AND UPPER CERVICAL SPINE: J-shaped sella. Susceptibility artifact LEFT calvarium corresponding to known craniotomy. Craniocervical junction maintained.  SINUSES/ORBITS: Nondiagnostic evaluation.  OTHER: None.  IMPRESSION: 1. Limited 3 sequence noncontrast MRI of the head: No acute intracranial process.     The brain MRI is reviewed in person. It s compared to the previous scan done March 2018 and essentially is unchanged. The current scan is limited because of  poor cooperation. However, again there is marked encephalomalacia involving the right occipital region resulting in ventricular dilatation. There is  also encephalomalacia small tear involving the left occipital region and the left frontal increased signal involving the FLAIR imaging.   EEG Findings:  There is a posterior dominant rhythm of 11 Hz reactive to eye opening and closure. There is intermittent theta frequency focal slowing in the left temporal lobe.  There are epileptiform discharges with maximal negativity at F7 and spread to T3.  No electrographic seizures are present.  Photic stimulation and hyperventilation are not performed. Sleep is not recorded.    Impression:  This is an abnormal EEG.  There is evidence of cerebral dysfunction in the left temporal lobe as well as a seizure tendency emanating from the left anterior to mid temporal lobe. This puts the patient at risk of focal seizures with or without secondary generalization        Graysen Depaula A. [name], M.D.  Diplomate, Tax adviser of Psychiatry and Neurology ( Neurology). 09/26/2017, 8:59 AM

## 2017-09-27 LAB — BASIC METABOLIC PANEL
ANION GAP: 11 (ref 5–15)
BUN: 24 mg/dL — ABNORMAL HIGH (ref 6–20)
CHLORIDE: 103 mmol/L (ref 101–111)
CO2: 25 mmol/L (ref 22–32)
CREATININE: 0.71 mg/dL (ref 0.44–1.00)
Calcium: 9.3 mg/dL (ref 8.9–10.3)
GFR calc non Af Amer: >60 mL/min (ref >60)
Glucose, Bld: 105 mg/dL — ABNORMAL HIGH (ref 65–99)
POTASSIUM: 3.6 mmol/L (ref 3.5–5.1)
SODIUM: 139 mmol/L (ref 135–145)

## 2017-09-27 MED ORDER — LEVETIRACETAM 100 MG/ML PO SOLN
250.0000 mg | Freq: Two times a day (BID) | ORAL | Status: DC
  Administered 2017-09-27 – 2017-09-28 (×2): 250 mg via ORAL
  Filled 2017-09-27 (×8): qty 2.5

## 2017-09-27 MED ORDER — VALPROIC ACID 250 MG PO CAPS
500.0000 mg | ORAL_CAPSULE | Freq: Three times a day (TID) | ORAL | Status: DC
  Administered 2017-09-27 – 2017-09-28 (×2): 500 mg via ORAL
  Filled 2017-09-27 (×11): qty 2

## 2017-09-27 NOTE — Progress Notes (Signed)
Physical Therapy Treatment Patient Details Name: [name] MRN: [id number] DOB: March 10, 1946 Today's Date: 09/27/2017    History of Present Illness [name] is a 72 y.o. female with medical history of hypertension not on medications, seizure disorder, stroke, GI bleed presenting with acute onset of dysarthria and confusion around 9:30 in the morning on 09/24/2017.  The patient has been in her usual state of health up until that point.  Presently, the patient is unable to provide any history secondary to her confusion.  All of his history is obtained from speaking with EDP, husband, and review of the medical record.  Around 9:30 AM on 09/24/2017, the patient had acute onset of numbness in her right upper extremity with associated weakness and slurred speech.  In addition, the patient was confused according to the patient's family.  Prior to EMS arrival, the patient somewhat improved.  However upon EMS arrival, the patient's dysarthria came back.  The patient was started on risperidone approximately 1 month prior to this admission, but has not been on any other new medications.  There is been no reports of fevers, chills, chest pain, shortness breath, cough, hemoptysis, nausea, vomiting, diarrhea.     PT Comments    Pt received lying in bed with husband at bedside and was agreeable to PT treatment. Pt mod I with bed mobility this date and demo'ing improvements in sitting balance and coordination with LE during exercises EOB this date. Only min verbal and tactile cues required for proper technique. Pt agreeable to amb with SPC this date. She demo'd improved steadiness with SPC, but continues to have significantly decreased step length and speed. She continues to be ataxic AEB difficulty sequencing longer steps (she continued to take wider steps and began to crouch down). Pt continues to be limited by fatigue as she became more unsteady with increased time of gait. Continue to recommend SNF upon d/c  to further improved strength, gait, balance to promote return to PLOF.    Follow Up Recommendations  SNF;Supervision/Assistance - 24 hour     Equipment Recommendations  Cane    Recommendations for Other Services       Precautions / Restrictions Precautions Precautions: Fall Restrictions Weight Bearing Restrictions: No    Mobility  Bed Mobility Overal bed mobility: Modified Independent             General bed mobility comments: supine > sit mod I, supervision for scooting to EOB  Transfers Overall transfer level: Needs assistance Equipment used: Straight cane Transfers: Sit to/from Stand;Stand Pivot Transfers Sit to Stand: Min guard;Supervision Stand pivot transfers: Min guard;Supervision       General transfer comment: pt's transfers steadily improving as she was able to perform STS and STP with SPC with min guard to supervision; min cues for proper technique  Ambulation/Gait Ambulation/Gait assistance: Min assist;Min guard Ambulation Distance (Feet): 60 Feet Assistive device: Straight cane Gait Pattern/deviations: Decreased step length - right;Decreased step length - left;Decreased stride length;Drifts right/left;Wide base of support;Ataxic   Gait velocity interpretation: Below normal speed for age/gender General Gait Details: Pt willing to amb with SPC this date which resulted in much improved gait and balance. Continued with significantly decreased speed and step length, with difficulty correcting with cues; cotninued no LUE armswing,   Stairs            Wheelchair Mobility    Modified Rankin (Stroke Patients Only)       Balance Overall balance assessment: Needs assistance Sitting-balance support: No upper  extremity supported;Feet supported Sitting balance-Leahy Scale: Good     Standing balance support: No upper extremity supported;During functional activity Standing balance-Leahy Scale: Fair Standing balance comment: fair/poor with SPC,  increased unsteadiness the longer she was on her feet                            Cognition Arousal/Alertness: Awake/alert Behavior During Therapy: WFL for tasks assessed/performed Overall Cognitive Status: Impaired/Different from baseline                                        Exercises General Exercises - Lower Extremity Long Arc Quad: Seated;AROM;Strengthening;Both;10 reps Hip Flexion/Marching: Seated;AAROM;Strengthening;Both;10 reps    General Comments        Pertinent Vitals/Pain Pain Assessment: No/denies pain    Home Living                      Prior Function            PT Goals (current goals can now be found in the care plan section) Acute Rehab PT Goals Patient Stated Goal: return home PT Goal Formulation: With patient/family Time For Goal Achievement: 10/09/17 Potential to Achieve Goals: Good    Frequency    7X/week      PT Plan      Co-evaluation              AM-PAC PT "6 Clicks" Daily Activity  Outcome Measure  Difficulty turning over in bed (including adjusting bedclothes, sheets and blankets)?: A Little Difficulty moving from lying on back to sitting on the side of the bed? : A Little Difficulty sitting down on and standing up from a chair with arms (e.g., wheelchair, bedside commode, etc,.)?: A Little Help needed moving to and from a bed to chair (including a wheelchair)?: A Lot Help needed walking in hospital room?: A Lot Help needed climbing 3-5 steps with a railing? : A Lot 6 Click Score: 15    End of Session Equipment Utilized During Treatment: Gait belt Activity Tolerance: Patient limited by fatigue Patient left: in chair;with call bell/phone within reach;with chair alarm set;with family/visitor present Nurse Communication: Mobility status PT Visit Diagnosis: Unsteadiness on feet (R26.81);Other abnormalities of gait and mobility (R26.89);Muscle weakness (generalized) (M62.81)     Time:  1131-1150 PT Time Calculation (min) (ACUTE ONLY): 19 min  Charges:  $Therapeutic Activity: 8-22 mins                    G Codes:         Geraldine Solar PT, DPT

## 2017-09-27 NOTE — Progress Notes (Signed)
Brandi A. [name], MD     [email or web address]          [name] is an 72 y.o. female.   ASSESSMENT/PLAN: 1. Multiple neurological symptoms that are worse including worsening dysarthria, visual changes and confusion/encephalopathy: The most likely etiology is from multiple nonconvulsive seizures -  resolved nonconvulsive status epilepticus. 2. Significant behavioral disruption which I believe is in part due to multiple psychotropic medications including Keppra: 3. Remote infarcts: The patient should be in a full dose aspirin.   RECOMMENDATION: We will switch the valproic acid to PO formulation at the same dose. The Keppra should be discontinued about a week from now.  ]She is to continue with the phenobarbital 100 mg at bedtime.     She seemed to responded well to IV valproic acid. No side effects are reported. No episodic confusion reported. She continues to improve.  GENERAL: This a very pleasant thin female in no acute distress. She is quite bothersome about the fact that she has difficulty speaking.  HEENT: Supple. Atraumatic normocephalic.   ABDOMEN: soft  EXTREMITIES: No edema   BACK: Normal.  SKIN: Normal by inspection.    MENTAL STATUS: She is sitting up try to eat breakfast. She is more responsive and her dysarthria has improved significantly.  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  MOTOR: She has a left upper extremity monoparesis. Deltoid graded as 4/5. Hand grip to 2/5 with significant increased tone/spasticity associated with contracture of the hand. The other extremities shows  normal tone, bulk and strength.  COORDINATION: Left finger to nose is normal, right finger to nose is normal, No rest tremor; no intention tremor; no postural tremor; no bradykinesia.  REFLEXES: Deep tendon reflexes are symmetrical and normal, but slightly diminished in the legs.   SENSATION: Normal to light touch.                         [[[[[[[[[[[MY PRIOR NOTE 2016 1. Recurrent episodes of dysphagia/speech impairment and confusion with negative imaging for ischemic events. Post-lesional phenomena seems most likely particularly epileptic events. 2. Baseline history of epilepsy apparently well-controlled although these recurrent events are suspicious for seizures. 3. Left frontal meningioma which is significant given the size and location potentially causing aphasia and seizures. 4. 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 There is also history of intracranial hemorrhage however.  RECOMMENDATION: Given her subtherapeutic phenobarbital levels, the dose of phenobarbital will be increased. We'll go ahead and recheck a level tonight. EEG. One-time dose of Decadron given the mild vasogenic edema associated with the meningioma. Out patient neurosurgical consultation for excision of the meningioma. Speech evaluation/ swallowing evaluation. 30 day event monitor for possible cardioembolic events causing the previous large vessel chronic infarcts.   The patient is 72 year old black female who presents with the acute onset of impaired speech and confusion. The patient had a similar event about 6 months ago had a very extensive workup at that time including MRI/MRA, carotid and echocardiography. The workup was unrevealing at that time. We did have suspicion that the events could've been an epileptic event and she was to  get her phenobarbital increase but it appears this was not done. She also was  passed in outpatient follow-up but this was not done. The patient at baseline seems to function fairly well with the baseline mild to moderate dysarthria per the niece. She is able to talk relatively fluently until last night when she developed the acute dysarthria/aphasia. Her symptoms lasted for several hours but has improved. However, the niece reports that she's not baseline. She did have a bedside swallowing test last night but apparently failed and she has been kept nothing by mouth. She does not report worsening focal neurological symptoms. 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 No loss of consciousness reported. No chest pain, shortness of breath, GI GU symptoms. The review of systems is otherwise negative.      [[[[[[[[[[[[[[[[[MY PRIOR NOTE 04-08-14 1. Acute altered mental status Of unclear etiology. However, given the previous large vessel cortical infarcts and meningioma, seizure is suspected.  2. Moderate size left frontal extra-axial mass consistent with a meningioma. This is associated with the moderate vasogenic edema.  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  4. 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 ( Follow with repeat Brain CTA 12 months)    Increase phenobarbital. I think we should increase the phenobarbital tone rather than adding what the so-called new or safer agents. She has been well controlled on phenobarbital for many years and without side effects.  Likely trial of low-dose steroids. Decadron or prednisone should be sufficient.   Outpatient neurosurgical consultation for the meningioma.   Given the high likelihood of cardioembolic strokes, A 30 day event monitor/Holter monitor to evaluate for atrial fibrillation is suggested.  ]]]]]]]]]]]]            Blood pressure 127/80, pulse 88, temperature 97.8 F  (36.6 C), temperature source Oral, resp. rate 16, height _0  (1.676 m), weight 110 lb 10.7 oz (50.2 kg), SpO2 98 %.  Past Medical History:  Diagnosis Date  . Aphasia   . GI bleed 12/01/2016  . Hypertension   . Seizures ([place])   . Stroke [facility]) 2009, 2015 9/27, 11/2014  . Thyroid disease     Past Surgical History:  Procedure Laterality Date  . ABDOMINAL HYSTERECTOMY    . COLONOSCOPY N/A 12/03/2016   Procedure: COLONOSCOPY;  Surgeon: Daneil Dolin, MD;  Location: [facility];  Service: Endoscopy;  Laterality: N/A;  . CRANIOTOMY Left 06/18/2015   Procedure: CRANIOTOMY TUMOR EXCISION w/Curve;  Surgeon: Consuella Lose, MD;  Location: [facility] NEURO ORS;  Service: Neurosurgery;  Laterality: Left;  stereotactic Left Craniotomy for resection of meningioma  . PEG TUBE PLACEMENT    . PEG TUBE REMOVAL      Family History  Problem Relation Age of Onset  . Coronary artery disease Unknown        Stents   . Stroke Father        Hemorrhagic   . Sudden death Father   . Ulcers Mother        Bleeding  . Colon cancer Sister     Social History:  reports that she has been smoking cigarettes.  She has a 75.00 pack-year smoking history. she has never used smokeless tobacco. She reports that she does not drink alcohol or use drugs.  Allergies:  Allergies  Allergen Reactions  . Codeine Nausea And Vomiting and Rash    Medications: Prior  to Admission medications   Medication Sig Start Date End Date Taking? Authorizing Provider  amitriptyline (ELAVIL) 10 MG tablet Take 20 mg by mouth at bedtime.   Yes [provider]  aspirin EC 81 MG tablet Take 81 mg by mouth every evening.   Yes [provider]  atorvastatin (LIPITOR) 40 MG tablet Take 40 mg by mouth every evening.    Yes [provider]  cholecalciferol (VITAMIN D) 400 units TABS tablet Take 400 Units by mouth daily.   Yes [provider]  levETIRAcetam (KEPPRA) 500 MG tablet Take 1 tablet (500 mg total) by  mouth 2 (two) times daily. 01/21/16  Yes Vann, Tomi Bamberger, DO  levothyroxine (SYNTHROID, LEVOTHROID) 25 MCG tablet Take 25 mcg by mouth daily before breakfast.   Yes [provider]  PARoxetine (PAXIL) 10 MG tablet Take 10 mg by mouth daily.   Yes [provider]  PHENobarbital (LUMINAL) 97.2 MG tablet Take 1 tablet (97.2 mg total) by mouth at bedtime. 11/13/14  Yes Dondiego, Delfino Lovett, MD  QUEtiapine (SEROQUEL) 300 MG tablet Take 1 tablet by mouth daily. 09/22/17  Yes [provider]  risperiDONE (RISPERDAL) 1 MG tablet Take 1 tablet by mouth daily. 09/22/17  Yes [provider]    Scheduled Meds: . amLODipine  5 mg Oral Daily  . aspirin  300 mg Rectal Daily   Or  . aspirin  325 mg Oral Daily  . atorvastatin  40 mg Oral QPM  . cholecalciferol  400 Units Oral Daily  . cloNIDine  0.1 mg Oral BID  . enoxaparin (LOVENOX) injection  40 mg Subcutaneous Q24H  . levETIRAcetam  250 mg Oral BID  . levothyroxine  25 mcg Oral QAC breakfast  . nicotine  21 mg Transdermal Daily  . PARoxetine  10 mg Oral Daily  . PHENobarbital  97.2 mg Oral QHS  . QUEtiapine  50 mg Oral QHS   Continuous Infusions: . sodium chloride 10 mL/hr at 09/26/17 0400  . valproate sodium Stopped (09/27/17 1319)   PRN Meds:.acetaminophen **OR** acetaminophen (TYLENOL) oral liquid 160 mg/5 mL **OR** acetaminophen, hydrALAZINE, senna-docusate     Results for orders placed or performed during the hospital encounter of 09/24/17 (from the past 48 hour(s))  Phenobarbital level     Status: None   Collection Time: 09/25/17  8:54 PM  Result Value Ref Range   Phenobarbital 18.8 15.0 - 30.0 ug/mL    Comment: Performed at [facility], [address] [place], [place] [address]  Basic metabolic panel     Status: None   Collection Time: 09/26/17  5:49 AM  Result Value Ref Range   Sodium 138 135 - 145 mmol/L   Potassium 3.6 3.5 - 5.1 mmol/L   Chloride 103 101 - 111 mmol/L   CO2 23 22 - 32 mmol/L    Glucose, Bld 98 65 - 99 mg/dL   BUN 17 6 - 20 mg/dL   Creatinine, Ser 0.68 0.44 - 1.00 mg/dL   Calcium 9.2 8.9 - 10.3 mg/dL   GFR calc non Af Amer >60 >60 mL/min   GFR calc Af Amer >60 >60 mL/min    Comment: (NOTE) The eGFR has been calculated using the CKD EPI equation. This calculation has not been validated in all clinical situations. eGFR's persistently <60 mL/min signify possible Chronic Kidney Disease.    Anion gap 12 5 - 15    Comment: Performed at [facility], [address], [place], [place] [address]  Basic metabolic  panel     Status: Abnormal   Collection Time: 09/27/17  6:55 AM  Result Value Ref Range   Sodium 139 135 - 145 mmol/L   Potassium 3.6 3.5 - 5.1 mmol/L   Chloride 103 101 - 111 mmol/L   CO2 25 22 - 32 mmol/L   Glucose, Bld 105 (H) 65 - 99 mg/dL   BUN 24 (H) 6 - 20 mg/dL   Creatinine, Ser 0.71 0.44 - 1.00 mg/dL   Calcium 9.3 8.9 - 10.3 mg/dL   GFR calc non Af Amer >60 >60 mL/min   GFR calc Af Amer >60 >60 mL/min    Comment: (NOTE) The eGFR has been calculated using the CKD EPI equation. This calculation has not been validated in all clinical situations. eGFR's persistently <60 mL/min signify possible Chronic Kidney Disease.    Anion gap 11 5 - 15    Comment: Performed at [facility], [address] [place], [place] [address]    Studies/Results:   BRAIN MRI FINDINGS: INTRACRANIAL CONTENTS: No reduced diffusion to suggest acute ischemia, infection, hypercellular tumor or status epilepticus. Ex vacuo dilatation bilateral occipital horns associated with RIGHT greater than LEFT encephalomalacia. LEFT frontal lobe encephalomalacia better demonstrated on prior CT.  VASCULAR: Nondiagnostic evaluation.  SKULL AND UPPER CERVICAL SPINE: J-shaped sella. Susceptibility artifact LEFT calvarium corresponding to known craniotomy. Craniocervical junction maintained.  SINUSES/ORBITS: Nondiagnostic evaluation.  OTHER: None.  IMPRESSION: 1. Limited 3  sequence noncontrast MRI of the head: No acute intracranial process.     The brain MRI is reviewed in person. It s compared to the previous scan done March 2018 and essentially is unchanged. The current scan is limited because of poor cooperation. However, again there is marked encephalomalacia involving the right occipital region resulting in ventricular dilatation. There is also encephalomalacia small tear involving the left occipital region and the left frontal increased signal involving the FLAIR imaging.   EEG Findings:  There is a posterior dominant rhythm of 11 Hz reactive to eye opening and closure. There is intermittent theta frequency focal slowing in the left temporal lobe.  There are epileptiform discharges with maximal negativity at F7 and spread to T3.  No electrographic seizures are present.  Photic stimulation and hyperventilation are not performed. Sleep is not recorded.    Impression:  This is an abnormal EEG.  There is evidence of cerebral dysfunction in the left temporal lobe as well as a seizure tendency emanating from the left anterior to mid temporal lobe. This puts the patient at risk of focal seizures with or without secondary generalization        Lyn Deemer A. Merlene [name], M.D.  Diplomate, Tax adviser of Psychiatry and Neurology ( Neurology). 09/27/2017, 7:28 PM

## 2017-09-27 NOTE — Plan of Care (Signed)
  Education: Knowledge of General Education information will improve 09/27/2017 0403 - Progressing by Cassandria Anger, RN   Health Behavior/Discharge Planning: Ability to manage health-related needs will improve 09/27/2017 0403 - Progressing by Cassandria Anger, RN   Clinical Measurements: Ability to maintain clinical measurements within normal limits will improve 09/27/2017 0403 - Progressing by Cassandria Anger, RN Respiratory complications will improve 09/27/2017 0403 - Progressing by Cassandria Anger, RN   Coping: Level of anxiety will decrease 09/27/2017 0403 - Progressing by Cassandria Anger, RN   Elimination: Will not experience complications related to bowel motility 09/27/2017 0403 - Progressing by Cassandria Anger, RN   Pain Managment: General experience of comfort will improve 09/27/2017 0403 - Progressing by Cassandria Anger, RN   Safety: Ability to remain free from injury will improve 09/27/2017 0403 - Progressing by Cassandria Anger, RN

## 2017-09-27 NOTE — Progress Notes (Signed)
Keppra reduced in diosage today from 500 mg. Po bid to 250 mg PO BID . Appreciate physical therappy note as well a s neuro consult  [name] ZHG:[id number] DOB: [birth year] DOA: 09/24/2017 PCP: Lucia Gaskins, MD   Physical Exam: Blood pressure 126/83, pulse 83, temperature 98.5 F (36.9 C), temperature source Oral, resp. rate 18, height 5\' 6"  (1.676 [name]), weight 50.2 kg (110 lb 10.7 oz), SpO2 98 %. alert O x3 Lungs- clear heart reg rhythm no s3 s4 . No H,T , R.   Investigations:  No results found for this or any previous visit (from the past 240 hour(s)).   Basic Metabolic Panel: Recent Labs    09/26/17 0549 09/27/17 0655  NA 138 139  K 3.6 3.6  CL 103 103  CO2 23 25  GLUCOSE 98 105*  BUN 17 24*  CREATININE 0.68 0.71  CALCIUM 9.2 9.3   Liver Function Tests: No results for input(s): AST, ALT, ALKPHOS, BILITOT, PROT, ALBUMIN in the last 72 hours.   CBC: Recent Labs    09/25/17 1407  WBC 8.1  HGB 13.0  HCT 39.0  MCV 90.7  PLT 231    No results found.    Medications:   Impression:  Active Problems:   Acute encephalopathy   Dysarthria   Seizure disorder, nonconvulsive, with status epilepticus ([place])     Plan:keppra reduced to 250 mg. PO BID.  Seroquel reduced to 50 mg PO HS., continue physical therapy await orders on valproc acid from Neuro .  Consultants: Neuro    Procedures   Antibiotics:         Time spent:30 min   LOS: 1 day   [name]   09/27/2017, 1:09 PM

## 2017-09-28 ENCOUNTER — Inpatient Hospital Stay
Admission: RE | Admit: 2017-09-28 | Discharge: 2017-10-04 | Disposition: A | Discharge status: Home / Self Care | Payer: Medicare HMO | Source: Ambulatory Visit | Attending: Internal Medicine | Admitting: Internal Medicine

## 2017-09-28 LAB — BASIC METABOLIC PANEL
ANION GAP: 9 (ref 5–15)
BUN: 25 mg/dL — AB (ref 6–20)
CALCIUM: 8.9 mg/dL (ref 8.9–10.3)
CO2: 23 mmol/L (ref 22–32)
Chloride: 107 mmol/L (ref 101–111)
Creatinine, Ser: 0.71 mg/dL (ref 0.44–1.00)
GFR calc Af Amer: >60 mL/min (ref >60)
Glucose, Bld: 106 mg/dL — ABNORMAL HIGH (ref 65–99)
POTASSIUM: 3.9 mmol/L (ref 3.5–5.1)
Sodium: 139 mmol/L (ref 135–145)

## 2017-09-28 MED ORDER — LEVETIRACETAM 500 MG PO TABS: 250.0000 mg | ORAL_TABLET | Freq: Two times a day (BID) | ORAL | 0 refills | Status: DC

## 2017-09-28 MED ORDER — AMLODIPINE BESYLATE 5 MG PO TABS: 5.0000 mg | ORAL_TABLET | Freq: Every day | ORAL | 2 refills | Status: DC

## 2017-09-28 MED ORDER — QUETIAPINE FUMARATE 50 MG PO TABS: 50.0000 mg | ORAL_TABLET | Freq: Every day | ORAL | 3 refills | Status: DC

## 2017-09-28 MED ORDER — CLONIDINE HCL 0.1 MG PO TABS: 0.1000 mg | ORAL_TABLET | Freq: Two times a day (BID) | ORAL | 11 refills | Status: DC

## 2017-09-28 MED ORDER — VALPROIC ACID 250 MG PO CAPS: 500.0000 mg | ORAL_CAPSULE | Freq: Three times a day (TID) | ORAL | 2 refills | Status: AC

## 2017-09-28 NOTE — Progress Notes (Signed)
Report called to Renae, RN at [facility] and all questions answered.  Patient's IVs removed by Margreta Journey, NT.  Patient to be transported to the [facility] via wheelchair.

## 2017-09-28 NOTE — Clinical Social Work Placement (Signed)
   CLINICAL SOCIAL WORK PLACEMENT  NOTE  Date:  09/28/2017  Patient Details  Name: [name] MRN: [id number] Date of Birth: Dec 16, 1945  Clinical Social Work is seeking post-discharge placement for this patient at the [place] level of care (*CSW will initial, date and re-position this form in  chart as items are completed):  Yes   Patient/family provided with [place] Work Department's list of facilities offering this level of care within the geographic area requested by the patient (or if unable, by the patient's family).  Yes   Patient/family informed of their freedom to choose among providers that offer the needed level of care, that participate in Medicare, Medicaid or managed care program needed by the patient, have an available bed and are willing to accept the patient.  Yes   Patient/family informed of [facility]'s ownership interest in [facility] and [facility], as well as of the fact that they are under no obligation to receive care at these facilities.  PASRR submitted to EDS on 09/25/17     PASRR number received on 09/25/17     Existing PASRR number confirmed on       FL2 transmitted to all facilities in geographic area requested by pt/family on 09/25/17     FL2 transmitted to all facilities within larger geographic area on       Patient informed that his/her managed care company has contracts with or will negotiate with certain facilities, including the following:        Yes   Patient/family informed of bed offers received.  Patient chooses bed at [facility]     Physician recommends and patient chooses bed at      Patient to be transferred to [facility] on 09/28/17.  Patient to be transferred to facility by [facility] Staff     Patient family notified on 09/28/17 of transfer.  Name of family member notified:  message left for husband      PHYSICIAN       Additional Comment:  Facility notified  and discharge clinicals sent. LCSW spoke with Bernadene Bell and advised that patient discharged today and requested that SNF admission date be changed 09/28/17.  Patient's Josem Kaufmann updated for SNF admission today and next review date on 09/30/17.  _______________________________________________ Ihor Gully, LCSW 09/28/2017, 3:21 PM

## 2017-09-28 NOTE — Care Management Important Message (Signed)
Important Message  Patient Details  Name: [name] MRN: [id number] Date of Birth: 1945/11/08   Medicare Important Message Given:  Yes    [facility] Fiero, Chauncey [place], RN 09/28/2017, 12:43 PM

## 2017-09-28 NOTE — Discharge Summary (Signed)
Physician Discharge Summary  [name] AJO:[id number] DOB: [birth year] DOA: 09/24/2017  PCP: Lucia Gaskins, MD  Admit date: 09/24/2017 Discharge date: 09/28/2017   Recommendations for Outpatient Follow-up:  Take valproic acid 500 mg. PO TID henceforth. Decrease Keppra to 250 MG. PO bid FOR 7 DAYS THEN DISCONTINUE. Take . NORVASC 5 MG. Po q dAY ,Take  cLONIDINE 0.! MG po bid Discharge Diagnoses:  Active Problems:   Acute encephalopathy   Dysarthria   Seizure disorder, nonconvulsive, with status epilepticus [facility])   Discharge Condition: good  Filed Weights   09/24/17 1542  Weight: 50.2 kg (110 lb 10.7 oz)    History of present illness:  Elderly lady with menmingioma , encephalomalacia , siezure disorder , HTN who presented with increrased dysarthria with  CT MRI showing no eveidence of new CVA, Seen in consultation by Neurology with change in medication status and discontimuation of haldol and reduction of Seroquel to 50 mg PO HS. Likewise her risperdal was discontinued. She improved her mental status and had her htn better controlled with addition of Norvasc 5 MG PO QD. And Clonidine 0.1. Mg. PO BID. She was sent home with [facility] helath Physical therapy and nursing to  Supervise new medical regimin.  Hospital Course:  See hPI   Procedures:     Consultations:  neuro  Discharge Instructions  Discharge Instructions    Discharge instructions   Complete by:  As directed    Discharge patient   Complete by:  As directed    Discharge disposition:  01-Home or Self Care   Discharge patient date:  09/28/2017     Allergies as of 09/28/2017      Reactions   Codeine Nausea And Vomiting, Rash      Medication List    STOP taking these medications   PARoxetine 10 MG tablet Commonly known as:  PAXIL   risperiDONE 1 MG tablet Commonly known as:  RISPERDAL     TAKE these medications   amitriptyline 10 MG tablet Commonly known as:  ELAVIL Take 20 mg by mouth at  bedtime.   amLODipine 5 MG tablet Commonly known as:  NORVASC Take 1 tablet (5 mg total) by mouth daily. Start taking on:  09/29/2017   aspirin EC 81 MG tablet Take 81 mg by mouth every evening.   atorvastatin 40 MG tablet Commonly known as:  LIPITOR Take 40 mg by mouth every evening.   cholecalciferol 400 units Tabs tablet Commonly known as:  VITAMIN D Take 400 Units by mouth daily.   cloNIDine 0.1 MG tablet Commonly known as:  CATAPRES Take 1 tablet (0.1 mg total) by mouth 2 (two) times daily.   levETIRAcetam 500 MG tablet Commonly known as:  KEPPRA Take 1 tablet (500 mg total) by mouth 2 (two) times daily for 7 days.   levothyroxine 25 MCG tablet Commonly known as:  SYNTHROID, LEVOTHROID Take 25 mcg by mouth daily before breakfast.   PHENobarbital 97.2 MG tablet Commonly known as:  LUMINAL Take 1 tablet (97.2 mg total) by mouth at bedtime.   QUEtiapine 50 MG tablet Commonly known as:  SEROQUEL Take 1 tablet (50 mg total) by mouth at bedtime. What changed:    medication strength  how much to take  when to take this   valproic acid 250 MG capsule Commonly known as:  DEPAKENE Take 2 capsules (500 mg total) by mouth 3 (three) times daily.      Allergies  Allergen Reactions  . Codeine Nausea  And Vomiting and Rash   Contact information for after-discharge care    [facility] .   Service:  Skilled Nursing Contact information: [address] [place] [phone]               The results of significant diagnostics from this hospitalization (including imaging, microbiology, ancillary and laboratory) are listed below for reference.    Significant Diagnostic Studies: Dg Chest 1 View  Result Date: 09/24/2017 CLINICAL DATA:  Altered mental status EXAM: CHEST 1 VIEW COMPARISON:  01/19/2016 FINDINGS: There is hyperinflation of the lungs compatible with COPD. Mild cardiomegaly. No confluent  opacities or effusions. No acute bony abnormality. IMPRESSION: COPD.  Mild cardiomegaly.  No active disease. Electronically Signed   By: Rolm Baptise M.D.   On: 09/24/2017 10:45   Mr Brain Wo Contrast  Result Date: 09/24/2017 CLINICAL DATA:  Altered level of consciousness, slurred speech and difficulty moving RIGHT arm, now resolved. Confusion and agitation. History of meningioma resection. EXAM: MRI HEAD WITHOUT CONTRAST TECHNIQUE: Sagittal T1, axial coronal diffusion weighted imaging. Patient discontinued examination by climbing out of scanner. COMPARISON:  CT HEAD September 24, 2017 and MRI of the head October 30, 2016 FINDINGS: INTRACRANIAL CONTENTS: No reduced diffusion to suggest acute ischemia, infection, hypercellular tumor or status epilepticus. Ex vacuo dilatation bilateral occipital horns associated with RIGHT greater than LEFT encephalomalacia. LEFT frontal lobe encephalomalacia better demonstrated on prior CT. VASCULAR: Nondiagnostic evaluation. SKULL AND UPPER CERVICAL SPINE: J-shaped sella. Susceptibility artifact LEFT calvarium corresponding to known craniotomy. Craniocervical junction maintained. SINUSES/ORBITS: Nondiagnostic evaluation. OTHER: None. IMPRESSION: 1. Limited 3 sequence noncontrast MRI of the head: No acute intracranial process. Electronically Signed   By: Elon Alas M.D.   On: 09/24/2017 19:23   Ct Head Code Stroke Wo Contrast  Result Date: 09/24/2017 CLINICAL DATA:  Code stroke. 72 year old female. Unexplained altered mental status. Prior meningioma resection. EXAM: CT HEAD WITHOUT CONTRAST TECHNIQUE: Contiguous axial images were obtained from the base of the skull through the vertex without intravenous contrast. COMPARISON:  Brain MRI 10/30/2016. Head CT without contrast 01/19/2016. FINDINGS: Brain: Chronic right greater than left parietal (and right posterior frontal lobe) encephalomalacia. Chronic left operculum and external deep white matter capsule encephalomalacia  is stable since 2017. Stable patchy cerebral white matter hypodensity elsewhere. Chronic ex vacuo enlargement of the posterolateral ventricles, especially the right occipital horn. Stable cerebral volume. No midline shift, acute ventriculomegaly, mass effect, evidence of mass lesion, intracranial hemorrhage or evidence of cortically based acute infarction. Vascular: Extensive Calcified atherosclerosis at the skull base. No suspicious intracranial vascular hyperdensity. Skull: Prior left lateral craniotomy. Stable visualized osseous structures. Sinuses/Orbits: Visualized paranasal sinuses and mastoids are stable and well pneumatized. Other: Rightward gaze deviation. Otherwise negative orbits soft tissues. Stable scalp soft tissues. ASPECTS [facility] Stroke Program Early CT Score) - Ganglionic level infarction (caudate, lentiform nuclei, internal capsule, insula, M1-M3 cortex): 7 - Supraganglionic infarction (M4-M6 cortex): 3 Total score (0-10 with 10 being normal): 10; chronic bilateral frontal lobe encephalomalacia. IMPRESSION: 1. Stable non contrast CT appearance of the brain since 2017. 2. ASPECTS is 10; chronic bilateral frontal lobe encephalomalacia. 3. Study discussed by telephone with Dr. Merrily Pew on 09/24/2017 at 10:45 . Electronically Signed   By: Genevie Ann M.D.   On: 09/24/2017 10:47    Microbiology: No results found for this or any previous visit (from the past 240 hour(s)).   Labs: Basic Metabolic Panel: Recent Labs  Lab 09/24/17  1023 09/24/17 1035 09/25/17 1407 09/26/17 0549 09/27/17 0655 09/28/17 0518  NA 142 142 136 138 139 139  K 3.9 4.5 3.6 3.6 3.6 3.9  CL 107 107 99* 103 103 107  CO2 22  --  19* 23 25 23   GLUCOSE 119* 115* 103* 98 105* 106*  BUN 12 13 14 17  24* 25*  CREATININE 0.69 0.60 0.62 0.68 0.71 0.71  CALCIUM 9.4  --  9.6 9.2 9.3 8.9   Liver Function Tests: Recent Labs  Lab 09/24/17 1023  AST 15  ALT 10*  ALKPHOS 52  BILITOT 0.2*  PROT 8.0  ALBUMIN 4.0   No  results for input(s): LIPASE, AMYLASE in the last 168 hours. Recent Labs  Lab 09/24/17 1023  AMMONIA 27   CBC: Recent Labs  Lab 09/24/17 1023 09/24/17 1035 09/25/17 1407  WBC 8.4  --  8.1  NEUTROABS 4.5  --   --   HGB 12.2 12.2 13.0  HCT 37.0 36.0 39.0  MCV 91.8  --  90.7  PLT 228  --  231   Cardiac Enzymes: No results for input(s): CKTOTAL, CKMB, CKMBINDEX, TROPONINI in the last 168 hours. BNP: BNP (last 3 results) No results for input(s): BNP in the last 8760 hours.  ProBNP (last 3 results) No results for input(s): PROBNP in the last 8760 hours.  CBG: Recent Labs  Lab 09/24/17 1027  GLUCAP 111*       Signed:  Julius Boniface M   Pager: [phone] 09/28/2017, 12:39 PM

## 2017-10-01 ENCOUNTER — Encounter: Payer: Self-pay | Admitting: Internal Medicine

## 2017-10-01 ENCOUNTER — Encounter (HOSPITAL_COMMUNITY)
Admission: RE | Admit: 2017-10-01 | Discharge: 2017-10-01 | Disposition: A | Discharge status: Home / Self Care | Payer: Medicare HMO | Source: Skilled Nursing Facility | Attending: Internal Medicine | Admitting: Internal Medicine

## 2017-10-01 ENCOUNTER — Non-Acute Institutional Stay (SKILLED_NURSING_FACILITY): Payer: Medicare HMO | Admitting: Internal Medicine

## 2017-10-01 DIAGNOSIS — G40901 Epilepsy, unspecified, not intractable, with status epilepticus: Secondary | ICD-10-CM

## 2017-10-01 DIAGNOSIS — G9340 Encephalopathy, unspecified: Secondary | ICD-10-CM | POA: Diagnosis not present

## 2017-10-01 DIAGNOSIS — N2889 Other specified disorders of kidney and ureter: Secondary | ICD-10-CM

## 2017-10-01 DIAGNOSIS — I151 Hypertension secondary to other renal disorders: Secondary | ICD-10-CM | POA: Diagnosis not present

## 2017-10-01 DIAGNOSIS — R471 Dysarthria and anarthria: Secondary | ICD-10-CM | POA: Diagnosis not present

## 2017-10-01 DIAGNOSIS — G459 Transient cerebral ischemic attack, unspecified: Secondary | ICD-10-CM | POA: Insufficient documentation

## 2017-10-01 LAB — CBC WITH DIFFERENTIAL/PLATELET
BASOS ABS: 0 10*3/uL (ref 0.0–0.1)
Basophils Relative: 0 %
Eosinophils Absolute: 0.1 10*3/uL (ref 0.0–0.7)
Eosinophils Relative: 3 %
HCT: 36.6 % (ref 36.0–46.0)
HEMOGLOBIN: 12 g/dL (ref 12.0–15.0)
Lymphocytes Relative: 50 %
Lymphs Abs: 2.1 10*3/uL (ref 0.7–4.0)
MCH: 30.2 pg (ref 26.0–34.0)
MCHC: 32.8 g/dL (ref 30.0–36.0)
MCV: 92.2 fL (ref 78.0–100.0)
Monocytes Absolute: 0.6 10*3/uL (ref 0.1–1.0)
Monocytes Relative: 14 %
NEUTROS PCT: 33 %
Neutro Abs: 1.4 10*3/uL — ABNORMAL LOW (ref 1.7–7.7)
PLATELETS: 227 10*3/uL (ref 150–400)
RBC: 3.97 MIL/uL (ref 3.87–5.11)
RDW: 15.4 % (ref 11.5–15.5)
WBC: 4.3 10*3/uL (ref 4.0–10.5)

## 2017-10-01 LAB — BASIC METABOLIC PANEL
ANION GAP: 9 (ref 5–15)
BUN: 15 mg/dL (ref 6–20)
CO2: 29 mmol/L (ref 22–32)
Calcium: 9.1 mg/dL (ref 8.9–10.3)
Chloride: 104 mmol/L (ref 101–111)
Creatinine, Ser: 0.72 mg/dL (ref 0.44–1.00)
Glucose, Bld: 99 mg/dL (ref 65–99)
POTASSIUM: 3.8 mmol/L (ref 3.5–5.1)
SODIUM: 142 mmol/L (ref 135–145)

## 2017-10-01 LAB — PHENOBARBITAL LEVEL: PHENOBARBITAL: 24.6 ug/mL (ref 15.0–30.0)

## 2017-10-01 LAB — VALPROIC ACID LEVEL: VALPROIC ACID LVL: 59 ug/mL (ref 50.0–100.0)

## 2017-10-01 NOTE — Progress Notes (Signed)
Provider:  Veleta Miners Location:   [place] Room Number: 130/P Place of Service:  SNF (31)  PCP: Lucia Gaskins, MD Patient Care Team: Lucia Gaskins, MD as PCP - General (Internal Medicine) Lucia Gaskins, MD (Internal Medicine)  Extended Emergency Contact Information Primary Emergency Contact: Demaris Callander [place] of [place] Phone: [phone] Relation: Spouse Secondary Emergency Contact: Slade,Barbara Address: [address]          [place] [address] John[place]te [place] of [place] Phone: [phone] Mobile Phone: [phone] Relation: Sister  Code Status: Full Code Goals of Care: Advanced Directive information Advanced Directives 10/01/2017  Does Patient Have a Medical Advance Directive? Yes  Type of Advance Directive (No Data)  Does patient want to make changes to medical advance directive? No - Patient declined  Copy of [place] in Chart? -  Would patient like information on creating a medical advance directive? No - Patient declined      Chief Complaint  Patient presents with  . New Admit To SNF    New Admission Visit    HPI: Patient is a 72 y.o. female seen today for admission to Facility after Hospitalization for Acute Encephalopathy. She stayed in hospital from 02/18-02/22  Patient has h/o Encephalomalacia, Seizure disorder, Hypertension, Hyperlipidemia, GI bleed ,Meningioma s/p Excision in 2016  She was admitted to hospital with acute symptoms of Dysarthria and confusion. Her MRI was negative for any New CVA. 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 Also possible due to Non Convulsive Seizures. Her Meds were stopped and she is now transitioned to Valproic acid . Also Continues on Phenobarbital. Patietn does not remember much from hospital. Says she was upset with  her family and really was not Confused.She is back to her baseline now. Did not have any complains. She Lives with her husband. Does not drive but is independent in her ADLS. Was walking without any assistance in home.   Past Medical History:  Diagnosis Date  . Aphasia   . GI bleed 12/01/2016  . Hypertension   . Seizures ([place])   . Stroke [facility]) 2009, 2015 9/27, 11/2014  . Thyroid disease    Past Surgical History:  Procedure Laterality Date  . ABDOMINAL HYSTERECTOMY    . COLONOSCOPY N/A 12/03/2016   Procedure: COLONOSCOPY;  Surgeon: Daneil Dolin, MD;  Location[facility];  Service: Endoscopy;  Laterality: N/A;  . CRANIOTOMY Left 06/18/2015   Procedure: CRANIOTOMY TUMOR EXCISION w/Curve;  Surgeon: Consuella Lose, MD;  Location: [facility] NEURO ORS;  Service: Neurosurgery;  Laterality: Left;  stereotactic Left Craniotomy for resection of meningioma  . PEG TUBE PLACEMENT    . PEG TUBE REMOVAL      reports that she has been smoking cigarettes.  She has a 75.00 pack-year smoking history. she has never used smokeless tobacco. She reports that she does not drink alcohol or use drugs. Social History   Socioeconomic History  . Marital status: Married    Spouse name: Not on file  . Number of children: Not on file  . Years of education: Not on file  . Highest education level: Not on file  Social Needs  . Financial resource strain: Not on file  . Food insecurity - worry: Not on file  . Food insecurity - inability: Not on file  . Transportation needs - medical: Not on file  . Transportation  needs - non-medical: Not on file  Occupational History  . Not on file  Tobacco Use  . Smoking status: Current Every Day Smoker    Packs/day: 1.50    Years: 50.00    Pack years: 75.00    Types: Cigarettes  . Smokeless tobacco: Never Used  Substance and Sexual Activity  . Alcohol use: No  . Drug use: No  . Sexual activity: Not on file  Other Topics Concern  . Not on file  Social History  Narrative  . Not on file    Functional Status Survey:    Family History  Problem Relation Age of Onset  . Coronary artery disease Unknown        Stents   . Stroke Father        Hemorrhagic   . Sudden death Father   . Ulcers Mother        Bleeding  . Colon cancer Sister     Health Maintenance  Topic Date Due  . INFLUENZA VACCINE  10/29/2017 (Originally 03/07/2017)  . MAMMOGRAM  10/29/2017 (Originally 02/09/1998)  . DEXA SCAN  10/29/2017 (Originally 02/09/2013)  . TETANUS/TDAP  10/29/2017 (Originally 02/10/1967)  . Hepatitis C Screening  10/29/2017 (Originally 1945/11/19)  . PNA vac Low Risk Adult (1 of 2 - PCV13) 10/29/2017 (Originally 02/09/2013)  . COLONOSCOPY  12/04/2026    Allergies  Allergen Reactions  . Codeine Nausea And Vomiting and Rash    Outpatient Encounter Medications as of 10/01/2017  Medication Sig  . amitriptyline (ELAVIL) 10 MG tablet Take 20 mg by mouth at bedtime.  Marland Kitchen amLODipine (NORVASC) 5 MG tablet Take 1 tablet (5 mg total) by mouth daily.  Marland Kitchen aspirin EC 81 MG tablet Take 81 mg by mouth every evening.  Marland Kitchen atorvastatin (LIPITOR) 40 MG tablet Take 40 mg by mouth every evening.   . cholecalciferol (VITAMIN D) 400 units TABS tablet Take 400 Units by mouth daily.  . cloNIDine (CATAPRES) 0.1 MG tablet Take 1 tablet (0.1 mg total) by mouth 2 (two) times daily.  Marland Kitchen levETIRAcetam (KEPPRA) 250 MG tablet Take 250 mg by mouth 2 (two) times daily.  Marland Kitchen levothyroxine (SYNTHROID, LEVOTHROID) 25 MCG tablet Take 25 mcg by mouth daily before breakfast.  . PHENobarbital (LUMINAL) 97.2 MG tablet Take 1 tablet (97.2 mg total) by mouth at bedtime.  Marland Kitchen QUEtiapine (SEROQUEL) 50 MG tablet Take 1 tablet (50 mg total) by mouth at bedtime.  . valproic acid (DEPAKENE) 250 MG capsule Take 2 capsules (500 mg total) by mouth 3 (three) times daily.  . [DISCONTINUED] levETIRAcetam (KEPPRA) 500 MG tablet Take 1 tablet (500 mg total) by mouth 2 (two) times daily for 7 days.   No facility-administered  encounter medications on file as of 10/01/2017.      Review of Systems  Review of Systems  Constitutional: Negative for activity change, appetite change, chills, diaphoresis, fatigue and fever.  HENT: Negative for mouth sores, postnasal drip, rhinorrhea, sinus pain and sore throat.   Respiratory: Negative for apnea, cough, chest tightness, shortness of breath and wheezing.   Cardiovascular: Negative for chest pain, palpitations and leg swelling.  Gastrointestinal: Negative for abdominal distention, abdominal pain, constipation, diarrhea, nausea and vomiting.  Genitourinary: Negative for dysuria and frequency.  Musculoskeletal: Negative for arthralgias, joint swelling and myalgias.  Skin: Negative for rash.  Neurological: Negative for dizziness, syncope, weakness, light-headedness and numbness.  Psychiatric/Behavioral: Negative for behavioral problems, confusion and sleep disturbance.    Vitals:   10/01/17 1213  BP: 130/79  Pulse: 84  Resp: 20  Temp: 98.1 F (36.7 C)  TempSrc: Oral   There is no height or weight on file to calculate BMI. Physical Exam  Constitutional: She is oriented to person, place, and time. She appears well-developed.  HENT:  Head: Normocephalic.  Mouth/Throat: Oropharynx is clear and moist.  Eyes: Pupils are equal, round, and reactive to light.  Neck: Neck supple.  Cardiovascular: Normal rate and normal heart sounds.  No murmur heard. Pulmonary/Chest: Effort normal and breath sounds normal. No respiratory distress. She has no wheezes. She has no rales.  Abdominal: Soft. Bowel sounds are normal. She exhibits no distension. There is no tenderness. There is no rebound.  Musculoskeletal: She exhibits no edema.  Lymphadenopathy:    She has no cervical adenopathy.  Neurological: She is alert and oriented to person, place, and time.  Has Good strength in all extremities except Left UE.  Skin: Skin is warm and dry.  Psychiatric: She has a normal mood and  affect. Her behavior is normal. Thought content normal.    Labs reviewed: Basic Metabolic Panel: Recent Labs    09/27/17 0655 09/28/17 0518 10/01/17 0300  NA 139 139 142  K 3.6 3.9 3.8  CL 103 107 104  CO2 25 23 29   GLUCOSE 105* 106* 99  BUN 24* 25* 15  CREATININE 0.71 0.71 0.72  CALCIUM 9.3 8.9 9.1   Liver Function Tests: Recent Labs    12/01/16 1217 09/24/17 1023  AST 13* 15  ALT 9* 10*  ALKPHOS 59 52  BILITOT 0.2* 0.2*  PROT 6.7 8.0  ALBUMIN 3.5 4.0   No results for input(s): LIPASE, AMYLASE in the last 8760 hours. Recent Labs    09/24/17 1023  AMMONIA 27   CBC: Recent Labs    01/09/17 1218 09/24/17 1023 09/24/17 1035 09/25/17 1407 10/01/17 0300  WBC 8.8 8.4  --  8.1 4.3  NEUTROABS 6,072 4.5  --   --  1.4*  HGB 10.5* 12.2 12.2 13.0 12.0  HCT 32.4* 37.0 36.0 39.0 36.6  MCV 90.8 91.8  --  90.7 92.2  PLT 325 228  --  231 227   Cardiac Enzymes: No results for input(s): CKTOTAL, CKMB, CKMBINDEX, TROPONINI in the last 8760 hours. BNP: Invalid input(s): POCBNP Lab Results  Component Value Date   HGBA1C 6.1 (H) 09/25/2017   Lab Results  Component Value Date   TSH 2.294 09/24/2017   Lab Results  Component Value Date   VITAMINB12 309 09/25/2017   Lab Results  Component Value Date   FOLATE 19.9 09/25/2017   Lab Results  Component Value Date   IRON 52 12/04/2016   TIBC 265 12/04/2016   FERRITIN 36 12/04/2016    Imaging and Procedures obtained prior to SNF admission: No results found.  Assessment/Plan  Acute encephalopathy Thought to be due to Medicines. And  Non Convulsive Seizures. Patient MRI  was negative for any Acute process. She is her baseline now. Her Seroquel dose as reduced and Risperdal is discontinued.  Seizure disorder, nonconvulsive, with status epilepticus  Neurology has changed her Keppra to Valproic acid. Keppra will be discontinued in 7 days. She continues on Phenobarbital.  Dysarthria Per Neurology patient has  had these episodes before. She is back to her baseline   Hypertension  Patient was also started on Norvasc and Clonidine this admission as her BP was elevated on admission.  Hypothyroid TSH Normal in hospital Same dose of Synthyroid  Hyperlipidemia On Lipitor. Follows with her PCP  Disposition Discharge home with her husband Continue PT and OT in Facility.      Family/ staff Communication:   Labs/tests ordered: Total time spent in this patient care encounter was 45_ minutes; greater than 50% of the visit spent counseling patient, reviewing records , Labs and coordinating care for problems addressed at this encounter.

## 2017-10-01 NOTE — Progress Notes (Signed)
This encounter was created in error - please disregard.

## 2017-10-04 ENCOUNTER — Encounter: Payer: Self-pay | Admitting: Internal Medicine

## 2017-10-04 ENCOUNTER — Non-Acute Institutional Stay (SKILLED_NURSING_FACILITY): Payer: Medicare HMO | Admitting: Internal Medicine

## 2017-10-04 DIAGNOSIS — G9340 Encephalopathy, unspecified: Secondary | ICD-10-CM

## 2017-10-04 DIAGNOSIS — I151 Hypertension secondary to other renal disorders: Secondary | ICD-10-CM | POA: Diagnosis not present

## 2017-10-04 DIAGNOSIS — E032 Hypothyroidism due to medicaments and other exogenous substances: Secondary | ICD-10-CM | POA: Diagnosis not present

## 2017-10-04 DIAGNOSIS — N2889 Other specified disorders of kidney and ureter: Secondary | ICD-10-CM

## 2017-10-04 DIAGNOSIS — R471 Dysarthria and anarthria: Secondary | ICD-10-CM | POA: Diagnosis not present

## 2017-10-04 NOTE — Progress Notes (Signed)
Location:   [facility] Room Number: 130/P Place of Service:  SNF (31)  Provider: Veleta Miners  PCP: Lucia Gaskins, MD Patient Care Team: Lucia Gaskins, MD as PCP - General (Internal Medicine) Lucia Gaskins, MD (Internal Medicine)  Extended Emergency Contact Information Primary Emergency Contact: Demaris Callander [place] of [place] Phone: [phone] Relation: Spouse Secondary Emergency Contact: Slade,Barbara Address: [place]           [address] [address] [place] Phone: [phone] Mobile Phone: [phone] Relation: Sister  Code Status: Full Code Goals of care:  Advanced Directive information Advanced Directives 10/04/2017  Does Patient Have a Medical Advance Directive? Yes  Type of Advance Directive (No Data)  Does patient want to make changes to medical advance directive? No - Patient declined  Copy of [place] in Chart? -  Would patient like information on creating a medical advance directive? No - Patient declined     Allergies  Allergen Reactions  . Codeine Nausea And Vomiting and Rash    Chief Complaint  Patient presents with  . Discharge Note    Discharge Visit    HPI:  72 y.o. female  Seen today for Discharge from the facility.  Patient was admitted to the facility after staying in hospital from from 02/18-02/22 for Acute Encephalopathy.   Patient has h/o Encephalomalacia, Seizure disorder, Hypertension, Hyperlipidemia, GI bleed ,Meningioma s/p Excision in 2016  She was admitted to hospital with acute symptoms of Dysarthria and confusion. Her MRI was negative for any New CVA. 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 Also possible due to Non Convulsive Seizures. Her Meds were stopped and she is now transitioned to Valproic acid . Also  Continues on Phenobarbital. Patient was upset with her family that she was send to facility instead of going home. Per her husband her mental status is near her baseline. She is walking now with the cane. Has no new complains today. She just wants to go home. Patient didnot have any signs of Dysarthria or Confusion   Past Medical History:  Diagnosis Date  . Aphasia   . GI bleed 12/01/2016  . Hypertension   . Seizures ([place])   . Stroke [facility]) 2009, 2015 9/27, 11/2014  . Thyroid disease     Past Surgical History:  Procedure Laterality Date  . ABDOMINAL HYSTERECTOMY    . COLONOSCOPY N/A 12/03/2016   Procedure: COLONOSCOPY;  Surgeon: Daneil Dolin, MD;  [facility];  Service: Endoscopy;  Laterality: N/A;  . CRANIOTOMY Left 06/18/2015   Procedure: CRANIOTOMY TUMOR EXCISION w/Curve;  Surgeon: Consuella Lose, MD;  Location: [facility]O ORS;  Service: Neurosurgery;  Laterality: Left;  stereotactic Left Craniotomy for resection of meningioma  . PEG TUBE PLACEMENT    . PEG TUBE REMOVAL        reports that she has been smoking cigarettes.  She has a 75.00 pack-year smoking history. she has never used smokeless tobacco. She reports that she does not drink alcohol or use drugs. Social History   Socioeconomic History  . Marital status: Married    Spouse name: Not on file  . Number of children: Not on file  . Years of education: Not on file  . Highest education level: Not on file  Social Needs  . Financial resource strain: Not on file  . Food insecurity - worry:  Not on file  . Food insecurity - inability: Not on file  . Transportation needs - medical: Not on file  . Transportation needs - non-medical: Not on file  Occupational History  . Not on file  Tobacco Use  . Smoking status: Current Every Day Smoker    Packs/day: 1.50    Years: 50.00    Pack years: 75.00    Types: Cigarettes  . Smokeless tobacco: Never Used  Substance and Sexual Activity  . Alcohol use: No  .  Drug use: No  . Sexual activity: Not on file  Other Topics Concern  . Not on file  Social History Narrative  . Not on file   Functional Status Survey:    Allergies  Allergen Reactions  . Codeine Nausea And Vomiting and Rash    Pertinent  Health Maintenance Due  Topic Date Due  . INFLUENZA VACCINE  10/29/2017 (Originally 03/07/2017)  . MAMMOGRAM  10/29/2017 (Originally 02/09/1998)  . DEXA SCAN  10/29/2017 (Originally 02/09/2013)  . PNA vac Low Risk Adult (1 of 2 - PCV13) 10/29/2017 (Originally 02/09/2013)  . COLONOSCOPY  12/04/2026    Medications: Outpatient Encounter Medications as of 10/04/2017  Medication Sig  . amitriptyline (ELAVIL) 10 MG tablet Take 20 mg by mouth at bedtime.  Marland Kitchen amLODipine (NORVASC) 5 MG tablet Take 1 tablet (5 mg total) by mouth daily.  Marland Kitchen aspirin EC 81 MG tablet Take 81 mg by mouth every evening.  Marland Kitchen atorvastatin (LIPITOR) 40 MG tablet Take 40 mg by mouth every evening.   . cholecalciferol (VITAMIN D) 400 units TABS tablet Take 400 Units by mouth daily.  . cloNIDine (CATAPRES) 0.1 MG tablet Take 1 tablet (0.1 mg total) by mouth 2 (two) times daily.  Marland Kitchen levETIRAcetam (KEPPRA) 250 MG tablet Take 250 mg by mouth 2 (two) times daily.  Marland Kitchen levothyroxine (SYNTHROID, LEVOTHROID) 25 MCG tablet Take 25 mcg by mouth daily before breakfast.  . PHENobarbital (LUMINAL) 97.2 MG tablet Take 1 tablet (97.2 mg total) by mouth at bedtime.  Marland Kitchen QUEtiapine (SEROQUEL) 50 MG tablet Take 1 tablet (50 mg total) by mouth at bedtime.  . valproic acid (DEPAKENE) 250 MG capsule Take 2 capsules (500 mg total) by mouth 3 (three) times daily.   No facility-administered encounter medications on file as of 10/04/2017.      Review of Systems    Vitals:   10/04/17 1121  BP: 120/82  Pulse: 84  Resp: (!) 24  Temp: 97.8 F (36.6 C)  TempSrc: Oral   There is no height or weight on file to calculate BMI. Physical Exam  Constitutional: She is oriented to person, place, and time. She appears  well-developed and well-nourished.  HENT:  Head: Normocephalic.  Mouth/Throat: Oropharynx is clear and moist.  Eyes: Pupils are equal, round, and reactive to light.  Neck: Neck supple.  Cardiovascular: Normal rate and normal heart sounds.  Pulmonary/Chest: Effort normal and breath sounds normal. No respiratory distress. She has no wheezes. She has no rales.  Abdominal: Soft. Bowel sounds are normal. She exhibits no distension. There is no tenderness. There is no rebound.  Musculoskeletal: She exhibits no edema.  Neurological: She is alert and oriented to person, place, and time.  Skin: Skin is warm and dry.  Psychiatric: She has a normal mood and affect. Her behavior is normal. Thought content normal.    Labs reviewed: Basic Metabolic Panel: Recent Labs    09/27/17 0655 09/28/17 0518 10/01/17 0300  NA 139 139 142  K  3.6 3.9 3.8  CL 103 107 104  CO2 25 23 29   GLUCOSE 105* 106* 99  BUN 24* 25* 15  CREATININE 0.71 0.71 0.72  CALCIUM 9.3 8.9 9.1   Liver Function Tests: Recent Labs    12/01/16 1217 09/24/17 1023  AST 13* 15  ALT 9* 10*  ALKPHOS 59 52  BILITOT 0.2* 0.2*  PROT 6.7 8.0  ALBUMIN 3.5 4.0   No results for input(s): LIPASE, AMYLASE in the last 8760 hours. Recent Labs    09/24/17 1023  AMMONIA 27   CBC: Recent Labs    01/09/17 1218 09/24/17 1023 09/24/17 1035 09/25/17 1407 10/01/17 0300  WBC 8.8 8.4  --  8.1 4.3  NEUTROABS 6,072 4.5  --   --  1.4*  HGB 10.5* 12.2 12.2 13.0 12.0  HCT 32.4* 37.0 36.0 39.0 36.6  MCV 90.8 91.8  --  90.7 92.2  PLT 325 228  --  231 227   Cardiac Enzymes: No results for input(s): CKTOTAL, CKMB, CKMBINDEX, TROPONINI in the last 8760 hours. BNP: Invalid input(s): POCBNP CBG: Recent Labs    09/24/17 1027  GLUCAP 111*    Procedures and Imaging Studies During Stay: Dg Chest 1 View  Result Date: 09/24/2017 CLINICAL DATA:  Altered mental status EXAM: CHEST 1 VIEW COMPARISON:  01/19/2016 FINDINGS: There is  hyperinflation of the lungs compatible with COPD. Mild cardiomegaly. No confluent opacities or effusions. No acute bony abnormality. IMPRESSION: COPD.  Mild cardiomegaly.  No active disease. Electronically Signed   By: Rolm Baptise M.D.   On: 09/24/2017 10:45   Mr Brain Wo Contrast  Result Date: 09/24/2017 CLINICAL DATA:  Altered level of consciousness, slurred speech and difficulty moving RIGHT arm, now resolved. Confusion and agitation. History of meningioma resection. EXAM: MRI HEAD WITHOUT CONTRAST TECHNIQUE: Sagittal T1, axial coronal diffusion weighted imaging. Patient discontinued examination by climbing out of scanner. COMPARISON:  CT HEAD September 24, 2017 and MRI of the head October 30, 2016 FINDINGS: INTRACRANIAL CONTENTS: No reduced diffusion to suggest acute ischemia, infection, hypercellular tumor or status epilepticus. Ex vacuo dilatation bilateral occipital horns associated with RIGHT greater than LEFT encephalomalacia. LEFT frontal lobe encephalomalacia better demonstrated on prior CT. VASCULAR: Nondiagnostic evaluation. SKULL AND UPPER CERVICAL SPINE: J-shaped sella. Susceptibility artifact LEFT calvarium corresponding to known craniotomy. Craniocervical junction maintained. SINUSES/ORBITS: Nondiagnostic evaluation. OTHER: None. IMPRESSION: 1. Limited 3 sequence noncontrast MRI of the head: No acute intracranial process. Electronically Signed   By: Elon Alas M.D.   On: 09/24/2017 19:23   Ct Head Code Stroke Wo Contrast  Result Date: 09/24/2017 CLINICAL DATA:  Code stroke. 72 year old female. Unexplained altered mental status. Prior meningioma resection. EXAM: CT HEAD WITHOUT CONTRAST TECHNIQUE: Contiguous axial images were obtained from the base of the skull through the vertex without intravenous contrast. COMPARISON:  Brain MRI 10/30/2016. Head CT without contrast 01/19/2016. FINDINGS: Brain: Chronic right greater than left parietal (and right posterior frontal lobe)  encephalomalacia. Chronic left operculum and external deep white matter capsule encephalomalacia is stable since 2017. Stable patchy cerebral white matter hypodensity elsewhere. Chronic ex vacuo enlargement of the posterolateral ventricles, especially the right occipital horn. Stable cerebral volume. No midline shift, acute ventriculomegaly, mass effect, evidence of mass lesion, intracranial hemorrhage or evidence of cortically based acute infarction. Vascular: Extensive Calcified atherosclerosis at the skull base. No suspicious intracranial vascular hyperdensity. Skull: Prior left lateral craniotomy. Stable visualized osseous structures. Sinuses/Orbits: Visualized paranasal sinuses and mastoids are stable and well pneumatized. Other: Rightward gaze deviation.  Otherwise negative orbits soft tissues. Stable scalp soft tissues. ASPECTS [facility] Stroke Program Early CT Score) - Ganglionic level infarction (caudate, lentiform nuclei, internal capsule, insula, M1-M3 cortex): 7 - Supraganglionic infarction (M4-M6 cortex): 3 Total score (0-10 with 10 being normal): 10; chronic bilateral frontal lobe encephalomalacia. IMPRESSION: 1. Stable non contrast CT appearance of the brain since 2017. 2. ASPECTS is 10; chronic bilateral frontal lobe encephalomalacia. 3. Study discussed by telephone with Dr. Merrily Pew on 09/24/2017 at 10:45 . Electronically Signed   By: Genevie Ann M.D.   On: 09/24/2017 10:47    Assessment/Plan:    Acute encephalopathy Thought to be due to Medicines. And  Non Convulsive Seizures. Patient MRI  was negative for any Acute process. She is her baseline now. Her Seroquel dose as reduced and Risperdal is discontinued. D/W the Husband who also think she is at her baseline.  Seizure disorder, nonconvulsive, with status epilepticus  Neurology has changed her Keppra to Valproic acid. Keppra will be discontinued in 7 days. She continues on Phenobarbital. Will Need follow up with Dr Lorriane Shire and  Neurology  Dysarthria Per Neurology patient has had these episodes before. She is back to her baseline   Hypertension  Patient was also started on Norvasc and Clonidine this admission as her BP was elevated on admission. Her BP in Facility has been controlled.  Hypothyroid TSH Normal in hospital Same dose of Synthyroid  Hyperlipidemia On Lipitor. Follows with her PCP Disposition Discharge home with her husband Her sister helps with her meds. Follow up with her PCP in 1 week.   Future labs/tests needed:    D/W the Husband in the room. Discharge Planning >30 min

## 2017-10-08 ENCOUNTER — Encounter (HOSPITAL_COMMUNITY)
Admission: RE | Admit: 2017-10-08 | Discharge: 2017-10-08 | Disposition: A | Discharge status: Home / Self Care | Payer: Medicare HMO | Source: Skilled Nursing Facility | Attending: Internal Medicine | Admitting: Internal Medicine

## 2017-10-08 DIAGNOSIS — G459 Transient cerebral ischemic attack, unspecified: Secondary | ICD-10-CM | POA: Insufficient documentation

## 2017-10-09 ENCOUNTER — Ambulatory Visit: Payer: Medicare HMO | Admitting: Nurse Practitioner

## 2017-10-15 ENCOUNTER — Ambulatory Visit (INDEPENDENT_AMBULATORY_CARE_PROVIDER_SITE_OTHER): Payer: Medicare HMO | Admitting: Nurse Practitioner

## 2017-10-15 ENCOUNTER — Encounter: Payer: Self-pay | Admitting: Nurse Practitioner

## 2017-10-15 VITALS — BP 144/89 | HR 93 | Temp 98.9°F | Ht 65.0 in | Wt 110.6 lb

## 2017-10-15 DIAGNOSIS — D62 Acute posthemorrhagic anemia: Secondary | ICD-10-CM | POA: Diagnosis not present

## 2017-10-15 DIAGNOSIS — K922 Gastrointestinal hemorrhage, unspecified: Secondary | ICD-10-CM

## 2017-10-15 NOTE — Patient Instructions (Signed)
1. Continue your current medications. 2. Follow-up in 1 year. 3. Call us if you see any further bleeding in your bowel movements. 4. Call us if you have any questions or concerns.

## 2017-10-15 NOTE — Progress Notes (Signed)
CC'D TO PCP °

## 2017-10-15 NOTE — Assessment & Plan Note (Signed)
History of anemia status post lower GI bleed.  Review of her recent hemoglobins find normal hemoglobin since middle of 2018.  Denies further GI bleeding.  Denies any overt weakness/fatigue.  At this point I will have her continue her current medications and follow-up in a year.  She is to call us if she notices any symptoms of anemia or recurrent bleeding.

## 2017-10-15 NOTE — Progress Notes (Signed)
Referring Provider: Lucia Gaskins, MD Primary Care Physician:  Lucia Gaskins, MD Primary GI:  Dr. Gala Romney  Chief Complaint  Patient presents with  . GI Bleeding    f/u, doing ok    HPI:   [name] is a 72 y.o. female who presents follow-up on lower GI bleed.  The patient was last seen in our office 04/11/2017 for the same.  Previous hospitalization earlier in the year for the same requiring 2 units of blood in the ICU.  Colonoscopy results performed and outlined as per below.  At her last visit she was accompanied by her sister and stated she was doing well, no further bleeding.  No overt GI symptoms.  Noted 4-6 pound weight loss in the previous 3 months.  Complained of dry skin.  Recommended Lubriderm to help with dry skin, request blood test from primary care provider which had recently been drawn, follow-up in 6 months.  Unfortunately, it appears the patient was hospitalized on 09/24/2017 for encephalopathy.  She presented to the emergency department with dysarthria and CT MRI showing no evidence of new CVA.  Mental status improved with titration of her medications.  EEG completed 09/25/2017 found awake and asleep EEG abnormal due to focal slowing of the left temporal region which indicated focal cerebral dysfunction suggestive of underlying structural or physiologic abnormality.  Cannot exclude a clinical diagnosis of epilepsy.  She was discharged to a nursing home from her hospitalization.  Colonoscopy completed 12/03/2016 which found inadequate preparation, abnormal perianal exam, pancolonic diverticulosis with much blood and clot in the colon. Discrete bleeding lesion was not found, likely diverticular bleed from the left colon. Small colon polyps not manipulated. Patient noted to be a very poor operative candidate. Recommended transfuses necessary, transfer to ICU for observation and if rebleeding then transferred IR for angiography. Avoid all antiplatelet therapy for procedure  were future. Refer to interventional radiology if symptoms persist. Consider elective colonoscopy at a later date if benefits outweigh the risks.  Most recent CBC in our system completed 10/01/2017 which found stable hemoglobin at 12.0.  Review of previous results show hemoglobin was last noted to be abnormal 01/09/2017.  Today she is accompanied by her sister. Today she states she's doing well overall. She's happy to be out of the nursing home. She denies abdominal pain, N/V, hematochezia, melena, unintentional weight loss, fever, chills. Denies chest pain, dyspnea, dizziness, lightheadedness, syncope, near syncope. Denies any other upper or lower GI symptoms.  Past Medical History:  Diagnosis Date  . Aphasia   . GI bleed 12/01/2016  . Hypertension   . Seizures ([place])   . Stroke [facility]) 2009, 2015 9/27, 11/2014  . Thyroid disease     Past Surgical History:  Procedure Laterality Date  . ABDOMINAL HYSTERECTOMY    . COLONOSCOPY N/A 12/03/2016   Procedure: COLONOSCOPY;  Surgeon: Daneil Dolin, MD;  [facility];  Service: Endoscopy;  Laterality: N/A;  . CRANIOTOMY Left 06/18/2015   Procedure: CRANIOTOMY TUMOR EXCISION w/Curve;  Surgeon: Consuella Lose, MD;  Location: [place] [facility];  Service: Neurosurgery;  Laterality: Left;  stereotactic Left Craniotomy for resection of meningioma  . PEG TUBE PLACEMENT    . PEG TUBE REMOVAL      Current Outpatient Medications  Medication Sig Dispense Refill  . amitriptyline (ELAVIL) 10 MG tablet Take 20 mg by mouth at bedtime.    Marland Kitchen amLODipine (NORVASC) 5 MG tablet Take 1 tablet (5 mg total) by mouth daily. 30 tablet  2  . aspirin EC 81 MG tablet Take 81 mg by mouth every evening.    Marland Kitchen atorvastatin (LIPITOR) 40 MG tablet Take 40 mg by mouth every evening.     . cholecalciferol (VITAMIN D) 400 units TABS tablet Take 400 Units by mouth daily.    . cloNIDine (CATAPRES) 0.1 MG tablet Take 1 tablet (0.1 mg total) by mouth 2 (two) times daily. 60 tablet  11  . levothyroxine (SYNTHROID, LEVOTHROID) 25 MCG tablet Take 25 mcg by mouth daily before breakfast.    . PHENobarbital (LUMINAL) 97.2 MG tablet Take 1 tablet (97.2 mg total) by mouth at bedtime. 30 tablet 4  . QUEtiapine (SEROQUEL) 50 MG tablet Take 1 tablet (50 mg total) by mouth at bedtime. 30 tablet 3  . valproic acid (DEPAKENE) 250 MG capsule Take 2 capsules (500 mg total) by mouth 3 (three) times daily. 90 capsule 2  . levETIRAcetam (KEPPRA) 250 MG tablet Take 250 mg by mouth 2 (two) times daily.     No current facility-administered medications for this visit.     Allergies as of 10/15/2017 - Review Complete 10/15/2017  Allergen Reaction Noted  . Codeine Nausea And Vomiting and Rash 05/03/2014    Family History  Problem Relation Age of Onset  . Coronary artery disease Unknown        Stents   . Stroke Father        Hemorrhagic   . Sudden death Father   . Ulcers Mother        Bleeding  . Colon cancer Sister     Social History   Socioeconomic History  . Marital status: Married    Spouse name: None  . Number of children: None  . Years of education: None  . Highest education level: None  Social Needs  . Financial resource strain: None  . Food insecurity - worry: None  . Food insecurity - inability: None  . Transportation needs - medical: None  . Transportation needs - non-medical: None  Occupational History  . None  Tobacco Use  . Smoking status: Current Every Day Smoker    Packs/day: 1.50    Years: 50.00    Pack years: 75.00    Types: Cigarettes  . Smokeless tobacco: Never Used  Substance and Sexual Activity  . Alcohol use: No  . Drug use: No  . Sexual activity: None  Other Topics Concern  . None  Social History Narrative  . None    Review of Systems: General: Negative for anorexia, weight loss, fever, chills, fatigue, weakness. ENT: Negative for hoarseness, difficulty swallowing. CV: Negative for chest pain, angina, palpitations, peripheral edema.    Respiratory: Negative for dyspnea at rest, cough, sputum, wheezing.  GI: See history of present illness. Endo: Negative for unusual weight change.  Heme: Negative for bruising or bleeding. Allergy: Negative for rash or hives.   Physical Exam: BP (!) 144/89   Pulse 93   Temp 98.9 F (37.2 C) (Oral)   Ht 5\' 5"  (1.651 m)   Wt 110 lb 9.6 oz (50.2 kg)   BMI 18.40 kg/m  General:   Alert and oriented. Pleasant and cooperative. Well-nourished and well-developed.  Eyes:  Without icterus, sclera clear and conjunctiva pink.  Ears:  Normal auditory acuity. Cardiovascular:  S1, S2 present without murmurs appreciated. Extremities without clubbing or edema. Respiratory:  Clear to auscultation bilaterally. No wheezes, rales, or rhonchi. No distress.  Gastrointestinal:  +BS, soft, non-tender and non-distended. No HSM noted. No guarding  or rebound. No masses appreciated.  Rectal:  Deferred  Musculoskalatal:  Symmetrical without gross deformities. Neurologic:  Alert and oriented x4;  grossly normal neurologically. Psych:  Alert and cooperative. Normal mood and affect. Heme/Lymph/Immune: No excessive bruising noted.    10/15/2017 11:00 AM   Disclaimer: This note was dictated with voice recognition software. Similar sounding words can inadvertently be transcribed and may not be corrected upon review.

## 2017-10-15 NOTE — Assessment & Plan Note (Signed)
Denies further bleeding.  Hemoglobin stable.  Denies worsening weakness/fatigue.  Just left nursing home after encephalopathy admission.  Denies overt GI symptoms.  Continue to monitor, continue current medications, follow-up in a year.  She was requested to call us if she notices any further bleeding.

## 2017-11-15 ENCOUNTER — Encounter (HOSPITAL_COMMUNITY): Payer: Self-pay | Admitting: Emergency Medicine

## 2017-11-15 ENCOUNTER — Emergency Department (HOSPITAL_COMMUNITY)
Admission: EM | Admit: 2017-11-15 | Discharge: 2017-11-15 | Disposition: A | Discharge status: Home / Self Care | Payer: Medicare HMO | Attending: Emergency Medicine | Admitting: Emergency Medicine

## 2017-11-15 ENCOUNTER — Other Ambulatory Visit: Payer: Self-pay

## 2017-11-15 ENCOUNTER — Emergency Department (HOSPITAL_COMMUNITY): Payer: Medicare HMO

## 2017-11-15 DIAGNOSIS — R4689 Other symptoms and signs involving appearance and behavior: Secondary | ICD-10-CM | POA: Insufficient documentation

## 2017-11-15 DIAGNOSIS — R410 Disorientation, unspecified: Secondary | ICD-10-CM | POA: Insufficient documentation

## 2017-11-15 DIAGNOSIS — Z79899 Other long term (current) drug therapy: Secondary | ICD-10-CM | POA: Diagnosis not present

## 2017-11-15 DIAGNOSIS — I10 Essential (primary) hypertension: Secondary | ICD-10-CM | POA: Insufficient documentation

## 2017-11-15 DIAGNOSIS — E039 Hypothyroidism, unspecified: Secondary | ICD-10-CM | POA: Insufficient documentation

## 2017-11-15 DIAGNOSIS — Z7982 Long term (current) use of aspirin: Secondary | ICD-10-CM | POA: Insufficient documentation

## 2017-11-15 DIAGNOSIS — R531 Weakness: Secondary | ICD-10-CM | POA: Diagnosis present

## 2017-11-15 DIAGNOSIS — R29810 Facial weakness: Secondary | ICD-10-CM | POA: Insufficient documentation

## 2017-11-15 LAB — COMPREHENSIVE METABOLIC PANEL
ALT: 10 U/L — ABNORMAL LOW (ref 14–54)
AST: 14 U/L — AB (ref 15–41)
Albumin: 3.7 g/dL (ref 3.5–5.0)
Alkaline Phosphatase: 43 U/L (ref 38–126)
Anion gap: 12 (ref 5–15)
BILIRUBIN TOTAL: 0.3 mg/dL (ref 0.3–1.2)
BUN: 9 mg/dL (ref 6–20)
CO2: 26 mmol/L (ref 22–32)
CREATININE: 0.63 mg/dL (ref 0.44–1.00)
Calcium: 9.9 mg/dL (ref 8.9–10.3)
Chloride: 100 mmol/L — ABNORMAL LOW (ref 101–111)
Glucose, Bld: 106 mg/dL — ABNORMAL HIGH (ref 65–99)
POTASSIUM: 5 mmol/L (ref 3.5–5.1)
Sodium: 138 mmol/L (ref 135–145)
TOTAL PROTEIN: 7.7 g/dL (ref 6.5–8.1)

## 2017-11-15 LAB — I-STAT CHEM 8, ED
BUN: 7 mg/dL (ref 6–20)
CREATININE: 0.7 mg/dL (ref 0.44–1.00)
Calcium, Ion: 1.19 mmol/L (ref 1.15–1.40)
Chloride: 98 mmol/L — ABNORMAL LOW (ref 101–111)
GLUCOSE: 100 mg/dL — AB (ref 65–99)
HCT: 38 % (ref 36.0–46.0)
HEMOGLOBIN: 12.9 g/dL (ref 12.0–15.0)
POTASSIUM: 4.8 mmol/L (ref 3.5–5.1)
Sodium: 135 mmol/L (ref 135–145)
TCO2: 27 mmol/L (ref 22–32)

## 2017-11-15 LAB — DIFFERENTIAL
BASOS ABS: 0 10*3/uL (ref 0.0–0.1)
BASOS PCT: 0 %
EOS ABS: 0 10*3/uL (ref 0.0–0.7)
Eosinophils Relative: 0 %
LYMPHS ABS: 2.1 10*3/uL (ref 0.7–4.0)
Lymphocytes Relative: 28 %
MONOS PCT: 9 %
Monocytes Absolute: 0.7 10*3/uL (ref 0.1–1.0)
Neutro Abs: 4.7 10*3/uL (ref 1.7–7.7)
Neutrophils Relative %: 63 %

## 2017-11-15 LAB — RAPID URINE DRUG SCREEN, HOSP PERFORMED
Amphetamines: NOT DETECTED
BARBITURATES: POSITIVE — AB
Benzodiazepines: NOT DETECTED
Cocaine: NOT DETECTED
Opiates: NOT DETECTED
Tetrahydrocannabinol: NOT DETECTED

## 2017-11-15 LAB — CBC
HEMATOCRIT: 35.8 % — AB (ref 36.0–46.0)
HEMOGLOBIN: 12 g/dL (ref 12.0–15.0)
MCH: 30.3 pg (ref 26.0–34.0)
MCHC: 33.5 g/dL (ref 30.0–36.0)
MCV: 90.4 fL (ref 78.0–100.0)
Platelets: 302 10*3/uL (ref 150–400)
RBC: 3.96 MIL/uL (ref 3.87–5.11)
RDW: 15.5 % (ref 11.5–15.5)
WBC: 7.5 10*3/uL (ref 4.0–10.5)

## 2017-11-15 LAB — URINALYSIS, ROUTINE W REFLEX MICROSCOPIC
BILIRUBIN URINE: NEGATIVE
Bacteria, UA: NONE SEEN
GLUCOSE, UA: NEGATIVE mg/dL
KETONES UR: NEGATIVE mg/dL
Leukocytes, UA: NEGATIVE
Nitrite: NEGATIVE
PH: 7 (ref 5.0–8.0)
Protein, ur: NEGATIVE mg/dL
Specific Gravity, Urine: 1.005 (ref 1.005–1.030)

## 2017-11-15 LAB — I-STAT TROPONIN, ED: TROPONIN I, POC: 0 ng/mL (ref 0.00–0.08)

## 2017-11-15 LAB — ETHANOL

## 2017-11-15 LAB — PROTIME-INR
INR: 1.01
Prothrombin Time: 13.2 seconds (ref 11.4–15.2)

## 2017-11-15 LAB — APTT: APTT: 28 s (ref 24–36)

## 2017-11-15 NOTE — ED Triage Notes (Signed)
Per ems husband worried about right hand weakness and more confused this am. Not sure when symptoms started.

## 2017-11-15 NOTE — ED Notes (Signed)
Patient back from  X-ray 

## 2017-11-15 NOTE — ED Provider Notes (Signed)
[facility] Provider Note   CSN: [id number] Arrival date & time: 11/15/17  1038     History   Chief Complaint Chief Complaint  Patient presents with  . Weakness    HPI [name] is a 72 y.o. female.  The history is provided by the EMS personnel and the spouse.  Weakness  Primary symptoms include focal weakness (chronically in left side).  Primary symptoms include no loss of sensation, no loss of balance, no speech change, no memory loss. This is a new problem. The problem has not changed since onset.There has been no fever. Pertinent negatives include no shortness of breath, no vomiting, no altered mental status and no confusion. There were no medications administered prior to arrival. Associated medical issues do not include trauma.    Past Medical History:  Diagnosis Date  . Aphasia   . GI bleed 12/01/2016  . Hypertension   . Seizures ([place])   . Stroke [facility]) 2009, 2015 9/27, 11/2014  . Thyroid disease     Patient Active Problem List   Diagnosis Date Noted  . Seizure disorder, nonconvulsive, with status epilepticus ([place]) 09/26/2017  . Dysarthria 09/24/2017  . GI bleed 12/01/2016  . Acute blood loss anemia 12/01/2016  . Lower GI bleed   . Hemi-neglect of right side   . Hypothyroidism 01/19/2016  . Depression 01/19/2016  . Speech disturbance 01/19/2016  . Hyponatremia 01/19/2016  . Acute encephalopathy 01/19/2016  . Meningioma ([place]) 11/12/2014  . HTN (hypertension) 11/10/2014  . Tobacco use 11/10/2014  . History of CVA (cerebrovascular accident) 05/03/2014    Past Surgical History:  Procedure Laterality Date  . ABDOMINAL HYSTERECTOMY    . COLONOSCOPY N/A 12/03/2016   Procedure: COLONOSCOPY;  Surgeon: Daneil Dolin, MD;  [facility];  Service: Endoscopy;  Laterality: N/A;  . CRANIOTOMY Left 06/18/2015   Procedure: CRANIOTOMY TUMOR EXCISION w/Curve;  Surgeon: Consuella Lose, MD;  Location: [place] NEURO ORS;  Service:  Neurosurgery;  Laterality: Left;  stereotactic Left Craniotomy for resection of meningioma  . PEG TUBE PLACEMENT    . PEG TUBE REMOVAL       OB History   None      Home Medications    Prior to Admission medications   Medication Sig Start Date End Date Taking? Authorizing Provider  amitriptyline (ELAVIL) 10 MG tablet Take 20 mg by mouth at bedtime.   Yes [provider]  amLODipine (NORVASC) 5 MG tablet Take 1 tablet (5 mg total) by mouth daily. 09/29/17  Yes Dondiego, Richard, MD  aspirin EC 81 MG tablet Take 81 mg by mouth every evening.   Yes [provider]  atorvastatin (LIPITOR) 40 MG tablet Take 40 mg by mouth every evening.    Yes [provider]  cholecalciferol (VITAMIN D) 400 units TABS tablet Take 400 Units by mouth daily.   Yes [provider]  cloNIDine (CATAPRES) 0.1 MG tablet Take 1 tablet (0.1 mg total) by mouth 2 (two) times daily. 09/28/17  Yes Dondiego, Delfino Lovett, MD  levothyroxine (SYNTHROID, LEVOTHROID) 25 MCG tablet Take 25 mcg by mouth daily before breakfast.   Yes [provider]  PHENobarbital (LUMINAL) 97.2 MG tablet Take 1 tablet (97.2 mg total) by mouth at bedtime. 11/13/14  Yes Lucia Gaskins, MD  QUEtiapine (SEROQUEL) 50 MG tablet Take 1 tablet (50 mg total) by mouth at bedtime. 09/28/17  Yes Dondiego, Richard, MD  valproic acid (DEPAKENE) 250 MG capsule Take 2 capsules (500 mg total)  by mouth 3 (three) times daily. 09/28/17  Yes Lucia Gaskins, MD    Family History Family History  Problem Relation Age of Onset  . Coronary artery disease Unknown        Stents   . Stroke Father        Hemorrhagic   . Sudden death Father   . Ulcers Mother        Bleeding  . Colon cancer Sister     Social History Social History   Tobacco Use  . Smoking status: Current Every Day Smoker    Packs/day: 1.50    Years: 50.00    Pack years: 75.00    Types: Cigarettes  . Smokeless tobacco: Never Used  Substance Use Topics    . Alcohol use: No  . Drug use: No     Allergies   Codeine   Review of Systems Review of Systems  Respiratory: Negative for shortness of breath.   Gastrointestinal: Negative for vomiting.  Neurological: Positive for focal weakness (chronically in left side) and weakness. Negative for speech change and loss of balance.  Psychiatric/Behavioral: Negative for confusion and memory loss.  All other systems reviewed and are negative.    Physical Exam Updated Vital Signs BP (!) 151/92   Pulse 72   Temp 98.5 F (36.9 C) (Oral)   Resp (!) 22   Wt 49.9 kg (110 lb)   SpO2 100%   BMI 18.30 kg/m   Physical Exam  Constitutional: She appears well-developed and well-nourished.  HENT:  Head: Normocephalic and atraumatic.  Eyes: Conjunctivae and EOM are normal.  Neck: Normal range of motion.  Cardiovascular: Normal rate and regular rhythm.  Pulmonary/Chest: No stridor. No respiratory distress.  Abdominal: Soft. She exhibits no distension.  Musculoskeletal: Normal range of motion.  Neurological: She is alert. A cranial nerve deficit (left facial droop) is present. She exhibits abnormal muscle tone (left contractures).  Skin: Skin is warm and dry.  Nursing note and vitals reviewed.    ED Treatments / Results  Labs (all labs ordered are listed, but only abnormal results are displayed) Labs Reviewed  RAPID URINE DRUG SCREEN, HOSP PERFORMED - Abnormal; Notable for the following components:      Result Value   Barbiturates POSITIVE (*)    All other components within normal limits  URINALYSIS, ROUTINE W REFLEX MICROSCOPIC - Abnormal; Notable for the following components:   Color, Urine STRAW (*)    Hgb urine dipstick SMALL (*)    Squamous Epithelial / LPF 0-5 (*)    All other components within normal limits  I-STAT CHEM 8, ED - Abnormal; Notable for the following components:   Chloride 98 (*)    Glucose, Bld 100 (*)    All other components within normal limits  ETHANOL   PROTIME-INR  APTT  CBC  DIFFERENTIAL  COMPREHENSIVE METABOLIC PANEL  I-STAT TROPONIN, ED    EKG EKG Interpretation  Date/Time:  Thursday November 15 2017 10:47:12 EDT Ventricular Rate:  75 PR Interval:    QRS Duration: 104 QT Interval:  390 QTC Calculation: 436 R Axis:   0 Text Interpretation:  Sinus rhythm Borderline ST elevation, anterior leads no obvious changes from feburary 2019 Confirmed by Merrily Pew [phone]) on 11/15/2017 12:08:32 PM   Radiology Ct Head Wo Contrast  Result Date: 11/15/2017 CLINICAL DATA:  73 year old female with increasing confusion, right hand weakness. History of prior posterosuperior right hemisphere hemorrhage in 2005. History of prior craniotomy and meningioma resection. EXAM: CT HEAD WITHOUT  CONTRAST TECHNIQUE: Contiguous axial images were obtained from the base of the skull through the vertex without intravenous contrast. COMPARISON:  Head CT 09/24/2017.  Brain MRI 10/30/2016 and earlier. FINDINGS: Brain: Chronic encephalomalacia at the left insula and operculum, bilateral posterosuperior frontal lobes, bilateral parietal lobes (greater on the right), and posterior body and splenium of the corpus callosum. Chronic ex vacuo ventricle enlargement, maximal at the right occipital horn. Stable gray-white matter differentiation throughout the brain. No midline shift, ventriculomegaly, mass effect, evidence of mass lesion, intracranial hemorrhage or evidence of cortically based acute infarction. Vascular: Calcified atherosclerosis at the skull base. No suspicious intracranial vascular hyperdensity. Skull: Chronic left lateral craniotomy. Stable visualized osseous structures. Sinuses/Orbits: Visualized paranasal sinuses are stable and well pneumatized. Mild inferior mastoid air cell opacification is chronic and not significantly changed. Other: No acute scalp soft tissue finding. Disconjugate gaze, otherwise negative orbits soft tissues. IMPRESSION: 1.  No acute  intracranial abnormality. 2. Continued stable noncontrast CT appearance of the brain. Chronic encephalomalacia in both hemispheres and the posterior corpus callosum. Electronically Signed   By: Genevie Ann M.D.   On: 11/15/2017 12:55    Procedures Procedures (including critical care time)  Medications Ordered in ED Medications - No data to display   Initial Impression / Assessment and Plan / ED Course  I have reviewed the triage vital signs and the nursing notes.  Pertinent labs & imaging results that were available during my care of the patient were reviewed by me and considered in my medical decision making (see chart for details).  Will workup for causes of encephalopathy pending husbands arrival.   Husband states she is acting similar to baseline as does sister. Will continue with labs, ct head and urine. Likely discharge if all normal.   Findings on workup here.  Suspicion for stroke or other emergent causes or encephalopathy. Discussed findings with patient and husband, questions answered, will dc to fu w/ pcp/neurologist.   Final Clinical Impressions(s) / ED Diagnoses   Final diagnoses:  Aggression    ED Discharge Orders    None       Patty Leitzke, Corene Cornea, MD 11/15/17 1517

## 2017-11-20 ENCOUNTER — Emergency Department (HOSPITAL_COMMUNITY): Payer: Medicare HMO

## 2017-11-20 ENCOUNTER — Encounter (HOSPITAL_COMMUNITY): Payer: Self-pay

## 2017-11-20 ENCOUNTER — Other Ambulatory Visit: Payer: Self-pay

## 2017-11-20 ENCOUNTER — Observation Stay (HOSPITAL_COMMUNITY)
Admission: EM | Admit: 2017-11-20 | Discharge: 2017-11-24 | Disposition: A | Discharge status: Home / Self Care | Payer: Medicare HMO | Attending: Family Medicine | Admitting: Family Medicine

## 2017-11-20 DIAGNOSIS — Z7982 Long term (current) use of aspirin: Secondary | ICD-10-CM | POA: Diagnosis not present

## 2017-11-20 DIAGNOSIS — E032 Hypothyroidism due to medicaments and other exogenous substances: Secondary | ICD-10-CM

## 2017-11-20 DIAGNOSIS — I10 Essential (primary) hypertension: Secondary | ICD-10-CM | POA: Insufficient documentation

## 2017-11-20 DIAGNOSIS — F17210 Nicotine dependence, cigarettes, uncomplicated: Secondary | ICD-10-CM | POA: Diagnosis not present

## 2017-11-20 DIAGNOSIS — D320 Benign neoplasm of cerebral meninges: Secondary | ICD-10-CM | POA: Diagnosis not present

## 2017-11-20 DIAGNOSIS — Z79899 Other long term (current) drug therapy: Secondary | ICD-10-CM | POA: Insufficient documentation

## 2017-11-20 DIAGNOSIS — G40909 Epilepsy, unspecified, not intractable, without status epilepticus: Secondary | ICD-10-CM | POA: Insufficient documentation

## 2017-11-20 DIAGNOSIS — G9340 Encephalopathy, unspecified: Principal | ICD-10-CM

## 2017-11-20 DIAGNOSIS — E039 Hypothyroidism, unspecified: Secondary | ICD-10-CM | POA: Diagnosis not present

## 2017-11-20 DIAGNOSIS — R4182 Altered mental status, unspecified: Secondary | ICD-10-CM | POA: Diagnosis present

## 2017-11-20 DIAGNOSIS — R2681 Unsteadiness on feet: Secondary | ICD-10-CM | POA: Insufficient documentation

## 2017-11-20 DIAGNOSIS — Z8673 Personal history of transient ischemic attack (TIA), and cerebral infarction without residual deficits: Secondary | ICD-10-CM | POA: Insufficient documentation

## 2017-11-20 DIAGNOSIS — I639 Cerebral infarction, unspecified: Secondary | ICD-10-CM | POA: Diagnosis present

## 2017-11-20 DIAGNOSIS — D329 Benign neoplasm of meninges, unspecified: Secondary | ICD-10-CM

## 2017-11-20 DIAGNOSIS — G40901 Epilepsy, unspecified, not intractable, with status epilepticus: Secondary | ICD-10-CM | POA: Diagnosis present

## 2017-11-20 DIAGNOSIS — F320 Major depressive disorder, single episode, mild: Secondary | ICD-10-CM

## 2017-11-20 DIAGNOSIS — R471 Dysarthria and anarthria: Secondary | ICD-10-CM

## 2017-11-20 DIAGNOSIS — R414 Neurologic neglect syndrome: Secondary | ICD-10-CM

## 2017-11-20 DIAGNOSIS — Z720 Tobacco use: Secondary | ICD-10-CM

## 2017-11-20 LAB — URINALYSIS, ROUTINE W REFLEX MICROSCOPIC
BACTERIA UA: NONE SEEN
Bilirubin Urine: NEGATIVE
Glucose, UA: NEGATIVE mg/dL
Ketones, ur: NEGATIVE mg/dL
Leukocytes, UA: NEGATIVE
Nitrite: NEGATIVE
PROTEIN: NEGATIVE mg/dL
Specific Gravity, Urine: 1.011 (ref 1.005–1.030)
pH: 8 (ref 5.0–8.0)

## 2017-11-20 LAB — COMPREHENSIVE METABOLIC PANEL
ALT: 6 U/L — ABNORMAL LOW (ref 14–54)
AST: 15 U/L (ref 15–41)
Albumin: 4 g/dL (ref 3.5–5.0)
Alkaline Phosphatase: 38 U/L (ref 38–126)
Anion gap: 14 (ref 5–15)
BUN: 11 mg/dL (ref 6–20)
CHLORIDE: 100 mmol/L — AB (ref 101–111)
CO2: 22 mmol/L (ref 22–32)
Calcium: 9.7 mg/dL (ref 8.9–10.3)
Creatinine, Ser: 0.76 mg/dL (ref 0.44–1.00)
Glucose, Bld: 107 mg/dL — ABNORMAL HIGH (ref 65–99)
POTASSIUM: 4.5 mmol/L (ref 3.5–5.1)
SODIUM: 136 mmol/L (ref 135–145)
Total Bilirubin: 0.4 mg/dL (ref 0.3–1.2)
Total Protein: 8.3 g/dL — ABNORMAL HIGH (ref 6.5–8.1)

## 2017-11-20 LAB — CBC WITH DIFFERENTIAL/PLATELET
Basophils Absolute: 0 10*3/uL (ref 0.0–0.1)
Basophils Relative: 0 %
Eosinophils Absolute: 0 10*3/uL (ref 0.0–0.7)
Eosinophils Relative: 0 %
HCT: 37.5 % (ref 36.0–46.0)
HEMOGLOBIN: 12.6 g/dL (ref 12.0–15.0)
LYMPHS ABS: 1.8 10*3/uL (ref 0.7–4.0)
LYMPHS PCT: 13 %
MCH: 30.9 pg (ref 26.0–34.0)
MCHC: 33.6 g/dL (ref 30.0–36.0)
MCV: 91.9 fL (ref 78.0–100.0)
MONOS PCT: 7 %
Monocytes Absolute: 0.9 10*3/uL (ref 0.1–1.0)
NEUTROS PCT: 80 %
Neutro Abs: 11.1 10*3/uL — ABNORMAL HIGH (ref 1.7–7.7)
Platelets: 234 10*3/uL (ref 150–400)
RBC: 4.08 MIL/uL (ref 3.87–5.11)
RDW: 16 % — ABNORMAL HIGH (ref 11.5–15.5)
WBC: 13.8 10*3/uL — AB (ref 4.0–10.5)

## 2017-11-20 LAB — TSH
TSH: 1.93 u[IU]/mL (ref 0.350–4.500)
TSH: 1.376 u[IU]/mL (ref 0.350–4.500)

## 2017-11-20 LAB — AMMONIA: AMMONIA: 23 umol/L (ref 9–35)

## 2017-11-20 LAB — TROPONIN I

## 2017-11-20 LAB — VALPROIC ACID LEVEL: VALPROIC ACID LVL: 101 ug/mL — AB (ref 50.0–100.0)

## 2017-11-20 LAB — PHENOBARBITAL LEVEL: Phenobarbital: 12.3 ug/mL — ABNORMAL LOW (ref 15.0–30.0)

## 2017-11-20 MED ORDER — ENOXAPARIN SODIUM 40 MG/0.4ML ~~LOC~~ SOLN
40.0000 mg | SUBCUTANEOUS | Status: DC
  Administered 2017-11-20 – 2017-11-23 (×4): 40 mg via SUBCUTANEOUS
  Filled 2017-11-20 (×4): qty 0.4

## 2017-11-20 MED ORDER — ACETAMINOPHEN 325 MG PO TABS
650.0000 mg | ORAL_TABLET | Freq: Four times a day (QID) | ORAL | Status: DC | PRN
  Administered 2017-11-24: 650 mg via ORAL
  Filled 2017-11-20: qty 2

## 2017-11-20 MED ORDER — ASPIRIN EC 81 MG PO TBEC
81.0000 mg | DELAYED_RELEASE_TABLET | Freq: Every evening | ORAL | Status: DC
  Administered 2017-11-21 (×2): 81 mg via ORAL
  Filled 2017-11-20 (×3): qty 1

## 2017-11-20 MED ORDER — CHOLECALCIFEROL 10 MCG (400 UNIT) PO TABS
400.0000 [IU] | ORAL_TABLET | Freq: Every day | ORAL | Status: DC
  Administered 2017-11-21 – 2017-11-24 (×4): 400 [IU] via ORAL
  Filled 2017-11-20 (×5): qty 1

## 2017-11-20 MED ORDER — LACTATED RINGERS IV SOLN
INTRAVENOUS | Status: AC
  Administered 2017-11-20 – 2017-11-21 (×2): via INTRAVENOUS

## 2017-11-20 MED ORDER — AMITRIPTYLINE HCL 10 MG PO TABS
20.0000 mg | ORAL_TABLET | Freq: Every day | ORAL | Status: DC
  Administered 2017-11-21 (×2): 20 mg via ORAL
  Filled 2017-11-20 (×3): qty 2

## 2017-11-20 MED ORDER — PHENOBARBITAL 97.2 MG PO TABS
97.2000 mg | ORAL_TABLET | Freq: Every day | ORAL | Status: DC
  Administered 2017-11-21 – 2017-11-23 (×4): 97.2 mg via ORAL
  Filled 2017-11-20 (×5): qty 1

## 2017-11-20 MED ORDER — AMLODIPINE BESYLATE 5 MG PO TABS
5.0000 mg | ORAL_TABLET | Freq: Every day | ORAL | Status: DC
  Administered 2017-11-21 – 2017-11-24 (×4): 5 mg via ORAL
  Filled 2017-11-20 (×5): qty 1

## 2017-11-20 MED ORDER — ONDANSETRON HCL 4 MG PO TABS: 4.0000 mg | ORAL_TABLET | Freq: Four times a day (QID) | ORAL | Status: DC | PRN

## 2017-11-20 MED ORDER — LEVOTHYROXINE SODIUM 25 MCG PO TABS
25.0000 ug | ORAL_TABLET | Freq: Every day | ORAL | Status: DC
  Administered 2017-11-21 – 2017-11-24 (×4): 25 ug via ORAL
  Filled 2017-11-20 (×6): qty 1

## 2017-11-20 MED ORDER — CLONIDINE HCL 0.1 MG PO TABS
0.1000 mg | ORAL_TABLET | Freq: Two times a day (BID) | ORAL | Status: DC
  Administered 2017-11-21 – 2017-11-22 (×4): 0.1 mg via ORAL
  Filled 2017-11-20 (×5): qty 1

## 2017-11-20 MED ORDER — ACETAMINOPHEN 650 MG RE SUPP: 650.0000 mg | Freq: Four times a day (QID) | RECTAL | Status: DC | PRN

## 2017-11-20 MED ORDER — VALPROIC ACID 250 MG PO CAPS
500.0000 mg | ORAL_CAPSULE | Freq: Three times a day (TID) | ORAL | Status: DC
  Administered 2017-11-21 – 2017-11-24 (×10): 500 mg via ORAL
  Filled 2017-11-20 (×18): qty 2

## 2017-11-20 MED ORDER — ATORVASTATIN CALCIUM 40 MG PO TABS
40.0000 mg | ORAL_TABLET | Freq: Every evening | ORAL | Status: DC
  Administered 2017-11-21 – 2017-11-23 (×4): 40 mg via ORAL
  Filled 2017-11-20 (×5): qty 1

## 2017-11-20 MED ORDER — ONDANSETRON HCL 4 MG/2ML IJ SOLN: 4.0000 mg | Freq: Four times a day (QID) | INTRAMUSCULAR | Status: DC | PRN

## 2017-11-20 MED ORDER — LORAZEPAM 2 MG/ML IJ SOLN
2.0000 mg | Freq: Four times a day (QID) | INTRAMUSCULAR | Status: DC | PRN
  Administered 2017-11-20 – 2017-11-21 (×2): 2 mg via INTRAVENOUS
  Filled 2017-11-20 (×2): qty 1

## 2017-11-20 NOTE — Progress Notes (Signed)
Pt arrived to 305 via stretcher from ED. Attempted to orient pt to room and equipment. Pt A/O x 1. PAS and skin swarm completed with Steffanie , Therapist, sports. Dry hard area noted to Lt heel. No other skin issues noted. No needs voiced at this time. Bed alarm set. Will implement fall mats.

## 2017-11-20 NOTE — ED Notes (Signed)
MD at bedside. Pt not making very much sense. Is garbled when she talks. Left side contracted from previous stroke

## 2017-11-20 NOTE — H&P (Signed)
History and Physical    [name]:[id number] DOB: 1946/04/27 DOA: 11/20/2017  PCP: Lucia Gaskins, MD   Patient coming from: Home  Chief Complaint: Altered mentation  HPI: [name] is a 72 y.o. female with medical history significant for hypertension, meningioma, encephalomalacia with prior CVA, and seizure disorder who was brought to the emergency department by her husband on account of vague symptoms of confusion associated with some combativeness as well as weakness particularly to her right upper extremity.  He actually brought her to the ED on 4/11 at which point she was sent home as her workup was negative.  Unfortunately, symptoms have recurred despite taking her medications as prescribed at home.  Patient cannot give any significant history due to her condition. No fever or chills noted at home.  No recent infections.   ED Course: Vital signs stable with initial hypertension which has improved. Labs with leukocytosis of 13,800. CT head with old infarcts noted and nothing acute. 2V CXR with COPD and no other abnormalities.  Review of Systems: Cannot be obtained.  Past Medical History:  Diagnosis Date  . Aphasia   . GI bleed 12/01/2016  . Hypertension   . Seizures ([place])   . Stroke [facility]) 2009, 2015 9/27, 11/2014  . Thyroid disease     Past Surgical History:  Procedure Laterality Date  . ABDOMINAL HYSTERECTOMY    . COLONOSCOPY N/A 12/03/2016   Procedure: COLONOSCOPY;  Surgeon: Daneil Dolin, MD;  [facility];  Service: Endoscopy;  Laterality: N/A;  . CRANIOTOMY Left 06/18/2015   Procedure: CRANIOTOMY TUMOR EXCISION w/Curve;  Surgeon: Consuella Lose, MD;  Location: [place] NEURO ORS;  Service: Neurosurgery;  Laterality: Left;  stereotactic Left Craniotomy for resection of meningioma  . PEG TUBE PLACEMENT    . PEG TUBE REMOVAL       reports that she has been smoking cigarettes.  She has a 75.00 pack-year smoking history. She has never used  smokeless tobacco. She reports that she does not drink alcohol or use drugs.  Allergies  Allergen Reactions  . Codeine Nausea And Vomiting and Rash    Family History  Problem Relation Age of Onset  . Coronary artery disease Unknown        Stents   . Stroke Father        Hemorrhagic   . Sudden death Father   . Ulcers Mother        Bleeding  . Colon cancer Sister     Prior to Admission medications   Medication Sig Start Date End Date Taking? Authorizing Provider  amitriptyline (ELAVIL) 10 MG tablet Take 20 mg by mouth at bedtime.   Yes [provider]  amLODipine (NORVASC) 5 MG tablet Take 1 tablet (5 mg total) by mouth daily. 09/29/17  Yes Dondiego, Richard, MD  aspirin EC 81 MG tablet Take 81 mg by mouth every evening.   Yes [provider]  atorvastatin (LIPITOR) 40 MG tablet Take 40 mg by mouth every evening.    Yes [provider]  cholecalciferol (VITAMIN D) 400 units TABS tablet Take 400 Units by mouth daily.   Yes [provider]  cloNIDine (CATAPRES) 0.1 MG tablet Take 1 tablet (0.1 mg total) by mouth 2 (two) times daily. 09/28/17  Yes Dondiego, Richard, MD  levothyroxine (SYNTHROID, LEVOTHROID) 25 MCG tablet Take 25 mcg by mouth daily before breakfast.   Yes [provider]  PHENobarbital (LUMINAL) 97.2 MG tablet Take 1 tablet (97.2 mg  total) by mouth at bedtime. 11/13/14  Yes Lucia Gaskins, MD  QUEtiapine (SEROQUEL) 50 MG tablet Take 1 tablet (50 mg total) by mouth at bedtime. 09/28/17  Yes Dondiego, Richard, MD  valproic acid (DEPAKENE) 250 MG capsule Take 2 capsules (500 mg total) by mouth 3 (three) times daily. 09/28/17  Yes Lucia Gaskins, MD    Physical Exam: Vitals:   11/20/17 1718 11/20/17 1800 11/20/17 1830 11/20/17 1900  BP: (!) 154/86 (!) 158/90 (!) 160/103 (!) 151/84  Pulse: 91 85 92 90  Resp: (!) 145 13 (!) 23 (!) 23  Temp:      TempSrc:      SpO2: 100% 100% 100% 100%  Weight:        Constitutional:  NAD Vitals:   11/20/17 1718 11/20/17 1800 11/20/17 1830 11/20/17 1900  BP: (!) 154/86 (!) 158/90 (!) 160/103 (!) 151/84  Pulse: 91 85 92 90  Resp: (!) 145 13 (!) 23 (!) 23  Temp:      TempSrc:      SpO2: 100% 100% 100% 100%  Weight:       Eyes: lids and conjunctivae normal ENMT: Mucous membranes are moist.  Neck: normal, supple Respiratory: clear to auscultation bilaterally. Normal respiratory effort. No accessory muscle use.  Cardiovascular: Regular rate and rhythm, no murmurs. No extremity edema. Abdomen: no tenderness, no distention. Bowel sounds positive.  Musculoskeletal:  No joint deformity upper and lower extremities.  Will not cooperate with neurological exam. Skin: no rashes, lesions, ulcers.   Labs on Admission: I have personally reviewed following labs and imaging studies  CBC: Recent Labs  Lab 11/15/17 1449 11/15/17 1455 11/20/17 1610  WBC 7.5  --  13.8*  NEUTROABS 4.7  --  11.1*  HGB 12.0 12.9 12.6  HCT 35.8* 38.0 37.5  MCV 90.4  --  91.9  PLT 302  --  947   Basic Metabolic Panel: Recent Labs  Lab 11/15/17 1449 11/15/17 1455 11/20/17 1610  NA 138 135 136  K 5.0 4.8 4.5  CL 100* 98* 100*  CO2 26  --  22  GLUCOSE 106* 100* 107*  BUN 9 7 11   CREATININE 0.63 0.70 0.76  CALCIUM 9.9  --  9.7   GFR: Estimated Creatinine Clearance: 52.3 mL/min (by C-G formula based on SCr of 0.76 mg/dL). Liver Function Tests: Recent Labs  Lab 11/15/17 1449 11/20/17 1610  AST 14* 15  ALT 10* 6*  ALKPHOS 43 38  BILITOT 0.3 0.4  PROT 7.7 8.3*  ALBUMIN 3.7 4.0   No results for input(s): LIPASE, AMYLASE in the last 168 hours. Recent Labs  Lab 11/20/17 1520  AMMONIA 23   Coagulation Profile: Recent Labs  Lab 11/15/17 1449  INR 1.01   Cardiac Enzymes: Recent Labs  Lab 11/20/17 1610  TROPONINI <0.03   BNP (last 3 results) No results for input(s): PROBNP in the last 8760 hours. HbA1C: No results for input(s): HGBA1C in the last 72 hours. CBG: No  results for input(s): GLUCAP in the last 168 hours. Lipid Profile: No results for input(s): CHOL, HDL, LDLCALC, TRIG, CHOLHDL, LDLDIRECT in the last 72 hours. Thyroid Function Tests: No results for input(s): TSH, T4TOTAL, FREET4, T3FREE, THYROIDAB in the last 72 hours. Anemia Panel: No results for input(s): VITAMINB12, FOLATE, FERRITIN, TIBC, IRON, RETICCTPCT in the last 72 hours. Urine analysis:    Component Value Date/Time   COLORURINE STRAW (A) 11/20/2017 1810   APPEARANCEUR CLEAR 11/20/2017 1810   LABSPEC 1.011 11/20/2017 1810  PHURINE 8.0 11/20/2017 1810   GLUCOSEU NEGATIVE 11/20/2017 1810   HGBUR MODERATE (A) 11/20/2017 1810   BILIRUBINUR NEGATIVE 11/20/2017 1810   KETONESUR NEGATIVE 11/20/2017 1810   PROTEINUR NEGATIVE 11/20/2017 1810   UROBILINOGEN 0.2 11/10/2014 2212   NITRITE NEGATIVE 11/20/2017 1810   LEUKOCYTESUR NEGATIVE 11/20/2017 1810    Radiological Exams on Admission: Dg Chest 2 View  Result Date: 11/20/2017 CLINICAL DATA:  Altered mental status. EXAM: CHEST - 2 VIEW COMPARISON:  Chest x-ray dated September 24, 2017. FINDINGS: The heart size and mediastinal contours are within normal limits. Normal pulmonary vascularity. Atherosclerotic calcification of the aortic arch. The lungs remain hyperinflated. No focal consolidation, pleural effusion, or pneumothorax. No acute osseous abnormality. IMPRESSION: COPD.  No active cardiopulmonary disease. Electronically Signed   By: Titus Dubin M.D.   On: 11/20/2017 16:38   Ct Head Wo Contrast  Result Date: 11/20/2017 CLINICAL DATA:  Altered mental status, aggression, stroke, seizures EXAM: CT HEAD WITHOUT CONTRAST TECHNIQUE: Contiguous axial images were obtained from the base of the skull through the vertex without intravenous contrast. Sagittal and coronal MPR images reconstructed from axial data set. COMPARISON:  11/15/2017 FINDINGS: Brain: Scattered motion artifacts degrade exam despite repeat imaging. Dilatation of the  atrium and occipital horn of the RIGHT lateral ventricle. Remaining ventricular system normal appearance. No midline shift or mass effect. Old large posterior RIGHT parietal, small LEFT frontal, and small LEFT parietal vertex infarcts. Small vessel chronic ischemic changes of deep cerebral white matter. No intracranial hemorrhage, mass lesion, or evidence of acute infarction identified. No extra-axial fluid collections. Vascular: Atherosclerotic calcification of internal carotid and vertebrobasilar arteries at skull base. Skull: No gross acute osseous findings. Prior LEFT parietal craniotomy. Sinuses/Orbits: Grossly clear Other: N/A IMPRESSION: Atrophy with small vessel chronic ischemic changes of deep cerebral white matter. Old BILATERAL cortical infarcts. No definite acute intracranial abnormalities identified on exam limited by motion artifacts. Electronically Signed   By: Lavonia Dana M.D.   On: 11/20/2017 16:54    EKG: Independently reviewed. SR 90bpm.  Assessment/Plan Principal Problem:   Acute encephalopathy Active Problems:   History of CVA (cerebrovascular accident)   Meningioma ([facility])   Hypothyroidism   Seizure disorder, nonconvulsive, with status epilepticus ([place])    1. Acute encephalopathy with behavioral disturbance in the setting of prior CVA and seizure disorder.  This was thought to be related to nonconvulsive status epilepticus by Dr. [name] of neurology and 09/2017.  It was also thought that significant behavioral disruption was due to multiple psychotropic medications used for her seizure disorder.  Will evaluate further with brain MRI to rule out CVA.  Monitor on telemetry and check TSH level.  Check Depakote level.  Monitor closely with neurochecks.  May consider EEG if rest of workup is negative and/or neurology consultation at that point.  Patient may have some agitation related to her Seroquel which I will hold for now. 2. Hypothyroidism.  Continue Synthroid and check  TSH. 3. Hypertension.  Continue amlodipine and clonidine and monitor blood pressure closely. 4. Seizure disorder.  Continue phenobarbital, Depakote, and Keppra and check level.  Seizure precautions 5. History of prior CVA.  Continue statin and aspirin.  DVT prophylaxis: Lovenox Code Status: Full Family Communication: Husband at bedside Disposition Plan:Encephalopathy evaluation with MRI and possible further testing based on results Consults called:None Admission status: Obs, tele   Ezella Kell Darleen Crocker DO [facility] Pager [phone]  If 7PM-7AM, please contact night-coverage [email or web address] Password [facility]  11/20/2017, 7:13 PM

## 2017-11-20 NOTE — ED Notes (Signed)
Attempted an I&O. Pt was not tolerating it well. Applied Pure Elza Rafter and will get urine sample that way

## 2017-11-20 NOTE — ED Notes (Signed)
Patient given warm blanket and yellow non-skid socks at this time. Patient's sister at bedside.

## 2017-11-20 NOTE — ED Triage Notes (Signed)
Pt's husband called EMS because pt has been aggressive and has had altered mental status. She was A&O x3. With EMS stated she thought she was at her sisters  CBG 112 VSS  Keeps saying she wants her medication. Was here on the 11th for the same thing

## 2017-11-20 NOTE — ED Provider Notes (Signed)
Emergency Department Provider Note   I have reviewed the triage vital signs and the nursing notes.   HISTORY  Chief Complaint Altered Mental Status   HPI [name] is a 72 y.o. female with PMH of prior CVA with LUE deficits, HTN, meningioma, and thyroid disease presents to the emergency department for evaluation of acute onset altered mental status with generalized weakness and aggression.  Patient has been very confused for most of the morning.  The patient's husband states that she woke up this morning and seem like her normal self and he was assisting her with getting to the bathroom when she suddenly became much more confused.  She seemed more weak in a general way and required some assistance in the restroom.  She is continued to be somewhat combative and confused about where they were going that day.  He has been seen in the past with similar episodes.  He denies any recent medication changes.  No fevers or chills.  He has not noticed any unilateral weakness.  No obvious seizure activity.  Level 5 caveat: AMS. Most history provided by husband and EMS.    Past Medical History:  Diagnosis Date  . Aphasia   . GI bleed 12/01/2016  . Hypertension   . Seizures ([place])   . Stroke [facility]) 2009, 2015 9/27, 11/2014  . Thyroid disease     Patient Active Problem List   Diagnosis Date Noted  . CVA (cerebral vascular accident) ([place]) 11/20/2017  . Seizure disorder, nonconvulsive, with status epilepticus ([place]) 09/26/2017  . Dysarthria 09/24/2017  . GI bleed 12/01/2016  . Acute blood loss anemia 12/01/2016  . Lower GI bleed   . Hemi-neglect of right side   . Hypothyroidism 01/19/2016  . Depression 01/19/2016  . Speech disturbance 01/19/2016  . Hyponatremia 01/19/2016  . Acute encephalopathy 01/19/2016  . Meningioma ([place]) 11/12/2014  . HTN (hypertension) 11/10/2014  . Tobacco use 11/10/2014  . History of CVA (cerebrovascular accident) 05/03/2014    Past Surgical History:    Procedure Laterality Date  . ABDOMINAL HYSTERECTOMY    . COLONOSCOPY N/A 12/03/2016   Procedure: COLONOSCOPY;  Surgeon: Daneil Dolin, MD;  Location: [facility];  Service: Endoscopy;  Laterality: N/A;  . CRANIOTOMY Left 06/18/2015   Procedure: CRANIOTOMY TUMOR EXCISION w/Curve;  Surgeon: Consuella Lose, MD;  Location: [place] [facility] ORS;  Service: Neurosurgery;  Laterality: Left;  stereotactic Left Craniotomy for resection of meningioma  . PEG TUBE PLACEMENT    . PEG TUBE REMOVAL        Allergies Codeine  Family History  Problem Relation Age of Onset  . Coronary artery disease Unknown        Stents   . Stroke Father        Hemorrhagic   . Sudden death Father   . Ulcers Mother        Bleeding  . Colon cancer Sister     Social History Social History   Tobacco Use  . Smoking status: Current Every Day Smoker    Packs/day: 1.50    Years: 50.00    Pack years: 75.00    Types: Cigarettes  . Smokeless tobacco: Never Used  Substance Use Topics  . Alcohol use: No  . Drug use: No    Review of Systems  Level 5 caveat: AMS  ____________________________________________   PHYSICAL EXAM:  VITAL SIGNS: ED Triage Vitals  Enc Vitals Group     BP 11/20/17 1515 (!) 163/93  Pulse Rate 11/20/17 1515 95     Resp 11/20/17 1515 14     Temp 11/20/17 1515 99.3 F (37.4 C)     Temp Source 11/20/17 1515 Oral     SpO2 11/20/17 1515 97 %     Weight 11/20/17 1508 110 lb (49.9 kg)   Constitutional: Alert but difficult to engage in conversation. Patient saying repetitive phrases but in no acute distress.  Eyes: Conjunctivae are normal. Pupils are 2 mm and reactive.  Head: Atraumatic. Nose: No congestion/rhinnorhea. Mouth/Throat: Mucous membranes are slightly dry.  Neck: No stridor. Cardiovascular: Normal rate, regular rhythm. Good peripheral circulation. Grossly normal heart sounds.   Respiratory: Normal respiratory effort.  No retractions. Lungs CTAB. Gastrointestinal: Soft  and nontender. No distention.  Musculoskeletal: No lower extremity tenderness nor edema. No gross deformities of extremities. Neurologic: Speech is slightly slurred but baseline per husband. Weak, contracted LUE but otherwise normal neurological exam.  Skin:  Skin is warm, dry and intact. No rash noted.  ____________________________________________   LABS (all labs ordered are listed, but only abnormal results are displayed)  Labs Reviewed  COMPREHENSIVE METABOLIC PANEL - Abnormal; Notable for the following components:      Result Value   Chloride 100 (*)    Glucose, Bld 107 (*)    Total Protein 8.3 (*)    ALT 6 (*)    All other components within normal limits  CBC WITH DIFFERENTIAL/PLATELET - Abnormal; Notable for the following components:   WBC 13.8 (*)    RDW 16.0 (*)    Neutro Abs 11.1 (*)    All other components within normal limits  URINALYSIS, ROUTINE W REFLEX MICROSCOPIC - Abnormal; Notable for the following components:   Color, Urine STRAW (*)    Hgb urine dipstick MODERATE (*)    Squamous Epithelial / LPF 0-5 (*)    All other components within normal limits  BASIC METABOLIC PANEL - Abnormal; Notable for the following components:   Chloride 97 (*)    All other components within normal limits  CBC - Abnormal; Notable for the following components:   RBC 3.73 (*)    Hemoglobin 11.4 (*)    HCT 34.0 (*)    RDW 15.7 (*)    All other components within normal limits  VALPROIC ACID LEVEL - Abnormal; Notable for the following components:   Valproic Acid Lvl 101 (*)    All other components within normal limits  PHENOBARBITAL LEVEL - Abnormal; Notable for the following components:   Phenobarbital 12.3 (*)    All other components within normal limits  URINE CULTURE  AMMONIA  TROPONIN I  TSH  TSH  LEVETIRACETAM LEVEL  HEPATIC FUNCTION PANEL  AMMONIA  RPR  VITAMIN B12  SEDIMENTATION RATE  CBC  TSH  FOLATE  HIV ANTIBODY (ROUTINE TESTING)    ____________________________________________  EKG   EKG Interpretation  Date/Time:  Tuesday November 20 2017 16:05:49 EDT Ventricular Rate:  90 PR Interval:    QRS Duration: 80 QT Interval:  384 QTC Calculation: 470 R Axis:   -10 Text Interpretation:  Sinus rhythm No STEMI.  Similar ST changes when compared to prior.  Confirmed by Nanda Quinton [phone]) on 11/20/2017 4:18:49 PM       ____________________________________________  RADIOLOGY  Dg Chest 2 View  Result Date: 11/20/2017 CLINICAL DATA:  Altered mental status. EXAM: CHEST - 2 VIEW COMPARISON:  Chest x-ray dated September 24, 2017. FINDINGS: The heart size and mediastinal contours are within normal limits. Normal pulmonary  vascularity. Atherosclerotic calcification of the aortic arch. The lungs remain hyperinflated. No focal consolidation, pleural effusion, or pneumothorax. No acute osseous abnormality. IMPRESSION: COPD.  No active cardiopulmonary disease. Electronically Signed   By: Titus Dubin M.D.   On: 11/20/2017 16:38   Ct Head Wo Contrast  Result Date: 11/20/2017 CLINICAL DATA:  Altered mental status, aggression, stroke, seizures EXAM: CT HEAD WITHOUT CONTRAST TECHNIQUE: Contiguous axial images were obtained from the base of the skull through the vertex without intravenous contrast. Sagittal and coronal MPR images reconstructed from axial data set. COMPARISON:  11/15/2017 FINDINGS: Brain: Scattered motion artifacts degrade exam despite repeat imaging. Dilatation of the atrium and occipital horn of the RIGHT lateral ventricle. Remaining ventricular system normal appearance. No midline shift or mass effect. Old large posterior RIGHT parietal, small LEFT frontal, and small LEFT parietal vertex infarcts. Small vessel chronic ischemic changes of deep cerebral white matter. No intracranial hemorrhage, mass lesion, or evidence of acute infarction identified. No extra-axial fluid collections. Vascular: Atherosclerotic calcification  of internal carotid and vertebrobasilar arteries at skull base. Skull: No gross acute osseous findings. Prior LEFT parietal craniotomy. Sinuses/Orbits: Grossly clear Other: N/A IMPRESSION: Atrophy with small vessel chronic ischemic changes of deep cerebral white matter. Old BILATERAL cortical infarcts. No definite acute intracranial abnormalities identified on exam limited by motion artifacts. Electronically Signed   By: Lavonia Dana M.D.   On: 11/20/2017 16:54   Mr Brain Wo Contrast  Result Date: 11/21/2017 CLINICAL DATA:  Altered mental status. EXAM: MRI HEAD WITHOUT CONTRAST TECHNIQUE: Multiplanar, multiecho pulse sequences of the brain and surrounding structures were obtained without intravenous contrast. COMPARISON:  CT yesterday.  MRI 09/24/2017 FINDINGS: Brain: The patient would not tolerate complete scanning. Motion degraded diffusion imaging does not show any acute or subacute infarction. Chronic atrophy, encephalomalacia and gliosis in the parietal regions right more than left and in the left posterior frontal region. No sign of mass lesion, hemorrhage, obstructive hydrocephalus or extra-axial collection. Ex vacuo enlargement of the lateral ventricles right more than left. Vascular: Major vessels at the base of the brain show flow. Skull and upper cervical spine: Previous left frontoparietal craniotomy. Sinuses/Orbits: No sinus disease seen. Other: None detected. IMPRESSION: Abbreviated in motion degraded exam. No acute finding. Chronic insults as above. Electronically Signed   By: Nelson Chimes M.D.   On: 11/21/2017 09:31    ____________________________________________   PROCEDURES  Procedure(s) performed:   Procedures  None ____________________________________________   INITIAL IMPRESSION / ASSESSMENT AND PLAN / ED COURSE  Pertinent labs & imaging results that were available during my care of the patient were reviewed by me and considered in my medical decision making (see chart for  details).  Patient presents to the emergency department with acute onset altered mental status with some aggression.  She is not aggressive towards staff here but difficult to engage in meaningful conversation.  She is saying repetitive phrases.  No obvious seizure activity.  She is afebrile here with normal vital signs overall.  Husband does describe some urinary frequency this morning along with generalized weakness.  Plan for labs including UA and will obtain chest x-ray/CT imaging of the head for further evaluation.  Episodes in the past of been related to medication side effect.  05:00 PM Patient labs and CXR along with CT head reviewed with no acute findings. UA pending.   UA negative. 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   Discussed patient's case with Hospitalist to  request admission. Patient and family (if present) updated with plan. Care transferred to Hospitalist service.  I reviewed all nursing notes, vitals, pertinent old records, EKGs, labs, imaging (as available).  ____________________________________________  FINAL CLINICAL IMPRESSION(S) / ED DIAGNOSES  Final diagnoses:  Encephalopathy acute     MEDICATIONS GIVEN DURING THIS VISIT:  Medications  amitriptyline (ELAVIL) tablet 20 mg (20 mg Oral Given 11/21/17 0105)  amLODipine (NORVASC) tablet 5 mg (5 mg Oral Given 11/21/17 0956)  aspirin EC tablet 81 mg (81 mg Oral Given 11/21/17 0105)  atorvastatin (LIPITOR) tablet 40 mg (40 mg Oral Given 11/21/17 0105)  cholecalciferol (VITAMIN D) tablet 400 Units (400 Units Oral Given 11/21/17 0956)  cloNIDine (CATAPRES) tablet 0.1 mg (0.1 mg Oral Given 11/21/17 0956)  levothyroxine (SYNTHROID, LEVOTHROID) tablet 25 mcg (25 mcg Oral Given 11/21/17 0928)  PHENobarbital (LUMINAL) tablet 97.2 mg (97.2 mg Oral Given 11/21/17 0104)  valproic acid (DEPAKENE) 250 MG capsule 500 mg (500 mg Oral Given 11/21/17 1047)  enoxaparin (LOVENOX)  injection 40 mg (40 mg Subcutaneous Given 11/20/17 2232)  lactated ringers infusion ( Intravenous Stopped 11/21/17 1311)  acetaminophen (TYLENOL) tablet 650 mg (has no administration in time range)    Or  acetaminophen (TYLENOL) suppository 650 mg (has no administration in time range)  ondansetron (ZOFRAN) tablet 4 mg (has no administration in time range)    Or  ondansetron (ZOFRAN) injection 4 mg (has no administration in time range)  LORazepam (ATIVAN) injection 2 mg (has no administration in time range)    Note:  This document was prepared using Dragon voice recognition software and may include unintentional dictation errors.  Nanda Quinton, MD Emergency Medicine   Baldo Hufnagle, [facility], MD 11/21/17 1355

## 2017-11-21 ENCOUNTER — Observation Stay (HOSPITAL_COMMUNITY): Payer: Medicare HMO

## 2017-11-21 LAB — AMMONIA: AMMONIA: 26 umol/L (ref 9–35)

## 2017-11-21 LAB — HEPATIC FUNCTION PANEL
ALK PHOS: 41 U/L (ref 38–126)
ALT: 9 U/L — AB (ref 14–54)
AST: 16 U/L (ref 15–41)
Albumin: 3.7 g/dL (ref 3.5–5.0)
BILIRUBIN TOTAL: 0.8 mg/dL (ref 0.3–1.2)
Bilirubin, Direct: <0.1 mg/dL — ABNORMAL LOW (ref 0.1–0.5)
Total Protein: 7.7 g/dL (ref 6.5–8.1)

## 2017-11-21 LAB — BASIC METABOLIC PANEL
ANION GAP: 12 (ref 5–15)
BUN: 14 mg/dL (ref 6–20)
CHLORIDE: 97 mmol/L — AB (ref 101–111)
CO2: 26 mmol/L (ref 22–32)
Calcium: 9.4 mg/dL (ref 8.9–10.3)
Creatinine, Ser: 0.72 mg/dL (ref 0.44–1.00)
GFR calc Af Amer: >60 mL/min (ref >60)
GFR calc non Af Amer: >60 mL/min (ref >60)
GLUCOSE: 91 mg/dL (ref 65–99)
POTASSIUM: 3.7 mmol/L (ref 3.5–5.1)
Sodium: 135 mmol/L (ref 135–145)

## 2017-11-21 LAB — CBC
HEMATOCRIT: 34 % — AB (ref 36.0–46.0)
HEMATOCRIT: 35.2 % — AB (ref 36.0–46.0)
Hemoglobin: 11.4 g/dL — ABNORMAL LOW (ref 12.0–15.0)
Hemoglobin: 12 g/dL (ref 12.0–15.0)
MCH: 30.6 pg (ref 26.0–34.0)
MCH: 30.9 pg (ref 26.0–34.0)
MCHC: 33.5 g/dL (ref 30.0–36.0)
MCHC: 34.1 g/dL (ref 30.0–36.0)
MCV: 91.2 fL (ref 78.0–100.0)
MCV: 90.7 fL (ref 78.0–100.0)
Platelets: 215 10*3/uL (ref 150–400)
Platelets: 222 10*3/uL (ref 150–400)
RBC: 3.73 MIL/uL — AB (ref 3.87–5.11)
RBC: 3.88 MIL/uL (ref 3.87–5.11)
RDW: 15.7 % — ABNORMAL HIGH (ref 11.5–15.5)
RDW: 15.5 % (ref 11.5–15.5)
WBC: 10.1 10*3/uL (ref 4.0–10.5)
WBC: 6.4 10*3/uL (ref 4.0–10.5)

## 2017-11-21 LAB — FOLATE: Folate: 23.6 ng/mL (ref >5.9)

## 2017-11-21 LAB — SEDIMENTATION RATE: Sed Rate: 25 mm/hr — ABNORMAL HIGH (ref 0–22)

## 2017-11-21 LAB — TSH: TSH: 2.55 u[IU]/mL (ref 0.350–4.500)

## 2017-11-21 LAB — VITAMIN B12: Vitamin B-12: 450 pg/mL (ref 180–914)

## 2017-11-21 MED ORDER — ASPIRIN EC 81 MG PO TBEC
162.0000 mg | DELAYED_RELEASE_TABLET | Freq: Every evening | ORAL | Status: DC
  Administered 2017-11-22 – 2017-11-23 (×2): 162 mg via ORAL
  Filled 2017-11-21 (×2): qty 2

## 2017-11-21 MED ORDER — LORAZEPAM 2 MG/ML IJ SOLN: 2.0000 mg | INTRAMUSCULAR | Status: DC

## 2017-11-21 MED ORDER — LORAZEPAM 2 MG/ML IJ SOLN
2.0000 mg | INTRAMUSCULAR | Status: DC | PRN
  Administered 2017-11-21 – 2017-11-24 (×6): 2 mg via INTRAVENOUS
  Filled 2017-11-21 (×6): qty 1

## 2017-11-21 MED ORDER — SODIUM CHLORIDE 0.9 % IV SOLN
100.0000 mg | Freq: Two times a day (BID) | INTRAVENOUS | Status: DC
  Administered 2017-11-21 – 2017-11-22 (×3): 100 mg via INTRAVENOUS
  Filled 2017-11-21 (×5): qty 10

## 2017-11-21 MED ORDER — LACOSAMIDE 200 MG/20ML IV SOLN
INTRAVENOUS | Status: AC
Start: 2017-11-21 — End: 2017-11-21
  Filled 2017-11-21: qty 20

## 2017-11-21 NOTE — Evaluation (Signed)
Physical Therapy Evaluation Patient Details Name: [name] MRN: [id number] DOB: Jan 28, 1946 Today's Date: 11/21/2017   History of Present Illness  [name] is a 72 y.o. female with medical history significant for hypertension, meningioma, encephalomalacia with prior CVA, and seizure disorder who was brought to the emergency department by her husband on account of vague symptoms of confusion associated with some combativeness as well as weakness particularly to her right upper extremity.  He actually brought her to the ED on 4/11 at which point she was sent home as her workup was negative.  Unfortunately, symptoms have recurred despite taking her medications as prescribed at home.  Patient cannot give any significant history due to her condition.    Clinical Impression  Patient presents very confused with wrist restraints to BUE due to attempting to climb out of bed per nursing staff.  Patient required much encouragement and only able to participate with scooting up in bed and reposition with Max/total assist, otherwise patient refused all other functional activity.  Will check on patient daily to see if mentation and level of cooperative improves.  Patient will benefit from continued physical therapy in hospital and recommended venue below to increase strength, balance, endurance for safe ADLs and gait.    Follow Up Recommendations SNF    Equipment Recommendations  None recommended by PT(to be determined)    Recommendations for Other Services       Precautions / Restrictions Precautions Precautions: Fall Restrictions Weight Bearing Restrictions: No      Mobility  Bed Mobility Overal bed mobility: Needs Assistance             General bed mobility comments: Max assist to scoot up in bed and reposition, patient refused to attempt sitting up  Transfers                    Ambulation/Gait                Stairs            Wheelchair Mobility     Modified Rankin (Stroke Patients Only)       Balance                                             Pertinent Vitals/Pain Pain Assessment: Faces Pain Score: 0-No pain    Home Living Family/patient expects to be discharged to:: Private residence Living Arrangements: Children Available Help at Discharge: Family Type of Home: House Home Access: Stairs to enter Entrance Stairs-Rails: Right Entrance Stairs-Number of Steps: 3 Home Layout: One level Home Equipment: Shower seat Additional Comments: Patient is a poor historian    Prior Function Level of Independence: Needs assistance   Gait / Transfers Assistance Needed: Supervised household gait  ADL's / Homemaking Assistance Needed: Pt requires assistance with bathing and dressing.        Hand Dominance   Dominant Hand: Right    Extremity/Trunk Assessment   Upper Extremity Assessment Upper Extremity Assessment: Overall WFL for tasks assessed    Lower Extremity Assessment Lower Extremity Assessment: Generalized weakness    Cervical / Trunk Assessment Cervical / Trunk Assessment: Normal  Communication   Communication: No difficulties  Cognition Arousal/Alertness: Awake/alert Behavior During Therapy: Restless;Agitated Overall Cognitive Status: No family/caregiver present to determine baseline cognitive functioning Area of Impairment: Safety/judgement  General Comments      Exercises     Assessment/Plan    PT Assessment Patient needs continued PT services  PT Problem List Decreased strength;Decreased activity tolerance;Decreased balance;Decreased mobility       PT Treatment Interventions Gait training;Functional mobility training;Therapeutic activities;Therapeutic exercise;Stair training;Patient/family education    PT Goals (Current goals can be found in the Care Plan section)  Acute Rehab PT Goals Patient Stated Goal: go home PT  Goal Formulation: With patient Time For Goal Achievement: 12/12/17 Potential to Achieve Goals: Fair    Frequency Min 3X/week   Barriers to discharge        Co-evaluation               AM-PAC PT "6 Clicks" Daily Activity  Outcome Measure Difficulty turning over in bed (including adjusting bedclothes, sheets and blankets)?: Unable Difficulty moving from lying on back to sitting on the side of the bed? : Unable Difficulty sitting down on and standing up from a chair with arms (e.g., wheelchair, bedside commode, etc,.)?: Unable Help needed moving to and from a bed to chair (including a wheelchair)?: Total Help needed walking in hospital room?: Total Help needed climbing 3-5 steps with a railing? : Total 6 Click Score: 6    End of Session   Activity Tolerance: Treatment limited secondary to agitation Patient left: in bed;with call bell/phone within reach;with bed alarm set Nurse Communication: Mobility status;Other (comment)(RN informed that patient non-cooperative with getting up) PT Visit Diagnosis: Unsteadiness on feet (R26.81);Other abnormalities of gait and mobility (R26.89);Muscle weakness (generalized) (M62.81)    Time: 7001-7494 PT Time Calculation (min) (ACUTE ONLY): 21 min   Charges:   PT Evaluation $PT Eval Moderate Complexity: 1 Mod PT Treatments $Therapeutic Activity: 8-22 mins   PT G Codes:        3:25 PM, 2017/12/01 Lonell Grandchild, MPT Physical Therapist with [facility] [phone] office [phone] mobile phone

## 2017-11-21 NOTE — Plan of Care (Signed)
  Problem: Acute Rehab PT Goals(only PT should resolve) Goal: Pt Will Go Supine/Side To Sit Outcome: Progressing Flowsheets (Taken 11/21/2017 1527) Pt will go Supine/Side to Sit: with moderate assist Goal: Patient Will Transfer Sit To/From Stand Outcome: Progressing Flowsheets (Taken 11/21/2017 1527) Patient will transfer sit to/from stand: with moderate assist Goal: Pt Will Transfer Bed To Chair/Chair To Bed Outcome: Progressing Flowsheets (Taken 11/21/2017 1527) Pt will Transfer Bed to Chair/Chair to Bed: with max assist Goal: Pt Will Ambulate Outcome: Progressing Flowsheets (Taken 11/21/2017 1527) Pt will Ambulate: 15 feet;with maximum assist;with rolling walker   3:28 PM, 11/21/17 Lonell Grandchild, MPT Physical Therapist with [facility] [phone] office [phone] mobile phone

## 2017-11-21 NOTE — Progress Notes (Signed)
Patient appears oriented in person and agitated at present wants to get out of bed.  I believe she is overwhelmed with the husband as far as care after her stay in [facility].  I am doing hepatic profile as well as serum ammonia level and dementia panel to be certain there is no other metabolic abnormalities causing agitation and confusion.  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  Will increase frequency of Ativan to every 4 hours from every 6 hours as well as arm restraints for patient safety.  I believe she will be [place] SNF placement. [name] SWF:[id number] DOB: [birth year] DOA: 11/20/2017 PCP: Lucia Gaskins, MD   Physical Exam: Blood pressure 139/90, pulse 67, temperature 98.2 F (36.8 C), temperature source Oral, resp. rate 18, height 5\' 4"  (1.626 m), weight 50 kg (110 lb 3.7 oz), SpO2 100 %.  Lungs show prolonged expiratory phase scattered rhonchi no rales no wheeze appreciable heart regular rhythm no S3 S1 adhesions rubs patient using right arm today attempting to get out of bed.  Heart regular rhythm no S3-S4 no he feels rubs   Investigations:  No results found for this or any previous visit (from the past 240 hour(s)).   Basic Metabolic Panel: Recent Labs    11/20/17 1610 11/21/17 0617  NA 136 135  K 4.5 3.7  CL 100* 97*  CO2 22 26  GLUCOSE 107* 91  BUN 11 14  CREATININE 0.76 0.72  CALCIUM 9.7 9.4   Liver Function Tests: Recent Labs    11/20/17 1610  AST 15  ALT 6*  ALKPHOS 38  BILITOT 0.4  PROT 8.3*  ALBUMIN 4.0     CBC: Recent Labs    11/20/17 1610 11/21/17 0617  WBC 13.8* 10.1  NEUTROABS 11.1*  --   HGB 12.6 11.4*  HCT 37.5 34.0*  MCV 91.9 91.2  PLT 234 215    Dg Chest 2 View  Result Date: 11/20/2017 CLINICAL DATA:  Altered mental status. EXAM: CHEST - 2 VIEW COMPARISON:  Chest x-ray dated September 24, 2017. FINDINGS: The heart size and mediastinal contours are within normal limits. Normal pulmonary  vascularity. Atherosclerotic calcification of the aortic arch. The lungs remain hyperinflated. No focal consolidation, pleural effusion, or pneumothorax. No acute osseous abnormality. IMPRESSION: COPD.  No active cardiopulmonary disease. Electronically Signed   By: Titus Dubin M.D.   On: 11/20/2017 16:38   Ct Head Wo Contrast  Result Date: 11/20/2017 CLINICAL DATA:  Altered mental status, aggression, stroke, seizures EXAM: CT HEAD WITHOUT CONTRAST TECHNIQUE: Contiguous axial images were obtained from the base of the skull through the vertex without intravenous contrast. Sagittal and coronal MPR images reconstructed from axial data set. COMPARISON:  11/15/2017 FINDINGS: Brain: Scattered motion artifacts degrade exam despite repeat imaging. Dilatation of the atrium and occipital horn of the RIGHT lateral ventricle. Remaining ventricular system normal appearance. No midline shift or mass effect. Old large posterior RIGHT parietal, small LEFT frontal, and small LEFT parietal vertex infarcts. Small vessel chronic ischemic changes of deep cerebral white matter. No intracranial hemorrhage, mass lesion, or evidence of acute infarction identified. No extra-axial fluid collections. Vascular: Atherosclerotic calcification of internal carotid and vertebrobasilar arteries at skull base. Skull: No gross acute osseous findings. Prior LEFT parietal craniotomy. Sinuses/Orbits: Grossly clear Other: N/A IMPRESSION: Atrophy with small vessel chronic ischemic changes of deep cerebral white matter. Old BILATERAL cortical infarcts. No definite acute intracranial abnormalities identified on exam limited by motion  artifacts. Electronically Signed   By: Lavonia Dana M.D.   On: 11/20/2017 16:54   Mr Brain Wo Contrast  Result Date: 11/21/2017 CLINICAL DATA:  Altered mental status. EXAM: MRI HEAD WITHOUT CONTRAST TECHNIQUE: Multiplanar, multiecho pulse sequences of the brain and surrounding structures were obtained without  intravenous contrast. COMPARISON:  CT yesterday.  MRI 09/24/2017 FINDINGS: Brain: The patient would not tolerate complete scanning. Motion degraded diffusion imaging does not show any acute or subacute infarction. Chronic atrophy, encephalomalacia and gliosis in the parietal regions right more than left and in the left posterior frontal region. No sign of mass lesion, hemorrhage, obstructive hydrocephalus or extra-axial collection. Ex vacuo enlargement of the lateral ventricles right more than left. Vascular: Major vessels at the base of the brain show flow. Skull and upper cervical spine: Previous left frontoparietal craniotomy. Sinuses/Orbits: No sinus disease seen. Other: None detected. IMPRESSION: Abbreviated in motion degraded exam. No acute finding. Chronic insults as above. Electronically Signed   By: Nelson Chimes M.D.   On: 11/21/2017 09:31      Medications:   Impression:  Principal Problem:   Acute encephalopathy Active Problems:   History of CVA (cerebrovascular accident)   Meningioma ([facility])   Hypothyroidism   Seizure disorder, nonconvulsive, with status epilepticus ([place])   CVA (cerebral vascular accident) ([place])     Plan: Hepatic profile and serum ammonia level as well as dementia panel ordered for today.  Arm restraints for patient safety.  Increase frequency of Ativan to every 4 hours as needed for agitation.  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  Social work consult regarding SNF placement  Consultants: Neurology requested   Procedures   Antibiotics:          Time spent: 40 minutes   LOS: 0 days   Bj Morlock M   11/21/2017, 1:12 PM

## 2017-11-21 NOTE — Care Management Note (Signed)
Case Management Note  Patient Details  Name: [name] MRN: [id number] Date of Birth: [birth year]  Subjective/Objective:   Brief assessment of patient, information provided by husband. Patient has had a recent stay at [facility]. Recently finished home health PT with [facility]. If home health is needed again, husband reports he would want to use [facility] as he has had them in the past and really liked them. CSW has also been consulted and has provided a list of STR facilities. Husband aware that a PT evaluation would be needed and would help [place] address what patient needs prior to DC.                  Action/Plan: CM following for needs.    Expected Discharge Date:      unk            Expected Discharge Plan:     In-House Referral:  Clinical Social Work  Discharge planning Services  CM Consult  Post Acute Care Choice:    Choice offered to:     DME Arranged:    DME Agency:     HH Arranged:    HH Agency:     Status of Service:  In process, will continue to follow  If discussed at Long Length of Stay Meetings, dates discussed:    Additional Comments:  Siren Porrata, Chauncey Reading, RN 11/21/2017, 12:47 PM

## 2017-11-21 NOTE — Clinical Social Work Note (Signed)
CSW provided SNF list to patient's sister who was at bedside. CSW advised that PT evaluation would need to address patient's rehab level needs.    [place] Vieau, Clydene Pugh, LCSW

## 2017-11-21 NOTE — Care Management Obs Status (Signed)
[place] NOTIFICATION   Patient Details  Name: TONJUA ROSSETTI MRN: [id number] Date of Birth: November 25, 1945   Medicare Observation Status Notification Given:  Yes    Renner Sebald, Chauncey Reading, RN 11/21/2017, 12:46 PM

## 2017-11-21 NOTE — Progress Notes (Signed)
Patient remains agitated and anxious, moving and screaming during vitals check. Catapres and ativan provided. Will continue to monitor.

## 2017-11-21 NOTE — Progress Notes (Signed)
Pt passed nursing bedside swallow eval. No s/s aspiration noted

## 2017-11-21 NOTE — Consult Note (Signed)
[place] A. [name], MD     [email or web address]          [name] is an 72 y.o. female.   ASSESSMENT/PLAN: 1. 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 She has deviated eyes to the right suggestive of left hemispheric status epilepticus. Consequently, she will be loaded with Vimpat. We will continue with the Depakote and phenobarbital. An EEG will be obtained.   2. Combative behavior: No metabolic disturbance uncovered at this time.additionally, no structural lesions that are new. Suspect due to multiple seizures.  3. Status post removal of meningioma stable:   4. Remote large vessel infarcts: Continue the patient on aspirin 162 mg.      The patient is 72 year old black female who presents with confusion and combative behavior. She previously presented with similar events in the past with EEG showing frequent left temporal epileptiform discharges. The patient was noted to have right-sided weakness as a presenting finding The above all suggesting that the patient may indeed have another episode of nonconvulsive status epilepticus. She is quite confused and not cooperative with evaluation which limits the history. She's had repeat imaging which does not show acute infarct. She previously has had a worsening behavior while on Keppra. This medication was discontinued on the last admission. The review systems is limited given the confusion.       GENERAL: She is unresponsive.  HEENT: supple without trauma  ABDOMEN: soft  EXTREMITIES: No edema   BACK: normal  SKIN: Normal by inspection.    MENTAL STATUS: There is no eye opening even to deep painful stimuli. She most and groans and states a few words but they're in incomprehensible. She does not follow commands.   CRANIAL NERVES: Pupils are mid position and reactive. Eyes are deviated towards the right. She does  have oculocephalic reflexes passively. Facial muscle strength is symmetric. Corneal reflexes are present.  MOTOR: She moves both sides well and vigorously.  COORDINATION: No tremors are appreciated. Noyoclonus dysmetria or parkinsonism.  REFLEXES: Deep tendon reflexes are symmetrical and normal.   SENSATION: she responds to painful some light bilaterally.       NEURO NOTE 09-2017 1. 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 2. Significant behavioral disruption which I believe is in part due to multiple psychotropic medications including Keppra: 3. Remote infarcts: The patient should be in a full dose aspirin.   RECOMMENDATION: 1. We will start the patient on IV valproic acid. We'll give her thousand milligrams and started maintenance dose of 500 mg 3 times a day. I suspect that a lot of the behavioral issues are due to [place] which is known to cause bradycardia rhythm disruption. When she is on a good dose of valproic acid, I will discontinue the Keppra. I will obtain a stat dose of phenobarbital. We should try to minimize her psychotropic medications. Consider reducing the dose of the Seroquel and Haldol.     The patient is a 72 year old black female who is known to my service for the last few years. She has had recurrent episodes of acute dysarthria with negative imaging for infarcts. 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 She was started on Keppra after hospitalization in [place] in 2017.  She did see [place] in follow-up once but has now returned for follow-up. It appears that she is on multiple psychotropic medications since we last saw the patient in 2017. The family reports that she has had some  behavioral issues. She has not had these problems in the past which I suspect is likely due to the initiation of Keppra. Again, she presents with another episode of worsening dysarthria. The niece also reports that she seemed to have visual problems on the right side. She also has significant altered mental status. Imaging failed to show acute infarcts. She has undergone excision of meningioma on the left because result this was concerning to her seizures. She apparently has done well with this until recently.         Blood pressure (!) 180/99, pulse 90, temperature (!) 97.5 F (36.4 C), temperature source Oral, resp. rate 16, height _0  (1.626 m), weight 110 lb 3.7 oz (50 kg), SpO2 93 %.  Past Medical History:  Diagnosis Date  . Aphasia   . GI bleed 12/01/2016  . Hypertension   . Seizures ([place])   . Stroke [facility]) 2009, 2015 9/27, 11/2014  . Thyroid disease     Past Surgical History:  Procedure Laterality Date  . ABDOMINAL HYSTERECTOMY    . COLONOSCOPY N/A 12/03/2016   Procedure: COLONOSCOPY;  Surgeon: Daneil Dolin, MD;  Location[facility];  Service: Endoscopy;  Laterality: N/A;  . CRANIOTOMY Left 06/18/2015   Procedure: CRANIOTOMY TUMOR EXCISION w/Curve;  Surgeon: Consuella Lose, MD;  Location: [facility]URO ORS;  Service: Neurosurgery;  Laterality: Left;  stereotactic Left Craniotomy for resection of meningioma  . PEG TUBE PLACEMENT    . PEG TUBE REMOVAL      Family History  Problem Relation Age of Onset  . Coronary artery disease Unknown        Stents   . Stroke Father        Hemorrhagic   . Sudden death Father   . Ulcers Mother        Bleeding  . Colon cancer Sister     Social History:  reports that she has been smoking cigarettes.  She has a 70.00 pack-year smoking history. She has never used smokeless tobacco. She reports that she does not drink alcohol or use drugs.  Allergies:  Allergies  Allergen Reactions  . Codeine Nausea And Vomiting and Rash     Medications: Prior to Admission medications   Medication Sig Start Date End Date Taking? Authorizing Provider  amitriptyline (ELAVIL) 10 MG tablet Take 20 mg by mouth at bedtime.   Yes [provider]  amLODipine (NORVASC) 5 MG tablet Take 1 tablet (5 mg total) by mouth daily. 09/29/17  Yes Dondiego, Richard, MD  aspirin EC 81 MG tablet Take 81 mg by mouth every evening.   Yes [provider]  atorvastatin (LIPITOR) 40 MG tablet Take 40 mg by mouth every evening.    Yes [provider]  cholecalciferol (VITAMIN D) 400 units TABS tablet Take 400 Units by mouth daily.   Yes [provider]  cloNIDine (CATAPRES) 0.1 MG tablet Take 1 tablet (0.1 mg total) by mouth 2 (two) times daily. 09/28/17  Yes Dondiego, Delfino Lovett, MD  levothyroxine (SYNTHROID, LEVOTHROID) 25 MCG tablet Take 25 mcg by mouth daily before breakfast.   Yes [provider]  PHENobarbital (LUMINAL) 97.2 MG tablet Take 1 tablet (97.2 mg total) by mouth at bedtime. 11/13/14  Yes Lucia Gaskins, MD  QUEtiapine (SEROQUEL)  50 MG tablet Take 1 tablet (50 mg total) by mouth at bedtime. 09/28/17  Yes Dondiego, Richard, MD  valproic acid (DEPAKENE) 250 MG capsule Take 2 capsules (500 mg total) by mouth 3 (three) times daily. 09/28/17  Yes Lucia Gaskins, MD    Scheduled Meds: . amitriptyline  20 mg Oral QHS  . amLODipine  5 mg Oral Daily  . aspirin EC  81 mg Oral QPM  . atorvastatin  40 mg Oral QPM  . cholecalciferol  400 Units Oral Daily  . cloNIDine  0.1 mg Oral BID  . enoxaparin (LOVENOX) injection  40 mg Subcutaneous Q24H  . levothyroxine  25 mcg Oral QAC breakfast  . PHENobarbital  97.2 mg Oral QHS  . valproic acid  500 mg Oral TID   Continuous Infusions: PRN Meds:.acetaminophen **OR** acetaminophen, LORazepam, ondansetron **OR** ondansetron (ZOFRAN) IV     Results for orders placed or performed during the hospital encounter of 11/20/17 (from the past 48 hour(s))  Ammonia      Status: None   Collection Time: 11/20/17  3:20 PM  Result Value Ref Range   Ammonia 23 9 - 35 umol/L    Comment: Performed at [facility], [address], [place], [place] [address]  Comprehensive metabolic panel     Status: Abnormal   Collection Time: 11/20/17  4:10 PM  Result Value Ref Range   Sodium 136 135 - 145 mmol/L   Potassium 4.5 3.5 - 5.1 mmol/L   Chloride 100 (L) 101 - 111 mmol/L   CO2 22 22 - 32 mmol/L   Glucose, Bld 107 (H) 65 - 99 mg/dL   BUN 11 6 - 20 mg/dL   Creatinine, Ser 0.76 0.44 - 1.00 mg/dL   Calcium 9.7 8.9 - 10.3 mg/dL   Total Protein 8.3 (H) 6.5 - 8.1 g/dL   Albumin 4.0 3.5 - 5.0 g/dL   AST 15 15 - 41 U/L   ALT 6 (L) 14 - 54 U/L   Alkaline Phosphatase 38 38 - 126 U/L   Total Bilirubin 0.4 0.3 - 1.2 mg/dL   GFR calc non Af Amer >60 >60 mL/min   GFR calc Af Amer >60 >60 mL/min    Comment: (NOTE) The eGFR has been calculated using the CKD EPI equation. This calculation has not been validated in all clinical situations. eGFR's persistently <60 mL/min signify possible Chronic Kidney Disease.    Anion gap 14 5 - 15    Comment: Performed at [facility], [address] [place], [place] [address]  CBC with Differential     Status: Abnormal   Collection Time: 11/20/17  4:10 PM  Result Value Ref Range   WBC 13.8 (H) 4.0 - 10.5 K/uL   RBC 4.08 3.87 - 5.11 MIL/uL   Hemoglobin 12.6 12.0 - 15.0 g/dL   HCT 37.5 36.0 - 46.0 %   MCV 91.9 78.0 - 100.0 fL   MCH 30.9 26.0 - 34.0 pg   MCHC 33.6 30.0 - 36.0 g/dL   RDW 16.0 (H) 11.5 - 15.5 %   Platelets 234 150 - 400 K/uL   Neutrophils Relative % 80 %   Neutro Abs 11.1 (H) 1.7 - 7.7 K/uL   Lymphocytes Relative 13 %   Lymphs Abs 1.8 0.7 - 4.0 K/uL   Monocytes Relative 7 %   Monocytes Absolute 0.9 0.1 - 1.0 K/uL   Eosinophils Relative 0 %   Eosinophils Absolute 0.0 0.0 - 0.7 K/uL   Basophils Relative 0 %  Basophils Absolute 0.0 0.0 - 0.1 K/uL    Comment: Performed at [facility], [address]  [place], [place] [address]  Troponin I     Status: None   Collection Time: 11/20/17  4:10 PM  Result Value Ref Range   Troponin I <0.03 <0.03 ng/mL    Comment: Performed at [facility], [address], [place], [place] [address]  Urinalysis, Routine w reflex microscopic     Status: Abnormal   Collection Time: 11/20/17  6:10 PM  Result Value Ref Range   Color, Urine STRAW (A) YELLOW   APPearance CLEAR CLEAR   Specific Gravity, Urine 1.011 1.005 - 1.030   pH 8.0 5.0 - 8.0   Glucose, UA NEGATIVE NEGATIVE mg/dL   Hgb urine dipstick MODERATE (A) NEGATIVE   Bilirubin Urine NEGATIVE NEGATIVE   Ketones, ur NEGATIVE NEGATIVE mg/dL   Protein, ur NEGATIVE NEGATIVE mg/dL   Nitrite NEGATIVE NEGATIVE   Leukocytes, UA NEGATIVE NEGATIVE   RBC / HPF 6-30 0 - 5 RBC/hpf   WBC, UA 0-5 0 - 5 WBC/hpf   Bacteria, UA NONE SEEN NONE SEEN   Squamous Epithelial / LPF 0-5 (A) NONE SEEN    Comment: Performed at [facility], [address] [place], [place] [address]  TSH     Status: None   Collection Time: 11/20/17  6:49 PM  Result Value Ref Range   TSH 1.930 0.350 - 4.500 uIU/mL    Comment: Performed by a 3rd Generation assay with a functional sensitivity of <=0.01 uIU/mL. Performed at [facility], [address], [place], [place] [address]   TSH     Status: None   Collection Time: 11/20/17  7:41 PM  Result Value Ref Range   TSH 1.376 0.350 - 4.500 uIU/mL    Comment: Performed by a 3rd Generation assay with a functional sensitivity of <=0.01 uIU/mL. Performed at [facility], [address] [place], [place] [address]   Valproic acid level     Status: Abnormal   Collection Time: 11/20/17  7:41 PM  Result Value Ref Range   Valproic Acid Lvl 101 (H) 50.0 - 100.0 ug/mL    Comment: Performed at [facility], [address], [place], [place] [address]  Phenobarbital level     Status: Abnormal   Collection Time: 11/20/17  7:41 PM  Result Value Ref Range   Phenobarbital 12.3 (L) 15.0 - 30.0 ug/mL    Comment:  Performed at [facility], [address] [place], [place] [address]  Basic metabolic panel     Status: Abnormal   Collection Time: 11/21/17  6:17 AM  Result Value Ref Range   Sodium 135 135 - 145 mmol/L   Potassium 3.7 3.5 - 5.1 mmol/L    Comment: DELTA CHECK NOTED   Chloride 97 (L) 101 - 111 mmol/L   CO2 26 22 - 32 mmol/L   Glucose, Bld 91 65 - 99 mg/dL   BUN 14 6 - 20 mg/dL   Creatinine, Ser 0.72 0.44 - 1.00 mg/dL   Calcium 9.4 8.9 - 10.3 mg/dL   GFR calc non Af Amer >60 >60 mL/min   GFR calc Af Amer >60 >60 mL/min    Comment: (NOTE) The eGFR has been calculated using the CKD EPI equation. This calculation has not been validated in all clinical situations. eGFR's persistently <60 mL/min signify possible Chronic Kidney Disease.    Anion gap 12 5 - 15    Comment: Performed at [facility], [place]  [address] [place], [place] [address]  CBC     Status: Abnormal   Collection Time: 11/21/17  6:17 AM  Result Value Ref Range   WBC 10.1 4.0 - 10.5 K/uL   RBC 3.73 (L) 3.87 - 5.11 MIL/uL   Hemoglobin 11.4 (L) 12.0 - 15.0 g/dL   HCT 34.0 (L) 36.0 - 46.0 %   MCV 91.2 78.0 - 100.0 fL   MCH 30.6 26.0 - 34.0 pg   MCHC 33.5 30.0 - 36.0 g/dL   RDW 15.7 (H) 11.5 - 15.5 %   Platelets 215 150 - 400 K/uL    Comment: Performed at [facility], [address] [place], [place] [address]  Hepatic function panel     Status: Abnormal   Collection Time: 11/21/17  3:07 PM  Result Value Ref Range   Total Protein 7.7 6.5 - 8.1 g/dL   Albumin 3.7 3.5 - 5.0 g/dL   AST 16 15 - 41 U/L   ALT 9 (L) 14 - 54 U/L   Alkaline Phosphatase 41 38 - 126 U/L   Total Bilirubin 0.8 0.3 - 1.2 mg/dL   Bilirubin, Direct <0.1 (L) 0.1 - 0.5 mg/dL   Indirect Bilirubin NOT CALCULATED 0.3 - 0.9 mg/dL    Comment: Performed at [facility], [address] [place], [place] [address]  Ammonia     Status: None   Collection Time: 11/21/17  3:07 PM  Result Value Ref Range   Ammonia 26 9 - 35 umol/L    Comment: Performed at [facility], [address], [place], [place] [address]  Sedimentation rate     Status: Abnormal   Collection Time: 11/21/17  3:07 PM  Result Value Ref Range   Sed Rate 25 (H) 0 - 22 mm/hr    Comment: Performed at [facility], [address] [place], [place] [address]  CBC     Status: Abnormal   Collection Time: 11/21/17  3:07 PM  Result Value Ref Range   WBC 6.4 4.0 - 10.5 K/uL   RBC 3.88 3.87 - 5.11 MIL/uL   Hemoglobin 12.0 12.0 - 15.0 g/dL   HCT 35.2 (L) 36.0 - 46.0 %   MCV 90.7 78.0 - 100.0 fL   MCH 30.9 26.0 - 34.0 pg   MCHC 34.1 30.0 - 36.0 g/dL   RDW 15.5 11.5 - 15.5 %   Platelets 222 150 - 400 K/uL    Comment: Performed at [facility], [address], [place], [place] [address]  TSH     Status: None   Collection Time: 11/21/17  3:07 PM  Result Value Ref Range   TSH 2.550 0.350 - 4.500 uIU/mL    Comment: Performed by a 3rd Generation assay with a functional sensitivity of <=0.01 uIU/mL. Performed at [facility], [address], [place], [place] [address]     Studies/Results:  BRAIN MRI FINDINGS: Brain: The patient would not tolerate complete scanning. Motion degraded diffusion imaging does not show any acute or subacute infarction. Chronic atrophy, encephalomalacia and gliosis in the parietal regions right more than left and in the left posterior frontal region. No sign of mass lesion, hemorrhage, obstructive hydrocephalus or extra-axial collection. Ex vacuo enlargement of the lateral ventricles right more than left.  Vascular: Major vessels at the base of the brain show flow.  Skull and upper cervical spine: Previous left frontoparietal craniotomy.  Sinuses/Orbits: No sinus disease seen.  Other: None detected.  IMPRESSION: Abbreviated in motion degraded exam. No acute finding. Chronic insults as  above.    Brain MRI is reviewed in person and shows global atrophy. There is large right occipital encephalomalacia and a lesser left occipital encephalomalacia.  These are chronic findings. No acute findings are seen. The studies limited due to movement and it appears all sequences are were not done.    Laterrian Hevener A. [name], M.D.  Diplomate, Tax adviser of Psychiatry and Neurology ( Neurology). 11/21/2017, 7:09 PM

## 2017-11-22 ENCOUNTER — Observation Stay (HOSPITAL_COMMUNITY)
Admit: 2017-11-22 | Discharge: 2017-11-22 | Disposition: A | Discharge status: Home / Self Care | Payer: Medicare HMO | Attending: Neurology | Admitting: Neurology

## 2017-11-22 ENCOUNTER — Encounter (HOSPITAL_COMMUNITY): Payer: Self-pay

## 2017-11-22 LAB — BASIC METABOLIC PANEL
Anion gap: 16 — ABNORMAL HIGH (ref 5–15)
BUN: 21 mg/dL — AB (ref 6–20)
CO2: 23 mmol/L (ref 22–32)
CREATININE: 0.73 mg/dL (ref 0.44–1.00)
Calcium: 9.6 mg/dL (ref 8.9–10.3)
Chloride: 97 mmol/L — ABNORMAL LOW (ref 101–111)
GFR calc Af Amer: >60 mL/min (ref >60)
GLUCOSE: 95 mg/dL (ref 65–99)
POTASSIUM: 4.4 mmol/L (ref 3.5–5.1)
SODIUM: 136 mmol/L (ref 135–145)

## 2017-11-22 LAB — URINE CULTURE: Culture: NO GROWTH

## 2017-11-22 LAB — HIV ANTIBODY (ROUTINE TESTING W REFLEX): HIV Screen 4th Generation wRfx: NONREACTIVE

## 2017-11-22 LAB — RPR: RPR Ser Ql: NONREACTIVE

## 2017-11-22 MED ORDER — SODIUM CHLORIDE 0.9 % IV SOLN
INTRAVENOUS | Status: DC
  Administered 2017-11-22 – 2017-11-24 (×4): via INTRAVENOUS

## 2017-11-22 MED ORDER — SODIUM CHLORIDE 0.9 % IV SOLN
150.0000 mg | Freq: Two times a day (BID) | INTRAVENOUS | Status: DC
  Administered 2017-11-23 – 2017-11-24 (×3): 150 mg via INTRAVENOUS
  Filled 2017-11-22 (×8): qty 15

## 2017-11-22 NOTE — Procedures (Signed)
  [place] A. [name], MD     [email or web address]           HISTORY: This is a 72 year old female has a known history of epilepsy. She presents with altered mental status worrisome for her ongoing seizures causing the alteration in mentation.  MEDICATIONS: Scheduled Meds: . amitriptyline  20 mg Oral QHS  . amLODipine  5 mg Oral Daily  . aspirin EC  162 mg Oral QPM  . atorvastatin  40 mg Oral QPM  . cholecalciferol  400 Units Oral Daily  . cloNIDine  0.1 mg Oral BID  . enoxaparin (LOVENOX) injection  40 mg Subcutaneous Q24H  . levothyroxine  25 mcg Oral QAC breakfast  . PHENobarbital  97.2 mg Oral QHS  . valproic acid  500 mg Oral TID   Continuous Infusions: . sodium chloride 75 mL/hr at 11/22/17 1148  . lacosamide (VIMPAT) IV Stopped (11/22/17 0930)   PRN Meds:.acetaminophen **OR** acetaminophen, LORazepam, ondansetron **OR** ondansetron (ZOFRAN) IV  Prior to Admission medications   Medication Sig Start Date End Date Taking? Authorizing Provider  amitriptyline (ELAVIL) 10 MG tablet Take 20 mg by mouth at bedtime.   Yes [provider]  amLODipine (NORVASC) 5 MG tablet Take 1 tablet (5 mg total) by mouth daily. 09/29/17  Yes Dondiego, Richard, MD  aspirin EC 81 MG tablet Take 81 mg by mouth every evening.   Yes [provider]  atorvastatin (LIPITOR) 40 MG tablet Take 40 mg by mouth every evening.    Yes [provider]  cholecalciferol (VITAMIN D) 400 units TABS tablet Take 400 Units by mouth daily.   Yes [provider]  cloNIDine (CATAPRES) 0.1 MG tablet Take 1 tablet (0.1 mg total) by mouth 2 (two) times daily. 09/28/17  Yes Dondiego, Delfino Lovett, MD  levothyroxine (SYNTHROID, LEVOTHROID) 25 MCG tablet Take 25 mcg by mouth daily before breakfast.   Yes [provider]  PHENobarbital (LUMINAL) 97.2 MG tablet Take 1 tablet (97.2 mg total) by mouth at bedtime. 11/13/14  Yes Lucia Gaskins, MD  QUEtiapine (SEROQUEL) 50  MG tablet Take 1 tablet (50 mg total) by mouth at bedtime. 09/28/17  Yes Dondiego, Richard, MD  valproic acid (DEPAKENE) 250 MG capsule Take 2 capsules (500 mg total) by mouth 3 (three) times daily. 09/28/17  Yes Lucia Gaskins, MD      ANALYSIS: A 16 channel recording using standard 10 20 measurements is conducted for 21 minutes. The background activity is as high as 9 Hz. Recording is replete with spindles and K complexes indicating stage II non-REM sleep. Photic stimulation and hyperventilation are not conducted. Focal slowing is not observed. Lateral slowing is also not seen. No clear epileptiform activities are observed.   IMPRESSION: 1. This is a normal recording of the awake and sleep states.      Latroya Ng A. [name], M.D.  Diplomate, Tax adviser of Psychiatry and Neurology ( Neurology).

## 2017-11-22 NOTE — Progress Notes (Signed)
Roscoe A. [name], MD     [email or web address]          [name] is an 72 y.o. female.   ASSESSMENT/PLAN: 1. 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 She has deviated eyes to the right suggestive of left hemispheric status epilepticus.  Again no metabolic disturbance or structural lesions.  Medication effect may be an issue.  I will continue with the current 3 antiepileptic medications.  The Vimpat will be increased.  I will discontinue the amitriptyline and the clonidine which can cause cognitive impairment.  2. Combative behavior: No metabolic disturbance uncovered at this time.additionally, no structural lesions that are new. Suspect due to multiple seizures.  3. Status post removal of meningioma stable: No evidence of recurrence on MRI.  4. Remote large vessel infarcts: Continue the patient on aspirin 162 mg.  5.  Worsening vision etiology unclear but could be due to seizures: Consider ophthalmological consultation.   The family is at the bedside.  They reported that the patient has had worsening visual problems over the last 2 weeks.     GENERAL: She is a little agitated. She is more responsive today.  HEENT: supple without trauma  ABDOMEN: soft  EXTREMITIES: No edema   BACK: normal  SKIN: Normal by inspection.    MENTAL STATUS: She is awake and alert.  She does not follow commands.  She is talking in 3 word sentences and is perseverating about her eye.   CRANIAL NERVES: Pupils are mid position and reactive. Eyes are deviated towards the right.  She does have full ocular movements with oculocephalic reflexes.  Facial muscle strength shows some mild left lower facial weakness.   MOTOR: There is a left-sided hemiparesis especially involving the left upper extremity with increased tone/spasticity.  She moves the right side well.  She moves the left  side but clearly is weaker on that side.  COORDINATION: No tremors are appreciated. No myoclonus dysmetria or parkinsonism.  SENSATION: she responds to painful some light bilaterally.       NEURO NOTE 09-2017 1. 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 2. Significant behavioral disruption which I believe is in part due to multiple psychotropic medications including Keppra: 3. Remote infarcts: The patient should be in a full dose aspirin.   RECOMMENDATION: 1. We will start the patient on IV valproic acid. We'll give her thousand milligrams and started maintenance dose of 500 mg 3 times a day. I suspect that a lot of the behavioral issues are due to [place] which is known to cause bradycardia rhythm disruption. When she is on a good dose of valproic acid, I will discontinue the Keppra. I will obtain a stat dose of phenobarbital. We should try to minimize her psychotropic medications. Consider reducing the dose of the Seroquel and Haldol.     The patient is a 72 year old black female who is known to my service for the last few years. She has had recurrent episodes of acute dysarthria with negative imaging for infarcts. 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 She was started on Keppra after hospitalization in [place] in 2017. She did see [place] in follow-up once but has now returned for follow-up. It appears that  she is on multiple psychotropic medications since we last saw the patient in 2017. The family reports that she has had some behavioral issues. She has not had these problems in the past which I suspect is likely due to the initiation of Keppra. Again, she presents with another episode of worsening  dysarthria. The niece also reports that she seemed to have visual problems on the right side. She also has significant altered mental status. Imaging failed to show acute infarcts. She has undergone excision of meningioma on the left because result this was concerning to her seizures. She apparently has done well with this until recently.         Blood pressure (!) 157/95, pulse 72, temperature (!) 97.2 F (36.2 C), temperature source Oral, resp. rate 16, height 5' 4"  (1.626 m), weight 110 lb 3.7 oz (50 kg), SpO2 100 %.  Past Medical History:  Diagnosis Date  . Aphasia   . GI bleed 12/01/2016  . Hypertension   . Seizures ([place])   . Stroke [facility]) 2009, 2015 9/27, 11/2014  . Thyroid disease     Past Surgical History:  Procedure Laterality Date  . ABDOMINAL HYSTERECTOMY    . COLONOSCOPY N/A 12/03/2016   Procedure: COLONOSCOPY;  Surgeon: Daneil Dolin, MD;  Location: [facility];  Service: Endoscopy;  Laterality: N/A;  . CRANIOTOMY Left 06/18/2015   Procedure: CRANIOTOMY TUMOR EXCISION w/Curve;  Surgeon: Consuella Lose, MD;  Location: [facility];  Service: Neurosurgery;  Laterality: Left;  stereotactic Left Craniotomy for resection of meningioma  . PEG TUBE PLACEMENT    . PEG TUBE REMOVAL      Family History  Problem Relation Age of Onset  . Coronary artery disease Unknown        Stents   . Stroke Father        Hemorrhagic   . Sudden death Father   . Ulcers Mother        Bleeding  . Colon cancer Sister     Social History:  reports that she has been smoking cigarettes.  She has a 75.00 pack-year smoking history. She has never used smokeless tobacco. She reports that she does not drink alcohol or use drugs.  Allergies:  Allergies  Allergen Reactions  . Codeine Nausea And Vomiting and Rash    Medications: Prior to Admission medications   Medication Sig Start Date End Date Taking? Authorizing Provider  amitriptyline (ELAVIL) 10 MG tablet Take 20 mg by mouth at  bedtime.   Yes [provider]  amLODipine (NORVASC) 5 MG tablet Take 1 tablet (5 mg total) by mouth daily. 09/29/17  Yes Dondiego, Richard, MD  aspirin EC 81 MG tablet Take 81 mg by mouth every evening.   Yes [provider]  atorvastatin (LIPITOR) 40 MG tablet Take 40 mg by mouth every evening.    Yes [provider]  cholecalciferol (VITAMIN D) 400 units TABS tablet Take 400 Units by mouth daily.   Yes [provider]  cloNIDine (CATAPRES) 0.1 MG tablet Take 1 tablet (0.1 mg total) by mouth 2 (two) times daily. 09/28/17  Yes Dondiego, Delfino Lovett, MD  levothyroxine (SYNTHROID, LEVOTHROID) 25 MCG tablet Take 25 mcg by mouth daily before breakfast.   Yes [provider]  PHENobarbital (LUMINAL) 97.2 MG tablet Take 1 tablet (97.2 mg total) by mouth at bedtime. 11/13/14  Yes Lucia Gaskins, MD  QUEtiapine (SEROQUEL) 50 MG tablet Take 1 tablet (50 mg total) by mouth at bedtime. 09/28/17  Yes  Lucia Gaskins, MD  valproic acid (DEPAKENE) 250 MG capsule Take 2 capsules (500 mg total) by mouth 3 (three) times daily. 09/28/17  Yes Lucia Gaskins, MD    Scheduled Meds: . amLODipine  5 mg Oral Daily  . aspirin EC  162 mg Oral QPM  . atorvastatin  40 mg Oral QPM  . cholecalciferol  400 Units Oral Daily  . enoxaparin (LOVENOX) injection  40 mg Subcutaneous Q24H  . levothyroxine  25 mcg Oral QAC breakfast  . PHENobarbital  97.2 mg Oral QHS  . valproic acid  500 mg Oral TID   Continuous Infusions: . sodium chloride 75 mL/hr at 11/22/17 1148  . lacosamide (VIMPAT) IV Stopped (11/22/17 0930)   PRN Meds:.acetaminophen **OR** acetaminophen, LORazepam, ondansetron **OR** ondansetron (ZOFRAN) IV     Results for orders placed or performed during the hospital encounter of 11/20/17 (from the past 48 hour(s))  Basic metabolic panel     Status: Abnormal   Collection Time: 11/21/17  6:17 AM  Result Value Ref Range   Sodium 135 135 - 145 mmol/L   Potassium 3.7  3.5 - 5.1 mmol/L    Comment: DELTA CHECK NOTED   Chloride 97 (L) 101 - 111 mmol/L   CO2 26 22 - 32 mmol/L   Glucose, Bld 91 65 - 99 mg/dL   BUN 14 6 - 20 mg/dL   Creatinine, Ser 0.72 0.44 - 1.00 mg/dL   Calcium 9.4 8.9 - 10.3 mg/dL   GFR calc non Af Amer >60 >60 mL/min   GFR calc Af Amer >60 >60 mL/min    Comment: (NOTE) The eGFR has been calculated using the CKD EPI equation. This calculation has not been validated in all clinical situations. eGFR's persistently <60 mL/min signify possible Chronic Kidney Disease.    Anion gap 12 5 - 15    Comment: Performed at [facility], [address] [place], [place] [address]  CBC     Status: Abnormal   Collection Time: 11/21/17  6:17 AM  Result Value Ref Range   WBC 10.1 4.0 - 10.5 K/uL   RBC 3.73 (L) 3.87 - 5.11 MIL/uL   Hemoglobin 11.4 (L) 12.0 - 15.0 g/dL   HCT 34.0 (L) 36.0 - 46.0 %   MCV 91.2 78.0 - 100.0 fL   MCH 30.6 26.0 - 34.0 pg   MCHC 33.5 30.0 - 36.0 g/dL   RDW 15.7 (H) 11.5 - 15.5 %   Platelets 215 150 - 400 K/uL    Comment: Performed at [facility], [address] [place], [place] [address]  Hepatic function panel     Status: Abnormal   Collection Time: 11/21/17  3:07 PM  Result Value Ref Range   Total Protein 7.7 6.5 - 8.1 g/dL   Albumin 3.7 3.5 - 5.0 g/dL   AST 16 15 - 41 U/L   ALT 9 (L) 14 - 54 U/L   Alkaline Phosphatase 41 38 - 126 U/L   Total Bilirubin 0.8 0.3 - 1.2 mg/dL   Bilirubin, Direct <0.1 (L) 0.1 - 0.5 mg/dL   Indirect Bilirubin NOT CALCULATED 0.3 - 0.9 mg/dL    Comment: Performed at [facility], [address], [place], [place] [address]  Ammonia     Status: None   Collection Time: 11/21/17  3:07 PM  Result Value Ref Range   Ammonia 26 9 - 35 umol/L    Comment: Performed at [facility], [address] [place], [place] [address]  RPR  Status: None   Collection Time: 11/21/17  3:07 PM  Result Value Ref Range   RPR Ser Ql Non Reactive Non Reactive    Comment: (NOTE) Performed At: [facility] [place], [place] [id number] Rush Farmer MD [id number] Performed at [facility], [address] [place], [place] [address]   Vitamin B12     Status: None   Collection Time: 11/21/17  3:07 PM  Result Value Ref Range   Vitamin B-12 450 180 - 914 pg/mL    Comment: (NOTE) This assay is not validated for testing neonatal or myeloproliferative syndrome specimens for Vitamin B12 levels. Performed at [facility], [place] [address] [place], [place] [address]   Sedimentation rate     Status: Abnormal   Collection Time: 11/21/17  3:07 PM  Result Value Ref Range   Sed Rate 25 (H) 0 - 22 mm/hr    Comment: Performed at [facility], [address], [place], [place] [address]  CBC     Status: Abnormal   Collection Time: 11/21/17  3:07 PM  Result Value Ref Range   WBC 6.4 4.0 - 10.5 K/uL   RBC 3.88 3.87 - 5.11 MIL/uL   Hemoglobin 12.0 12.0 - 15.0 g/dL   HCT 35.2 (L) 36.0 - 46.0 %   MCV 90.7 78.0 - 100.0 fL   MCH 30.9 26.0 - 34.0 pg   MCHC 34.1 30.0 - 36.0 g/dL   RDW 15.5 11.5 - 15.5 %   Platelets 222 150 - 400 K/uL    Comment: Performed at [facility], [address] [place], [place] [address]  TSH     Status: None   Collection Time: 11/21/17  3:07 PM  Result Value Ref Range   TSH 2.550 0.350 - 4.500 uIU/mL    Comment: Performed by a 3rd Generation assay with a functional sensitivity of <=0.01 uIU/mL. Performed at [facility], [address] [place], [place] [address]   Folate     Status: None   Collection Time: 11/21/17  3:07 PM  Result Value Ref Range   Folate 23.6 >5.9 ng/mL    Comment: Performed at [facility] [address] [place], [place] [address]  HIV antibody (routine testing) (NOT for [facility])     Status: None   Collection Time: 11/21/17  3:07 PM  Result Value Ref Range   HIV Screen 4th Generation wRfx Non Reactive Non Reactive    Comment: (NOTE) Performed At: [facility] [place], [place]  [id number] Rush Farmer MD [id number] Performed at [facility], [address] [place], [place] [address]   Basic metabolic panel     Status: Abnormal   Collection Time: 11/22/17 10:26 AM  Result Value Ref Range   Sodium 136 135 - 145 mmol/L   Potassium 4.4 3.5 - 5.1 mmol/L   Chloride 97 (L) 101 - 111 mmol/L   CO2 23 22 - 32 mmol/L   Glucose, Bld 95 65 - 99 mg/dL   BUN 21 (H) 6 - 20 mg/dL   Creatinine, Ser 0.73 0.44 - 1.00 mg/dL   Calcium 9.6 8.9 - 10.3 mg/dL   GFR calc non Af Amer >60 >60 mL/min   GFR calc Af Amer >60 >60 mL/min    Comment: (NOTE) The eGFR has been calculated using the CKD EPI equation. This calculation has not been validated in all clinical situations. eGFR's persistently <60 mL/min signify possible Chronic Kidney Disease.    Anion gap  16 (H) 5 - 15    Comment: Performed at [facility], [address] [place], [place] [address]    Studies/Results:  BRAIN MRI FINDINGS: Brain: The patient would not tolerate complete scanning. Motion degraded diffusion imaging does not show any acute or subacute infarction. Chronic atrophy, encephalomalacia and gliosis in the parietal regions right more than left and in the left posterior frontal region. No sign of mass lesion, hemorrhage, obstructive hydrocephalus or extra-axial collection. Ex vacuo enlargement of the lateral ventricles right more than left.  Vascular: Major vessels at the base of the brain show flow.  Skull and upper cervical spine: Previous left frontoparietal craniotomy.  Sinuses/Orbits: No sinus disease seen.  Other: None detected.  IMPRESSION: Abbreviated in motion degraded exam. No acute finding. Chronic insults as above.    Brain MRI is reviewed in person and shows global atrophy. There is large right occipital encephalomalacia and a lesser left occipital encephalomalacia. These are chronic findings. No acute findings are seen. The studies limited due to movement and it appears  all sequences are were not done.    [name] A. [name], M.D.  Diplomate, Tax adviser of Psychiatry and Neurology ( Neurology). 11/22/2017, 7:43 PM

## 2017-11-22 NOTE — Clinical Social Work Note (Signed)
[employer] Medicare authorization started for STR at SNF.   LCSW left a message for patient's husband requesting return contact to discuss discharge plan.     Tiawanna Luchsinger, Clydene Pugh, LCSW

## 2017-11-22 NOTE — Clinical Social Work Note (Signed)
Clinical Social Work Assessment  Patient Details  Name: [name] MRN: [id number] Date of Birth: [birth year]  Date of referral:  11/22/17               Reason for consult:  Facility Placement                Permission sought to share information with:    Permission granted to share information::     Name::        Agency::     Relationship::     Contact Information:  [name], spouse  Housing/Transportation Living arrangements for the past 2 months:  Single Family Home Source of Information:  Spouse Patient Interpreter Needed:  None Criminal Activity/Legal Involvement Pertinent to Current Situation/Hospitalization:  No - Comment as needed Significant Relationships:  Adult Children, Other Family Members Lives with:  Spouse Do you feel safe going back to the place where you live?  Yes Need for family participation in patient care:  Yes (Comment)  Care giving concerns:  None identified at baseline.    Social Worker assessment / plan:  Patient went to [facility] for six days in Feb. And desired to go home. Prior to this admission patient ambulated without assistive devices and her husband guided her.  She was able to take showers independently.  Mr. Paterson prepares meals for the family. He is agreeable to STR at [facility]    Employment status:    Insurance informationEducational psychologist PT Recommendations:  [place] / Referral to community resources:  [place]  Patient/Family's Response to care:  Mr. Sinning is agreeable to SNF for STR.   Patient/Family's Understanding of and Emotional Response to Diagnosis, Current Treatment, and Prognosis:  Mr. Espericueta understands patient's diagnosis, treatment and prognosis and is hopeful that patient will complete STR at SNF so that she can function on an improved level at home.   Emotional Assessment Appearance:  Appears stated age Attitude/Demeanor/Rapport:  Other Affect (typically observed):    Orientation:   Oriented to Self Alcohol / Substance use:  Not Applicable Psych involvement (Current and /or in the community):  No (Comment)  Discharge Needs  Concerns to be addressed:    Readmission within the last 30 days:  No Current discharge risk:  None Barriers to Discharge:  Insurance Authorization   Ihor Gully, LCSW 11/22/2017, 2:58 PM

## 2017-11-22 NOTE — Progress Notes (Signed)
EEG completed, results pending. 

## 2017-11-22 NOTE — NC FL2 (Signed)
[place] MEDICAID FL2 LEVEL OF CARE SCREENING TOOL     IDENTIFICATION  Patient Name: [name] Birthdate: October 24, 1945 Sex: female Admission Date (Current Location): 11/20/2017  [facility] and [place] Number:  [employer] and Address:  [place] [address] [place]      Provider Number: [id number]  Attending Physician Name and Address:  Lucia Gaskins, MD  Relative Name and Phone Number:       Current Level of Care: Other (Comment)(observation) Recommended Level of Care: [place] Prior Approval Number:    Date Approved/Denied:   PASRR Number: [id number]  Discharge Plan: SNF    Current Diagnoses: Patient Active Problem List   Diagnosis Date Noted  . CVA (cerebral vascular accident) ([place]) 11/20/2017  . Seizure disorder, nonconvulsive, with status epilepticus ([place]) 09/26/2017  . Dysarthria 09/24/2017  . GI bleed 12/01/2016  . Acute blood loss anemia 12/01/2016  . Lower GI bleed   . Hemi-neglect of right side   . Hypothyroidism 01/19/2016  . Depression 01/19/2016  . Speech disturbance 01/19/2016  . Hyponatremia 01/19/2016  . Acute encephalopathy 01/19/2016  . Meningioma ([place]) 11/12/2014  . HTN (hypertension) 11/10/2014  . Tobacco use 11/10/2014  . History of CVA (cerebrovascular accident) 05/03/2014    Orientation RESPIRATION BLADDER Height & Weight     Self  Normal Incontinent Weight: 110 lb 3.7 oz (50 kg) Height:  5\' 4"  (162.6 cm)  BEHAVIORAL SYMPTOMS/MOOD NEUROLOGICAL BOWEL NUTRITION STATUS      Continent Diet(see discharge summary)  AMBULATORY STATUS COMMUNICATION OF NEEDS Skin   Extensive Assist Verbally Normal                       Personal Care Assistance Level of Assistance  Bathing, Feeding, Dressing Bathing Assistance: Limited assistance Feeding assistance: Limited assistance Dressing Assistance: Limited assistance     Functional Limitations Info  Sight, Hearing, Speech  Sight Info: Adequate Hearing Info: Adequate Speech Info: Adequate    SPECIAL CARE FACTORS FREQUENCY  PT (By licensed PT)     PT Frequency: 5x/week              Contractures Contractures Info: Not present    Additional Factors Info  Allergies, Psychotropic, Code Status Code Status Info: Full Code Allergies Info: Codeine Psychotropic Info: Seroquel, Depakote         Current Medications (11/22/2017):  This is the current hospital active medication list Current Facility-Administered Medications  Medication Dose Route Frequency Provider Last Rate Last Dose  . acetaminophen (TYLENOL) tablet 650 mg  650 mg Oral Q6H PRN Manuella Ghazi, Pratik D, DO       Or  . acetaminophen (TYLENOL) suppository 650 mg  650 mg Rectal Q6H PRN Manuella Ghazi, Pratik D, DO      . amitriptyline (ELAVIL) tablet 20 mg  20 mg Oral QHS Shah, Pratik D, DO   20 mg at 11/21/17 2148  . amLODipine (NORVASC) tablet 5 mg  5 mg Oral Daily Manuella Ghazi, Pratik D, DO   5 mg at 11/22/17 0900  . aspirin EC tablet 162 mg  162 mg Oral QPM [place], Kofi, MD      . atorvastatin (LIPITOR) tablet 40 mg  40 mg Oral QPM Shah, Pratik D, DO   40 mg at 11/21/17 1759  . cholecalciferol (VITAMIN D) tablet 400 Units  400 Units Oral Daily Heath Lark D, DO   400 Units at 11/22/17 0900  . cloNIDine (CATAPRES) tablet 0.1 mg  0.1 mg Oral BID Manuella Ghazi, Pratik D, DO   0.1 mg at 11/22/17 0900  . enoxaparin (LOVENOX) injection 40 mg  40 mg Subcutaneous Q24H Manuella Ghazi, Pratik D, DO   40 mg at 11/21/17 1927  . lacosamide (VIMPAT) 100 mg in sodium chloride 0.9 % 25 mL IVPB  100 mg Intravenous Q12H Phillips Odor, MD   Stopped at 11/22/17 0930  . levothyroxine (SYNTHROID, LEVOTHROID) tablet 25 mcg  25 mcg Oral QAC breakfast Heath Lark D, DO   25 mcg at 11/22/17 0843  . LORazepam (ATIVAN) injection 2 mg  2 mg Intravenous Q4H PRN Lucia Gaskins, MD   2 mg at 11/21/17 2234  . ondansetron (ZOFRAN) tablet 4 mg  4 mg Oral Q6H PRN Manuella Ghazi, Pratik D, DO       Or  . ondansetron  (ZOFRAN) injection 4 mg  4 mg Intravenous Q6H PRN Manuella Ghazi, Pratik D, DO      . PHENobarbital (LUMINAL) tablet 97.2 mg  97.2 mg Oral QHS Shah, Pratik D, DO   97.2 mg at 11/21/17 2149  . valproic acid (DEPAKENE) 250 MG capsule 500 mg  500 mg Oral TID Manuella Ghazi, Pratik D, DO   500 mg at 11/22/17 0900     Discharge Medications: Please see discharge summary for a list of discharge medications.  Relevant Imaging Results:  Relevant Lab Results:   Additional Information SSN [id number] Clydene Pugh, LCSW

## 2017-11-22 NOTE — Progress Notes (Signed)
Dementia workup unrevealing RPR thyroid functions HIV are within normal parameters.  Hepatic profile within normal parameters and ammonia level is 26 we are awaiting neurology consult for acute on chronic exacerbation of dementia or altered mental status patient less agitated today than yesterday. [name] MPN:[id number] DOB: Nov 04, 1945 DOA: 11/20/2017 PCP: Lucia Gaskins, MD   Physical Exam: Blood pressure 124/86, pulse 69, temperature 97.8 F (36.6 C), temperature source Oral, resp. rate 16, height 5\' 4"  (1.626 m), weight 50 kg (110 lb 3.7 oz), SpO2 99 %.  Lungs clear to A&P no rales rhonchi heart regular rhythmwithout effusions or rubs abdomen soft nontender bowel sounds normoactive patient moves all 4 extremities alert and recognizes me   Investigations:  Recent Results (from the past 240 hour(s))  Urine culture     Status: None   Collection Time: 11/20/17  6:10 PM  Result Value Ref Range Status   Specimen Description   Final    URINE, CATHETERIZED Performed at [facility], [address], [place], [place] [address]    Special Requests   Final    NONE Performed at [facility], [address] [place], [place] [address]    Culture   Final    NO GROWTH Performed at [facility], [place] [address], [place], [place] [address]    Report Status 11/22/2017 FINAL  Final     Basic Metabolic Panel: Recent Labs    11/21/17 0617 11/22/17 1026  NA 135 136  K 3.7 4.4  CL 97* 97*  CO2 26 23  GLUCOSE 91 95  BUN 14 21*  CREATININE 0.72 0.73  CALCIUM 9.4 9.6   Liver Function Tests: Recent Labs    11/20/17 1610 11/21/17 1507  AST 15 16  ALT 6* 9*  ALKPHOS 38 41  BILITOT 0.4 0.8  PROT 8.3* 7.7  ALBUMIN 4.0 3.7     CBC: Recent Labs    11/20/17 1610 11/21/17 0617 11/21/17 1507  WBC 13.8* 10.1 6.4  NEUTROABS 11.1*  --   --   HGB 12.6 11.4* 12.0  HCT 37.5 34.0* 35.2*  MCV 91.9 91.2 90.7  PLT 234 215 222    Dg Chest 2 View  Result Date:  11/20/2017 CLINICAL DATA:  Altered mental status. EXAM: CHEST - 2 VIEW COMPARISON:  Chest x-ray dated September 24, 2017. FINDINGS: The heart size and mediastinal contours are within normal limits. Normal pulmonary vascularity. Atherosclerotic calcification of the aortic arch. The lungs remain hyperinflated. No focal consolidation, pleural effusion, or pneumothorax. No acute osseous abnormality. IMPRESSION: COPD.  No active cardiopulmonary disease. Electronically Signed   By: Titus Dubin M.D.   On: 11/20/2017 16:38   Ct Head Wo Contrast  Result Date: 11/20/2017 CLINICAL DATA:  Altered mental status, aggression, stroke, seizures EXAM: CT HEAD WITHOUT CONTRAST TECHNIQUE: Contiguous axial images were obtained from the base of the skull through the vertex without intravenous contrast. Sagittal and coronal MPR images reconstructed from axial data set. COMPARISON:  11/15/2017 FINDINGS: Brain: Scattered motion artifacts degrade exam despite repeat imaging. Dilatation of the atrium and occipital horn of the RIGHT lateral ventricle. Remaining ventricular system normal appearance. No midline shift or mass effect. Old large posterior RIGHT parietal, small LEFT frontal, and small LEFT parietal vertex infarcts. Small vessel chronic ischemic changes of deep cerebral white matter. No intracranial hemorrhage, mass lesion, or evidence of acute infarction identified. No extra-axial fluid collections. Vascular: Atherosclerotic calcification of internal carotid and vertebrobasilar arteries at skull base. Skull: No gross acute  osseous findings. Prior LEFT parietal craniotomy. Sinuses/Orbits: Grossly clear Other: N/A IMPRESSION: Atrophy with small vessel chronic ischemic changes of deep cerebral white matter. Old BILATERAL cortical infarcts. No definite acute intracranial abnormalities identified on exam limited by motion artifacts. Electronically Signed   By: Lavonia Dana M.D.   On: 11/20/2017 16:54   Mr Brain Wo  Contrast  Result Date: 11/21/2017 CLINICAL DATA:  Altered mental status. EXAM: MRI HEAD WITHOUT CONTRAST TECHNIQUE: Multiplanar, multiecho pulse sequences of the brain and surrounding structures were obtained without intravenous contrast. COMPARISON:  CT yesterday.  MRI 09/24/2017 FINDINGS: Brain: The patient would not tolerate complete scanning. Motion degraded diffusion imaging does not show any acute or subacute infarction. Chronic atrophy, encephalomalacia and gliosis in the parietal regions right more than left and in the left posterior frontal region. No sign of mass lesion, hemorrhage, obstructive hydrocephalus or extra-axial collection. Ex vacuo enlargement of the lateral ventricles right more than left. Vascular: Major vessels at the base of the brain show flow. Skull and upper cervical spine: Previous left frontoparietal craniotomy. Sinuses/Orbits: No sinus disease seen. Other: None detected. IMPRESSION: Abbreviated in motion degraded exam. No acute finding. Chronic insults as above. Electronically Signed   By: Nelson Chimes M.D.   On: 11/21/2017 09:31      Medications:   Impression:  Principal Problem:   Acute encephalopathy Active Problems:   History of CVA (cerebrovascular accident)   Meningioma (HCC)   Hypothyroidism   Seizure disorder, nonconvulsive, with status epilepticus ([place])   CVA (cerebral vascular accident) ([place])     Plan: Await neurology consult hepatic profile dementia profile and serum ammonia level within normal parameters.  Consultants: Neurology requested   Procedures   Antibiotics:          Time spent: 30 minutes   LOS: 0 days   Sadira Standard M   11/22/2017, 12:28 PM

## 2017-11-23 LAB — BASIC METABOLIC PANEL
Anion gap: 15 (ref 5–15)
BUN: 17 mg/dL (ref 6–20)
CHLORIDE: 98 mmol/L — AB (ref 101–111)
CO2: 23 mmol/L (ref 22–32)
Calcium: 9.2 mg/dL (ref 8.9–10.3)
Creatinine, Ser: 0.62 mg/dL (ref 0.44–1.00)
GFR calc non Af Amer: >60 mL/min (ref >60)
Glucose, Bld: 85 mg/dL (ref 65–99)
POTASSIUM: 4 mmol/L (ref 3.5–5.1)
Sodium: 136 mmol/L (ref 135–145)

## 2017-11-23 LAB — LEVETIRACETAM LEVEL: LEVETIRACETAM: NOT DETECTED ug/mL (ref 10.0–40.0)

## 2017-11-23 MED ORDER — QUETIAPINE FUMARATE 25 MG PO TABS
50.0000 mg | ORAL_TABLET | Freq: Every day | ORAL | Status: DC
  Administered 2017-11-23: 50 mg via ORAL
  Filled 2017-11-23: qty 2

## 2017-11-23 NOTE — Progress Notes (Signed)
Appreciate neurologic consultation.  Patient started on valproic acid 500 mg p.o. 3 times daily.  Patient has possible nonconvulsive seizures.  Agitated combative behavior outstripping family's ability to care for her.  Remote infarcts of CNS.  Status post excision of meningioma. [name]:[id number] DOB: 22-Feb-1946 DOA: 11/20/2017 PCP: Brandi Gaskins, MD   Physical Exam: Blood pressure (!) 162/89, pulse 75, temperature 98.2 F (36.8 C), temperature source Oral, resp. rate 18, height 5\' 4"  (1.626 [name]), weight 50 kg (110 lb 3.7 oz), SpO2 97 %.  Lungs diminished breath sounds in the bases prolonged expiratory phase no rales wheezes or rhonchi appreciable heart regular rhythm no S3-S4 no heaves or rubs moves all 4 extremities at present   Investigations:  Recent Results (from the past 240 hour(s))  Urine culture     Status: None   Collection Time: 11/20/17  6:10 PM  Result Value Ref Range Status   Specimen Description   Final    URINE, CATHETERIZED Performed at [facility], [address], [place], [place] [address]    Special Requests   Final    NONE Performed at [facility], [address] [place], [place] [address]    Culture   Final    NO GROWTH Performed at [facility], [place] [address], [place], [place] [address]    Report Status 11/22/2017 FINAL  Final     Basic Metabolic Panel: Recent Labs    11/22/17 1026 11/23/17 0630  NA 136 136  K 4.4 4.0  CL 97* 98*  CO2 23 23  GLUCOSE 95 85  BUN 21* 17  CREATININE 0.73 0.62  CALCIUM 9.6 9.2   Liver Function Tests: Recent Labs    11/20/17 1610 11/21/17 1507  AST 15 16  ALT 6* 9*  ALKPHOS 38 41  BILITOT 0.4 0.8  PROT 8.3* 7.7  ALBUMIN 4.0 3.7     CBC: Recent Labs    11/20/17 1610 11/21/17 0617 11/21/17 1507  WBC 13.8* 10.1 6.4  NEUTROABS 11.1*  --   --   HGB 12.6 11.4* 12.0  HCT 37.5 34.0* 35.2*  MCV 91.9 91.2 90.7  PLT 234 215 222    Mr Brain Wo Contrast  Result Date:  11/21/2017 CLINICAL DATA:  Altered mental status. EXAM: MRI HEAD WITHOUT CONTRAST TECHNIQUE: Multiplanar, multiecho pulse sequences of the brain and surrounding structures were obtained without intravenous contrast. COMPARISON:  CT yesterday.  MRI 09/24/2017 FINDINGS: Brain: The patient would not tolerate complete scanning. Motion degraded diffusion imaging does not show any acute or subacute infarction. Chronic atrophy, encephalomalacia and gliosis in the parietal regions right more than left and in the left posterior frontal region. No sign of mass lesion, hemorrhage, obstructive hydrocephalus or extra-axial collection. Ex vacuo enlargement of the lateral ventricles right more than left. Vascular: Major vessels at the base of the brain show flow. Skull and upper cervical spine: Previous left frontoparietal craniotomy. Sinuses/Orbits: No sinus disease seen. Other: None detected. IMPRESSION: Abbreviated in motion degraded exam. No acute finding. Chronic insults as above. Electronically Signed   By: Nelson Chimes [name].D.   On: 11/21/2017 09:31      Medications:   Impression:  Principal Problem:   Acute encephalopathy Active Problems:   History of CVA (cerebrovascular accident)   Meningioma (HCC)   Hypothyroidism   Seizure disorder, nonconvulsive, with status epilepticus ([place])   CVA (cerebral vascular accident) ([place])     Plan: Discontinue clonidine.  Valproic acid 500 mg p.o. 3 times  daily as ordered by neurology monitor behavior await EEG  Consultants: Neurology   Procedures EEG  Antibiotics:       Time spent: 30 minutes   LOS: 0 days   [name]   11/23/2017, 7:34 AM

## 2017-11-23 NOTE — Progress Notes (Signed)
[place] A. [name], MD     [email or web address]          [name] is an 72 y.o. female.   ASSESSMENT/PLAN: 1. 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 She has deviated eyes to the right suggestive of left hemispheric status epilepticus.  Again no metabolic disturbance or structural lesions.  Medication effect may be an issue.  I will continue with the current 3 antiepileptic medications.     2. Combative behavior: No metabolic disturbance uncovered at this time.additionally, no structural lesions that are new. Suspect due to multiple seizures. He is on when necessary doses of Ativan. I may consider low-dose Seroquel. This will be given at nighttime.  3. Status post removal of meningioma stable: No evidence of recurrence on MRI.  4. Remote large vessel infarcts: Continue the patient on aspirin 162 mg.  5.  Worsening vision etiology unclear but could be due to seizures: Consider ophthalmological consultation.      She is resting quietly today but was given Ativan approximately 4 because of restlessness and agitation.      GENERAL: She is sleeping.  HEENT: supple without trauma  ABDOMEN: soft  EXTREMITIES: No edema   BACK: normal  SKIN: Normal by inspection.    MENTAL STATUS:  She lays in bed with eyes closed.She owns her eyes to light sternal rub. She responds yes or no and attempts to follow commands. She seems less agitated at this time.   CRANIAL NERVES: Pupils are mid position and reactive. Eyes are normal with deviated. There are mid position. She does have full ocular movements with oculocephalic reflexes.  Facial muscle strength shows some mild left lower facial weakness.   MOTOR: There is a left-sided hemiparesis especially involving the left upper extremity with increased tone/spasticity.  She moves the right side well.  She moves the left side  but clearly is weaker on that side.  COORDINATION: No tremors are appreciated. No myoclonus dysmetria or parkinsonism.  SENSATION: she responds to painful some light bilaterally.       NEURO NOTE 09-2017 1. 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 2. Significant behavioral disruption which I believe is in part due to multiple psychotropic medications including Keppra: 3. Remote infarcts: The patient should be in a full dose aspirin.   RECOMMENDATION: 1. We will start the patient on IV valproic acid. We'll give her thousand milligrams and started maintenance dose of 500 mg 3 times a day. I suspect that a lot of the behavioral issues are due to [place] which is known to cause bradycardia rhythm disruption. When she is on a good dose of valproic acid, I will discontinue the Keppra. I will obtain a stat dose of phenobarbital. We should try to minimize her psychotropic medications. Consider reducing the dose of the Seroquel and Haldol.     The patient is a 72 year old black female who is known to my service for the last few years. She has had recurrent episodes of acute dysarthria with negative imaging for infarcts. 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 She was started on Keppra after hospitalization in [place] in 2017. She did see [place] in follow-up once but has now returned for follow-up. It  appears that she is on multiple psychotropic medications since we last saw the patient in 2017. The family reports that she has had some behavioral issues. She has not had these problems in the past which I suspect is likely due to the initiation of Keppra. Again, she presents with another episode of worsening  dysarthria. The niece also reports that she seemed to have visual problems on the right side. She also has significant altered mental status. Imaging failed to show acute infarcts. She has undergone excision of meningioma on the left because result this was concerning to her seizures. She apparently has done well with this until recently.         Blood pressure (!) 163/80, pulse 95, temperature 98.2 F (36.8 C), temperature source Oral, resp. rate 19, height 5' 4" (1.626 m), weight 110 lb 3.7 oz (50 kg), SpO2 99 %.  Past Medical History:  Diagnosis Date  . Aphasia   . GI bleed 12/01/2016  . Hypertension   . Seizures ([place])   . Stroke [facility]) 2009, 2015 9/27, 11/2014  . Thyroid disease     Past Surgical History:  Procedure Laterality Date  . ABDOMINAL HYSTERECTOMY    . COLONOSCOPY N/A 12/03/2016   Procedure: COLONOSCOPY;  Surgeon: Daneil Dolin, MD;  Location: [facility];  Service: Endoscopy;  Laterality: N/A;  . CRANIOTOMY Left 06/18/2015   Procedure: CRANIOTOMY TUMOR EXCISION w/Curve;  Surgeon: Consuella Lose, MD;  Location: [place] NEURO ORS;  Service: Neurosurgery;  Laterality: Left;  stereotactic Left Craniotomy for resection of meningioma  . PEG TUBE PLACEMENT    . PEG TUBE REMOVAL      Family History  Problem Relation Age of Onset  . Coronary artery disease Unknown        Stents   . Stroke Father        Hemorrhagic   . Sudden death Father   . Ulcers Mother        Bleeding  . Colon cancer Sister     Social History:  reports that she has been smoking cigarettes.  She has a 75.00 pack-year smoking history. She has never used smokeless tobacco. She reports that she does not drink alcohol or use drugs.  Allergies:  Allergies  Allergen Reactions  . Codeine Nausea And Vomiting and Rash    Medications: Prior to Admission medications   Medication Sig Start Date End Date Taking? Authorizing Provider  amitriptyline (ELAVIL) 10 MG tablet Take 20 mg by mouth at bedtime.    Yes [provider]  amLODipine (NORVASC) 5 MG tablet Take 1 tablet (5 mg total) by mouth daily. 09/29/17  Yes Dondiego, Richard, MD  aspirin EC 81 MG tablet Take 81 mg by mouth every evening.   Yes [provider]  atorvastatin (LIPITOR) 40 MG tablet Take 40 mg by mouth every evening.    Yes [provider]  cholecalciferol (VITAMIN D) 400 units TABS tablet Take 400 Units by mouth daily.   Yes [provider]  cloNIDine (CATAPRES) 0.1 MG tablet Take 1 tablet (0.1 mg total) by mouth 2 (two) times daily. 09/28/17  Yes Dondiego, Delfino Lovett, MD  levothyroxine (SYNTHROID, LEVOTHROID) 25 MCG tablet Take 25 mcg by mouth daily before breakfast.   Yes [provider]  PHENobarbital (LUMINAL) 97.2 MG tablet Take 1 tablet (97.2 mg total) by mouth at bedtime. 11/13/14  Yes Lucia Gaskins, MD  QUEtiapine (SEROQUEL) 50 MG tablet Take 1 tablet (50 mg total) by mouth at bedtime. 09/28/17  Yes Dondiego, Richard, MD  valproic acid (DEPAKENE) 250 MG capsule Take 2 capsules (500 mg total) by mouth 3 (three) times daily. 09/28/17  Yes Lucia Gaskins, MD    Scheduled Meds: . amLODipine  5 mg Oral Daily  . aspirin EC  162 mg Oral QPM  . atorvastatin  40 mg Oral QPM  . cholecalciferol  400 Units Oral Daily  . enoxaparin (LOVENOX) injection  40 mg Subcutaneous Q24H  . levothyroxine  25 mcg Oral QAC breakfast  . PHENobarbital  97.2 mg Oral QHS  . valproic acid  500 mg Oral TID   Continuous Infusions: . sodium chloride 75 mL/hr at 11/23/17 1343  . lacosamide (VIMPAT) IV Stopped (11/23/17 1147)   PRN Meds:.acetaminophen **OR** acetaminophen, LORazepam, ondansetron **OR** ondansetron (ZOFRAN) IV     Results for orders placed or performed during the hospital encounter of 11/20/17 (from the past 48 hour(s))  Basic metabolic panel     Status: Abnormal   Collection Time: 11/22/17 10:26 AM  Result Value Ref Range   Sodium 136 135 - 145 mmol/L   Potassium 4.4 3.5 - 5.1  mmol/L   Chloride 97 (L) 101 - 111 mmol/L   CO2 23 22 - 32 mmol/L   Glucose, Bld 95 65 - 99 mg/dL   BUN 21 (H) 6 - 20 mg/dL   Creatinine, Ser 0.73 0.44 - 1.00 mg/dL   Calcium 9.6 8.9 - 10.3 mg/dL   GFR calc non Af Amer >60 >60 mL/min   GFR calc Af Amer >60 >60 mL/min    Comment: (NOTE) The eGFR has been calculated using the CKD EPI equation. This calculation has not been validated in all clinical situations. eGFR's persistently <60 mL/min signify possible Chronic Kidney Disease.    Anion gap 16 (H) 5 - 15    Comment: Performed at [facility], [address], [place], [place] [address]  Basic metabolic panel     Status: Abnormal   Collection Time: 11/23/17  6:30 AM  Result Value Ref Range   Sodium 136 135 - 145 mmol/L   Potassium 4.0 3.5 - 5.1 mmol/L   Chloride 98 (L) 101 - 111 mmol/L   CO2 23 22 - 32 mmol/L   Glucose, Bld 85 65 - 99 mg/dL   BUN 17 6 - 20 mg/dL   Creatinine, Ser 0.62 0.44 - 1.00 mg/dL   Calcium 9.2 8.9 - 10.3 mg/dL   GFR calc non Af Amer >60 >60 mL/min   GFR calc Af Amer >60 >60 mL/min    Comment: (NOTE) The eGFR has been calculated using the CKD EPI equation. This calculation has not been validated in all clinical situations. eGFR's persistently <60 mL/min signify possible Chronic Kidney Disease.    Anion gap 15 5 - 15    Comment: Performed at [facility], [address] [place], [place] [address]    Studies/Results:  BRAIN MRI FINDINGS: Brain: The patient would not tolerate complete scanning. Motion degraded diffusion imaging does not show any acute or subacute infarction. Chronic atrophy, encephalomalacia and gliosis in the parietal regions right more than left and in the left posterior frontal region. No sign of mass lesion, hemorrhage, obstructive hydrocephalus or extra-axial collection. Ex vacuo enlargement of the lateral ventricles right more than left.  Vascular: Major vessels at the base of the brain show flow.  Skull and upper  cervical spine: Previous left frontoparietal craniotomy.  Sinuses/Orbits: No sinus disease seen.  Other: None detected.  IMPRESSION: Abbreviated in motion degraded  exam. No acute finding. Chronic insults as above.    Brain MRI is reviewed in person and shows global atrophy. There is large right occipital encephalomalacia and a lesser left occipital encephalomalacia. These are chronic findings. No acute findings are seen. The studies limited due to movement and it appears all sequences are were not done.     A. [name], M.D.  Diplomate, Tax adviser of Psychiatry and Neurology ( Neurology). 11/23/2017, 6:14 PM

## 2017-11-23 NOTE — Progress Notes (Addendum)
Has needed ativan two times today due to restlessness and family request. Able to swallow medicine in applesauce with coaxing.  Swallows without choking.  Will not answer when asked name.  No signs of seizures. Attempted to feed supper and refused to eat

## 2017-11-24 ENCOUNTER — Inpatient Hospital Stay
Admission: RE | Admit: 2017-11-24 | Discharge: 2017-12-10 | Disposition: A | Discharge status: Home / Self Care | Payer: Medicare HMO | Source: Ambulatory Visit | Attending: Internal Medicine | Admitting: Internal Medicine

## 2017-11-24 LAB — BASIC METABOLIC PANEL
Anion gap: 13 (ref 5–15)
BUN: 17 mg/dL (ref 6–20)
CO2: 19 mmol/L — ABNORMAL LOW (ref 22–32)
CREATININE: 0.74 mg/dL (ref 0.44–1.00)
Calcium: 8.3 mg/dL — ABNORMAL LOW (ref 8.9–10.3)
Chloride: 104 mmol/L (ref 101–111)
GFR calc Af Amer: >60 mL/min (ref >60)
GLUCOSE: 88 mg/dL (ref 65–99)
POTASSIUM: 3.4 mmol/L — AB (ref 3.5–5.1)
Sodium: 136 mmol/L (ref 135–145)

## 2017-11-24 MED ORDER — ASPIRIN 81 MG PO TBEC: 162.0000 mg | DELAYED_RELEASE_TABLET | Freq: Every evening | ORAL | 3 refills | Status: AC

## 2017-11-24 NOTE — [place] FL2 (Deleted)
[place] MEDICAID FL2 LEVEL OF CARE SCREENING TOOL     IDENTIFICATION  Patient Name: [name] Birthdate: March 16, 1946 Sex: female Admission Date (Current Location): 11/20/2017  [facility] and [place] Number:  [employer] and Address:  [place] [address] [place]      Provider Number: [id number]  Attending Physician Name and Address:  Lucia Gaskins, MD  Relative Name and Phone Number:       Current Level of Care: Other (Comment)(observation) Recommended Level of Care: [place] Prior Approval Number:    Date Approved/Denied:   PASRR Number: [id number]  Discharge Plan: SNF    Current Diagnoses: Patient Active Problem List   Diagnosis Date Noted  . CVA (cerebral vascular accident) ([place]) 11/20/2017  . Seizure disorder, nonconvulsive, with status epilepticus ([place]) 09/26/2017  . Dysarthria 09/24/2017  . GI bleed 12/01/2016  . Acute blood loss anemia 12/01/2016  . Lower GI bleed   . Hemi-neglect of right side   . Hypothyroidism 01/19/2016  . Depression 01/19/2016  . Speech disturbance 01/19/2016  . Hyponatremia 01/19/2016  . Acute encephalopathy 01/19/2016  . Meningioma ([place]) 11/12/2014  . HTN (hypertension) 11/10/2014  . Tobacco use 11/10/2014  . History of CVA (cerebrovascular accident) 05/03/2014    Orientation RESPIRATION BLADDER Height & Weight     Self  Normal Incontinent Weight: 110 lb 3.7 oz (50 kg) Height:  5\' 4"  (162.6 cm)  BEHAVIORAL SYMPTOMS/MOOD NEUROLOGICAL BOWEL NUTRITION STATUS      Continent Diet(see discharge summary)  AMBULATORY STATUS COMMUNICATION OF NEEDS Skin   Extensive Assist Verbally Normal                       Personal Care Assistance Level of Assistance  Bathing, Feeding, Dressing Bathing Assistance: Limited assistance Feeding assistance: Limited assistance Dressing Assistance: Limited assistance     Functional Limitations Info  Sight, Hearing, Speech  Sight Info: Adequate Hearing Info: Adequate Speech Info: Adequate    SPECIAL CARE FACTORS FREQUENCY  PT (By licensed PT)     PT Frequency: 5x/week              Contractures Contractures Info: Not present    Additional Factors Info  Allergies, Psychotropic, Code Status Code Status Info: Full Code Allergies Info: Codeine Psychotropic Info: Seroquel, Depakote         Current Medications (11/24/2017):  This is the current hospital active medication list Current Facility-Administered Medications  Medication Dose Route Frequency Provider Last Rate Last Dose  . 0.9 %  sodium chloride infusion   Intravenous Continuous Lucia Gaskins, MD 75 mL/hr at 11/24/17 0236    . acetaminophen (TYLENOL) tablet 650 mg  650 mg Oral Q6H PRN Heath Lark D, DO   650 mg at 11/24/17 5427   Or  . acetaminophen (TYLENOL) suppository 650 mg  650 mg Rectal Q6H PRN Manuella Ghazi, Pratik D, DO      . amLODipine (NORVASC) tablet 5 mg  5 mg Oral Daily Manuella Ghazi, Pratik D, DO   5 mg at 11/24/17 0858  . aspirin EC tablet 162 mg  162 mg Oral QPM Phillips Odor, MD   162 mg at 11/23/17 1723  . atorvastatin (LIPITOR) tablet 40 mg  40 mg Oral QPM Shah, Pratik D, DO   40 mg at 11/23/17 1723  . cholecalciferol (VITAMIN D) tablet 400 Units  400 Units Oral Daily Heath Lark D, DO   400 Units at 11/24/17 0858  . enoxaparin (  LOVENOX) injection 40 mg  40 mg Subcutaneous Q24H Manuella Ghazi, Pratik D, DO   40 mg at 11/23/17 2040  . lacosamide (VIMPAT) 150 mg in sodium chloride 0.9 % 25 mL IVPB  150 mg Intravenous Q12H [place], Kofi, MD 80 mL/hr at 11/24/17 1100 150 mg at 11/24/17 1100  . levothyroxine (SYNTHROID, LEVOTHROID) tablet 25 mcg  25 mcg Oral QAC breakfast Heath Lark D, DO   25 mcg at 11/24/17 0858  . LORazepam (ATIVAN) injection 2 mg  2 mg Intravenous Q4H PRN Lucia Gaskins, MD   2 mg at 11/24/17 0858  . ondansetron (ZOFRAN) tablet 4 mg  4 mg Oral Q6H PRN Manuella Ghazi, Pratik D, DO       Or  . ondansetron (ZOFRAN) injection 4 mg  4  mg Intravenous Q6H PRN Manuella Ghazi, Pratik D, DO      . PHENobarbital (LUMINAL) tablet 97.2 mg  97.2 mg Oral QHS Shah, Pratik D, DO   97.2 mg at 11/23/17 2029  . QUEtiapine (SEROQUEL) tablet 50 mg  50 mg Oral QHS Phillips Odor, MD   50 mg at 11/23/17 2029  . valproic acid (DEPAKENE) 250 MG capsule 500 mg  500 mg Oral TID Manuella Ghazi, Pratik D, DO   500 mg at 11/24/17 3299     Discharge Medications: Please see discharge summary for a list of discharge medications.  Relevant Imaging Results:  Relevant Lab Results:   Additional Information SSN [place] [id number] [place], [place]

## 2017-11-24 NOTE — Progress Notes (Signed)
Called in report to nurse Suanne Marker at [facility] at [facility] [employer]

## 2017-11-24 NOTE — Discharge Summary (Signed)
Physician Discharge Summary  [name] ENI:[id number] DOB: October 04, 1945 DOA: 11/20/2017  PCP: Lucia Gaskins, MD  Admit date: 11/20/2017 Discharge date: 11/24/2017   Recommendations for Outpatient Follow-up:  Patient is discharged to skilled nursing facility.. Her new discharge medicine is valproic acid 500 mg p.o. 3 times daily.  And her aspirin was increased from 81 to 162 mg daily.  She has also been made a DNRDShe has also been made DO NOT RESUSCITATE on this hospital admission after consultation with family with family.  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  Was brought in with altered mental status with no metabolic abnormalities no evidence of metabolic abnormalities no evidence of hepatic encephalopathy electrolyte disturbancesl hepatic encephalopathy or  electroyte disturbances dementia  Or dementia abnormalities which are reversible she was seen in consultation by neurology her Keppra was discontinued and placed on valproic acid 500 mg p.o. 3 times daily her Keppra was discontinued and placed on valproic acid 500 mg by mouth 3 times a day.. A clinical suspicion was A clinical suspicion was non-clinical seizure disorder non-clinical seizure disorder.. EEG pending at the time of this discharge she likewise was [facility] likewise was made a DO NOT RESUSCITATE after consultation with familylt Ho a DO NOT  RESUSCITATE Spital Course:  See HPI Above   Procedures:  MRI & EEG  Consultations:  Neurology  Discharge Instructions  Discharge Instructions    Discharge instructions   Complete by:  As directed    Discharge patient   Complete by:  As directed    Discharge disposition:  03-Skilled [place]   Discharge patient date:  11/24/2017     Allergies as of 11/24/2017      Reactions   Codeine Nausea And Vomiting, Rash      Medication List    TAKE these medications   amitriptyline 10 MG tablet Commonly known as:  ELAVIL Take 20 mg by mouth at bedtime.   amLODipine 5 MG tablet Commonly known as:  NORVASC Take 1 tablet (5 mg total) by mouth daily.   aspirin 81 MG EC tablet Take 2 tablets (162 mg total) by mouth every evening. What changed:  how much to take   atorvastatin 40 MG tablet Commonly known as:  LIPITOR Take 40 mg by mouth every evening.   cholecalciferol 400 units Tabs tablet Commonly known as:  VITAMIN D Take 400 Units by mouth daily.   cloNIDine 0.1 MG tablet Commonly known as:  CATAPRES Take 1 tablet (0.1 mg total) by mouth 2 (two) times daily.   levothyroxine 25 MCG tablet Commonly known as:  SYNTHROID, LEVOTHROID Take 25 mcg by mouth daily before breakfast.   PHENobarbital 97.2 MG tablet Commonly known as:  LUMINAL Take 1 tablet (97.2 mg total) by mouth at bedtime.   QUEtiapine 50 MG tablet Commonly known as:  SEROQUEL Take 1 tablet (50 mg total) by mouth at bedtime.   valproic acid 250 MG capsule Commonly known as:  DEPAKENE Take 2 capsules (500 mg total) by mouth 3 (three) times daily.      Allergies  Allergen Reactions  . Codeine Nausea And Vomiting and Rash   Contact information for after-discharge care    [facility] .   Service:  Skilled Nursing Contact information: [address] [place] [phone]               The results of significant  diagnostics from this hospitalization (including imaging, microbiology, ancillary and laboratory) are listed below for reference.    Significant Diagnostic Studies: Dg Chest 2 View  Result Date: 11/20/2017 CLINICAL DATA:  Altered mental status. EXAM: CHEST - 2 VIEW COMPARISON:  Chest x-ray dated September 24, 2017. FINDINGS: The heart size and mediastinal contours are within normal limits. Normal pulmonary vascularity. Atherosclerotic calcification of the aortic arch. The lungs remain hyperinflated. No focal consolidation, pleural effusion, or pneumothorax. No acute osseous abnormality. IMPRESSION: COPD.  No active cardiopulmonary disease. Electronically Signed   By: Titus Dubin M.D.   On: 11/20/2017 16:38   Ct Head Wo Contrast  Result Date: 11/20/2017 CLINICAL DATA:  Altered mental status, aggression, stroke, seizures EXAM: CT HEAD WITHOUT CONTRAST TECHNIQUE: Contiguous axial images were obtained from the base of the skull through the vertex without intravenous contrast. Sagittal and coronal MPR images reconstructed from axial data set. COMPARISON:  11/15/2017 FINDINGS: Brain: Scattered motion artifacts degrade exam despite repeat imaging. Dilatation of the atrium and occipital horn of the RIGHT lateral ventricle. Remaining ventricular system normal appearance. No midline shift or mass effect. Old large posterior RIGHT parietal, small LEFT frontal, and small LEFT parietal vertex infarcts. Small vessel chronic ischemic changes of deep cerebral white matter. No intracranial hemorrhage, mass lesion, or evidence of acute infarction identified. No extra-axial fluid collections. Vascular: Atherosclerotic calcification of internal carotid and vertebrobasilar arteries at skull base. Skull: No gross acute osseous findings. Prior LEFT parietal craniotomy. Sinuses/Orbits: Grossly clear Other: N/A IMPRESSION: Atrophy with small vessel chronic ischemic changes of deep cerebral white matter. Old BILATERAL cortical  infarcts. No definite acute intracranial abnormalities identified on exam limited by motion artifacts. Electronically Signed   By: Lavonia Dana M.D.   On: 11/20/2017 16:54   Ct Head Wo Contrast  Result Date: 11/15/2017 CLINICAL DATA:  72 year old female with increasing confusion, right hand weakness. History of prior posterosuperior right hemisphere hemorrhage in 2005. History of prior craniotomy and meningioma resection. EXAM: CT HEAD WITHOUT CONTRAST TECHNIQUE: Contiguous axial images were obtained from the base of the skull through the vertex without intravenous contrast. COMPARISON:  Head CT 09/24/2017.  Brain MRI 10/30/2016 and earlier. FINDINGS: Brain: Chronic encephalomalacia at the left insula and operculum, bilateral posterosuperior frontal lobes, bilateral parietal lobes (greater on the right), and posterior body and splenium of the corpus callosum. Chronic ex vacuo ventricle enlargement, maximal at the right occipital horn. Stable gray-white matter differentiation throughout the brain. No midline shift, ventriculomegaly, mass effect, evidence of mass lesion, intracranial hemorrhage or evidence of cortically based acute infarction. Vascular: Calcified atherosclerosis at the skull base. No suspicious intracranial vascular hyperdensity. Skull: Chronic left lateral craniotomy. Stable visualized osseous structures. Sinuses/Orbits: Visualized paranasal sinuses are stable and well pneumatized. Mild inferior mastoid air cell opacification is chronic and not significantly changed. Other: No acute scalp soft tissue finding. Disconjugate gaze, otherwise negative orbits soft tissues. IMPRESSION: 1.  No acute intracranial abnormality. 2. Continued stable noncontrast CT appearance of the brain. Chronic encephalomalacia in  both hemispheres and the posterior corpus callosum. Electronically Signed   By: Genevie Ann M.D.   On: 11/15/2017 12:55   Mr Brain Wo Contrast  Result Date: 11/21/2017 CLINICAL DATA:  Altered  mental status. EXAM: MRI HEAD WITHOUT CONTRAST TECHNIQUE: Multiplanar, multiecho pulse sequences of the brain and surrounding structures were obtained without intravenous contrast. COMPARISON:  CT yesterday.  MRI 09/24/2017 FINDINGS: Brain: The patient would not tolerate complete scanning. Motion degraded diffusion imaging does not show any acute or subacute infarction. Chronic atrophy, encephalomalacia and gliosis in the parietal regions right more than left and in the left posterior frontal region. No sign of mass lesion, hemorrhage, obstructive hydrocephalus or extra-axial collection. Ex vacuo enlargement of the lateral ventricles right more than left. Vascular: Major vessels at the base of the brain show flow. Skull and upper cervical spine: Previous left frontoparietal craniotomy. Sinuses/Orbits: No sinus disease seen. Other: None detected. IMPRESSION: Abbreviated in motion degraded exam. No acute finding. Chronic insults as above. Electronically Signed   By: Nelson Chimes M.D.   On: 11/21/2017 09:31    Microbiology: Recent Results (from the past 240 hour(s))  Urine culture     Status: None   Collection Time: 11/20/17  6:10 PM  Result Value Ref Range Status   Specimen Description   Final    URINE, CATHETERIZED Performed at [facility], [address] [place], [place] [address]    Special Requests   Final    NONE Performed at [facility], [address], [place], [place] [address]    Culture   Final    NO GROWTH Performed at [facility], [place] [address], [place], [place] [address]    Report Status 11/22/2017 FINAL  Final     Labs: Basic Metabolic Panel: Recent Labs  Lab 11/20/17 1610 11/21/17 0617 11/22/17 1026 11/23/17 0630 11/24/17 0655  NA 136 135 136 136 136  K 4.5 3.7 4.4 4.0 3.4*  CL 100* 97* 97* 98* 104  CO2 22 26 23 23  19*  GLUCOSE 107* 91 95 85 88  BUN 11 14 21* 17 17  CREATININE 0.76 0.72 0.73 0.62 0.74  CALCIUM 9.7 9.4 9.6 9.2 8.3*   Liver Function  Tests: Recent Labs  Lab 11/20/17 1610 11/21/17 1507  AST 15 16  ALT 6* 9*  ALKPHOS 38 41  BILITOT 0.4 0.8  PROT 8.3* 7.7  ALBUMIN 4.0 3.7   No results for input(s): LIPASE, AMYLASE in the last 168 hours. Recent Labs  Lab 11/20/17 1520 11/21/17 1507  AMMONIA 23 26   CBC: Recent Labs  Lab 11/20/17 1610 11/21/17 0617 11/21/17 1507  WBC 13.8* 10.1 6.4  NEUTROABS 11.1*  --   --   HGB 12.6 11.4* 12.0  HCT 37.5 34.0* 35.2*  MCV 91.9 91.2 90.7  PLT 234 215 222   Cardiac Enzymes: Recent Labs  Lab 11/20/17 1610  TROPONINI <0.03   BNP: BNP (last 3 results) No results for input(s): BNP in the last 8760 hours.  ProBNP (last 3 results) No results for input(s): PROBNP in the last 8760 hours.  CBG: No results for input(s): GLUCAP in the last 168 hours.     Signed:  Maricela Curet   Pager: [phone] 11/24/2017, 9:09 AM

## 2017-11-24 NOTE — Progress Notes (Signed)
Patient will Discharge To: [facility] Anticipated DC Date: 11/24/17 Family Notified:yes, left voice message for Kirby Argueta, spouse [phone] Transport By: [facility]   Per MD patient ready for DC to [facility]. RN, patient, patient's family, and facility notified of DC. Assessment, Fl2/Pasrr, and Discharge Summary sent to facility. RN given number for report [phone]). DC packet on chart. Ambulance transport requested for patient.   CSW signing off.  Reed Breech LCSWA [phone]

## 2017-11-26 ENCOUNTER — Non-Acute Institutional Stay (SKILLED_NURSING_FACILITY): Payer: 59 | Admitting: Internal Medicine

## 2017-11-26 ENCOUNTER — Encounter (HOSPITAL_COMMUNITY)
Admission: RE | Admit: 2017-11-26 | Discharge: 2017-11-26 | Disposition: A | Discharge status: Home / Self Care | Payer: Medicare HMO | Source: Skilled Nursing Facility | Attending: Internal Medicine | Admitting: Internal Medicine

## 2017-11-26 ENCOUNTER — Encounter: Payer: Self-pay | Admitting: Internal Medicine

## 2017-11-26 DIAGNOSIS — G9340 Encephalopathy, unspecified: Secondary | ICD-10-CM | POA: Diagnosis not present

## 2017-11-26 DIAGNOSIS — I10 Essential (primary) hypertension: Secondary | ICD-10-CM

## 2017-11-26 DIAGNOSIS — G459 Transient cerebral ischemic attack, unspecified: Secondary | ICD-10-CM | POA: Insufficient documentation

## 2017-11-26 DIAGNOSIS — E032 Hypothyroidism due to medicaments and other exogenous substances: Secondary | ICD-10-CM | POA: Diagnosis not present

## 2017-11-26 DIAGNOSIS — G40901 Epilepsy, unspecified, not intractable, with status epilepticus: Secondary | ICD-10-CM

## 2017-11-26 LAB — BASIC METABOLIC PANEL
Anion gap: 12 (ref 5–15)
BUN: 23 mg/dL — ABNORMAL HIGH (ref 6–20)
CHLORIDE: 105 mmol/L (ref 101–111)
CO2: 23 mmol/L (ref 22–32)
CREATININE: 0.71 mg/dL (ref 0.44–1.00)
Calcium: 9.5 mg/dL (ref 8.9–10.3)
GFR calc Af Amer: >60 mL/min (ref >60)
GFR calc non Af Amer: >60 mL/min (ref >60)
GLUCOSE: 90 mg/dL (ref 65–99)
POTASSIUM: 4 mmol/L (ref 3.5–5.1)
Sodium: 140 mmol/L (ref 135–145)

## 2017-11-26 LAB — CBC
HCT: 40.4 % (ref 36.0–46.0)
HEMOGLOBIN: 13.5 g/dL (ref 12.0–15.0)
MCH: 30.7 pg (ref 26.0–34.0)
MCHC: 33.4 g/dL (ref 30.0–36.0)
MCV: 91.8 fL (ref 78.0–100.0)
Platelets: 155 10*3/uL (ref 150–400)
RBC: 4.4 MIL/uL (ref 3.87–5.11)
RDW: 15.9 % — ABNORMAL HIGH (ref 11.5–15.5)
WBC: 5 10*3/uL (ref 4.0–10.5)

## 2017-11-26 NOTE — Progress Notes (Signed)
Provider: Veleta Miners  Location:   [facility] Room Number: 103/P Place of Service:  SNF (31)  PCP: Lucia Gaskins, MD Patient Care Team: Lucia Gaskins, MD as PCP - General (Internal Medicine) Lucia Gaskins, MD (Internal Medicine) Gala Romney, Cristopher Estimable, MD as Consulting Physician (Gastroenterology)  Extended Emergency Contact Information Primary Emergency Contact: Demaris Callander [place] Phone: [phone] Relation: Spouse Secondary Emergency Contact: Slade,Barbara Address: [address]          [place] [address] [place] Phone: [phone] Mobile Phone: [phone] Relation: Sister  Code Status: DNR Goals of Care: Advanced Directive information Advanced Directives 11/26/2017  Does Patient Have a Medical Advance Directive? Yes  Type of Advance Directive Out of facility DNR (pink MOST or yellow form)  Does patient want to make changes to medical advance directive? No - Patient declined  Copy of [place] in Chart? -  Would patient like information on creating a medical advance directive? No - Patient declined      Chief Complaint  Patient presents with  . New Admit To SNF    New Admission Visit    HPI: Patient is a 72 y.o. female seen today for admission to SNF for therapy after staying in the hospital from 04/16-04/20 for Acute Mental status changes.  Patient has h/o Encephalomalacia, Seizure disorder, Hypertension, Hyperlipidemia, GI bleed ,Meningioma s/p Excision in 2016.  Patient was brought to the emergency department by her husband with some combativeness and weakness.  Her repeat MRI of head was negative for any acute changes.  She was seen by neurology they suspect it a combination of medicines and possible nonconvulsive seizures.  Her EEG was negative. Patient is now discharged to SNF for therapy.  Last night she was very aggressive and received 0.5 mg of Ativan. This morning  she has been sleepy.  Not following much commands.  Her husband was in the room who was able to provide me with some history. Patient lives with her husband.  Her sister lives close by who helps her with her medicines.     Past Medical History:  Diagnosis Date  . Aphasia   . GI bleed 12/01/2016  . Hypertension   . Seizures ([place])   . Stroke [facility]) 2009, 2015 9/27, 11/2014  . Thyroid disease    Past Surgical History:  Procedure Laterality Date  . ABDOMINAL HYSTERECTOMY    . COLONOSCOPY N/A 12/03/2016   Procedure: COLONOSCOPY;  Surgeon: Daneil Dolin, MD;  Location: [facility];  Service: Endoscopy;  Laterality: N/A;  . CRANIOTOMY Left 06/18/2015   Procedure: CRANIOTOMY TUMOR EXCISION w/Curve;  Surgeon: Consuella Lose, MD;  Location: [facility];  Service: Neurosurgery;  Laterality: Left;  stereotactic Left Craniotomy for resection of meningioma  . PEG TUBE PLACEMENT    . PEG TUBE REMOVAL      reports that she has been smoking cigarettes.  She has a 75.00 pack-year smoking history. She has never used smokeless tobacco. She reports that she does not drink alcohol or use drugs. Social History   Socioeconomic History  . Marital status: Married    Spouse name: Not on file  . Number of children: Not on file  . Years of education: Not on file  . Highest education level: Not on file  Occupational History  . Not on file  Social Needs  . Financial resource strain: Not on file  . Food insecurity:    Worry: Not  on file    Inability: Not on file  . Transportation needs:    Medical: Not on file    Non-medical: Not on file  Tobacco Use  . Smoking status: Current Every Day Smoker    Packs/day: 1.50    Years: 50.00    Pack years: 75.00    Types: Cigarettes  . Smokeless tobacco: Never Used  Substance and Sexual Activity  . Alcohol use: No  . Drug use: No  . Sexual activity: Not on file  Lifestyle  . Physical activity:    Days per week: Not on file    Minutes per session: Not  on file  . Stress: Not on file  Relationships  . Social connections:    Talks on phone: Not on file    Gets together: Not on file    Attends religious service: Not on file    Active member of club or organization: Not on file    Attends meetings of clubs or organizations: Not on file    Relationship status: Not on file  . Intimate partner violence:    Fear of current or ex partner: Not on file    Emotionally abused: Not on file    Physically abused: Not on file    Forced sexual activity: Not on file  Other Topics Concern  . Not on file  Social History Narrative  . Not on file    Functional Status Survey:    Family History  Problem Relation Age of Onset  . Coronary artery disease Unknown        Stents   . Stroke Father        Hemorrhagic   . Sudden death Father   . Ulcers Mother        Bleeding  . Colon cancer Sister     Health Maintenance  Topic Date Due  . MAMMOGRAM  12/26/2017 (Originally 02/09/1998)  . DEXA SCAN  12/26/2017 (Originally 02/09/2013)  . TETANUS/TDAP  12/26/2017 (Originally 02/10/1965)  . Hepatitis C Screening  12/26/2017 (Originally 1945-09-19)  . PNA vac Low Risk Adult (1 of 2 - PCV13) 12/26/2017 (Originally 02/09/2013)  . INFLUENZA VACCINE  03/07/2018  . COLONOSCOPY  12/04/2026    Allergies  Allergen Reactions  . Codeine Nausea And Vomiting and Rash    Allergies as of 11/26/2017      Reactions   Codeine Nausea And Vomiting, Rash      Medication List        Accurate as of 11/26/17  9:08 AM. Always use your most recent med list.          acetaminophen 325 MG tablet Commonly known as:  TYLENOL Take 650 mg by mouth every 6 (six) hours as needed.   amitriptyline 10 MG tablet Commonly known as:  ELAVIL Take 20 mg by mouth at bedtime.   amLODipine 5 MG tablet Commonly known as:  NORVASC Take 1 tablet (5 mg total) by mouth daily.   aspirin 81 MG EC tablet Take 2 tablets (162 mg total) by mouth every evening.   atorvastatin 40 MG  tablet Commonly known as:  LIPITOR Take 40 mg by mouth every evening.   cholecalciferol 400 units Tabs tablet Commonly known as:  VITAMIN D Take 400 Units by mouth daily.   cloNIDine 0.1 MG tablet Commonly known as:  CATAPRES Take 1 tablet (0.1 mg total) by mouth 2 (two) times daily.   docusate 50 MG/5ML liquid Commonly known as:  COLACE Take 100 mg by  mouth daily.   levothyroxine 25 MCG tablet Commonly known as:  SYNTHROID, LEVOTHROID Take 25 mcg by mouth daily before breakfast.   LORazepam 0.5 MG tablet Commonly known as:  ATIVAN Take 0.5 mg by mouth every 6 (six) hours as needed for anxiety.   PHENobarbital 97.2 MG tablet Commonly known as:  LUMINAL Take 1 tablet (97.2 mg total) by mouth at bedtime.   QUEtiapine 50 MG tablet Commonly known as:  SEROQUEL Take 1 tablet (50 mg total) by mouth at bedtime.   valproic acid 250 MG capsule Commonly known as:  DEPAKENE Take 2 capsules (500 mg total) by mouth 3 (three) times daily.       Review of Systems  Unable to perform ROS: Other    Vitals:   11/26/17 0849  BP: (!) 160/87  Pulse: 82  Resp: (!) 21  Temp: 97.8 F (36.6 C)  TempSrc: Oral  SpO2: 94%   There is no height or weight on file to calculate BMI. Physical Exam  Constitutional: She appears well-developed. She appears cachectic.  HENT:  Head: Normocephalic.  Thrush in her Mouth.   Eyes: Pupils are equal, round, and reactive to light.  Neck: Neck supple.  Cardiovascular: Normal rate and regular rhythm.  No murmur heard. Pulmonary/Chest: Effort normal and breath sounds normal. No stridor. No respiratory distress. She has no wheezes.  Abdominal: Soft. Bowel sounds are normal. She exhibits no distension. There is no tenderness. There is no guarding.  Musculoskeletal: She exhibits no edema.  Lymphadenopathy:    She has no cervical adenopathy.  Neurological: She is alert.  Will follow some Commands. Left hemiparesis. Right had Good strength. Would not  answer orientation question.  Skin: Skin is warm and dry.  Psychiatric: She has a normal mood and affect. Her behavior is normal.    Labs reviewed: Basic Metabolic Panel: Recent Labs    11/23/17 0630 11/24/17 0655 11/26/17 0300  NA 136 136 140  K 4.0 3.4* 4.0  CL 98* 104 105  CO2 23 19* 23  GLUCOSE 85 88 90  BUN 17 17 23*  CREATININE 0.62 0.74 0.71  CALCIUM 9.2 8.3* 9.5   Liver Function Tests: Recent Labs    11/15/17 1449 11/20/17 1610 11/21/17 1507  AST 14* 15 16  ALT 10* 6* 9*  ALKPHOS 43 38 41  BILITOT 0.3 0.4 0.8  PROT 7.7 8.3* 7.7  ALBUMIN 3.7 4.0 3.7   No results for input(s): LIPASE, AMYLASE in the last 8760 hours. Recent Labs    09/24/17 1023 11/20/17 1520 11/21/17 1507  AMMONIA 27 23 26    CBC: Recent Labs    10/01/17 0300 11/15/17 1449  11/20/17 1610 11/21/17 0617 11/21/17 1507 11/26/17 0300  WBC 4.3 7.5  --  13.8* 10.1 6.4 5.0  NEUTROABS 1.4* 4.7  --  11.1*  --   --   --   HGB 12.0 12.0   < > 12.6 11.4* 12.0 13.5  HCT 36.6 35.8*   < > 37.5 34.0* 35.2* 40.4  MCV 92.2 90.4  --  91.9 91.2 90.7 91.8  PLT 227 302  --  234 215 222 155   < > = values in this interval not displayed.   Cardiac Enzymes: Recent Labs    11/20/17 1610  TROPONINI <0.03   BNP: Invalid input(s): POCBNP Lab Results  Component Value Date   HGBA1C 6.1 (H) 09/25/2017   Lab Results  Component Value Date   TSH 2.550 11/21/2017   Lab Results  Component Value Date   VITAMINB12 450 11/21/2017   Lab Results  Component Value Date   FOLATE 23.6 11/21/2017   Lab Results  Component Value Date   IRON 52 12/04/2016   TIBC 265 12/04/2016   FERRITIN 36 12/04/2016    Imaging and Procedures obtained prior to SNF admission: No results found.  Assessment/Plan  Acute encephalopathy Patient has been very sleepy this morning.  It can be because of the Ativan that she received We will decrease the dose to .25 mg po Q 6 For agitation. PRN Also decrease her  Seroquel. Per neurology taper her off her amitriptyline Will decrease the dose to 10 mg for 2 weeks and then stop.  Seizure disorder, nonconvulsive,  She is on phenobarbital and Depakote By neurology she is supposed to be on Vimpat but she was not discharged on that. Follow-up with neurology office  Essential hypertension Neurology has also suggested to the DC her clonidine We will change it to 0.1 mg daily We will increase her amlodipine to 7.5 mg  Hypothyroidism TSH normal in the hospital 04/19 Continue same dose Hyperlipidemia history of stroke Continue on aspirin 162 mg and Lipitor Oral thrush We will start her on nystatin Disposition Discussed with the husband in the room he states if she does not improve he is planning to consider hospice.   Family/ staff Communication:   Labs/tests ordered:BMP and CBC  Total time spent in this patient care encounter was 45_ minutes; greater than 50% of the visit spent counseling patient, reviewing records , Labs and coordinating care for problems addressed at this encounter.

## 2017-11-27 ENCOUNTER — Other Ambulatory Visit: Payer: Self-pay

## 2017-11-27 MED ORDER — PHENOBARBITAL 97.2 MG PO TABS: 97.2000 mg | ORAL_TABLET | Freq: Every day | ORAL | 0 refills | Status: AC

## 2017-11-27 NOTE — Telephone Encounter (Signed)
RX Fax for [facility]@ [phone]  

## 2017-12-03 ENCOUNTER — Encounter (HOSPITAL_COMMUNITY)
Admission: RE | Admit: 2017-12-03 | Discharge: 2017-12-03 | Disposition: A | Discharge status: Home / Self Care | Payer: Medicare HMO | Source: Skilled Nursing Facility | Attending: *Deleted | Admitting: *Deleted

## 2017-12-03 LAB — BASIC METABOLIC PANEL
ANION GAP: 10 (ref 5–15)
BUN: 22 mg/dL — ABNORMAL HIGH (ref 6–20)
CALCIUM: 9.4 mg/dL (ref 8.9–10.3)
CO2: 28 mmol/L (ref 22–32)
Chloride: 105 mmol/L (ref 101–111)
Creatinine, Ser: 0.63 mg/dL (ref 0.44–1.00)
Glucose, Bld: 99 mg/dL (ref 65–99)
POTASSIUM: 4.6 mmol/L (ref 3.5–5.1)
Sodium: 143 mmol/L (ref 135–145)

## 2017-12-03 LAB — CBC
HEMATOCRIT: 35.5 % — AB (ref 36.0–46.0)
Hemoglobin: 11.5 g/dL — ABNORMAL LOW (ref 12.0–15.0)
MCH: 30.3 pg (ref 26.0–34.0)
MCHC: 32.4 g/dL (ref 30.0–36.0)
MCV: 93.4 fL (ref 78.0–100.0)
Platelets: 207 10*3/uL (ref 150–400)
RBC: 3.8 MIL/uL — AB (ref 3.87–5.11)
RDW: 16.4 % — AB (ref 11.5–15.5)
WBC: 5 10*3/uL (ref 4.0–10.5)

## 2017-12-07 ENCOUNTER — Encounter: Payer: Self-pay | Admitting: Internal Medicine

## 2017-12-07 ENCOUNTER — Non-Acute Institutional Stay (SKILLED_NURSING_FACILITY): Payer: 59 | Admitting: Internal Medicine

## 2017-12-07 DIAGNOSIS — G9340 Encephalopathy, unspecified: Secondary | ICD-10-CM | POA: Diagnosis not present

## 2017-12-07 DIAGNOSIS — D62 Acute posthemorrhagic anemia: Secondary | ICD-10-CM

## 2017-12-07 DIAGNOSIS — Z8673 Personal history of transient ischemic attack (TIA), and cerebral infarction without residual deficits: Secondary | ICD-10-CM | POA: Diagnosis not present

## 2017-12-07 DIAGNOSIS — G40901 Epilepsy, unspecified, not intractable, with status epilepticus: Secondary | ICD-10-CM | POA: Diagnosis not present

## 2017-12-07 DIAGNOSIS — I10 Essential (primary) hypertension: Secondary | ICD-10-CM | POA: Diagnosis not present

## 2017-12-07 NOTE — Progress Notes (Signed)
Location:   [facility] Room Number: 103/P Place of Service:  SNF (31)  Provider: Granville Lewis  PCP: Lucia Gaskins, MD Patient Care Team: Lucia Gaskins, MD as PCP - General (Internal Medicine) Lucia Gaskins, MD (Internal Medicine) Gala Romney Cristopher Estimable, MD as Consulting Physician (Gastroenterology)  Extended Emergency Contact Information Primary Emergency Contact: [facility] [place] Phone: [phone] Relation: Spouse Secondary Emergency Contact: Slade,Barbara Address: [address]          [place] [address] Johnnette Litter of [place] Phone: [phone] Mobile Phone: [phone] Relation: Sister  Code Status: DNR Goals of care:  Advanced Directive information Advanced Directives 12/07/2017  Does Patient Have a Medical Advance Directive? Yes  Type of Advance Directive Out of facility DNR (pink MOST or yellow form)  Does patient want to make changes to medical advance directive? No - Patient declined  Copy of [place] in Chart? -  Would patient like information on creating a medical advance directive? No - Patient declined     Allergies  Allergen Reactions  . Codeine Nausea And Vomiting and Rash    Chief Complaint  Patient presents with  . Discharge Note    Discharge Visit    HPI:  72 y.o. female seen today for discharge from facility early next week.     ipatient was here for rehab after hospitalization for acute mental status changes She has a history of Encephalomalacia as well as seizure disorder hypertension hyperlipidemia GI bleeding and meningioma  status post excision in 2016  She initially presented to the ER with combativeness and weakness-MRI of the head was negative for any acute changes she was seen by neurology and they suspected a combination of medicines and possible nonconvulsive seizure-EEG was negative.  She was discharged to skilled nursing for therapy.  During her  stay here initially she was lethargic after receiving Ativan and her dose was reduced she is now on 0.25 every 6 hours as needed and appears to have tolerated this well.-- she is on Seroquel as well at night  Her behaviors have improved she is bright alert and pleasant and cooperative-she is quite adamant however that she would like to go home as soon as possible.  She will be going home with her husband-she also has a sister who lives close by and helps her with her medicines.  In regards to seizure disorder she is on phenobarbital and Depakote there have been no seizures noted during her stay here.  In regards to hypertension she is on Norvasc as well as clonidine blood pressure is mildly elevated at times 150/90 today see previous readings 126/80-102/76 appears there is variability here.     Past Medical History:  Diagnosis Date  . Aphasia   . GI bleed 12/01/2016  . Hypertension   . Seizures ([place])   . Stroke [facility]) 2009, 2015 9/27, 11/2014  . Thyroid disease     Past Surgical History:  Procedure Laterality Date  . ABDOMINAL HYSTERECTOMY    . COLONOSCOPY N/A 12/03/2016   Procedure: COLONOSCOPY;  Surgeon: Daneil Dolin, MD;  Location: [facility];  Service: Endoscopy;  Laterality: N/A;  . CRANIOTOMY Left 06/18/2015   Procedure: CRANIOTOMY TUMOR EXCISION w/Curve;  Surgeon: Consuella Lose, MD;  Location: [facility] NEURO ORS;  Service: Neurosurgery;  Laterality: Left;  stereotactic Left Craniotomy for resection of meningioma  . PEG TUBE PLACEMENT    . PEG TUBE REMOVAL  reports that she has been smoking cigarettes.  She has a 75.00 pack-year smoking history. She has never used smokeless tobacco. She reports that she does not drink alcohol or use drugs. Social History   Socioeconomic History  . Marital status: Married    Spouse name: Not on file  . Number of children: Not on file  . Years of education: Not on file  . Highest education level: Not on file  Occupational History   . Not on file  Social Needs  . Financial resource strain: Not on file  . Food insecurity:    Worry: Not on file    Inability: Not on file  . Transportation needs:    Medical: Not on file    Non-medical: Not on file  Tobacco Use  . Smoking status: Current Every Day Smoker    Packs/day: 1.50    Years: 50.00    Pack years: 75.00    Types: Cigarettes  . Smokeless tobacco: Never Used  Substance and Sexual Activity  . Alcohol use: No  . Drug use: No  . Sexual activity: Not on file  Lifestyle  . Physical activity:    Days per week: Not on file    Minutes per session: Not on file  . Stress: Not on file  Relationships  . Social connections:    Talks on phone: Not on file    Gets together: Not on file    Attends religious service: Not on file    Active member of club or organization: Not on file    Attends meetings of clubs or organizations: Not on file    Relationship status: Not on file  . Intimate partner violence:    Fear of current or ex partner: Not on file    Emotionally abused: Not on file    Physically abused: Not on file    Forced sexual activity: Not on file  Other Topics Concern  . Not on file  Social History Narrative  . Not on file   Functional Status Survey:    Allergies  Allergen Reactions  . Codeine Nausea And Vomiting and Rash    Pertinent  Health Maintenance Due  Topic Date Due  . MAMMOGRAM  12/26/2017 (Originally 02/09/1998)  . DEXA SCAN  12/26/2017 (Originally 02/09/2013)  . PNA vac Low Risk Adult (1 of 2 - PCV13) 12/26/2017 (Originally 02/09/2013)  . INFLUENZA VACCINE  03/07/2018  . COLONOSCOPY  12/04/2026    Medications: Outpatient Encounter Medications as of 12/07/2017  Medication Sig  . acetaminophen (TYLENOL) 325 MG tablet Take 650 mg by mouth every 6 (six) hours as needed.  Marland Kitchen amitriptyline (ELAVIL) 10 MG tablet Take 20 mg by mouth at bedtime.  Marland Kitchen amLODipine (NORVASC) 2.5 MG tablet Take 2.5 mg along with 5 mg to = 7.5 mg once a day  .  amLODipine (NORVASC) 5 MG tablet Take 5 mg along with 2.5 mg to = 7.5 mg once a day  . aspirin EC 81 MG EC tablet Take 2 tablets (162 mg total) by mouth every evening.  Marland Kitchen atorvastatin (LIPITOR) 40 MG tablet Take 40 mg by mouth every evening.   Roseanne Kaufman [place]-Castor Oil (VENELEX) OINT Apply to sacrum and bilateral buttocks q shift for prevention and incontinence or erythema every shift  . cholecalciferol (VITAMIN D) 400 units TABS tablet Take 400 Units by mouth daily.  . cloNIDine (CATAPRES) 0.1 MG tablet Take 0.1 mg by mouth daily.  Marland Kitchen docusate (COLACE) 50 MG/5ML liquid Take 100 mg  by mouth daily.  Marland Kitchen levothyroxine (SYNTHROID, LEVOTHROID) 25 MCG tablet Take 25 mcg by mouth daily before breakfast.  . LORazepam (ATIVAN) 0.5 MG tablet Take 0.25 mg by mouth every 6 (six) hours as needed for anxiety.   Marland Kitchen PHENobarbital (LUMINAL) 97.2 MG tablet Take 1 tablet (97.2 mg total) by mouth at bedtime.  Marland Kitchen QUEtiapine (SEROQUEL) 25 MG tablet Take 25 mg by mouth at bedtime.  . valproic acid (DEPAKENE) 250 MG capsule Take 2 capsules (500 mg total) by mouth 3 (three) times daily.  . [DISCONTINUED] amLODipine (NORVASC) 5 MG tablet Take 1 tablet (5 mg total) by mouth daily. (Patient taking differently: Take 5 mg by mouth daily. Along with 2.5 mg to = 7.5)  . [DISCONTINUED] cloNIDine (CATAPRES) 0.1 MG tablet Take 1 tablet (0.1 mg total) by mouth 2 (two) times daily. (Patient taking differently: Take 0.1 mg by mouth daily. )  . [DISCONTINUED] QUEtiapine (SEROQUEL) 50 MG tablet Take 1 tablet (50 mg total) by mouth at bedtime. (Patient taking differently: Take 25 mg by mouth at bedtime. )   No facility-administered encounter medications on file as of 12/07/2017.      Review of Systems   I this is limited secondary to dementia provided by nursing as well.  In general no complaints of fever chills she has gained some weight which is desirable.  Head ears eyes nose mouth and throat does not complain of visual changes or  sore throat.  Skin is not complain of rashes or itching.  Respiratory does not complain of shortness of breath or cough.  Cardiac denies chest pain.  Does not appear to have significant lower extremity edema.  GI does not complain of abdominal discomfort nausea vomiting constipation.  GU is not complaining of dysuria.  Musculoskeletal does not complain of joint pain appears to have gained strength during her stay here does have a history of some left-sided weakness.  Neurologic does have a history of left-sided weakness with history of CVA but this appears to be fairly mild she is actually able to get up and walk although she is very unsteady.  She is not complaining of dizziness or headache.  And psych does have some history it appears of dementia with encephalopathy but this has improved during her stay here- she does not appear to be overtly depressed or anxious but does want to go home.     Vitals:   12/07/17 1144  BP: (!) 159/77  Pulse: 89  Resp: 20  Temp: 98.2 F (36.8 C)  TempSrc: Oral  Blood pressures as noted aboveManual blood pressure was 150/90 there are variable Weight is 111.8 pounds again of about 3 pounds during her stay here Physical Exam   In general this is a pleasant somewhat confused elderly female in no distress.  Her skin is warm and dry.  Oropharynx  mucous membranes moist.  Chest is clear to auscultation-there is no labored breathing.  Heart is regular rate and rhythm without murmur gallop or rub she does not have significant lower extremity edema.  Abdomen is soft nontender with positive bowel sounds.  Musculoskeletal is able to move all extremities x4 with some left-sided weakness which appears to be baseline.  Largely ambulates in a wheelchair I have seen herget up and walk but this--is quite unsteady  Neurologic as noted above her speech is clear does have left-sided weakness at baseline cranial nerves appear to be grossly intact-she is  alert.  Psych she does have some confusion but is  pleasant and cooperative.--- Somewhat fixated on wanting to go home    Labs reviewed: Basic Metabolic Panel: Recent Labs    11/24/17 0655 11/26/17 0300 12/03/17 0632  NA 136 140 143  K 3.4* 4.0 4.6  CL 104 105 105  CO2 19* 23 28  GLUCOSE 88 90 99  BUN 17 23* 22*  CREATININE 0.74 0.71 0.63  CALCIUM 8.3* 9.5 9.4   Liver Function Tests: Recent Labs    11/15/17 1449 11/20/17 1610 11/21/17 1507  AST 14* 15 16  ALT 10* 6* 9*  ALKPHOS 43 38 41  BILITOT 0.3 0.4 0.8  PROT 7.7 8.3* 7.7  ALBUMIN 3.7 4.0 3.7   No results for input(s): LIPASE, AMYLASE in the last 8760 hours. Recent Labs    09/24/17 1023 11/20/17 1520 11/21/17 1507  AMMONIA 27 23 26    CBC: Recent Labs    10/01/17 0300 11/15/17 1449  11/20/17 1610  11/21/17 1507 11/26/17 0300 12/03/17 0632  WBC 4.3 7.5  --  13.8*   < > 6.4 5.0 5.0  NEUTROABS 1.4* 4.7  --  11.1*  --   --   --   --   HGB 12.0 12.0   < > 12.6   < > 12.0 13.5 11.5*  HCT 36.6 35.8*   < > 37.5   < > 35.2* 40.4 35.5*  MCV 92.2 90.4  --  91.9   < > 90.7 91.8 93.4  PLT 227 302  --  234   < > 222 155 207   < > = values in this interval not displayed.   Cardiac Enzymes: Recent Labs    11/20/17 1610  TROPONINI <0.03   BNP: Invalid input(s): POCBNP CBG: Recent Labs    09/24/17 1027  GLUCAP 111*    Procedures and Imaging Studies During Stay: Dg Chest 2 View  Result Date: 11/20/2017 CLINICAL DATA:  Altered mental status. EXAM: CHEST - 2 VIEW COMPARISON:  Chest x-ray dated September 24, 2017. FINDINGS: The heart size and mediastinal contours are within normal limits. Normal pulmonary vascularity. Atherosclerotic calcification of the aortic arch. The lungs remain hyperinflated. No focal consolidation, pleural effusion, or pneumothorax. No acute osseous abnormality. IMPRESSION: COPD.  No active cardiopulmonary disease. Electronically Signed   By: Titus Dubin M.D.   On: 11/20/2017 16:38     Ct Head Wo Contrast  Result Date: 11/20/2017 CLINICAL DATA:  Altered mental status, aggression, stroke, seizures EXAM: CT HEAD WITHOUT CONTRAST TECHNIQUE: Contiguous axial images were obtained from the base of the skull through the vertex without intravenous contrast. Sagittal and coronal MPR images reconstructed from axial data set. COMPARISON:  11/15/2017 FINDINGS: Brain: Scattered motion artifacts degrade exam despite repeat imaging. Dilatation of the atrium and occipital horn of the RIGHT lateral ventricle. Remaining ventricular system normal appearance. No midline shift or mass effect. Old large posterior RIGHT parietal, small LEFT frontal, and small LEFT parietal vertex infarcts. Small vessel chronic ischemic changes of deep cerebral white matter. No intracranial hemorrhage, mass lesion, or evidence of acute infarction identified. No extra-axial fluid collections. Vascular: Atherosclerotic calcification of internal carotid and vertebrobasilar arteries at skull base. Skull: No gross acute osseous findings. Prior LEFT parietal craniotomy. Sinuses/Orbits: Grossly clear Other: N/A IMPRESSION: Atrophy with small vessel chronic ischemic changes of deep cerebral white matter. Old BILATERAL cortical infarcts. No definite acute intracranial abnormalities identified on exam limited by motion artifacts. Electronically Signed   By: Lavonia Dana M.D.   On: 11/20/2017 16:54   Ct Head  Wo Contrast  Result Date: 11/15/2017 CLINICAL DATA:  72 year old female with increasing confusion, right hand weakness. History of prior posterosuperior right hemisphere hemorrhage in 2005. History of prior craniotomy and meningioma resection. EXAM: CT HEAD WITHOUT CONTRAST TECHNIQUE: Contiguous axial images were obtained from the base of the skull through the vertex without intravenous contrast. COMPARISON:  Head CT 09/24/2017.  Brain MRI 10/30/2016 and earlier. FINDINGS: Brain: Chronic encephalomalacia at the left insula and  operculum, bilateral posterosuperior frontal lobes, bilateral parietal lobes (greater on the right), and posterior body and splenium of the corpus callosum. Chronic ex vacuo ventricle enlargement, maximal at the right occipital horn. Stable gray-white matter differentiation throughout the brain. No midline shift, ventriculomegaly, mass effect, evidence of mass lesion, intracranial hemorrhage or evidence of cortically based acute infarction. Vascular: Calcified atherosclerosis at the skull base. No suspicious intracranial vascular hyperdensity. Skull: Chronic left lateral craniotomy. Stable visualized osseous structures. Sinuses/Orbits: Visualized paranasal sinuses are stable and well pneumatized. Mild inferior mastoid air cell opacification is chronic and not significantly changed. Other: No acute scalp soft tissue finding. Disconjugate gaze, otherwise negative orbits soft tissues. IMPRESSION: 1.  No acute intracranial abnormality. 2. Continued stable noncontrast CT appearance of the brain. Chronic encephalomalacia in both hemispheres and the posterior corpus callosum. Electronically Signed   By: Genevie Ann M.D.   On: 11/15/2017 12:55   Mr Brain Wo Contrast  Result Date: 11/21/2017 CLINICAL DATA:  Altered mental status. EXAM: MRI HEAD WITHOUT CONTRAST TECHNIQUE: Multiplanar, multiecho pulse sequences of the brain and surrounding structures were obtained without intravenous contrast. COMPARISON:  CT yesterday.  MRI 09/24/2017 FINDINGS: Brain: The patient would not tolerate complete scanning. Motion degraded diffusion imaging does not show any acute or subacute infarction. Chronic atrophy, encephalomalacia and gliosis in the parietal regions right more than left and in the left posterior frontal region. No sign of mass lesion, hemorrhage, obstructive hydrocephalus or extra-axial collection. Ex vacuo enlargement of the lateral ventricles right more than left. Vascular: Major vessels at the base of the brain show flow.  Skull and upper cervical spine: Previous left frontoparietal craniotomy. Sinuses/Orbits: No sinus disease seen. Other: None detected. IMPRESSION: Abbreviated in motion degraded exam. No acute finding. Chronic insults as above. Electronically Signed   By: Nelson Chimes M.D.   On: 11/21/2017 09:31    Assessment/Plan:   --1 history of encephalopathy with agitation -this appears to have improved she is on Seroquel at night as well as Ativan as needed during the day 0.25mg  every 6 hours as needed -- apparently there were no further episodes of sedation.  She will need continued home health support-she will be home with her husband and does have a very supportive sister nearby.  2.  History of seizure disorder this is been stable on phenobarbital and Depakote will update levels before discharge.  3.  Hypertension again there is been variable blood pressures as noted above she is on Norvasc as well as clonidine 0.1 mg daily will defer any aggressive changes to primary care provider.  4.  Hyperlipidemia she continues on a statin since her stay here was quite short will defer follow-up to her primary care provider.  5.  History of GI bleed in the past last hemoglobin was 11.5 which is relatively stable will update this before discharge with follow-up by primary care provider.  6.  History of hypothyroidism she is on Synthroid TSH was within normal limits on April lab.  7.-  History of depression she is on amitriptyline this appears  to be quite stable.  8.  History of CVA this appears to be relatively baseline she is doing well with supportive care she is on a statin as well as aspirin.   again she will be going home with her husband she does have a supportive sister will need extensive home health support as well as well as therapy evaluation.  [id number]- of note greater than 30 minutes spent on this discharge summary-greater than 50% of time spent coordinating plan of care for numerous diagnoses

## 2017-12-08 ENCOUNTER — Encounter (HOSPITAL_COMMUNITY)
Admission: RE | Admit: 2017-12-08 | Discharge: 2017-12-08 | Disposition: A | Discharge status: Home / Self Care | Payer: Medicare HMO | Source: Skilled Nursing Facility | Attending: Internal Medicine | Admitting: Internal Medicine

## 2017-12-08 ENCOUNTER — Encounter: Payer: Self-pay | Admitting: Internal Medicine

## 2017-12-08 DIAGNOSIS — G459 Transient cerebral ischemic attack, unspecified: Secondary | ICD-10-CM | POA: Insufficient documentation

## 2017-12-08 LAB — BASIC METABOLIC PANEL
ANION GAP: 11 (ref 5–15)
BUN: 17 mg/dL (ref 6–20)
CALCIUM: 9.1 mg/dL (ref 8.9–10.3)
CO2: 29 mmol/L (ref 22–32)
Chloride: 103 mmol/L (ref 101–111)
Creatinine, Ser: 0.7 mg/dL (ref 0.44–1.00)
GFR calc Af Amer: >60 mL/min (ref >60)
Glucose, Bld: 106 mg/dL — ABNORMAL HIGH (ref 65–99)
Potassium: 4.8 mmol/L (ref 3.5–5.1)
Sodium: 143 mmol/L (ref 135–145)

## 2017-12-08 LAB — CBC WITH DIFFERENTIAL/PLATELET
BASOS ABS: 0 10*3/uL (ref 0.0–0.1)
Basophils Relative: 0 %
EOS ABS: 0.1 10*3/uL (ref 0.0–0.7)
Eosinophils Relative: 1 %
HCT: 31.9 % — ABNORMAL LOW (ref 36.0–46.0)
Hemoglobin: 10.7 g/dL — ABNORMAL LOW (ref 12.0–15.0)
LYMPHS ABS: 2.2 10*3/uL (ref 0.7–4.0)
LYMPHS PCT: 28 %
MCH: 31.6 pg (ref 26.0–34.0)
MCHC: 33.5 g/dL (ref 30.0–36.0)
MCV: 94.1 fL (ref 78.0–100.0)
Monocytes Absolute: 0.9 10*3/uL (ref 0.1–1.0)
Monocytes Relative: 11 %
Neutro Abs: 4.8 10*3/uL (ref 1.7–7.7)
Neutrophils Relative %: 60 %
Platelets: 249 10*3/uL (ref 150–400)
RBC: 3.39 MIL/uL — AB (ref 3.87–5.11)
RDW: 16.1 % — ABNORMAL HIGH (ref 11.5–15.5)
WBC: 8 10*3/uL (ref 4.0–10.5)

## 2017-12-08 LAB — PHENOBARBITAL LEVEL: Phenobarbital: 27.1 ug/mL (ref 15.0–30.0)

## 2017-12-08 LAB — VALPROIC ACID LEVEL: Valproic Acid Lvl: 88 ug/mL (ref 50.0–100.0)

## 2018-02-13 ENCOUNTER — Other Ambulatory Visit: Payer: Self-pay | Admitting: Neurology

## 2018-02-13 DIAGNOSIS — R2689 Other abnormalities of gait and mobility: Secondary | ICD-10-CM

## 2018-02-13 DIAGNOSIS — R4182 Altered mental status, unspecified: Secondary | ICD-10-CM

## 2018-03-16 ENCOUNTER — Encounter (HOSPITAL_COMMUNITY): Payer: Self-pay

## 2018-03-16 ENCOUNTER — Inpatient Hospital Stay (HOSPITAL_COMMUNITY): Payer: Medicare HMO

## 2018-03-16 ENCOUNTER — Inpatient Hospital Stay (HOSPITAL_COMMUNITY)
Admission: EM | Admit: 2018-03-16 | Discharge: 2018-04-07 | DRG: 100 | Disposition: E | Payer: Medicare HMO | Attending: Pulmonary Disease | Admitting: Pulmonary Disease

## 2018-03-16 ENCOUNTER — Emergency Department (HOSPITAL_COMMUNITY): Payer: Medicare HMO

## 2018-03-16 DIAGNOSIS — Z681 Body mass index (BMI) 19 or less, adult: Secondary | ICD-10-CM

## 2018-03-16 DIAGNOSIS — Z0189 Encounter for other specified special examinations: Secondary | ICD-10-CM

## 2018-03-16 DIAGNOSIS — R402112 Coma scale, eyes open, never, at arrival to emergency department: Secondary | ICD-10-CM | POA: Diagnosis not present

## 2018-03-16 DIAGNOSIS — G40101 Localization-related (focal) (partial) symptomatic epilepsy and epileptic syndromes with simple partial seizures, not intractable, with status epilepticus: Secondary | ICD-10-CM | POA: Diagnosis present

## 2018-03-16 DIAGNOSIS — Z66 Do not resuscitate: Secondary | ICD-10-CM | POA: Diagnosis not present

## 2018-03-16 DIAGNOSIS — T4275XA Adverse effect of unspecified antiepileptic and sedative-hypnotic drugs, initial encounter: Secondary | ICD-10-CM | POA: Diagnosis not present

## 2018-03-16 DIAGNOSIS — Z90710 Acquired absence of both cervix and uterus: Secondary | ICD-10-CM

## 2018-03-16 DIAGNOSIS — E039 Hypothyroidism, unspecified: Secondary | ICD-10-CM | POA: Diagnosis not present

## 2018-03-16 DIAGNOSIS — R402232 Coma scale, best verbal response, inappropriate words, at arrival to emergency department: Secondary | ICD-10-CM | POA: Diagnosis not present

## 2018-03-16 DIAGNOSIS — Z885 Allergy status to narcotic agent status: Secondary | ICD-10-CM | POA: Diagnosis not present

## 2018-03-16 DIAGNOSIS — F17210 Nicotine dependence, cigarettes, uncomplicated: Secondary | ICD-10-CM | POA: Diagnosis present

## 2018-03-16 DIAGNOSIS — I952 Hypotension due to drugs: Secondary | ICD-10-CM | POA: Diagnosis not present

## 2018-03-16 DIAGNOSIS — I69320 Aphasia following cerebral infarction: Secondary | ICD-10-CM | POA: Diagnosis not present

## 2018-03-16 DIAGNOSIS — I69354 Hemiplegia and hemiparesis following cerebral infarction affecting left non-dominant side: Secondary | ICD-10-CM | POA: Diagnosis not present

## 2018-03-16 DIAGNOSIS — Z800 Family history of malignant neoplasm of digestive organs: Secondary | ICD-10-CM

## 2018-03-16 DIAGNOSIS — J9601 Acute respiratory failure with hypoxia: Secondary | ICD-10-CM | POA: Diagnosis not present

## 2018-03-16 DIAGNOSIS — R739 Hyperglycemia, unspecified: Secondary | ICD-10-CM | POA: Diagnosis not present

## 2018-03-16 DIAGNOSIS — G40A01 Absence epileptic syndrome, not intractable, with status epilepticus: Secondary | ICD-10-CM | POA: Diagnosis not present

## 2018-03-16 DIAGNOSIS — G40901 Epilepsy, unspecified, not intractable, with status epilepticus: Secondary | ICD-10-CM | POA: Diagnosis present

## 2018-03-16 DIAGNOSIS — Z7982 Long term (current) use of aspirin: Secondary | ICD-10-CM

## 2018-03-16 DIAGNOSIS — E873 Alkalosis: Secondary | ICD-10-CM | POA: Diagnosis not present

## 2018-03-16 DIAGNOSIS — R402342 Coma scale, best motor response, flexion withdrawal, at arrival to emergency department: Secondary | ICD-10-CM | POA: Diagnosis present

## 2018-03-16 DIAGNOSIS — J9600 Acute respiratory failure, unspecified whether with hypoxia or hypercapnia: Secondary | ICD-10-CM | POA: Diagnosis not present

## 2018-03-16 DIAGNOSIS — I10 Essential (primary) hypertension: Secondary | ICD-10-CM | POA: Diagnosis not present

## 2018-03-16 DIAGNOSIS — E876 Hypokalemia: Secondary | ICD-10-CM | POA: Diagnosis not present

## 2018-03-16 DIAGNOSIS — Z823 Family history of stroke: Secondary | ICD-10-CM | POA: Diagnosis not present

## 2018-03-16 DIAGNOSIS — Z79890 Hormone replacement therapy (postmenopausal): Secondary | ICD-10-CM | POA: Diagnosis not present

## 2018-03-16 DIAGNOSIS — I471 Supraventricular tachycardia: Secondary | ICD-10-CM | POA: Diagnosis not present

## 2018-03-16 DIAGNOSIS — R636 Underweight: Secondary | ICD-10-CM | POA: Diagnosis present

## 2018-03-16 DIAGNOSIS — J9690 Respiratory failure, unspecified, unspecified whether with hypoxia or hypercapnia: Secondary | ICD-10-CM

## 2018-03-16 LAB — RAPID URINE DRUG SCREEN, HOSP PERFORMED
Amphetamines: NOT DETECTED
Barbiturates: POSITIVE — AB
Benzodiazepines: NOT DETECTED
Cocaine: NOT DETECTED
OPIATES: NOT DETECTED
Tetrahydrocannabinol: NOT DETECTED

## 2018-03-16 LAB — I-STAT CHEM 8, ED
BUN: 4 mg/dL — ABNORMAL LOW (ref 8–23)
CALCIUM ION: 1.13 mmol/L — AB (ref 1.15–1.40)
Chloride: 105 mmol/L (ref 98–111)
Creatinine, Ser: 0.6 mg/dL (ref 0.44–1.00)
Glucose, Bld: 102 mg/dL — ABNORMAL HIGH (ref 70–99)
HEMATOCRIT: 40 % (ref 36.0–46.0)
Hemoglobin: 13.6 g/dL (ref 12.0–15.0)
Potassium: 3.6 mmol/L (ref 3.5–5.1)
SODIUM: 144 mmol/L (ref 135–145)
TCO2: 26 mmol/L (ref 22–32)

## 2018-03-16 LAB — TRIGLYCERIDES: Triglycerides: 83 mg/dL (ref <150)

## 2018-03-16 LAB — DIFFERENTIAL
BASOS PCT: 0 %
Basophils Absolute: 0 10*3/uL (ref 0.0–0.1)
EOS ABS: 0 10*3/uL (ref 0.0–0.7)
EOS PCT: 0 %
Lymphocytes Relative: 32 %
Lymphs Abs: 2.3 10*3/uL (ref 0.7–4.0)
Monocytes Absolute: 0.6 10*3/uL (ref 0.1–1.0)
Monocytes Relative: 9 %
Neutro Abs: 4.1 10*3/uL (ref 1.7–7.7)
Neutrophils Relative %: 59 %

## 2018-03-16 LAB — URINALYSIS, ROUTINE W REFLEX MICROSCOPIC
BILIRUBIN URINE: NEGATIVE
Glucose, UA: NEGATIVE mg/dL
KETONES UR: NEGATIVE mg/dL
LEUKOCYTES UA: NEGATIVE
Nitrite: NEGATIVE
PROTEIN: NEGATIVE mg/dL
Specific Gravity, Urine: 1.02 (ref 1.005–1.030)
pH: 8 (ref 5.0–8.0)

## 2018-03-16 LAB — COMPREHENSIVE METABOLIC PANEL
ALT: 23 U/L (ref 0–44)
ANION GAP: 8 (ref 5–15)
AST: 31 U/L (ref 15–41)
Albumin: 3.3 g/dL — ABNORMAL LOW (ref 3.5–5.0)
Alkaline Phosphatase: 51 U/L (ref 38–126)
BUN: 7 mg/dL — ABNORMAL LOW (ref 8–23)
CALCIUM: 9.1 mg/dL (ref 8.9–10.3)
CO2: 31 mmol/L (ref 22–32)
Chloride: 103 mmol/L (ref 98–111)
Creatinine, Ser: 0.58 mg/dL (ref 0.44–1.00)
GFR calc non Af Amer: >60 mL/min (ref >60)
Glucose, Bld: 105 mg/dL — ABNORMAL HIGH (ref 70–99)
Potassium: 3.3 mmol/L — ABNORMAL LOW (ref 3.5–5.1)
SODIUM: 142 mmol/L (ref 135–145)
Total Bilirubin: 0.4 mg/dL (ref 0.3–1.2)
Total Protein: 7.9 g/dL (ref 6.5–8.1)

## 2018-03-16 LAB — CBC
HCT: 39.1 % (ref 36.0–46.0)
Hemoglobin: 13.4 g/dL (ref 12.0–15.0)
MCH: 32.7 pg (ref 26.0–34.0)
MCHC: 34.3 g/dL (ref 30.0–36.0)
MCV: 95.4 fL (ref 78.0–100.0)
PLATELETS: 224 10*3/uL (ref 150–400)
RBC: 4.1 MIL/uL (ref 3.87–5.11)
RDW: 14.4 % (ref 11.5–15.5)
WBC: 7 10*3/uL (ref 4.0–10.5)

## 2018-03-16 LAB — BLOOD GAS, ARTERIAL
Acid-Base Excess: 4.4 mmol/L — ABNORMAL HIGH (ref 0.0–2.0)
Bicarbonate: 28.2 mmol/L — ABNORMAL HIGH (ref 20.0–28.0)
Drawn by: 234301
FIO2: 60
O2 Saturation: 99.1 %
PATIENT TEMPERATURE: 37
PCO2 ART: 43.4 mmHg (ref 32.0–48.0)
PEEP: 5 cmH2O
RATE: 15 resp/min
VT: 330 mL
pH, Arterial: 7.434 (ref 7.350–7.450)
pO2, Arterial: 221 mmHg — ABNORMAL HIGH (ref 83.0–108.0)

## 2018-03-16 LAB — I-STAT TROPONIN, ED: Troponin i, poc: 0 ng/mL (ref 0.00–0.08)

## 2018-03-16 LAB — MRSA PCR SCREENING: MRSA by PCR: NEGATIVE

## 2018-03-16 LAB — CBG MONITORING, ED: GLUCOSE-CAPILLARY: 104 mg/dL — AB (ref 70–99)

## 2018-03-16 LAB — PROTIME-INR
INR: 1.06
PROTHROMBIN TIME: 13.7 s (ref 11.4–15.2)

## 2018-03-16 LAB — APTT: aPTT: 29 seconds (ref 24–36)

## 2018-03-16 LAB — PHENOBARBITAL LEVEL: PHENOBARBITAL: 44.5 ug/mL — AB (ref 15.0–30.0)

## 2018-03-16 LAB — VALPROIC ACID LEVEL: Valproic Acid Lvl: 81 ug/mL (ref 50.0–100.0)

## 2018-03-16 LAB — ETHANOL

## 2018-03-16 MED ORDER — AMLODIPINE BESYLATE 5 MG PO TABS
5.0000 mg | ORAL_TABLET | Freq: Every day | ORAL | Status: DC
  Administered 2018-03-16 – 2018-03-21 (×6): 5 mg
  Filled 2018-03-16 (×6): qty 1

## 2018-03-16 MED ORDER — CLONIDINE HCL 0.1 MG PO TABS
0.1000 mg | ORAL_TABLET | Freq: Every day | ORAL | Status: DC
  Administered 2018-03-16 – 2018-03-20 (×4): 0.1 mg
  Filled 2018-03-16 (×4): qty 1

## 2018-03-16 MED ORDER — PROPOFOL 1000 MG/100ML IV EMUL
INTRAVENOUS | Status: AC
  Filled 2018-03-16: qty 100

## 2018-03-16 MED ORDER — PROPOFOL 1000 MG/100ML IV EMUL
0.0000 ug/kg/min | INTRAVENOUS | Status: DC
  Administered 2018-03-17: 10 ug/kg/min via INTRAVENOUS
  Administered 2018-03-17: 40 ug/kg/min via INTRAVENOUS
  Administered 2018-03-18 (×2): 20 ug/kg/min via INTRAVENOUS
  Administered 2018-03-18: 40 ug/kg/min via INTRAVENOUS
  Administered 2018-03-19: 10 ug/kg/min via INTRAVENOUS
  Administered 2018-03-19: 40 ug/kg/min via INTRAVENOUS
  Administered 2018-03-20: 20 ug/kg/min via INTRAVENOUS
  Administered 2018-03-20: 10 ug/kg/min via INTRAVENOUS
  Administered 2018-03-21: 20 ug/kg/min via INTRAVENOUS
  Filled 2018-03-16 (×9): qty 100

## 2018-03-16 MED ORDER — ACETAMINOPHEN 325 MG PO TABS: 650.0000 mg | ORAL_TABLET | ORAL | Status: DC | PRN

## 2018-03-16 MED ORDER — SODIUM CHLORIDE 0.9 % IV SOLN
250.0000 mL | INTRAVENOUS | Status: DC | PRN
  Administered 2018-03-16: 250 mL via INTRAVENOUS

## 2018-03-16 MED ORDER — ASPIRIN EC 81 MG PO TBEC: 162.0000 mg | DELAYED_RELEASE_TABLET | Freq: Every evening | ORAL | Status: DC

## 2018-03-16 MED ORDER — PROPOFOL 10 MG/ML IV BOLUS: 0.5000 mg/kg | Freq: Once | INTRAVENOUS | Status: DC

## 2018-03-16 MED ORDER — AMLODIPINE BESYLATE 5 MG PO TABS: 5.0000 mg | ORAL_TABLET | Freq: Every day | ORAL | Status: DC

## 2018-03-16 MED ORDER — ROCURONIUM BROMIDE 50 MG/5ML IV SOLN: 1.0000 mg/kg | Freq: Once | INTRAVENOUS | Status: DC

## 2018-03-16 MED ORDER — CHLORHEXIDINE GLUCONATE 0.12% ORAL RINSE (MEDLINE KIT)
15.0000 mL | Freq: Two times a day (BID) | OROMUCOSAL | Status: DC
  Administered 2018-03-16 – 2018-03-21 (×10): 15 mL via OROMUCOSAL

## 2018-03-16 MED ORDER — LORAZEPAM 2 MG/ML IJ SOLN
INTRAMUSCULAR | Status: AC
  Administered 2018-03-16: 1 mg
  Filled 2018-03-16: qty 1

## 2018-03-16 MED ORDER — PANTOPRAZOLE SODIUM 40 MG PO PACK
40.0000 mg | PACK | Freq: Every day | ORAL | Status: DC
  Administered 2018-03-16 – 2018-03-21 (×6): 40 mg
  Filled 2018-03-16 (×6): qty 20

## 2018-03-16 MED ORDER — ROCURONIUM BROMIDE 50 MG/5ML IV SOLN
INTRAVENOUS | Status: AC | PRN
  Administered 2018-03-16: 50 mg via INTRAVENOUS

## 2018-03-16 MED ORDER — ALBUTEROL SULFATE (2.5 MG/3ML) 0.083% IN NEBU: 2.5000 mg | INHALATION_SOLUTION | RESPIRATORY_TRACT | Status: DC | PRN

## 2018-03-16 MED ORDER — PHENOBARBITAL 97.2 MG PO TABS
97.2000 mg | ORAL_TABLET | Freq: Every day | ORAL | Status: DC
  Administered 2018-03-16 – 2018-03-17 (×2): 97.2 mg via ORAL
  Filled 2018-03-16 (×3): qty 1

## 2018-03-16 MED ORDER — VALPROATE SODIUM 500 MG/5ML IV SOLN
500.0000 mg | Freq: Three times a day (TID) | INTRAVENOUS | Status: DC
  Administered 2018-03-16 – 2018-03-21 (×15): 500 mg via INTRAVENOUS
  Filled 2018-03-16 (×18): qty 5

## 2018-03-16 MED ORDER — ORAL CARE MOUTH RINSE
15.0000 mL | OROMUCOSAL | Status: DC
  Administered 2018-03-16 – 2018-03-21 (×49): 15 mL via OROMUCOSAL

## 2018-03-16 MED ORDER — FENTANYL CITRATE (PF) 100 MCG/2ML IJ SOLN
50.0000 ug | INTRAMUSCULAR | Status: DC | PRN
  Administered 2018-03-16 – 2018-03-19 (×5): 50 ug via INTRAVENOUS
  Filled 2018-03-16 (×3): qty 2

## 2018-03-16 MED ORDER — ENOXAPARIN SODIUM 40 MG/0.4ML ~~LOC~~ SOLN
40.0000 mg | SUBCUTANEOUS | Status: DC
  Administered 2018-03-16 – 2018-03-20 (×5): 40 mg via SUBCUTANEOUS
  Filled 2018-03-16 (×5): qty 0.4

## 2018-03-16 MED ORDER — LEVOTHYROXINE SODIUM 25 MCG PO TABS: 25.0000 ug | ORAL_TABLET | Freq: Every day | ORAL | Status: DC

## 2018-03-16 MED ORDER — IOPAMIDOL (ISOVUE-370) INJECTION 76%
100.0000 mL | Freq: Once | INTRAVENOUS | Status: AC | PRN
  Administered 2018-03-16: 75 mL via INTRAVENOUS

## 2018-03-16 MED ORDER — LEVETIRACETAM IN NACL 500 MG/100ML IV SOLN
500.0000 mg | Freq: Two times a day (BID) | INTRAVENOUS | Status: DC
  Administered 2018-03-16 – 2018-03-17 (×2): 500 mg via INTRAVENOUS
  Filled 2018-03-16 (×2): qty 100

## 2018-03-16 MED ORDER — LEVETIRACETAM IN NACL 1000 MG/100ML IV SOLN
INTRAVENOUS | Status: AC
  Administered 2018-03-16: 1000 mg
  Filled 2018-03-16: qty 100

## 2018-03-16 MED ORDER — PROPOFOL 10 MG/ML IV BOLUS
INTRAVENOUS | Status: AC | PRN
  Administered 2018-03-16: 25 mg via INTRAVENOUS

## 2018-03-16 MED ORDER — LORAZEPAM 2 MG/ML IJ SOLN
1.0000 mg | Freq: Once | INTRAMUSCULAR | Status: AC
  Administered 2018-03-16: 1 mg via INTRAVENOUS

## 2018-03-16 MED ORDER — PROPOFOL 1000 MG/100ML IV EMUL
0.0000 ug/kg/min | INTRAVENOUS | Status: DC
  Administered 2018-03-16: 5 ug/kg/min via INTRAVENOUS

## 2018-03-16 MED ORDER — PROPOFOL 10 MG/ML IV BOLUS
INTRAVENOUS | Status: AC
  Filled 2018-03-16: qty 20

## 2018-03-16 MED ORDER — CLONIDINE HCL 0.1 MG PO TABS: 0.1000 mg | ORAL_TABLET | Freq: Every day | ORAL | Status: DC

## 2018-03-16 MED ORDER — FENTANYL CITRATE (PF) 100 MCG/2ML IJ SOLN
50.0000 ug | INTRAMUSCULAR | Status: DC | PRN
  Administered 2018-03-18: 50 ug via INTRAVENOUS
  Filled 2018-03-16 (×2): qty 2

## 2018-03-16 NOTE — Procedures (Signed)
History: 71 year old female female being evaluated for seizures  Sedation: Propofol  Technique: This is a 21 channel stat scalp EEG performed at the bedside with bipolar and monopolar montages arranged in accordance to the international 10/20 system of electrode placement. One channel was dedicated to EKG recording.    Background: There is a posterior dominant rhythm of 8 Hz.  In addition there is generalized irregular delta and theta activity.  There is also focal right posterior quadrant irregular delta activity.  There is an excess of frontocentral predominant beta activity.  Photic stimulation: Physiologic driving is not performed  EEG Abnormalities: 1) generalized irregular slow activity  Clinical Interpretation: This EEG is consistent with a focal area of cerebral dysfunction in the right posterior quadrant consistent with the patient's known stroke, in the setting of a more generalized diffuse cerebral dysfunction that is nonspecific in nature.    There was no seizure or seizure predisposition recorded on this study. Please note that a normal EEG does not preclude the possibility of epilepsy.   Roland Rack, MD [facility] [phone]  If 7pm- 7am, please page neurology on call as listed in [facility].

## 2018-03-16 NOTE — Progress Notes (Signed)
No LVO on CTA.  Spoke with ED MD. Pt still intermittently twitching.  He will intubate and start Propofol EEG

## 2018-03-16 NOTE — ED Notes (Signed)
Treatment decision by neurologist. Not candidate for Tpa. Patient most likely having a seizure. Neurologist ordered 1 gram of Keppra IV. Continue neuro checks ever 2 hours for 24 hours.

## 2018-03-16 NOTE — ED Triage Notes (Signed)
EMS reports pt last known well per family 1130. Per family unable to understand and speech is garbled and altered mental statusPt has hx of stroke with left side hemiplegia. Husband states she got up and ate breakfast

## 2018-03-16 NOTE — Code Documentation (Signed)
Preparing pt for intubation.  Glidescope at bedside.  RT present.

## 2018-03-16 NOTE — Consult Note (Signed)
   [facility] Tele[facility]urology Consult Services  Impression:  Seziure R/O Stroke   Not a tpa candidate due CH:ENID likely to be stroke. Ongoing seizure. h/o brain tumor post surgery. Symptoms  are consistent with LVO therefore CTA Head and Neck recommended.  Comments:   Last Known Well:11:00 [facility] contacted: 12:59 [facility] at bedside: 13:03 NIHSS assessment time: 13:10  Recommendations:  Ativan 1 mg now, can repeat if continues with seizure. Keppra 1gm now. Depakote and phenobarb level. Inpatient neurology consultation Inpatient evaluation as per Neurology/ Internal Medicine Discussed with ED MD Please call with questions -----------------------------------------------------------------------------------------  CC: confusion  History of Present Illness:  Patient is a 72 YO F with h/o stroke, left frontal meningioma resection with craniotomy and seizure disorder on Depakote and phenobarbital presented with AMS. At baseline she has aphasia and unable to walk but today it started to get worse around 11:00. She has continuous facial twitching on the right that improved with Ativan. She has right gaze preference.   Diagnostic: CT Head: No acute Intracranial findings. Post surgical changes on the left.   2. Remote right more than left occipital infarction. Remote lateral left frontal infarction. 3. Moderate to advanced motion degradation.    Exam: NIH Stroke Scale/Score (NIHSS)  RESULT SUMMARY: 23 points NIH Stroke Scale   INPUTS: 1A: Level of consciousness -> 0 = Alert; keenly responsive 1B: Ask month and age -> 2 = 0 questions right  1C: 'Blink eyes' & 'squeeze hands' -> 2 = Performs 0 tasks 2: Horizontal extraocular movements -> 1 = Partial gaze palsy: corrects with oculocephalic reflex 3: Visual fields -> 0 = No visual loss 4: Facial palsy -> 1 = Minor paralysis (flat nasolabial fold, smile asymmetry) 5A: Left arm motor drift -> 3 = No effort  against gravity 5B: Right arm motor drift -> 3 = No effort against gravity 6A: Left leg motor drift -> 3 = No effort against gravity 6B: Right leg motor drift -> 3 = No effort against gravity 7: Limb Ataxia -> 0 = Does not understand 8: Sensation -> 0 = Normal; no sensory loss 9: Language/aphasia -> 2 = Severe aphasia: fragmentary expression, inference needed, cannot identify materials 10: Dysarthria -> 2 = Severe dysarthria: unintelligible slurring or out of proportion to dysphasia 11: Extinction/inattention -> 1 = Extinction to bilateral simultaneous stimulation  Medical Decision Making:  - Extensive number of diagnosis or management options are considered above.   - Extensive amount of complex data reviewed.   - High risk of complication and/or morbidity or mortality are associated with differential diagnostic considerations above.  - There may be Uncertain outcome and increased probability of prolonged functional impairment or high probability of severe prolonged functional impairment associated with some of these differential diagnosis.  Medical Data Reviewed:  1.Data reviewed include clinical labs, radiology,  Medical Tests;   2.Tests results discussed w/performing or interpreting physician;   3.Obtaining/reviewing old medical records;  4.Obtaining case history from another source;  5.Independent review of image, tracing or specimen.    Patient was informed the neurology consult would happen via telehealth consult by way of interactive audio and video telecommunications and consented to receiving care in this manner.

## 2018-03-16 NOTE — ED Notes (Signed)
Neuro requesting keppra I gram and CT angiogram. EDP notified

## 2018-03-16 NOTE — ED Notes (Signed)
Patient returned from CT

## 2018-03-16 NOTE — Progress Notes (Signed)
EEG complete - results pending 

## 2018-03-16 NOTE — ED Notes (Signed)
CT paged at 37 MD examining patient

## 2018-03-16 NOTE — H&P (Signed)
PULMONARY / CRITICAL CARE MEDICINE   Name: [name] MRN: [id number] DOB: February 05, 1946    ADMISSION DATE:  03/10/2018 CONSULTATION DATE:  03/27/2018  REFERRING MD:  [facility]  CHIEF COMPLAINT: Seizures  HISTORY OF PRESENT ILLNESS:   72 year old woman with hypertension, prior CVA and seizure disorder brought in by EMS to any pain ED as a code stroke with acute onset weakness on the right side, last seen normal 12:10 PM.  At baseline she has left-sided weakness and aphasia, per report. She was evaluated by tele neurology .  CT did not show any evidence of stroke or large vessel occlusion.  Right face twitching was noted which did not improve with Ativan and Keppra.  With the suspicion of nonconvulsive status, she was intubated and started on propofol and transferred to [facility].  Transport was uneventful  PAST MEDICAL HISTORY :  She  has a past medical history of Aphasia, GI bleed (12/01/2016), Hypertension, Seizures ([place]), Stroke [facility]) (2009, 2015 9/27, 11/2014), and Thyroid disease.  PAST SURGICAL HISTORY: She  has a past surgical history that includes PEG tube placement; PEG tube removal; Abdominal hysterectomy; Craniotomy (Left, 06/18/2015); and Colonoscopy (N/A, 12/03/2016).  Allergies  Allergen Reactions  . Codeine Nausea And Vomiting and Rash    No current facility-administered medications on file prior to encounter.    Current Outpatient Medications on File Prior to Encounter  Medication Sig  . acetaminophen (TYLENOL) 325 MG tablet Take 650 mg by mouth every 6 (six) hours as needed.  Marland Kitchen amitriptyline (ELAVIL) 10 MG tablet Take 20 mg by mouth at bedtime.  Marland Kitchen amLODipine (NORVASC) 2.5 MG tablet Take 2.5 mg along with 5 mg to = 7.5 mg once a day  . amLODipine (NORVASC) 5 MG tablet Take 5 mg along with 2.5 mg to = 7.5 mg once a day  . aspirin EC 81 MG EC tablet Take 2 tablets (162 mg total) by mouth every evening.  Marland Kitchen atorvastatin (LIPITOR) 40 MG tablet Take 40 mg by mouth every evening.    Roseanne Kaufman [place]-Castor Oil (VENELEX) OINT Apply to sacrum and bilateral buttocks q shift for prevention and incontinence or erythema every shift  . cholecalciferol (VITAMIN D) 400 units TABS tablet Take 400 Units by mouth daily.  . cloNIDine (CATAPRES) 0.1 MG tablet Take 0.1 mg by mouth daily.  Marland Kitchen docusate (COLACE) 50 MG/5ML liquid Take 100 mg by mouth daily.  Marland Kitchen levothyroxine (SYNTHROID, LEVOTHROID) 25 MCG tablet Take 25 mcg by mouth daily before breakfast.  . LORazepam (ATIVAN) 0.5 MG tablet Take 0.25 mg by mouth every 6 (six) hours as needed for anxiety.   Marland Kitchen PHENobarbital (LUMINAL) 97.2 MG tablet Take 1 tablet (97.2 mg total) by mouth at bedtime.  Marland Kitchen QUEtiapine (SEROQUEL) 25 MG tablet Take 25 mg by mouth at bedtime.  . valproic acid (DEPAKENE) 250 MG capsule Take 2 capsules (500 mg total) by mouth 3 (three) times daily.    FAMILY HISTORY:  Her family history includes Colon cancer in her sister; Coronary artery disease in her unknown relative; Stroke in her father; Sudden death in her father; Ulcers in her mother.  SOCIAL HISTORY: She  reports that she has been smoking cigarettes. She has a 75.00 pack-year smoking history. She has never used smokeless tobacco. She reports that she does not drink alcohol or use drugs.  REVIEW OF SYSTEMS:   Unable to obtain since unresponsive  SUBJECTIVE:    VITAL SIGNS: BP (!) 155/108   Pulse 79   Temp (!)  97.2 F (36.2 C)   Resp 15   Ht 5\' 4"  (1.626 m)   SpO2 100%   BMI 18.92 kg/m   HEMODYNAMICS:    VENTILATOR SETTINGS: Vent Mode: PRVC FiO2 (%):  [40 %-60 %] 40 % Set Rate:  [15 bmp] 15 bmp Vt Set:  [330 mL] 330 mL PEEP:  [5 cmH20] 5 cmH20 Plateau Pressure:  [12 cmH20] 12 cmH20  INTAKE / OUTPUT: No intake/output data recorded.  PHYSICAL EXAMINATION: General: Poorly nourished, chronically ill-appearing, sedated on propofol Neuro:  RASS-3, does not follow commands, pupils 3 mm bilaterally reactive to light, no seizure activity HEENT: No  pallor, icterus or JVD, oral ET tube Cardiovascular: S1-S2 normal, sinus on monitor with few runs of SVT Lungs: Clear breath sounds, no rhonchi Abdomen: Soft and nontender Musculoskeletal: Left forearm and hand contracture with some wasting Skin: No rash  LABS:  BMET Recent Labs  Lab 03/09/2018 1256 03/24/2018 1313  NA 142 144  K 3.3* 3.6  CL 103 105  CO2 31  --   BUN 7* 4*  CREATININE 0.58 0.60  GLUCOSE 105* 102*    Electrolytes Recent Labs  Lab 03/27/2018 1256  CALCIUM 9.1    CBC Recent Labs  Lab 03/13/2018 1256 03/25/2018 1313  WBC 7.0  --   HGB 13.4 13.6  HCT 39.1 40.0  PLT 224  --     Coag's Recent Labs  Lab 03/11/2018 1256  APTT 29  INR 1.06    Sepsis Markers No results for input(s): LATICACIDVEN, PROCALCITON, O2SATVEN in the last 168 hours.  ABG Recent Labs  Lab 04/01/2018 1625  PHART 7.434  PCO2ART 43.4  PO2ART 221*    Liver Enzymes Recent Labs  Lab 03/20/2018 1256  AST 31  ALT 23  ALKPHOS 51  BILITOT 0.4  ALBUMIN 3.3*    Cardiac Enzymes No results for input(s): TROPONINI, PROBNP in the last 168 hours.  Glucose Recent Labs  Lab 03/25/2018 1307  GLUCAP 104*    Imaging Ct Angio Head W Or Wo Contrast  Result Date: 03/10/2018 CLINICAL DATA:  Arterial stricture/occlusion of the head or neck. Left frontal meningioma. Craniotomy. Seizure disorder. Baseline aphasia and difficulty walking with progression beginning 3 hours ago. EXAM: CT ANGIOGRAPHY HEAD AND NECK TECHNIQUE: Multidetector CT imaging of the head and neck was performed using the standard protocol during bolus administration of intravenous contrast. Multiplanar CT image reconstructions and MIPs were obtained to evaluate the vascular anatomy. Carotid stenosis measurements (when applicable) are obtained utilizing NASCET criteria, using the distal internal carotid diameter as the denominator. CONTRAST:  39mL ISOVUE-370 IOPAMIDOL (ISOVUE-370) INJECTION 76% COMPARISON:  CT head without contrast  03/31/2018. FINDINGS: CT HEAD FINDINGS Brain: Chronic bilateral occipital and parietal encephalomalacia is again noted. Left frontal craniotomy is present. Chronic encephalomalacia of the left operculum is stable. Mild white matter changes otherwise are stable. No acute infarct, hemorrhage, or mass lesion is present. Ventricles are proportionate to the degree of atrophy and encephalomalacia. Vascular: Atherosclerotic calcifications are present within cavernous internal carotid arteries bilaterally. Skull: Apart from a craniotomy, calvarium is intact. No focal lytic or blastic lesions are present. Sinuses: Paranasal sinuses and mastoid air cells are clear. Orbits: Globes and orbits are within normal limits bilaterally. Review of the MIP images confirms the above findings CTA NECK FINDINGS Aortic arch: There is a common origin of the left common carotid artery in the innominate artery. Atherosclerotic calcifications are present at the origin of the subclavian without significant stenosis. There is no aneurysm.  Right carotid system: The right common carotid artery demonstrates distal atherosclerotic disease with calcified and noncalcified plaque. Mild narrowing is present without a significant stenosis relative to the more distal vessel. Additional atherosclerotic calcifications are present at the right carotid bifurcation without a significant stenosis. There is some tortuosity of the cervical right ICA without significant stenosis. Left carotid system: The left common carotid artery is within normal limits proximally. Atherosclerotic disease is present in the distal common carotid artery through the bifurcation. Question previous endarterectomy. The cervical left ICA is tortuous without focal stenosis. Vertebral arteries: The left vertebral artery the dominant vessel. Significant stenosis is present within either vertebral artery in the neck. There is moderate tortuosity bilaterally. Skeleton: Degenerative changes  are present in the cervical spine. No focal lytic or blastic lesions are present. Slight degenerative anterolisthesis present C5-6. Alignment is otherwise anatomic. The patient is edentulous. Other neck: Soft tissues the neck are otherwise unremarkable. No focal mucosal or submucosal lesions are present. Salivary glands are normal. Vocal cords and thyroid are normal. Upper chest: The lung apices are clear.  Thoracic inlet is normal. Review of the MIP images confirms the above findings CTA HEAD FINDINGS Anterior circulation: Atherosclerotic calcifications are present within the cavernous internal carotid arteries. No significant stenosis is present. ICA termini are within normal limits. The A1 and M1 segments are normal. MCA bifurcations are intact. Anterior communicating artery is normal. Segmental irregularity is more prominent in the right than left MCA branch vessels. Mild proximal M2 segment narrowing is present just beyond the bifurcation. Moderate distal MCA branch vessel narrowing is also present. Posterior circulation: Vertebral arteries are within normal limits bilaterally. PICA origins are visualized and normal. Atherosclerotic calcifications are present vertebrobasilar junction without focal stenosis. Basilar tip is normal. Both posterior cerebral arteries originate from the basilar tip. PCA branch vessels are normal bilaterally. Venous sinuses: Dural sinuses are patent. Straight sinus and deep cerebral veins are intact. Cortical veins are unremarkable. Anatomic variants: None Delayed phase: Postcontrast images demonstrate no pathologic enhancement. Review of the MIP images confirms the above findings IMPRESSION: 1. M2 segment narrowing and branch vessel atherosclerotic disease in the right greater than left MCA branch vessels without a significant proximal stenosis or occlusion in the middle cerebral artery bilaterally. 2. Atherosclerotic changes at both carotid bifurcations without significant stenosis.  Question prior left carotid endarterectomy. 3. Atherosclerotic changes at the dural margin of the vertebral arteries and the vertebrobasilar junction without significant stenosis. 4. Chronic encephalomalacia involving the parietoccipital lobes bilaterally as well as the left frontal operculum. Left frontal craniotomy. No residual or recurrent meningioma. Electronically Signed   By: San Morelle M.D.   On: 03/12/2018 14:23   Ct Angio Neck W Or Wo Contrast  Result Date: 03/15/2018 CLINICAL DATA:  Arterial stricture/occlusion of the head or neck. Left frontal meningioma. Craniotomy. Seizure disorder. Baseline aphasia and difficulty walking with progression beginning 3 hours ago. EXAM: CT ANGIOGRAPHY HEAD AND NECK TECHNIQUE: Multidetector CT imaging of the head and neck was performed using the standard protocol during bolus administration of intravenous contrast. Multiplanar CT image reconstructions and MIPs were obtained to evaluate the vascular anatomy. Carotid stenosis measurements (when applicable) are obtained utilizing NASCET criteria, using the distal internal carotid diameter as the denominator. CONTRAST:  63mL ISOVUE-370 IOPAMIDOL (ISOVUE-370) INJECTION 76% COMPARISON:  CT head without contrast 03/30/2018. FINDINGS: CT HEAD FINDINGS Brain: Chronic bilateral occipital and parietal encephalomalacia is again noted. Left frontal craniotomy is present. Chronic encephalomalacia of the left operculum is stable. Mild  white matter changes otherwise are stable. No acute infarct, hemorrhage, or mass lesion is present. Ventricles are proportionate to the degree of atrophy and encephalomalacia. Vascular: Atherosclerotic calcifications are present within cavernous internal carotid arteries bilaterally. Skull: Apart from a craniotomy, calvarium is intact. No focal lytic or blastic lesions are present. Sinuses: Paranasal sinuses and mastoid air cells are clear. Orbits: Globes and orbits are within normal limits  bilaterally. Review of the MIP images confirms the above findings CTA NECK FINDINGS Aortic arch: There is a common origin of the left common carotid artery in the innominate artery. Atherosclerotic calcifications are present at the origin of the subclavian without significant stenosis. There is no aneurysm. Right carotid system: The right common carotid artery demonstrates distal atherosclerotic disease with calcified and noncalcified plaque. Mild narrowing is present without a significant stenosis relative to the more distal vessel. Additional atherosclerotic calcifications are present at the right carotid bifurcation without a significant stenosis. There is some tortuosity of the cervical right ICA without significant stenosis. Left carotid system: The left common carotid artery is within normal limits proximally. Atherosclerotic disease is present in the distal common carotid artery through the bifurcation. Question previous endarterectomy. The cervical left ICA is tortuous without focal stenosis. Vertebral arteries: The left vertebral artery the dominant vessel. Significant stenosis is present within either vertebral artery in the neck. There is moderate tortuosity bilaterally. Skeleton: Degenerative changes are present in the cervical spine. No focal lytic or blastic lesions are present. Slight degenerative anterolisthesis present C5-6. Alignment is otherwise anatomic. The patient is edentulous. Other neck: Soft tissues the neck are otherwise unremarkable. No focal mucosal or submucosal lesions are present. Salivary glands are normal. Vocal cords and thyroid are normal. Upper chest: The lung apices are clear.  Thoracic inlet is normal. Review of the MIP images confirms the above findings CTA HEAD FINDINGS Anterior circulation: Atherosclerotic calcifications are present within the cavernous internal carotid arteries. No significant stenosis is present. ICA termini are within normal limits. The A1 and M1 segments  are normal. MCA bifurcations are intact. Anterior communicating artery is normal. Segmental irregularity is more prominent in the right than left MCA branch vessels. Mild proximal M2 segment narrowing is present just beyond the bifurcation. Moderate distal MCA branch vessel narrowing is also present. Posterior circulation: Vertebral arteries are within normal limits bilaterally. PICA origins are visualized and normal. Atherosclerotic calcifications are present vertebrobasilar junction without focal stenosis. Basilar tip is normal. Both posterior cerebral arteries originate from the basilar tip. PCA branch vessels are normal bilaterally. Venous sinuses: Dural sinuses are patent. Straight sinus and deep cerebral veins are intact. Cortical veins are unremarkable. Anatomic variants: None Delayed phase: Postcontrast images demonstrate no pathologic enhancement. Review of the MIP images confirms the above findings IMPRESSION: 1. M2 segment narrowing and branch vessel atherosclerotic disease in the right greater than left MCA branch vessels without a significant proximal stenosis or occlusion in the middle cerebral artery bilaterally. 2. Atherosclerotic changes at both carotid bifurcations without significant stenosis. Question prior left carotid endarterectomy. 3. Atherosclerotic changes at the dural margin of the vertebral arteries and the vertebrobasilar junction without significant stenosis. 4. Chronic encephalomalacia involving the parietoccipital lobes bilaterally as well as the left frontal operculum. Left frontal craniotomy. No residual or recurrent meningioma. Electronically Signed   By: San Morelle M.D.   On: 03/15/2018 14:23   Dg Chest Portable 1 View  Result Date: 04/03/2018 CLINICAL DATA:  ETT placement EXAM: PORTABLE CHEST 1 VIEW COMPARISON:  11/20/2017 FINDINGS: Endotracheal  tube terminates 3.5 cm above the carina. Lungs are clear.  No pleural effusion or pneumothorax. The heart is normal in  size. Enteric tube courses into the proximal stomach. IMPRESSION: Endotracheal tube terminates 3.5 cm above the carina. Electronically Signed   By: Julian Hy M.D.   On: 03/20/2018 15:55   Ct Head Code Stroke Wo Contrast  Result Date: 03/24/2018 CLINICAL DATA:  Code stroke. Weakness and slurred speech. History of stroke EXAM: CT HEAD WITHOUT CONTRAST TECHNIQUE: Contiguous axial images were obtained from the base of the skull through the vertex without intravenous contrast. COMPARISON:  11/20/2017 FINDINGS: Brain: No evidence of acute infarction, hemorrhage, hydrocephalus, extra-axial collection or mass lesion/mass effect. Remote right more than left occipital infarction with volume loss. Remote lateral left frontal infarct that is stable from prior. Chronic small vessel ischemic gliosis in the cerebral white matter. Dilated perivascular space below the right putamen. Vascular: No hyperdense vessel or unexpected calcification. Skull: Prior left-sided craniotomy. Sinuses/Orbits: No acute finding Other: These results were called by telephone at the time of interpretation on 03/23/2018 at 1:06 pm to Dr. Vonna Kotyk LONG via Dr Sabra Heck. ASPECTS [facility] Stroke Program Early CT Score) Not scored given the extent of chronic change IMPRESSION: 1. No acute finding when compared to prior. 2. Remote right more than left occipital infarction. Remote lateral left frontal infarction. 3. Moderate to advanced motion degradation. Electronically Signed   By: Monte Fantasia M.D.   On: 03/07/2018 13:07     STUDIES:  CT head angio 8/10 >> diffuse atherosclerosis, chronic encephalomalacia, no large vessel occlusion, right M2 narrowing  CULTURES:   ANTIBIOTICS:   SIGNIFICANT EVENTS: 8/10 tr from [facility]  LINES/TUBES: ETT 8/10 >>  DISCUSSION: Appears to be nonconvulsive status with facial twitching, now with propofol coma Unclear compliance with phenobarbital and valproic  ASSESSMENT / PLAN:  PULMONARY A: Acute  respiratory failure P:   Vent settings were reviewed and adjusted ABG and chest x-ray personally reviewed  CARDIOVASCULAR A:  Hypertension P:  Resume amlodipine and clonidine Use hydralazine as needed  RENAL A:   Mild hypokalemia P:   Replete  GASTROINTESTINAL A:   No issues P:   N.p.o. for now Protonix for stress ulcer prophylaxis  HEMATOLOGIC A:   No issues P:  Subcu Lovenox for DVT prophylaxis  INFECTIOUS A:   No issues P:     ENDOCRINE A:   No issues, at risk hypoglycemia P:   CBGs while n.p.o.  NEUROLOGIC A:   Nonconvulsive status epilepticus P:   RASS goal: -2 Per neurology, may need EEG/ LTM Propofol coma , meanwhile Consider checking phenobarbitone valproate levels Hold Seroquel and amitriptyline for now   FAMILY  - Updates: No family at bedside, defer to neurology - Inter-disciplinary family meet or Palliative Care meeting due by:  day [place] MD. [facility]. [place] [facility] Pager [phone] If no response call [phone]     03/15/2018, 6:03 PM

## 2018-03-16 NOTE — ED Provider Notes (Signed)
Emergency Department Provider Note   I have reviewed the triage vital signs and the nursing notes.   HISTORY  Chief Complaint Code Stroke   HPI [name] is a 72 y.o. female with PMH of prior CVA, aphasia, seizures, and HTN's to the emergency department for evaluation as a code stroke.  The patient had acute onset beach difficulty and right-sided weakness 30 minutes prior to arrival. Last seen normal 12:10 PM.  EMS notes a history of prior strokes with left side deficits.   Level 5 caveat: CVA with aphasia.   Past Medical History:  Diagnosis Date  . Aphasia   . GI bleed 12/01/2016  . Hypertension   . Seizures ([place])   . Stroke [facility]) 2009, 2015 9/27, 11/2014  . Thyroid disease     Patient Active Problem List   Diagnosis Date Noted  . Status epilepticus ([place]) 04/03/2018  . CVA (cerebral vascular accident) ([place]) 11/20/2017  . 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 Dysarthria 09/24/2017  . GI bleed 12/01/2016  . Acute blood loss anemia 12/01/2016  . Lower GI bleed   . Hemi-neglect of right side   . Hypothyroidism 01/19/2016  . Depression 01/19/2016  . Speech disturbance 01/19/2016  . Hyponatremia 01/19/2016  . Acute encephalopathy 01/19/2016  . Meningioma ([place]) 11/12/2014  . HTN (hypertension) 11/10/2014  . Tobacco use 11/10/2014  . History of CVA (cerebrovascular accident) 05/03/2014    Past Surgical History:  Procedure Laterality Date  . ABDOMINAL HYSTERECTOMY    . COLONOSCOPY N/A 12/03/2016   Procedure: COLONOSCOPY;  Surgeon: Daneil Dolin, MD;  Location: [facility];  Service: Endoscopy;  Laterality: N/A;  . CRANIOTOMY Left 06/18/2015   Procedure: CRANIOTOMY TUMOR EXCISION w/Curve;  Surgeon: Consuella Lose, MD;  Location: [place] NEURO ORS;  Service: Neurosurgery;  Laterality: Left;  stereotactic Left Craniotomy for resection of meningioma  . PEG TUBE PLACEMENT    . PEG TUBE REMOVAL       Allergies Codeine  Family History  Problem Relation Age of Onset  . Coronary artery disease Unknown        Stents   . Stroke Father        Hemorrhagic   . Sudden death Father   . Ulcers Mother        Bleeding  . Colon cancer Sister     Social History Social History   Tobacco Use  . Smoking status: Current Every Day Smoker    Packs/day: 1.50    Years: 50.00    Pack years: 75.00    Types: Cigarettes  . Smokeless tobacco: Never Used  Substance Use Topics  . Alcohol use: No  . Drug use: No    Review of Systems  Level 5 caveat: CVA with aphasia.   ____________________________________________   PHYSICAL EXAM:  VITAL SIGNS: Vitals:   03/13/2018 1449 04/04/2018 1455  BP: (!) 154/72 (!) 194/124  Pulse: 78 95  Resp: (!) 21 16  SpO2: 100% 100%    Constitutional: Alert but seems confused. No gaze preference. Global weakness throughout with notable aphasia.  Eyes: Conjunctivae are normal. PERRL. Head: Atraumatic. Nose: No congestion/rhinnorhea. Mouth/Throat: Mucous membranes are moist.  Neck: No stridor.  Cardiovascular: Normal rate, regular rhythm. Good peripheral circulation. Grossly normal heart sounds.   Respiratory: Normal respiratory effort.  No retractions. Lungs CTAB. Gastrointestinal: Soft and nontender. No distention.  Musculoskeletal: No lower extremity tenderness nor edema. No gross deformities of extremities. Neurologic:  Speech is slurred and  difficult to make out words. More global weakness with left side worse than right (known left side deficits in the past). No facial asymmetry.  Skin:  Skin is warm, dry and intact. No rash noted.  ____________________________________________   LABS (all labs ordered are listed, but only abnormal results are displayed)  Labs Reviewed  COMPREHENSIVE METABOLIC PANEL - Abnormal; Notable for the following components:      Result Value   Potassium 3.3 (*)    Glucose, Bld 105 (*)    BUN 7 (*)    Albumin 3.3 (*)     All other components within normal limits  PHENOBARBITAL LEVEL - Abnormal; Notable for the following components:   Phenobarbital 44.5 (*)    All other components within normal limits  I-STAT CHEM 8, ED - Abnormal; Notable for the following components:   BUN 4 (*)    Glucose, Bld 102 (*)    Calcium, Ion 1.13 (*)    All other components within normal limits  CBG MONITORING, ED - Abnormal; Notable for the following components:   Glucose-Capillary 104 (*)    All other components within normal limits  ETHANOL  PROTIME-INR  APTT  CBC  DIFFERENTIAL  VALPROIC ACID LEVEL  RAPID URINE DRUG SCREEN, HOSP PERFORMED  URINALYSIS, ROUTINE W REFLEX MICROSCOPIC  I-STAT TROPONIN, ED   ____________________________________________  EKG   EKG Interpretation  Date/Time:  Saturday March 16 2018 13:08:16 EDT Ventricular Rate:  85 PR Interval:    QRS Duration: 77 QT Interval:  451 QTC Calculation: 537 R Axis:   12 Text Interpretation:  Sinus rhythm Anterior infarct, old Nonspecific T abnormalities, lateral leads Prolonged QT interval No STEMI  Confirmed by Nanda Quinton [id number]) on 04/01/2018 1:15:52 PM       ____________________________________________  RADIOLOGY  Ct Angio Head W Or Wo Contrast  Result Date: 04/03/2018 CLINICAL DATA:  Arterial stricture/occlusion of the head or neck. Left frontal meningioma. Craniotomy. Seizure disorder. Baseline aphasia and difficulty walking with progression beginning 3 hours ago. EXAM: CT ANGIOGRAPHY HEAD AND NECK TECHNIQUE: Multidetector CT imaging of the head and neck was performed using the standard protocol during bolus administration of intravenous contrast. Multiplanar CT image reconstructions and MIPs were obtained to evaluate the vascular anatomy. Carotid stenosis measurements (when applicable) are obtained utilizing NASCET criteria, using the distal internal carotid diameter as the denominator. CONTRAST:  81mL ISOVUE-370 IOPAMIDOL (ISOVUE-370)  INJECTION 76% COMPARISON:  CT head without contrast 04/04/2018. FINDINGS: CT HEAD FINDINGS Brain: Chronic bilateral occipital and parietal encephalomalacia is again noted. Left frontal craniotomy is present. Chronic encephalomalacia of the left operculum is stable. Mild white matter changes otherwise are stable. No acute infarct, hemorrhage, or mass lesion is present. Ventricles are proportionate to the degree of atrophy and encephalomalacia. Vascular: Atherosclerotic calcifications are present within cavernous internal carotid arteries bilaterally. Skull: Apart from a craniotomy, calvarium is intact. No focal lytic or blastic lesions are present. Sinuses: Paranasal sinuses and mastoid air cells are clear. Orbits: Globes and orbits are within normal limits bilaterally. Review of the MIP images confirms the above findings CTA NECK FINDINGS Aortic arch: There is a common origin of the left common carotid artery in the innominate artery. Atherosclerotic calcifications are present at the origin of the subclavian without significant stenosis. There is no aneurysm. Right carotid system: The right common carotid artery demonstrates distal atherosclerotic disease with calcified and noncalcified plaque. Mild narrowing is present without a significant stenosis relative to the more distal vessel. Additional atherosclerotic calcifications are present  at the right carotid bifurcation without a significant stenosis. There is some tortuosity of the cervical right ICA without significant stenosis. Left carotid system: The left common carotid artery is within normal limits proximally. Atherosclerotic disease is present in the distal common carotid artery through the bifurcation. Question previous endarterectomy. The cervical left ICA is tortuous without focal stenosis. Vertebral arteries: The left vertebral artery the dominant vessel. Significant stenosis is present within either vertebral artery in the neck. There is moderate  tortuosity bilaterally. Skeleton: Degenerative changes are present in the cervical spine. No focal lytic or blastic lesions are present. Slight degenerative anterolisthesis present C5-6. Alignment is otherwise anatomic. The patient is edentulous. Other neck: Soft tissues the neck are otherwise unremarkable. No focal mucosal or submucosal lesions are present. Salivary glands are normal. Vocal cords and thyroid are normal. Upper chest: The lung apices are clear.  Thoracic inlet is normal. Review of the MIP images confirms the above findings CTA HEAD FINDINGS Anterior circulation: Atherosclerotic calcifications are present within the cavernous internal carotid arteries. No significant stenosis is present. ICA termini are within normal limits. The A1 and M1 segments are normal. MCA bifurcations are intact. Anterior communicating artery is normal. Segmental irregularity is more prominent in the right than left MCA branch vessels. Mild proximal M2 segment narrowing is present just beyond the bifurcation. Moderate distal MCA branch vessel narrowing is also present. Posterior circulation: Vertebral arteries are within normal limits bilaterally. PICA origins are visualized and normal. Atherosclerotic calcifications are present vertebrobasilar junction without focal stenosis. Basilar tip is normal. Both posterior cerebral arteries originate from the basilar tip. PCA branch vessels are normal bilaterally. Venous sinuses: Dural sinuses are patent. Straight sinus and deep cerebral veins are intact. Cortical veins are unremarkable. Anatomic variants: None Delayed phase: Postcontrast images demonstrate no pathologic enhancement. Review of the MIP images confirms the above findings IMPRESSION: 1. M2 segment narrowing and branch vessel atherosclerotic disease in the right greater than left MCA branch vessels without a significant proximal stenosis or occlusion in the middle cerebral artery bilaterally. 2. Atherosclerotic changes at  both carotid bifurcations without significant stenosis. Question prior left carotid endarterectomy. 3. Atherosclerotic changes at the dural margin of the vertebral arteries and the vertebrobasilar junction without significant stenosis. 4. Chronic encephalomalacia involving the parietoccipital lobes bilaterally as well as the left frontal operculum. Left frontal craniotomy. No residual or recurrent meningioma. Electronically Signed   By: San Morelle M.D.   On: 04/05/2018 14:23   Ct Angio Neck W Or Wo Contrast  Result Date: 03/29/2018 CLINICAL DATA:  Arterial stricture/occlusion of the head or neck. Left frontal meningioma. Craniotomy. Seizure disorder. Baseline aphasia and difficulty walking with progression beginning 3 hours ago. EXAM: CT ANGIOGRAPHY HEAD AND NECK TECHNIQUE: Multidetector CT imaging of the head and neck was performed using the standard protocol during bolus administration of intravenous contrast. Multiplanar CT image reconstructions and MIPs were obtained to evaluate the vascular anatomy. Carotid stenosis measurements (when applicable) are obtained utilizing NASCET criteria, using the distal internal carotid diameter as the denominator. CONTRAST:  36mL ISOVUE-370 IOPAMIDOL (ISOVUE-370) INJECTION 76% COMPARISON:  CT head without contrast 04/01/2018. FINDINGS: CT HEAD FINDINGS Brain: Chronic bilateral occipital and parietal encephalomalacia is again noted. Left frontal craniotomy is present. Chronic encephalomalacia of the left operculum is stable. Mild white matter changes otherwise are stable. No acute infarct, hemorrhage, or mass lesion is present. Ventricles are proportionate to the degree of atrophy and encephalomalacia. Vascular: Atherosclerotic calcifications are present within cavernous internal carotid arteries bilaterally.  Skull: Apart from a craniotomy, calvarium is intact. No focal lytic or blastic lesions are present. Sinuses: Paranasal sinuses and mastoid air cells are  clear. Orbits: Globes and orbits are within normal limits bilaterally. Review of the MIP images confirms the above findings CTA NECK FINDINGS Aortic arch: There is a common origin of the left common carotid artery in the innominate artery. Atherosclerotic calcifications are present at the origin of the subclavian without significant stenosis. There is no aneurysm. Right carotid system: The right common carotid artery demonstrates distal atherosclerotic disease with calcified and noncalcified plaque. Mild narrowing is present without a significant stenosis relative to the more distal vessel. Additional atherosclerotic calcifications are present at the right carotid bifurcation without a significant stenosis. There is some tortuosity of the cervical right ICA without significant stenosis. Left carotid system: The left common carotid artery is within normal limits proximally. Atherosclerotic disease is present in the distal common carotid artery through the bifurcation. Question previous endarterectomy. The cervical left ICA is tortuous without focal stenosis. Vertebral arteries: The left vertebral artery the dominant vessel. Significant stenosis is present within either vertebral artery in the neck. There is moderate tortuosity bilaterally. Skeleton: Degenerative changes are present in the cervical spine. No focal lytic or blastic lesions are present. Slight degenerative anterolisthesis present C5-6. Alignment is otherwise anatomic. The patient is edentulous. Other neck: Soft tissues the neck are otherwise unremarkable. No focal mucosal or submucosal lesions are present. Salivary glands are normal. Vocal cords and thyroid are normal. Upper chest: The lung apices are clear.  Thoracic inlet is normal. Review of the MIP images confirms the above findings CTA HEAD FINDINGS Anterior circulation: Atherosclerotic calcifications are present within the cavernous internal carotid arteries. No significant stenosis is present.  ICA termini are within normal limits. The A1 and M1 segments are normal. MCA bifurcations are intact. Anterior communicating artery is normal. Segmental irregularity is more prominent in the right than left MCA branch vessels. Mild proximal M2 segment narrowing is present just beyond the bifurcation. Moderate distal MCA branch vessel narrowing is also present. Posterior circulation: Vertebral arteries are within normal limits bilaterally. PICA origins are visualized and normal. Atherosclerotic calcifications are present vertebrobasilar junction without focal stenosis. Basilar tip is normal. Both posterior cerebral arteries originate from the basilar tip. PCA branch vessels are normal bilaterally. Venous sinuses: Dural sinuses are patent. Straight sinus and deep cerebral veins are intact. Cortical veins are unremarkable. Anatomic variants: None Delayed phase: Postcontrast images demonstrate no pathologic enhancement. Review of the MIP images confirms the above findings IMPRESSION: 1. M2 segment narrowing and branch vessel atherosclerotic disease in the right greater than left MCA branch vessels without a significant proximal stenosis or occlusion in the middle cerebral artery bilaterally. 2. Atherosclerotic changes at both carotid bifurcations without significant stenosis. Question prior left carotid endarterectomy. 3. Atherosclerotic changes at the dural margin of the vertebral arteries and the vertebrobasilar junction without significant stenosis. 4. Chronic encephalomalacia involving the parietoccipital lobes bilaterally as well as the left frontal operculum. Left frontal craniotomy. No residual or recurrent meningioma. Electronically Signed   By: San Morelle M.D.   On: 04/05/2018 14:23   Ct Head Code Stroke Wo Contrast  Result Date: 03/30/2018 CLINICAL DATA:  Code stroke. Weakness and slurred speech. History of stroke EXAM: CT HEAD WITHOUT CONTRAST TECHNIQUE: Contiguous axial images were obtained  from the base of the skull through the vertex without intravenous contrast. COMPARISON:  11/20/2017 FINDINGS: Brain: No evidence of acute infarction, hemorrhage, hydrocephalus, extra-axial collection  or mass lesion/mass effect. Remote right more than left occipital infarction with volume loss. Remote lateral left frontal infarct that is stable from prior. Chronic small vessel ischemic gliosis in the cerebral white matter. Dilated perivascular space below the right putamen. Vascular: No hyperdense vessel or unexpected calcification. Skull: Prior left-sided craniotomy. Sinuses/Orbits: No acute finding Other: These results were called by telephone at the time of interpretation on 03/26/2018 at 1:06 pm to Dr. Vonna Kotyk Ariani Seier via Dr Sabra Heck. ASPECTS [facility] Stroke Program Early CT Score) Not scored given the extent of chronic change IMPRESSION: 1. No acute finding when compared to prior. 2. Remote right more than left occipital infarction. Remote lateral left frontal infarction. 3. Moderate to advanced motion degradation. Electronically Signed   By: Monte Fantasia M.D.   On: 03/26/2018 13:07    ____________________________________________   PROCEDURES  Procedure(s) performed:   .Critical Care Performed by: Margette Fast, MD Authorized by: Margette Fast, MD   Critical care provider statement:    Critical care time (minutes):  60   Critical care time was exclusive of:  Separately billable procedures and treating other patients and teaching time   Critical care was necessary to treat or prevent imminent or life-threatening deterioration of the following conditions:  CNS failure or compromise   Critical care was time spent personally by me on the following activities:  Blood draw for specimens, development of treatment plan with patient or surrogate, discussions with consultants, evaluation of patient's response to treatment, examination of patient, obtaining history from patient or surrogate, ordering and  performing treatments and interventions, ordering and review of laboratory studies, ordering and review of radiographic studies, pulse oximetry, re-evaluation of patient's condition, review of old charts and ventilator management   I assumed direction of critical care for this patient from another provider in my specialty: no   Date/Time: 03/20/2018 3:34 PM Performed by: Margette Fast, MD Pre-anesthesia Checklist: Patient identified, Emergency Drugs available, Suction available, Patient being monitored and Timeout performed Oxygen Delivery Method: Ambu bag Preoxygenation: Pre-oxygenation with 100% oxygen Induction Type: Rapid sequence Ventilation: Mask ventilation without difficulty Laryngoscope Size: Glidescope and 3 Grade View: Grade I Tube size: 7.5 mm Number of attempts: 1 Airway Equipment and Method: Video-laryngoscopy Placement Confirmation: ETT inserted through vocal cords under direct vision,  CO2 detector and Breath sounds checked- equal and bilateral Secured at: 22 cm Tube secured with: ETT holder Dental Injury: Teeth and Oropharynx as per pre-operative assessment         ____________________________________________   INITIAL IMPRESSION / ASSESSMENT AND PLAN / ED COURSE  Pertinent labs & imaging results that were available during my care of the patient were reviewed by me and considered in my medical decision making (see chart for details).  Patient arrives to the emergency department somewhat altered and aphasic.  She does seem to have some right upper extremity weakness.  Has history of stroke in the past.  Unclear if patient has baseline deficits from that stroke at this time.  Patient's dentures are loose and were removed prior to going for CT. Otherwise patient's airway is intact and ready for imaging. Tele-neuro to evaluate.   01:45 PM Spoke with Tele-neuro and reviewed consult note. Suspect seizure here. On my re-evaluation the patient is now having right face  twitching which seems more consistent with seizure and different from her arrival exam. Plan for Keppra, CTA, and reassess.   02:30 PM T NGO resulted.  Will call to discuss with tele-neurology.  The patient  continues to have some twitching of the right face.  Suspect that she is in nonconvulsive status at this time.  Will talk with tele-neurology about additional meds versus intubation and deeper sedation.  I discussed this with the patient's husband at bedside.  During the April admission the patient was made DNR.  We discussed this and the patient states that if intubation is needed for seizure treatment that he would want that to be done despite her DNR status.   02:50 PM Patient intubated without complication on 1st pass. Will discuss with [facility] at [facility] and patient will need transfer for EEG and ICU care.   Spoke with Dr. Leonel Ramsay who has no further AED recommendations at this time but will need rapid transport to [facility]. Spoke with Dr. Debbora Dus with critical care who accepts the patient in transport.   Discussed patient's case with ICU, Dr. Debbora Dus to request admission. Patient and family (if present) updated with plan. Care transferred to ICU service.  I reviewed all nursing notes, vitals, pertinent old records, EKGs, labs, imaging (as available).  ____________________________________________  FINAL CLINICAL IMPRESSION(S) / ED DIAGNOSES  Final diagnoses:  Status epilepticus ([place])     MEDICATIONS GIVEN DURING THIS VISIT:  Medications  rocuronium (ZEMURON) injection 1 mg/kg (50 mg Intravenous Not Given 03/29/2018 1552)  propofol (DIPRIVAN) 10 mg/mL bolus/IV push 0.5 mg/kg ( Intravenous Not Given 03/13/2018 1505)  propofol (DIPRIVAN) 1000 MG/100ML infusion (40 mcg/kg/min Intravenous Rate/Dose Change 03/15/2018 1506)  LORazepam (ATIVAN) 2 MG/ML injection (1 mg  Given 03/10/2018 1315)  LORazepam (ATIVAN) injection 1 mg (1 mg Intravenous Given 03/26/2018 1319)  levETIRAcetam (KEPPRA) 1000  MG/100ML IVPB (  Stopped 03/27/2018 1436)  iopamidol (ISOVUE-370) 76 % injection 100 mL (75 mLs Intravenous Contrast Given 04/06/2018 1346)  propofol (DIPRIVAN) 10 mg/mL bolus/IV push (25 mg Intravenous Given 03/14/2018 1449)  rocuronium (ZEMURON) injection (50 mg Intravenous Given 03/30/2018 1452)    Note:  This document was prepared using Dragon voice recognition software and may include unintentional dictation errors.  Nanda Quinton, MD Emergency Medicine    Mylin Gignac, [facility], MD 03/31/2018 1537
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
## 2018-03-17 ENCOUNTER — Inpatient Hospital Stay (HOSPITAL_COMMUNITY): Payer: Medicare HMO

## 2018-03-17 DIAGNOSIS — J9601 Acute respiratory failure with hypoxia: Secondary | ICD-10-CM

## 2018-03-17 LAB — CBC
HCT: 37.3 % (ref 36.0–46.0)
Hemoglobin: 12.8 g/dL (ref 12.0–15.0)
MCH: 31.8 pg (ref 26.0–34.0)
MCHC: 34.3 g/dL (ref 30.0–36.0)
MCV: 92.8 fL (ref 78.0–100.0)
PLATELETS: 221 10*3/uL (ref 150–400)
RBC: 4.02 MIL/uL (ref 3.87–5.11)
RDW: 13.9 % (ref 11.5–15.5)
WBC: 10.7 10*3/uL — AB (ref 4.0–10.5)

## 2018-03-17 LAB — MAGNESIUM
MAGNESIUM: 1.7 mg/dL (ref 1.7–2.4)
Magnesium: 1.6 mg/dL — ABNORMAL LOW (ref 1.7–2.4)
Magnesium: 1.7 mg/dL (ref 1.7–2.4)

## 2018-03-17 LAB — BASIC METABOLIC PANEL
ANION GAP: 13 (ref 5–15)
BUN: 6 mg/dL — ABNORMAL LOW (ref 8–23)
CALCIUM: 8.9 mg/dL (ref 8.9–10.3)
CO2: 28 mmol/L (ref 22–32)
Chloride: 101 mmol/L (ref 98–111)
Creatinine, Ser: 0.63 mg/dL (ref 0.44–1.00)
GLUCOSE: 90 mg/dL (ref 70–99)
Potassium: 4.2 mmol/L (ref 3.5–5.1)
SODIUM: 142 mmol/L (ref 135–145)

## 2018-03-17 LAB — GLUCOSE, CAPILLARY
GLUCOSE-CAPILLARY: 118 mg/dL — AB (ref 70–99)
GLUCOSE-CAPILLARY: 116 mg/dL — AB (ref 70–99)
GLUCOSE-CAPILLARY: 176 mg/dL — AB (ref 70–99)
GLUCOSE-CAPILLARY: 115 mg/dL — AB (ref 70–99)
Glucose-Capillary: 205 mg/dL — ABNORMAL HIGH (ref 70–99)

## 2018-03-17 LAB — PHOSPHORUS
PHOSPHORUS: 3.2 mg/dL (ref 2.5–4.6)
PHOSPHORUS: 3.5 mg/dL (ref 2.5–4.6)
Phosphorus: 3.5 mg/dL (ref 2.5–4.6)

## 2018-03-17 MED ORDER — SODIUM CHLORIDE 0.9 % IV SOLN
200.0000 mg | Freq: Two times a day (BID) | INTRAVENOUS | Status: DC
  Administered 2018-03-18 – 2018-03-21 (×7): 200 mg via INTRAVENOUS
  Filled 2018-03-17 (×9): qty 20

## 2018-03-17 MED ORDER — SODIUM CHLORIDE 0.9 % IV SOLN
250.0000 mL | INTRAVENOUS | Status: DC | PRN
  Administered 2018-03-17: 1000 mL via INTRAVENOUS
  Administered 2018-03-18: 250 mL via INTRAVENOUS

## 2018-03-17 MED ORDER — PRO-STAT SUGAR FREE PO LIQD
30.0000 mL | Freq: Two times a day (BID) | ORAL | Status: DC
  Administered 2018-03-17: 30 mL
  Filled 2018-03-17: qty 30

## 2018-03-17 MED ORDER — PRO-STAT SUGAR FREE PO LIQD
30.0000 mL | Freq: Every day | ORAL | Status: DC
  Administered 2018-03-17 – 2018-03-21 (×5): 30 mL
  Filled 2018-03-17 (×5): qty 30

## 2018-03-17 MED ORDER — LEVETIRACETAM IN NACL 1000 MG/100ML IV SOLN
1000.0000 mg | Freq: Two times a day (BID) | INTRAVENOUS | Status: DC
  Administered 2018-03-17 – 2018-03-18 (×2): 1000 mg via INTRAVENOUS
  Filled 2018-03-17 (×2): qty 100

## 2018-03-17 MED ORDER — INSULIN ASPART 100 UNIT/ML ~~LOC~~ SOLN
0.0000 [IU] | SUBCUTANEOUS | Status: DC
  Administered 2018-03-17: 3 [IU] via SUBCUTANEOUS
  Administered 2018-03-18 – 2018-03-21 (×4): 1 [IU] via SUBCUTANEOUS

## 2018-03-17 MED ORDER — VITAL 1.5 CAL PO LIQD
1000.0000 mL | ORAL | Status: DC
  Administered 2018-03-17 – 2018-03-20 (×4): 1000 mL
  Filled 2018-03-17 (×5): qty 1000

## 2018-03-17 MED ORDER — LEVETIRACETAM IN NACL 1500 MG/100ML IV SOLN: 1500.0000 mg | Freq: Two times a day (BID) | INTRAVENOUS | Status: DC

## 2018-03-17 MED ORDER — VITAL HIGH PROTEIN PO LIQD
1000.0000 mL | ORAL | Status: DC
  Administered 2018-03-17: 1000 mL
  Filled 2018-03-17: qty 1000

## 2018-03-17 MED ORDER — FREE WATER
200.0000 mL | Freq: Four times a day (QID) | Status: DC
  Administered 2018-03-17 – 2018-03-21 (×16): 200 mL

## 2018-03-17 MED ORDER — ASPIRIN 81 MG PO CHEW
162.0000 mg | CHEWABLE_TABLET | Freq: Every evening | ORAL | Status: DC
  Administered 2018-03-17 – 2018-03-20 (×4): 162 mg
  Filled 2018-03-17 (×4): qty 2

## 2018-03-17 MED ORDER — SODIUM CHLORIDE 0.9 % IV SOLN
200.0000 mg | INTRAVENOUS | Status: AC
  Administered 2018-03-17: 200 mg via INTRAVENOUS
  Filled 2018-03-17: qty 20

## 2018-03-17 MED ORDER — LORAZEPAM 2 MG/ML IJ SOLN
INTRAMUSCULAR | Status: AC
  Administered 2018-03-17: 1 mg via INTRAVENOUS
  Filled 2018-03-17: qty 1

## 2018-03-17 MED ORDER — SODIUM CHLORIDE 0.9 % IV SOLN
100.0000 mg | Freq: Two times a day (BID) | INTRAVENOUS | Status: DC
  Filled 2018-03-17: qty 10

## 2018-03-17 MED ORDER — LORAZEPAM 2 MG/ML IJ SOLN
1.0000 mg | Freq: Once | INTRAMUSCULAR | Status: AC
  Administered 2018-03-17: 1 mg via INTRAVENOUS

## 2018-03-17 NOTE — Progress Notes (Signed)
EEG with periodic activity on the left, concern for continued cortical irritability.   1) add Vimpat 200 mg every 12 hours 2) continue Keppra 1500 twice daily 3) continue home dose of Depakote and phenobarbital levels tomorrow  Roland Rack, MD [facility] [phone]  If 7pm- 7am, please page neurology on call as listed in [place].
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
## 2018-03-17 NOTE — Plan of Care (Signed)
Tube feed initiated today

## 2018-03-17 NOTE — Progress Notes (Signed)
SLP Cancellation Note  Patient Details Name: [name] MRN: [id number] DOB: 10/23/1945   Cancelled treatment:       Reason Eval/Treat Not Completed: Medical issues which prohibited therapy. Per chart pt remains intubated. Will f/u for readiness.    Angelique Chevalier, Katherene Ponto 03/17/2018, 8:27 AM

## 2018-03-17 NOTE — H&P (Signed)
PULMONARY / CRITICAL CARE MEDICINE   Name: [name] MRN: [id number] DOB: [birth year]    ADMISSION DATE:  04/04/2018 CONSULTATION DATE:  03/17/2018  REFERRING MD:  APED  CHIEF COMPLAINT: Seizures  HISTORY OF PRESENT ILLNESS:   72 year old woman with hypertension, prior CVA and seizure disorder brought in by EMS to any pain ED as a code stroke with acute onset weakness on the right side, last seen normal 12:10 PM.  At baseline she has left-sided weakness and aphasia, per report. She was evaluated by tele neurology .  CT did not show any evidence of stroke or large vessel occlusion.  Right face twitching was noted which did not improve with Ativan and Keppra.  With the suspicion of nonconvulsive status, she was intubated and started on propofol and transferred to [facility].  Transport was uneventful  PAST MEDICAL HISTORY :  She  has a past medical history of Aphasia, GI bleed (12/01/2016), Hypertension, Seizures ([place]), Stroke [facility]) (2009, 2015 9/27, 11/2014), and Thyroid disease.  PAST SURGICAL HISTORY: She  has a past surgical history that includes PEG tube placement; PEG tube removal; Abdominal hysterectomy; Craniotomy (Left, 06/18/2015); and Colonoscopy (N/A, 12/03/2016).  Allergies  Allergen Reactions  . Codeine Nausea And Vomiting and Rash    No current facility-administered medications on file prior to encounter.    Current Outpatient Medications on File Prior to Encounter  Medication Sig  . acetaminophen (TYLENOL) 325 MG tablet Take 650 mg by mouth every 6 (six) hours as needed.  Marland Kitchen amitriptyline (ELAVIL) 10 MG tablet Take 20 mg by mouth at bedtime.  Marland Kitchen amLODipine (NORVASC) 2.5 MG tablet Take 2.5 mg along with 5 mg to = 7.5 mg once a day  . amLODipine (NORVASC) 5 MG tablet Take 5 mg along with 2.5 mg to = 7.5 mg once a day  . aspirin EC 81 MG EC tablet Take 2 tablets (162 mg total) by mouth every evening.  Marland Kitchen atorvastatin (LIPITOR) 40 MG tablet Take 40 mg by mouth every evening.    Roseanne Kaufman Peru-Castor Oil (VENELEX) OINT Apply to sacrum and bilateral buttocks q shift for prevention and incontinence or erythema every shift  . cholecalciferol (VITAMIN D) 400 units TABS tablet Take 400 Units by mouth daily.  . cloNIDine (CATAPRES) 0.1 MG tablet Take 0.1 mg by mouth daily.  Marland Kitchen docusate (COLACE) 50 MG/5ML liquid Take 100 mg by mouth daily.  Marland Kitchen levothyroxine (SYNTHROID, LEVOTHROID) 25 MCG tablet Take 25 mcg by mouth daily before breakfast.  . LORazepam (ATIVAN) 0.5 MG tablet Take 0.25 mg by mouth every 6 (six) hours as needed for anxiety.   Marland Kitchen PHENobarbital (LUMINAL) 97.2 MG tablet Take 1 tablet (97.2 mg total) by mouth at bedtime.  Marland Kitchen QUEtiapine (SEROQUEL) 25 MG tablet Take 25 mg by mouth at bedtime.  . valproic acid (DEPAKENE) 250 MG capsule Take 2 capsules (500 mg total) by mouth 3 (three) times daily.    FAMILY HISTORY:  Her family history includes Colon cancer in her sister; Coronary artery disease in her unknown relative; Stroke in her father; Sudden death in her father; Ulcers in her mother.  SOCIAL HISTORY: She  reports that she has been smoking cigarettes. She has a 75.00 pack-year smoking history. She has never used smokeless tobacco. She reports that she does not drink alcohol or use drugs.  REVIEW OF SYSTEMS:   Unable to obtain since unresponsive  SUBJECTIVE:   Remains unresponsive to verbal stimulation. Continues to have intermittent facial twitching.  VITAL SIGNS:  BP (!) 149/91   Pulse 98   Temp 98.6 F (37 C) (Axillary)   Resp (!) 21   Ht 5\' 4"  (1.626 m)   Wt 47.8 kg   SpO2 100%   BMI 18.09 kg/m   HEMODYNAMICS:  On no vasopressors.  VENTILATOR SETTINGS: Vent Mode: PRVC FiO2 (%):  [30 %-40 %] 30 % Set Rate:  [15 bmp] 15 bmp Vt Set:  [400 mL-430 mL] 430 mL PEEP:  [5 cmH20] 5 cmH20 Plateau Pressure:  [14 cmH20-20 cmH20] 20 cmH20  INTAKE / OUTPUT: I/O last 3 completed shifts: In: 391.4 [I.V.:81.4; IV Piggyback:310] Out: 335 [Urine:285;  Emesis/NG output:50]  PHYSICAL EXAMINATION: General: Poorly nourished, chronically ill-appearing, sedated on propofol Neuro:  RASS-3, does not follow commands, pupils 3 mm bilaterally reactive to light, continued right facial twitching, flexor response to painful stimulation all 4 limbs. HEENT: No pallor, icterus or JVD, oral ET tube Cardiovascular: S1-S2 normal, sinus on monitor.  Extremities warm and well-perfused. Lungs: Clear breath sounds, no rhonchi Abdomen: Soft and nontender Musculoskeletal: Left forearm and hand contracture with some wasting Skin: No rash  LABS:  BMET Recent Labs  Lab 03/20/2018 1256 03/10/2018 1313 03/17/18 0631  NA 142 144 142  K 3.3* 3.6 4.2  CL 103 105 101  CO2 31  --  28  BUN 7* 4* 6*  CREATININE 0.58 0.60 0.63  GLUCOSE 105* 102* 90    Electrolytes Recent Labs  Lab 03/09/2018 1256 03/17/18 0631 03/17/18 1116  CALCIUM 9.1 8.9  --   MG  --  1.7 1.6*  PHOS  --  3.5 3.2    CBC Recent Labs  Lab 03/17/2018 1256 04/03/2018 1313 03/17/18 0631  WBC 7.0  --  10.7*  HGB 13.4 13.6 12.8  HCT 39.1 40.0 37.3  PLT 224  --  221    Coag's Recent Labs  Lab 03/15/2018 1256  APTT 29  INR 1.06    Sepsis Markers No results for input(s): LATICACIDVEN, PROCALCITON, O2SATVEN in the last 168 hours.  ABG Recent Labs  Lab 03/10/2018 1625  PHART 7.434  PCO2ART 43.4  PO2ART 221*    Liver Enzymes Recent Labs  Lab 03/11/2018 1256  AST 31  ALT 23  ALKPHOS 51  BILITOT 0.4  ALBUMIN 3.3*    Cardiac Enzymes No results for input(s): TROPONINI, PROBNP in the last 168 hours.  Glucose Recent Labs  Lab 03/29/2018 1307 03/17/18 1153 03/17/18 1615  GLUCAP 104* 118* 116*    Imaging Dg Chest Port 1 View  Result Date: 03/17/2018 CLINICAL DATA:  Acute onset of respiratory failure. EXAM: PORTABLE CHEST 1 VIEW COMPARISON:  Chest radiograph performed 03/11/2018 FINDINGS: The patient's endotracheal tube is seen ending 3 cm above the carina. An enteric tube is  noted extending below the diaphragm. The lungs are well-aerated and clear. There is no evidence of focal opacification, pleural effusion or pneumothorax. The cardiomediastinal silhouette is within normal limits. No acute osseous abnormalities are seen. IMPRESSION: 1. Endotracheal tube seen ending 3 cm above the carina. 2. No acute cardiopulmonary process seen. Electronically Signed   By: Garald Balding M.D.   On: 03/17/2018 05:53   Dg Abd Portable 1v  Result Date: 03/25/2018 CLINICAL DATA:  Evaluate OG tube placement. EXAM: PORTABLE ABDOMEN - 1 VIEW COMPARISON:  None. FINDINGS: The OG tube terminates in the right mid abdomen, likely in the distal stomach or proximal duodenum. Calcifications in the right upper quadrant are consistent with cholelithiasis. There is contrast in the bladder. No  other acute abnormalities. IMPRESSION: 1. The distal tip of the OG tube is in the right mid abdomen, likely in the distal stomach or proximal duodenum. 2. Cholelithiasis. Electronically Signed   By: Dorise Bullion III M.D   On: 03/25/2018 18:22     STUDIES:  CT head angio 8/10 >> diffuse atherosclerosis, chronic encephalomalacia, no large vessel occlusion, right M2 narrowing  CULTURES:   ANTIBIOTICS:   SIGNIFICANT EVENTS: 8/10 tr from [facility]  LINES/TUBES: ETT 8/10 >>  DISCUSSION: Appears to be nonconvulsive status with facial twitching, now with propofol coma Unclear compliance with phenobarbital and valproic  ASSESSMENT / PLAN:  PULMONARY A: Acute respiratory failure due to inability to protect airway. P:   Continue current ventilatory support and commence SBT once neurological status improves.  CARDIOVASCULAR A:  Hypertension P:  Resume amlodipine and clonidine via OG tube Use hydralazine as needed  RENAL A:   Mild hypokalemia P:   Replete  GASTROINTESTINAL A:   No issues P:   Anticipate prolonged intubation.  High nutritional risk.  Initiate tube feeds. Protonix for stress  ulcer prophylaxis  HEMATOLOGIC A:   No issues P:  Subcu Lovenox for DVT prophylaxis  INFECTIOUS A:   No issues P:     ENDOCRINE A:   No issues, at risk hypoglycemia P:   Initiate tube feeds continue CBGs.  NEUROLOGIC A:   Nonconvulsive status epilepticus P:   RASS goal: -2 Continues to have breakthrough focal seizures despite propofol.  Severity seems to be somewhat diminished.  Remains comatose even off propofol. Continue current anti-convulsant regimen. Prognosis is guarded given seizures in the context of pre-existing brain disease. Hold Seroquel and amitriptyline for now   FAMILY  - Updates: No family at bedside, defer to neurology - Inter-disciplinary family meet or Palliative Care meeting due by:  day 7  CRITICAL CARE Performed by: Kipp Brood   Total critical care time: 40 minutes  Critical care time was exclusive of separately billable procedures and treating other patients.  Critical care was necessary to treat or prevent imminent or life-threatening deterioration.  Critical care was time spent personally by me on the following activities: development of treatment plan with patient and/or surrogate as well as nursing, discussions with consultants, evaluation of patient's response to treatment, examination of patient, obtaining history from patient or surrogate, ordering and performing treatments and interventions, ordering and review of laboratory studies, ordering and review of radiographic studies, pulse oximetry and re-evaluation of patient's condition.  Kipp Brood, MD [facility] ICU Physician [place]  Pager: [phone] Mobile: [phone] After hours: [phone].  03/17/2018, 5:57 PM

## 2018-03-17 NOTE — Progress Notes (Signed)
CODE STROKE 1247 CALL TIME 1249 BEEPER 1250 [place]

## 2018-03-17 NOTE — Progress Notes (Signed)
[facility] Progress Note Patient Name: [name] DOB: 1945/09/09 MRN: [id number]   Date of Service  03/17/2018  HPI/Events of Note  Hyperglycemia - Blood glucose = 176. Patient is NPO. Patient is on enteral nutrition.   eICU Interventions  Will order: 1. Q 4 hour sensitive Novolog SSI.     Intervention Category Major Interventions: Hyperglycemia - active titration of insulin therapy  Lysle Dingwall 03/17/2018, 9:34 PM

## 2018-03-17 NOTE — Progress Notes (Signed)
Subjective: No clear seizures overnight, however, had some jaw twitching while I was in the room.   Exam: Vitals:   03/17/18 0800 03/17/18 0815  BP: (!) 170/100 (!) 170/100  Pulse: 82 82  Resp: 19 20  Temp: 97.8 F (36.6 C)   SpO2: 100% 100%   Gen: In bed, intubated Resp: non-labored breathing, no acute distress Abd: soft, nt  Neuro: MS: does not open eyes or follow commands, but does grimace to noxious stimulaiton.  YP:PJKDT, eyes are dysconjugate, but she appears to try to look left wards when nox stim is applied on theat side.  Motor: withdrawal x4, brisker response than yesterday, chronic left paresis.  Sensory:as above.   Pertinent Labs: BMP - unremarkable.   Impression: 72 year old female who presented with partial status epilepticus which resolved by the time of arrival.  She appeared to have a focal motor seizure while he was in the room this morning  Recommendations: 1) increase Keppra 1 g twice daily 2) overnight EEG to ensure she is not having frequent subtle clinical seizures.  This patient is critically ill and at significant risk of neurological worsening, death and care requires constant monitoring of vital signs, hemodynamics,respiratory and cardiac monitoring, neurological assessment, discussion with family, other specialists and medical decision making of high complexity. I spent 45 minutes of neurocritical care time  in the care of  this patient.  Roland Rack, MD [facility] [phone]  If 7pm- 7am, please page neurology on call as listed in [place]. 03/17/2018  4:41 PM

## 2018-03-17 NOTE — Progress Notes (Signed)
LTM running - no initial skin breakdown. 

## 2018-03-18 LAB — PHENOBARBITAL LEVEL: Phenobarbital: 39.2 ug/mL — ABNORMAL HIGH (ref 15.0–30.0)

## 2018-03-18 LAB — GLUCOSE, CAPILLARY
GLUCOSE-CAPILLARY: 114 mg/dL — AB (ref 70–99)
GLUCOSE-CAPILLARY: 113 mg/dL — AB (ref 70–99)
GLUCOSE-CAPILLARY: 110 mg/dL — AB (ref 70–99)
GLUCOSE-CAPILLARY: 120 mg/dL — AB (ref 70–99)
GLUCOSE-CAPILLARY: 108 mg/dL — AB (ref 70–99)
Glucose-Capillary: 125 mg/dL — ABNORMAL HIGH (ref 70–99)

## 2018-03-18 LAB — VALPROIC ACID LEVEL: Valproic Acid Lvl: 80 ug/mL (ref 50.0–100.0)

## 2018-03-18 LAB — PHOSPHORUS
Phosphorus: 3.6 mg/dL (ref 2.5–4.6)
Phosphorus: 2.9 mg/dL (ref 2.5–4.6)

## 2018-03-18 LAB — MAGNESIUM
MAGNESIUM: 1.6 mg/dL — AB (ref 1.7–2.4)
Magnesium: 2.4 mg/dL (ref 1.7–2.4)

## 2018-03-18 MED ORDER — SODIUM CHLORIDE 0.9 % IV SOLN: 250.0000 mL | INTRAVENOUS | Status: DC | PRN

## 2018-03-18 MED ORDER — MIDAZOLAM HCL 2 MG/2ML IJ SOLN
INTRAMUSCULAR | Status: AC
  Filled 2018-03-18: qty 6

## 2018-03-18 MED ORDER — LEVOTHYROXINE SODIUM 25 MCG PO TABS
25.0000 ug | ORAL_TABLET | Freq: Every day | ORAL | Status: DC
  Administered 2018-03-19 – 2018-03-21 (×3): 25 ug
  Filled 2018-03-18 (×3): qty 1

## 2018-03-18 MED ORDER — SODIUM CHLORIDE 0.9 % IV SOLN
INTRAVENOUS | Status: DC
  Administered 2018-03-18 – 2018-03-21 (×4): via INTRAVENOUS

## 2018-03-18 MED ORDER — MAGNESIUM SULFATE 2 GM/50ML IV SOLN
2.0000 g | Freq: Once | INTRAVENOUS | Status: AC
  Administered 2018-03-18: 2 g via INTRAVENOUS
  Filled 2018-03-18: qty 50

## 2018-03-18 MED ORDER — LEVETIRACETAM IN NACL 1500 MG/100ML IV SOLN
1500.0000 mg | Freq: Two times a day (BID) | INTRAVENOUS | Status: DC
  Administered 2018-03-18 – 2018-03-21 (×6): 1500 mg via INTRAVENOUS
  Filled 2018-03-18 (×8): qty 100

## 2018-03-18 MED ORDER — MIDAZOLAM HCL 2 MG/2ML IJ SOLN
5.0000 mg | Freq: Once | INTRAMUSCULAR | Status: AC
  Administered 2018-03-18: 5 mg via INTRAVENOUS
  Filled 2018-03-18: qty 6

## 2018-03-18 MED ORDER — PHENOBARBITAL 32.4 MG PO TABS
64.8000 mg | ORAL_TABLET | Freq: Every day | ORAL | Status: DC
  Administered 2018-03-18: 64.8 mg via ORAL
  Filled 2018-03-18: qty 2

## 2018-03-18 MED ORDER — PHENOBARBITAL 64.8 MG PO TABS: 64.8000 mg | ORAL_TABLET | Freq: Every day | ORAL | Status: DC

## 2018-03-18 NOTE — Progress Notes (Signed)
LTM EEG checked, no skin breakdown at Fp1 or Fp2. Paste added to P3, Pz and O2

## 2018-03-18 NOTE — Progress Notes (Signed)
SLP Cancellation Note  Patient Details Name: [name] MRN: [id number] DOB: 1946/02/20   Cancelled treatment:        Pt intubated. Will follow pt's progress   Houston Siren 03/18/2018, 7:58 AM    Orbie Pyo Colvin Caroli.Ed [employer] [phone]

## 2018-03-18 NOTE — Procedures (Signed)
LTM-EEG Report  HISTORY: Continuous video-EEG monitoring performed for 72 year old patient with partial status epilepticus. ACQUISITION: International 10-20 system for electrode placement; 18 channels with additional eyes linked to ipsilateral ears and EKG. Additional T1-T2 electrodes were used. Continuous video recording obtained.   EEG NUMBER:  MEDICATIONS:  Day1: LEV, LCM, VPA, PHB  DAY #1: from 1158 03/17/18 to 0730 03/18/18 BACKGROUND: Anoverall medium voltage, continuous recording withsomespontaneous variability and reactivity. The backgroundconsisted of medium voltage, semirhythmic theta-delta activity bilaterally with sparse superimposed faster frequencies. As sedation was increased, there was slowing and attenuation of the background. Activity over the left posterior region was generally slower than the right. EPILEPTIFORM/PERIODIC ACTIVITY: There were frequent runs of left posterior (P3 maximal) lateralized periodic discharges (LPDs) with spike morphology and overriding fast activity, frequency 1-2Hz , with some discrete ictal evolution as below. The LPDs were slightly improved transient in the afternoon with increased AEDs, however returned and persisted over the late evening hours.  SEIZURES: One well-defined electrographic/subtle clinical seizure (1700pm) arising from left posterior LPDs with buildup of 8hz  ictal spiking in the left posterior then left hemispheric region over 90 seconds. Some subtle jaw twitching was seen with this event. EVENTS:Subtle clinical seizure with jaw twitching as above.   EKG: no significant arrhythmia  SUMMARY: This was an abnormal continuous video EEG due to left posterior LPDs with epileptiform morphology and focal electrographic or subtle clinical seizures. The last definite seizure occurred around 1700 prior to administration of lacosamide. The LPDs have persisted overnight with overriding fast activity, worrisome for an ongoing focal  epileptogenic disturbance.

## 2018-03-18 NOTE — Progress Notes (Signed)
Discussed with Dr. Rory Percy rate of propofol gtt, he instructed to keep as is, 20 mcg/kg/min.

## 2018-03-18 NOTE — Progress Notes (Addendum)
Subjective: No response other than to noxious stimuli.  Patient is currently on 20 mics per kilogram per minute.  Exam: Vitals:   03/18/18 0839 03/18/18 0900  BP: 121/80 (!) 147/87  Pulse: 66 70  Resp: 15 15  Temp:    SpO2: 100% 100%   Physical Exam  HEENT-  Normocephalic, no lesions, without obvious abnormality.  Normal external eye and conjunctiva.   Extremities- Warm,  Musculoskeletal-no joint tenderness, deformity or swelling Skin-warm and dry, Neuro:  Mental Status: Currently intubated, on significant propofol and sedated.  Responses to only noxious stimuli Cranial Nerves: II: No blink to threat III,IV, VI: Doll's intact.  Pupils 2 mm and sluggish V,VII: smile symmetric, facial light touch sensation normal bilaterally Motor: No response to noxious stimuli in the upper extremities, bilateral lower extremities withdraws from pain Sensory: Winces to noxious stimuli in the upper extremities and withdraws to noxious stimuli in the lower extremities along with wincing  Medications:  Scheduled: . amLODipine  5 mg Per Tube Daily  . aspirin  162 mg Per Tube QPM  . chlorhexidine gluconate (MEDLINE KIT)  15 mL Mouth Rinse BID  . cloNIDine  0.1 mg Per Tube Daily  . enoxaparin (LOVENOX) injection  40 mg Subcutaneous Q24H  . feeding supplement (PRO-STAT SUGAR FREE 64)  30 mL Per Tube Daily  . feeding supplement (VITAL 1.5 CAL)  1,000 mL Per Tube Q24H  . free water  200 mL Per Tube Q6H  . insulin aspart  0-9 Units Subcutaneous Q4H  . [START ON 03/19/2018] levothyroxine  25 mcg Per Tube Daily  . mouth rinse  15 mL Mouth Rinse 10 times per day  . midazolam  5 mg Intravenous Once  . pantoprazole sodium  40 mg Per Tube Daily  . phenobarbital  97.2 mg Oral QHS    Pertinent Labs/Diagnostics:  LTM-EEG report: This was an abnormal continuous video EEG due to left posterior LPDs with epileptiform morphology and focal electrographic or subtle clinical seizures. The last definite seizure  occurred around 1700 prior to administration of lacosamide. The LPDs have persisted overnight with overriding fast activity, worrisome for an ongoing focal epileptogenic disturbance.  No barbital is supratherapeutic at 39.2 Valproic acid 80  Etta Quill PA-C [facility] [phone]  Assessment:  72 year old female with history of seizure presenting with focal status epilepticus.  Currently on phenobarbital, Depakote, Vimpat, and Keppra.   cEEG shows continuing focal epileptogenicity with last seizure last evening. Might need more AED   Recommendations: -We will increase Keppra to 1500 mg seeing that patient continues to have LPDs have persisted overnight with overriding fast activity, worrisome for an ongoing focal epileptogenic disturbance. --One time Versed 69m. Please push button at the time to ensure that EEG is read looking for response to benzo.   We will continue to follow  Plan communicated to Dr. GPearline Cables PCCM.   03/18/2018, 9:33 AM  Attending Neurohospitalist Addendum Patient seen and examined with APP/Resident. Agree with the history and physical as documented above. Agree with the plan as documented, which I helped formulate. I have independently reviewed the chart, obtained history, review of systems and examined the patient.I have personally reviewed pertinent head/neck/spine imaging (CT/MRI). Please feel free to call with any questions. --- AAmie Portland MD [facility] Pager: [phone] If 7pm to 7am, please call on call as listed on [facility].  CRITICAL CARE ATTESTATION This patient is critically ill and at significant risk of neurological worsening, death and care requires constant monitoring of vital  signs, hemodynamics,respiratory and cardiac monitoring. I spent 30  minutes of neurocritical care time performing neurological assessment, discussion with family, other specialists and medical decision making of high complexityin the care of   this patient.

## 2018-03-18 NOTE — Progress Notes (Signed)
PULMONARY / CRITICAL CARE MEDICINE   Name: [name] MRN: [id number] DOB: 1945/09/21    ADMISSION DATE:  03/24/2018 CONSULTATION DATE:  03/18/2018  REFERRING MD:  APED  CHIEF COMPLAINT: Seizures  HISTORY OF PRESENT ILLNESS:   72 year old woman with hypertension, prior CVA and seizure disorder brought in by EMS to [facility] ED as a code stroke with acute onset weakness on the right side, last seen normal 12:10 PM.  At baseline she has left-sided weakness and aphasia, per report. She was evaluated by tele neurology .  CT did not show any evidence of stroke or large vessel occlusion.  Right face twitching was noted which did not improve with Ativan and Keppra.  With the suspicion of nonconvulsive status, she was intubated and started on propofol and transferred to [facility]  No clinical seizure activity overnight. Deeply sedated and not interactive  PAST MEDICAL HISTORY :  She  has a past medical history of Aphasia, GI bleed (12/01/2016), Hypertension, Seizures ([place]), Stroke [facility]) (2009, 2015 9/27, 11/2014), and Thyroid disease.  PAST SURGICAL HISTORY: She  has a past surgical history that includes PEG tube placement; PEG tube removal; Abdominal hysterectomy; Craniotomy (Left, 06/18/2015); and Colonoscopy (N/A, 12/03/2016).  Allergies  Allergen Reactions  . Codeine Nausea And Vomiting and Rash    No current facility-administered medications on file prior to encounter.    Current Outpatient Medications on File Prior to Encounter  Medication Sig  . acetaminophen (TYLENOL) 325 MG tablet Take 650 mg by mouth every 6 (six) hours as needed.  Marland Kitchen amLODipine (NORVASC) 5 MG tablet Take 5 mg by mouth daily.   Marland Kitchen aspirin EC 81 MG EC tablet Take 2 tablets (162 mg total) by mouth every evening.  Marland Kitchen atorvastatin (LIPITOR) 40 MG tablet Take 40 mg by mouth every evening.   . cholecalciferol (VITAMIN D) 400 units TABS tablet Take 400 Units by mouth daily.  . cloNIDine (CATAPRES) 0.1 MG tablet Take  0.1 mg by mouth daily.  Marland Kitchen levothyroxine (SYNTHROID, LEVOTHROID) 25 MCG tablet Take 25 mcg by mouth daily before breakfast.  . PHENobarbital (LUMINAL) 97.2 MG tablet Take 1 tablet (97.2 mg total) by mouth at bedtime.  Marland Kitchen QUEtiapine (SEROQUEL) 50 MG tablet Take 50 mg by mouth at bedtime.   . valproic acid (DEPAKENE) 250 MG capsule Take 2 capsules (500 mg total) by mouth 3 (three) times daily.  Roseanne Kaufman Peru-Castor Oil (VENELEX) OINT Apply to sacrum and bilateral buttocks q shift for prevention and incontinence or erythema every shift  . docusate (COLACE) 50 MG/5ML liquid Take 100 mg by mouth daily.  Marland Kitchen LORazepam (ATIVAN) 0.5 MG tablet Take 0.25 mg by mouth every 6 (six) hours as needed for anxiety.     FAMILY HISTORY:  Her family history includes Colon cancer in her sister; Coronary artery disease in her unknown relative; Stroke in her father; Sudden death in her father; Ulcers in her mother.  SOCIAL HISTORY: She  reports that she has been smoking cigarettes. She has a 75.00 pack-year smoking history. She has never used smokeless tobacco. She reports that she does not drink alcohol or use drugs.  REVIEW OF SYSTEMS:   Unable to obtain since unresponsive  SUBJECTIVE:   Remains unresponsive to verbal stimulation. Continues to have intermittent facial twitching.  VITAL SIGNS: BP 119/79   Pulse 79   Temp 99 F (37.2 C) (Axillary)   Resp 19   Ht 5\' 4"  (1.626 m)   Wt 45.7 kg   SpO2  100%   BMI 17.29 kg/m   HEMODYNAMICS:  On no vasopressors.  VENTILATOR SETTINGS: Vent Mode: PRVC FiO2 (%):  [30 %] 30 % Set Rate:  [15 bmp] 15 bmp Vt Set:  [430 mL] 430 mL PEEP:  [5 cmH20] 5 cmH20 Plateau Pressure:  [14 cmH20-20 cmH20] 15 cmH20  INTAKE / OUTPUT: I/O last 3 completed shifts: In: 2546.4 [I.V.:1173.4; NG/GT:832.3; IV Piggyback:540.8] Out: 0350 [Urine:1235; Emesis/NG output:100]  PHYSICAL EXAMINATION: General: Thin elderly female who is orally intubated and mechanically ventilated.   She is sedated on propofol at the time of my exam and not at all interactive.  Neuro: No response to voice or noxious stimuli.  Pupils are equal, face is symmetric.  Cardiovascular: No JVD, no edema.  S1 and S2 are regular without murmur rub or gallop.   Lungs: She is breathing above the set ventilator rate.  There is symmetric air movement, no wheezes, respirations are unlabored  Abdomen: The abdomen is soft without any organomegaly masses tenderness guarding or rebound  Musculoskeletal: Left forearm and hand contracture with some wasting Skin: No rash  LABS:  BMET Recent Labs  Lab 04/02/2018 1256 03/11/2018 1313 03/17/18 0631  NA 142 144 142  K 3.3* 3.6 4.2  CL 103 105 101  CO2 31  --  28  BUN 7* 4* 6*  CREATININE 0.58 0.60 0.63  GLUCOSE 105* 102* 90    Electrolytes Recent Labs  Lab 03/11/2018 1256  03/17/18 0631 03/17/18 1116 03/17/18 1653 03/18/18 0211  CALCIUM 9.1  --  8.9  --   --   --   MG  --    < > 1.7 1.6* 1.7 1.6*  PHOS  --    < > 3.5 3.2 3.5 3.6   < > = values in this interval not displayed.    CBC Recent Labs  Lab 03/28/2018 1256 03/23/2018 1313 03/17/18 0631  WBC 7.0  --  10.7*  HGB 13.4 13.6 12.8  HCT 39.1 40.0 37.3  PLT 224  --  221    Coag's Recent Labs  Lab 03/31/2018 1256  APTT 29  INR 1.06    Sepsis Markers No results for input(s): LATICACIDVEN, PROCALCITON, O2SATVEN in the last 168 hours.  ABG Recent Labs  Lab 04/02/2018 1625  PHART 7.434  PCO2ART 43.4  PO2ART 221*    Liver Enzymes Recent Labs  Lab 03/14/2018 1256  AST 31  ALT 23  ALKPHOS 51  BILITOT 0.4  ALBUMIN 3.3*    Cardiac Enzymes No results for input(s): TROPONINI, PROBNP in the last 168 hours.  Glucose Recent Labs  Lab 03/17/18 1615 03/17/18 2032 03/17/18 2141 03/17/18 2332 03/18/18 0331 03/18/18 0742  GLUCAP 116* 176* 115* 205* 114* 113*    Imaging No results found.   STUDIES:  CT head angio 8/10 >> diffuse atherosclerosis, chronic encephalomalacia, no  large vessel occlusion, right M2 narrowing  CULTURES:   ANTIBIOTICS:   SIGNIFICANT EVENTS: 8/10 tr from [facility]  LINES/TUBES: ETT 8/10 >>  DISCUSSION: Appears to be nonconvulsive status with facial twitching, now with propofol coma Unclear compliance with phenobarbital and valproic  ASSESSMENT / PLAN:  PULMONARY A: Acute respiratory failure due to inability to protect airway. P:   Continue mechanical ventilator support so long as she requires deep sedation.  I will be ordering a blood gas in the morning as I am concerned that her ventilatory requirements may be quite low and we may be over ventilating this patient.    CARDIOVASCULAR A:  Hypertension P:  Resume amlodipine and clonidine via OG tube Use hydralazine as needed  RENAL A:   Mild hypokalemia P:   Replete  GASTROINTESTINAL A:   No issues P:   Anticipate prolonged intubation.  High nutritional risk.  Initiate tube feeds. Protonix for stress ulcer prophylaxis  HEMATOLOGIC A:   No issues P:  Subcu Lovenox for DVT prophylaxis  INFECTIOUS A:   No issues P:     ENDOCRINE A:   No issues, at risk hypoglycemia P:   Initiate tube feeds continue CBGs.  NEUROLOGIC A:   Nonconvulsive status epilepticus P:   Continuous EEG monitoring is in place, she is having no clinical seizures.  Her phenobarbital level is high and we are allowing that to drop in the interim we have increased her Keppra and continue her on propofol.   Greater than 32 minutes was spent in the care of this patient today who is requiring continuous neurological monitoring and has a high potential for acute deterioration.  Lars Masson, MD    03/18/2018, 10:40 AM

## 2018-03-18 NOTE — Progress Notes (Signed)
MEDICATION RELATED CONSULT NOTE - INITIAL   Pharmacy Consult for Phenboarbital Indication: Seizures  Allergies  Allergen Reactions  . Codeine Nausea And Vomiting and Rash    Patient Measurements: Height: 5\' 4"  (162.6 cm) Weight: 100 lb 12 oz (45.7 kg) IBW/kg (Calculated) : 54.7  Vital Signs: Temp: 99 F (37.2 C) (08/12 0800) Temp Source: Axillary (08/12 0800) BP: 147/87 (08/12 0900) Pulse Rate: 70 (08/12 0900) Intake/Output from previous day: 08/11 0701 - 08/12 0700 In: 2255 [I.V.:1091.9; NG/GT:832.3; IV Piggyback:330.8] Out: 1000 [Urine:950; Emesis/NG output:50] Intake/Output from this shift: Total I/O In: 375.8 [I.V.:55.9; NG/GT:220; IV Piggyback:99.8] Out: 350 [Urine:350]  Labs: Recent Labs    04/01/2018 1256 03/17/2018 1313  03/17/18 0631 03/17/18 1116 03/17/18 1653 03/18/18 0211  WBC 7.0  --   --  10.7*  --   --   --   HGB 13.4 13.6  --  12.8  --   --   --   HCT 39.1 40.0  --  37.3  --   --   --   PLT 224  --   --  221  --   --   --   APTT 29  --   --   --   --   --   --   CREATININE 0.58 0.60  --  0.63  --   --   --   MG  --   --    < > 1.7 1.6* 1.7 1.6*  PHOS  --   --    < > 3.5 3.2 3.5 3.6  ALBUMIN 3.3*  --   --   --   --   --   --   PROT 7.9  --   --   --   --   --   --   AST 31  --   --   --   --   --   --   ALT 23  --   --   --   --   --   --   ALKPHOS 51  --   --   --   --   --   --   BILITOT 0.4  --   --   --   --   --   --    < > = values in this interval not displayed.   Estimated Creatinine Clearance: 47.2 mL/min (by C-G formula based on SCr of 0.63 mg/dL).   Microbiology: Recent Results (from the past 720 hour(s))  MRSA PCR Screening     Status: None   Collection Time: 03/29/2018  5:50 PM  Result Value Ref Range Status   MRSA by PCR NEGATIVE NEGATIVE Final    Comment:        The GeneXpert MRSA Assay (FDA approved for NASAL specimens only), is one component of a comprehensive MRSA colonization surveillance program. It is not intended  to diagnose MRSA infection nor to guide or monitor treatment for MRSA infections. Performed at [facility], [place] [address], [place], [place] [address]     Medical History: Past Medical History:  Diagnosis Date  . Aphasia   . GI bleed 12/01/2016  . Hypertension   . Seizures ([place])   . Stroke [facility]) 2009, 2015 9/27, 11/2014  . Thyroid disease     Assessment: 72 yo female being evaluated for seizures.  Patient has a prior history of seizure activity continued on home antiepileptic regimen of Phenobarbital 97.2 mg daily at  bedtime and valproic acid 500 mg three times daily. Since admission Lacosamide 200 mg IV q12hrs and Levetiracetam 1500 mg IV q12hrs have been added.  Phenobarbital level this am 39.2 mcg/ml (this is supratherapeutic based on [facility] lab's reference range on 15 - 30; however in reviewing the literature a range of 10 - 40 mcg/ml has been reported to control seizures in 84% of patients.)   Valproic acid level this am 80 mcg/ml (this is within the therapeutic range)  In reviewing the outpatient records the patient's outpatient phenobabital levels have ranged from 8.7 - 24.6 mcg/nl. Outpatient valproic acid levels 59 - 101 mcg/ml.  The only known drug interaction in this patient is between valproic acid and phenobarbital which can increase phenobarbital levels  and associated toxicities.  However, given that these medications are the patient's outpatient regimen this is likely not occurring acutely.  Given the patient's ongoing sedation and phenobarb level at the upper end of the therapeutic range and above outpatient levels, will decrease maintenance dose and recheck level in 5 days.  Plan:  Decrease Phenobarbital dose to 64.8 mg po qhs. Recheck phenobarbital level in 5 days.  Alanda Slim, PharmD, [facility] Clinical Pharmacist Please see AMION for all Pharmacists' Contact Phone Numbers 03/18/2018, 10:35 AM

## 2018-03-19 LAB — POCT I-STAT 3, ART BLOOD GAS (G3+)
Acid-Base Excess: 3 mmol/L — ABNORMAL HIGH (ref 0.0–2.0)
Bicarbonate: 27 mmol/L (ref 20.0–28.0)
O2 Saturation: 97 %
PCO2 ART: 38.2 mmHg (ref 32.0–48.0)
PH ART: 7.455 — AB (ref 7.350–7.450)
PO2 ART: 88 mmHg (ref 83.0–108.0)
Patient temperature: 97.4
TCO2: 28 mmol/L (ref 22–32)

## 2018-03-19 LAB — BLOOD GAS, ARTERIAL
Acid-Base Excess: 6.6 mmol/L — ABNORMAL HIGH (ref 0.0–2.0)
Bicarbonate: 30.1 mmol/L — ABNORMAL HIGH (ref 20.0–28.0)
DRAWN BY: 51155
FIO2: 30
O2 Saturation: 98.7 %
PCO2 ART: 39 mmHg (ref 32.0–48.0)
PEEP: 5 cmH2O
PO2 ART: 126 mmHg — AB (ref 83.0–108.0)
Patient temperature: 98.6
RATE: 15 resp/min
VT: 430 mL
pH, Arterial: 7.5 — ABNORMAL HIGH (ref 7.350–7.450)

## 2018-03-19 LAB — BASIC METABOLIC PANEL
ANION GAP: 12 (ref 5–15)
BUN: 9 mg/dL (ref 8–23)
CALCIUM: 8 mg/dL — AB (ref 8.9–10.3)
CO2: 28 mmol/L (ref 22–32)
Chloride: 102 mmol/L (ref 98–111)
Creatinine, Ser: 0.53 mg/dL (ref 0.44–1.00)
Glucose, Bld: 109 mg/dL — ABNORMAL HIGH (ref 70–99)
Potassium: 2.8 mmol/L — ABNORMAL LOW (ref 3.5–5.1)
SODIUM: 142 mmol/L (ref 135–145)

## 2018-03-19 LAB — GLUCOSE, CAPILLARY
GLUCOSE-CAPILLARY: 113 mg/dL — AB (ref 70–99)
GLUCOSE-CAPILLARY: 91 mg/dL (ref 70–99)
Glucose-Capillary: 115 mg/dL — ABNORMAL HIGH (ref 70–99)
Glucose-Capillary: 111 mg/dL — ABNORMAL HIGH (ref 70–99)
Glucose-Capillary: 90 mg/dL (ref 70–99)
Glucose-Capillary: 134 mg/dL — ABNORMAL HIGH (ref 70–99)

## 2018-03-19 LAB — TRIGLYCERIDES: Triglycerides: 53 mg/dL (ref <150)

## 2018-03-19 LAB — MAGNESIUM: MAGNESIUM: 2.1 mg/dL (ref 1.7–2.4)

## 2018-03-19 MED ORDER — MIDAZOLAM HCL 2 MG/2ML IJ SOLN: 4.0000 mg | Freq: Once | INTRAMUSCULAR | Status: DC

## 2018-03-19 MED ORDER — PHENOBARBITAL 32.4 MG PO TABS
32.4000 mg | ORAL_TABLET | Freq: Once | ORAL | Status: AC
  Administered 2018-03-19: 32.4 mg via ORAL
  Filled 2018-03-19: qty 1

## 2018-03-19 MED ORDER — POTASSIUM CHLORIDE 10 MEQ/100ML IV SOLN
10.0000 meq | INTRAVENOUS | Status: AC
  Administered 2018-03-19 (×6): 10 meq via INTRAVENOUS
  Filled 2018-03-19 (×3): qty 100

## 2018-03-19 MED ORDER — SODIUM CHLORIDE 0.9 % IV BOLUS
250.0000 mL | Freq: Once | INTRAVENOUS | Status: AC
  Administered 2018-03-19: 250 mL via INTRAVENOUS

## 2018-03-19 MED ORDER — PHENOBARBITAL 97.2 MG PO TABS
97.2000 mg | ORAL_TABLET | Freq: Every day | ORAL | Status: DC
  Administered 2018-03-19: 97.2 mg via ORAL
  Filled 2018-03-19: qty 1

## 2018-03-19 MED ORDER — LORAZEPAM 2 MG/ML IJ SOLN
1.0000 mg | INTRAMUSCULAR | Status: DC
  Administered 2018-03-19 – 2018-03-21 (×12): 1 mg via INTRAVENOUS
  Filled 2018-03-19 (×12): qty 1

## 2018-03-19 MED ORDER — MIDAZOLAM HCL 2 MG/2ML IJ SOLN
INTRAMUSCULAR | Status: AC
  Filled 2018-03-19: qty 6

## 2018-03-19 MED FILL — Fentanyl Citrate Preservative Free (PF) Inj 100 MCG/2ML: INTRAMUSCULAR | Qty: 2 | Status: AC

## 2018-03-19 NOTE — Progress Notes (Signed)
Neurology Progress Note   S:// Patient seen and examined.  Continues to be on LTM.  Needed higher doses of propofol overnight because of agitation. Long-term EEG shows left posterior LPD's with epileptiform morphology indicative of focal disturbance.  No elective graphic seizures were seen since 03/17/2018.  The LPD's have improved slightly with the increased AEDs but still have a very spike/polyspike morphology.  O:// Current vital signs: BP (!) 160/96   Pulse 70   Temp (!) 97.3 F (36.3 C) (Axillary)   Resp 15   Ht 5' 4"  (1.626 m)   Wt 46 kg   SpO2 100%   BMI 17.41 kg/m  Vital signs in last 24 hours: Temp:  [97.3 F (36.3 C)-98 F (36.7 C)] 97.3 F (36.3 C) (08/13 0800) Pulse Rate:  [56-80] 70 (08/13 0801) Resp:  [15-19] 15 (08/13 0801) BP: (90-173)/(66-108) 160/96 (08/13 0801) SpO2:  [100 %] 100 % (08/13 0801) FiO2 (%):  [30 %] 30 % (08/13 0801) Weight:  [46 kg] 46 kg (08/13 0500) General: Sedated intubated HEENT: Normocephalic atraumatic Extremities: Warm well perfused Neurological exam She is currently intubated and sedated on propofol. Minimal response to noxious stim elation excellent does not respond to voice Pupils are pinpoint, reactive. Initially had a rightward gaze that was forced and it spontaneously improved without benzos. Difficult to ascertain facial symmetry. No response to noxious edematous in any of the extremity.   Medications  Current Facility-Administered Medications:  .  0.9 %  sodium chloride infusion, 250 mL, Intravenous, PRN, Amie Portland, MD .  0.9 %  sodium chloride infusion, , Intravenous, Continuous, Amie Portland, MD, Stopped at 03/19/18 [phone] .  acetaminophen (TYLENOL) tablet 650 mg, 650 mg, Oral, Q4H PRN, Kara Mead V, MD .  albuterol (PROVENTIL) (2.5 MG/3ML) 0.083% nebulizer solution 2.5 mg, 2.5 mg, Nebulization, Q2H PRN, Rigoberto Noel, MD .  amLODipine (NORVASC) tablet 5 mg, 5 mg, Per Tube, Daily, Deterding, Guadelupe Sabin, MD, 5 mg  at 03/18/18 0913 .  aspirin chewable tablet 162 mg, 162 mg, Per Tube, QPM, Rigoberto Noel, MD, 162 mg at 03/18/18 1756 .  chlorhexidine gluconate (MEDLINE KIT) (PERIDEX) 0.12 % solution 15 mL, 15 mL, Mouth Rinse, BID, Kara Mead V, MD, 15 mL at 03/19/18 0800 .  cloNIDine (CATAPRES) tablet 0.1 mg, 0.1 mg, Per Tube, Daily, Deterding, Guadelupe Sabin, MD, 0.1 mg at 03/18/18 0913 .  enoxaparin (LOVENOX) injection 40 mg, 40 mg, Subcutaneous, Q24H, Rigoberto Noel, MD, 40 mg at 03/18/18 1757 .  feeding supplement (PRO-STAT SUGAR FREE 64) liquid 30 mL, 30 mL, Per Tube, Daily, Rigoberto Noel, MD, 30 mL at 03/18/18 0913 .  feeding supplement (VITAL 1.5 CAL) liquid 1,000 mL, 1,000 mL, Per Tube, Q24H, Rigoberto Noel, MD, Last Rate: 35 mL/hr at 03/18/18 1757, 1,000 mL at 03/18/18 1757 .  fentaNYL (SUBLIMAZE) injection 50 mcg, 50 mcg, Intravenous, Q15 min PRN, Rigoberto Noel, MD, 50 mcg at 03/18/18 2028 .  fentaNYL (SUBLIMAZE) injection 50 mcg, 50 mcg, Intravenous, Q2H PRN, Rigoberto Noel, MD, 50 mcg at 03/19/18 0507 .  free water 200 mL, 200 mL, Per Tube, Q6H, Rigoberto Noel, MD, 200 mL at 03/19/18 0433 .  insulin aspart (novoLOG) injection 0-9 Units, 0-9 Units, Subcutaneous, Q4H, Anders Simmonds, MD, 1 Units at 03/18/18 1648 .  lacosamide (VIMPAT) 200 mg in sodium chloride 0.9 % 25 mL IVPB, 200 mg, Intravenous, Q12H, Greta Doom, MD, Stopped at 03/18/18 2231 .  levETIRAcetam (KEPPRA) IVPB 1500  mg/ 100 mL premix, 1,500 mg, Intravenous, Q12H, Marliss Coots, PA-C, Last Rate: 400 mL/hr at 03/19/18 0837, 1,500 mg at 03/19/18 0837 .  levothyroxine (SYNTHROID, LEVOTHROID) tablet 25 mcg, 25 mcg, Per Tube, Daily, Rigoberto Noel, MD, 25 mcg at 03/19/18 0849 .  MEDLINE mouth rinse, 15 mL, Mouth Rinse, 10 times per day, Rigoberto Noel, MD, 15 mL at 03/19/18 [phone] .  midazolam (VERSED) injection 4 mg, 4 mg, Intravenous, Once, Amie Portland, MD, Stopped at 03/19/18 0813 .  pantoprazole sodium (PROTONIX) 40 mg/20 mL  oral suspension 40 mg, 40 mg, Per Tube, Daily, Rigoberto Noel, MD, 40 mg at 03/18/18 0912 .  PHENobarbital (LUMINAL) tablet 64.8 mg, 64.8 mg, Oral, QHS, Corinda Gubler, RPH, 64.8 mg at 03/18/18 2300 .  potassium chloride 10 mEq in 100 mL IVPB, 10 mEq, Intravenous, Q1 Hr x 6, Anders Simmonds, MD, Last Rate: 100 mL/hr at 03/19/18 0836, 10 mEq at 03/19/18 0836 .  propofol (DIPRIVAN) 1000 MG/100ML infusion, 0-50 mcg/kg/min, Intravenous, Continuous, Kara Mead V, MD, Last Rate: 11.1 mL/hr at 03/19/18 0800, 40 mcg/kg/min at 03/19/18 0800 .  valproate (DEPACON) 500 mg in dextrose 5 % 50 mL IVPB, 500 mg, Intravenous, Q8H, Greta Doom, MD, Last Rate: 55 mL/hr at 03/19/18 0800 Labs CBC    Component Value Date/Time   WBC 10.7 (H) 03/17/2018 0631   RBC 4.02 03/17/2018 0631   HGB 12.8 03/17/2018 0631   HCT 37.3 03/17/2018 0631   HCT 35.3 01/19/2016 2214   PLT 221 03/17/2018 0631   MCV 92.8 03/17/2018 0631   MCH 31.8 03/17/2018 0631   MCHC 34.3 03/17/2018 0631   RDW 13.9 03/17/2018 0631   LYMPHSABS 2.3 03/19/2018 1256   MONOABS 0.6 03/11/2018 1256   EOSABS 0.0 04/04/2018 1256   BASOSABS 0.0 03/19/2018 1256    CMP     Component Value Date/Time   NA 142 03/19/2018 0310   K 2.8 (L) 03/19/2018 0310   CL 102 03/19/2018 0310   CO2 28 03/19/2018 0310   GLUCOSE 109 (H) 03/19/2018 0310   BUN 9 03/19/2018 0310   CREATININE 0.53 03/19/2018 0310   CALCIUM 8.0 (L) 03/19/2018 0310   PROT 7.9 04/06/2018 1256   ALBUMIN 3.3 (L) 03/20/2018 1256   AST 31 03/19/2018 1256   ALT 23 03/19/2018 1256   ALKPHOS 51 03/24/2018 1256   BILITOT 0.4 03/25/2018 1256   GFRNONAA >60 03/19/2018 0310   GFRAA >60 03/19/2018 0310    Imaging I have reviewed images in epic and the results pertinent to this consultation are: CT-scan of the brain-on admission- 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  Left frontal craniotomy.  No residual or  recurrent meningioma. CT Angie of head and neck showed M2 narrowing and branch vessel atherosclerotic disease right greater than left without significant proximal stenosis or occlusion of the MCA.  Assessment:  72 year old with a history of seizure disorder, presented with focal status epilepticus.  She is currently on phenobarbital, Depakote, Vimpat and Keppra. Her continuous EEG continues to show focal epileptogenic city and has not shown a seizure since after the evening of 03/17/2018. Her examination this morning did reveal a forced rightward gaze with left polyspikes concerning for a clinical seizure event. Overnight EEG report as above. She would benefit from continuing her phenobarbital with a goal level of around 50 as well as reintroduction of her home benzos that she was on 0.25 Xanax every 4 hours  at home.  Impression: Focal status epilepticus  Recommendations: Continue Keppra 1500 twice daily Phenobarbital per pharmacy with a goal level of around 50. Ativan 1 mg every 4 hours standing dose Depakote 500 every 8 hours Vimpat 200 twice daily Maintain seizure precautions Correction of toxic metabolic derangements per primary team as you are.  Check and replete  lytes as needed.  -- Amie Portland, MD [facility] Pager: [phone] If 7pm to 7am, please call on call as listed on AMION.  CRITICAL CARE ATTESTATION This patient is critically ill and at significant risk of neurological worsening, death and care requires constant monitoring of vital signs, hemodynamics,respiratory and cardiac monitoring. I spent 30  minutes of neurocritical care time performing neurological assessment, discussion with family, other specialists and medical decision making of high complexityin the care of  this patient.

## 2018-03-19 NOTE — Progress Notes (Signed)
[place] Progress Note Patient Name: [name] DOB: 21-Nov-1945 MRN: [id number]   Date of Service  03/19/2018  HPI/Events of Note  Hypokalemia - K+ = 2.8 and Creatinine = 0.53.  eICU Interventions  Will replace K+.      Intervention Category Major Interventions: Electrolyte abnormality - evaluation and management  Sommer,Steven Eugene 03/19/2018, 5:49 AM

## 2018-03-19 NOTE — Procedures (Signed)
LTM-EEG Report  HISTORY: Continuous video-EEG monitoring performed for 72 year old patient with partial status epilepticus. ACQUISITION: International 10-20 system for electrode placement; 18 channels with additional eyes linked to ipsilateral ears and EKG. Additional T1-T2 electrodes were used. Continuous video recording obtained.   EEG NUMBER:  MEDICATIONS:  Day1: LEV, LCM, VPA, PHB Day 2: LEV, LCM, VPA, PHB  DAY #1: from 1158 03/17/18 to 0730 03/18/18 BACKGROUND: Anoverall medium voltage, continuous recording withsomespontaneous variability and reactivity. The backgroundconsisted of medium voltage, semirhythmic theta-delta activity bilaterally with sparse superimposed faster frequencies. As sedation was increased, there was slowing and attenuation of the background. Activity over the left posterior region was generally slower than the right. EPILEPTIFORM/PERIODIC ACTIVITY:There were frequent runs of left posterior (P3 maximal) lateralized periodic discharges (LPDs) with spike morphology and overriding fast activity, frequency 1-2Hz , with some discrete ictal evolution as below. The LPDs were slightly improved transient in the afternoon with increased AEDs, however returned and persisted over the late evening hours.  SEIZURES:One well-defined electrographic/subtle clinical seizure (1700pm) arising from left posterior LPDs with buildup of 8hz  ictal spiking in the left posterior then left hemispheric region over 90 seconds. Some subtle jaw twitching was seen with this event. EVENTS:Subtle clinical seizure with jaw twitching as above.   DAY #2: from 0730 03/18/18 to 0730 03/19/18 BACKGROUND: Anoverall medium voltage, continuous recording withsomespontaneous variability and reactivity. The backgroundconsisted of medium voltage, semirhythmic theta-delta activity bilaterally with sparse superimposed faster frequencies. As sedation was increased, there was slowing and attenuation of the  background. Activity over the left posterior region was generally slower than the right. EPILEPTIFORM/PERIODIC ACTIVITY:There were occasional runs of left posterior (P3 maximal) lateralized periodic discharges (LPDs) with spike morphology and overriding fast activity, frequency 1-2Hz , without definite ictal evolution. These were less abundant with increased AED treatment through the past 24 hours, however they still were seen with similar morphology. SEIZURES:none EVENTS:none  EKG: no significant arrhythmia  SUMMARY: This was an abnormal continuous video EEG due to left posterior LPDs with epileptiform morphology, indicative of a focal epileptogenic disturbance. No electrographic seizures were seen since 03/17/18. The LPDs have improved slightly with increased AEDs, however still have a spike/polyspike morphology.

## 2018-03-19 NOTE — Progress Notes (Signed)
LTM EEG checked, no skin breakdown seen at Fp1 or Fp2.

## 2018-03-19 NOTE — Progress Notes (Signed)
Upon assessment noted patient to have downward right gaze preference and otherwise unresponsive on the ventilator. Patient event button pressed on EEG and scheduled ativan given. Post ativan eyes returned midline.  Scheduled keppra given. Will continue to monitor.

## 2018-03-19 NOTE — Progress Notes (Signed)
PULMONARY / CRITICAL CARE MEDICINE   Name: [name] MRN: [id number] DOB: Dec 25, 1945    ADMISSION DATE:  04/01/2018 CONSULTATION DATE:  03/19/2018  REFERRING MD:  [facility]  CHIEF COMPLAINT: Seizures  HISTORY OF PRESENT ILLNESS:   72 year old woman with hypertension, prior CVA and seizure disorder brought in by EMS to [facility] ED as a code stroke with acute onset weakness on the right side, last seen normal 12:10 PM.  At baseline she has left-sided weakness and aphasia, per report. She was evaluated by tele neurology .  CT did not show any evidence of stroke or large vessel occlusion.  Right face twitching was noted which did not improve with Ativan and Keppra.  With the suspicion of nonconvulsive status, she was intubated and started on propofol and transferred to [facility]  Deeply sedated and not interactive. Continuous EEG shows continuous focal epileptogenic city and has not shown a seizure since after the evening of 03/17/2018. Her examination this morning did reveal a forced rightward gaze with left polyspikes concerning for a clinical seizure event.   SUBJECTIVE:  Deeply sedated, non-interactive, Transiently Hypotensive this am with sedation, suspect may be due to addition of ativan ( Re-introduction of home benzo) Remains intubated, no assist ventilations  Remains unresponsive to verbal stimulation. Continues to have intermittent facial twitching.  VITAL SIGNS: BP 139/84   Pulse 64   Temp (!) 97.3 F (36.3 C) (Axillary)   Resp 15   Ht 5\' 4"  (1.626 m)   Wt 46 kg   SpO2 100%   BMI 17.41 kg/m   HEMODYNAMICS:  On no vasopressors.  VENTILATOR SETTINGS: Vent Mode: PRVC FiO2 (%):  [30 %] 30 % Set Rate:  [15 bmp] 15 bmp Vt Set:  [430 mL] 430 mL PEEP:  [5 cmH20] 5 cmH20 Plateau Pressure:  [14 cmH20-19 cmH20] 17 cmH20  INTAKE / OUTPUT: I/O last 3 completed shifts: In: 3682.9 [I.V.:1586.5; NG/GT:1390; IV Piggyback:706.4] Out: 1600 [Urine:1550; Emesis/NG  output:50]  PHYSICAL EXAMINATION: General: Thin elderly female who is orally intubated and mechanically ventilated.  Heavily  sedated on propofol , receiving prn versed and ativan  Neuro: No response to voice or noxious stimuli. forced rightward gaze  concerning for a clinical seizure event. Pupils are equal, face is symmetric, with . Cardiovascular: No JVD, no edema.  S1 and S2 are regular without murmur rub or gallop.   Lungs: Bilateral excursion , Coarse, no wheezes, respirations are unlabored , Breathing at set vent rate of 15 Abdomen: The abdomen is soft without any organomegaly masses tenderness guarding or rebound  Musculoskeletal: Left forearm and hand contracture with some wasting, no obvious deformities Skin: No rash, lesions, warm, dry and intact  LABS:  BMET Recent Labs  Lab 03/23/2018 1256 03/15/2018 1313 03/17/18 0631 03/19/18 0310  NA 142 144 142 142  K 3.3* 3.6 4.2 2.8*  CL 103 105 101 102  CO2 31  --  28 28  BUN 7* 4* 6* 9  CREATININE 0.58 0.60 0.63 0.53  GLUCOSE 105* 102* 90 109*    Electrolytes Recent Labs  Lab 03/27/2018 1256 03/17/18 0631  03/17/18 1653 03/18/18 0211 03/18/18 1643 03/19/18 0310  CALCIUM 9.1 8.9  --   --   --   --  8.0*  MG  --  1.7   < > 1.7 1.6* 2.4 2.1  PHOS  --  3.5   < > 3.5 3.6 2.9  --    < > = values in this interval not  displayed.    CBC Recent Labs  Lab 03/20/2018 1256 03/09/2018 1313 03/17/18 0631  WBC 7.0  --  10.7*  HGB 13.4 13.6 12.8  HCT 39.1 40.0 37.3  PLT 224  --  221    Coag's Recent Labs  Lab 03/14/2018 1256  APTT 29  INR 1.06    Sepsis Markers No results for input(s): LATICACIDVEN, PROCALCITON, O2SATVEN in the last 168 hours.  ABG Recent Labs  Lab 03/13/2018 1625 03/19/18 0308  PHART 7.434 7.500*  PCO2ART 43.4 39.0  PO2ART 221* 126*    Liver Enzymes Recent Labs  Lab 03/15/2018 1256  AST 31  ALT 23  ALKPHOS 51  BILITOT 0.4  ALBUMIN 3.3*    Cardiac Enzymes No results for input(s): TROPONINI,  PROBNP in the last 168 hours.  Glucose Recent Labs  Lab 03/18/18 1141 03/18/18 1602 03/18/18 1937 03/18/18 2313 03/19/18 0318 03/19/18 0746  GLUCAP 110* 125* 120* 108* 115* 111*    Imaging No results found.   STUDIES:  CT head angio 8/10 >> diffuse atherosclerosis, chronic encephalomalacia, no large vessel occlusion, right M2 narrowing  CULTURES:   ANTIBIOTICS: None  SIGNIFICANT EVENTS: 8/10 tr from [facility]  LINES/TUBES: ETT 8/10 >>  DISCUSSION: Deeply sedated now with propofol coma, receiving versed prn and scheduled ativan  Unclear compliance with phenobarbital and valproic PTA  ASSESSMENT / PLAN:  PULMONARY A: Acute respiratory failure due to inability to protect airway. P:   Continue mechanical ventilator support so long as she requires deep sedation.  I will be ordering a blood gas in the morning as I am concerned that her ventilatory requirements may be quite low and we may be over ventilating this patient.  Plan: ABG's daily Consider decreasing rate to 12 >> May be over ventilating ABG in am 8/14 ABG 2 hours after decrease in rate Continue full support for now as patient continued to seize   CARDIOVASCULAR A:  Hypertension Hypotension 2/2 sedation Suspect due to addition of ativan + 3 L since admission P:  Tele 250 cc NS bolus now Consider decreasing Ativan dose to 0.5 mg Resume amlodipine and clonidine via OG tube>> hold for decreased BP   RENAL A:   hypokalemia  P:   Repleted per E Link ( 6 runs of 10 mEq) Trend BMET and Urine output Maintain renal perfusion, avoid nephrotoxic medications  GASTROINTESTINAL A:   No issues Tolerating TF P:   Anticipate prolonged intubation.  High nutritional risk.  Initiate tube feeds. Protonix for stress ulcer prophylaxis  HEMATOLOGIC A:   No issues P:  Subcu Lovenox for DVT prophylaxis Trend CBC Monitor for bleeding  INFECTIOUS A:   Slight increase in WBC Afebrile P:   Trend fever/  WBC Culture as is clinically indicated  ENDOCRINE A:   No issues, at risk hypoglycemia P:   Initiate tube feeds continue CBGs.  NEUROLOGIC A:   Nonconvulsive status epilepticus P:   Continuous EEG monitoring is in place. She continues to have clinical seizures  Phenobarbital level is high and we are allowing that to drop in the interim we have increased her Keppra and continue her on propofol. Additionally receiving Depakote, Vimpat Addition of Ativan may have caused interval hypotension Plan: Phenobarb level 8/14 Continue sedation and anti seizure regimen per neuro Consider decreasing ativan to 0.5 mg Q 4 if continues to drop BP Continuous EEG monitoring per neuro  No family members at bedside 8/13  30 Minutes APP time   Magdalen Spatz, AGACNP-BC [place]  Pulmonary/Critical Care Medicine Pager # [phone] 03/19/2018 9:59 AM     03/19/2018, 9:59 AM

## 2018-03-19 NOTE — Progress Notes (Signed)
Pt hypotensive to the 60s following administration of scheduled IV Ativan and PO cardiac meds. CCM NP at bedside, 285mL bolus of NS administered per verbal order. Propofol rate decreased from 40 to 20. Will continue to closely monitor.

## 2018-03-20 ENCOUNTER — Inpatient Hospital Stay (HOSPITAL_COMMUNITY): Payer: Medicare HMO

## 2018-03-20 LAB — MAGNESIUM: Magnesium: 2 mg/dL (ref 1.7–2.4)

## 2018-03-20 LAB — GLUCOSE, CAPILLARY
GLUCOSE-CAPILLARY: 114 mg/dL — AB (ref 70–99)
GLUCOSE-CAPILLARY: 106 mg/dL — AB (ref 70–99)
Glucose-Capillary: 100 mg/dL — ABNORMAL HIGH (ref 70–99)
Glucose-Capillary: 105 mg/dL — ABNORMAL HIGH (ref 70–99)
Glucose-Capillary: 118 mg/dL — ABNORMAL HIGH (ref 70–99)
Glucose-Capillary: 137 mg/dL — ABNORMAL HIGH (ref 70–99)

## 2018-03-20 LAB — BLOOD GAS, ARTERIAL
ACID-BASE EXCESS: 4.5 mmol/L — AB (ref 0.0–2.0)
BICARBONATE: 28.2 mmol/L — AB (ref 20.0–28.0)
Drawn by: 245131
FIO2: 30
LHR: 12 {breaths}/min
O2 Saturation: 97.5 %
PATIENT TEMPERATURE: 98.6
PCO2 ART: 39.7 mmHg (ref 32.0–48.0)
PEEP: 5 cmH2O
PH ART: 7.465 — AB (ref 7.350–7.450)
VT: 430 mL
pO2, Arterial: 98.3 mmHg (ref 83.0–108.0)

## 2018-03-20 LAB — CBC
HCT: 32.1 % — ABNORMAL LOW (ref 36.0–46.0)
Hemoglobin: 10.5 g/dL — ABNORMAL LOW (ref 12.0–15.0)
MCH: 31.7 pg (ref 26.0–34.0)
MCHC: 32.7 g/dL (ref 30.0–36.0)
MCV: 97 fL (ref 78.0–100.0)
Platelets: 182 10*3/uL (ref 150–400)
RBC: 3.31 MIL/uL — ABNORMAL LOW (ref 3.87–5.11)
RDW: 14.6 % (ref 11.5–15.5)
WBC: 9 10*3/uL (ref 4.0–10.5)

## 2018-03-20 LAB — BASIC METABOLIC PANEL
Anion gap: 9 (ref 5–15)
BUN: 10 mg/dL (ref 8–23)
CHLORIDE: 102 mmol/L (ref 98–111)
CO2: 27 mmol/L (ref 22–32)
CREATININE: 0.48 mg/dL (ref 0.44–1.00)
Calcium: 8.1 mg/dL — ABNORMAL LOW (ref 8.9–10.3)
GFR calc Af Amer: >60 mL/min (ref >60)
GFR calc non Af Amer: >60 mL/min (ref >60)
Glucose, Bld: 110 mg/dL — ABNORMAL HIGH (ref 70–99)
Potassium: 3.8 mmol/L (ref 3.5–5.1)
SODIUM: 138 mmol/L (ref 135–145)

## 2018-03-20 LAB — PHOSPHORUS: Phosphorus: 2.9 mg/dL (ref 2.5–4.6)

## 2018-03-20 LAB — PHENOBARBITAL LEVEL: PHENOBARBITAL: 34.5 ug/mL — AB (ref 15.0–30.0)

## 2018-03-20 MED ORDER — PHENOBARBITAL 20 MG/5ML PO ELIX
60.0000 mg | ORAL_SOLUTION | Freq: Two times a day (BID) | ORAL | Status: DC
  Administered 2018-03-20 – 2018-03-21 (×2): 60 mg
  Filled 2018-03-20 (×2): qty 15

## 2018-03-20 MED ORDER — SODIUM CHLORIDE 0.9 % IV SOLN
0.0000 mg/h | INTRAVENOUS | Status: DC
  Administered 2018-03-20: 7 mg/h via INTRAVENOUS
  Administered 2018-03-20: 2 mg/h via INTRAVENOUS
  Administered 2018-03-21: 10 mg/h via INTRAVENOUS
  Filled 2018-03-20 (×3): qty 10

## 2018-03-20 NOTE — Progress Notes (Signed)
Neurology Progress Note   S:// Seen and examined.  Possible clinical seizure last night.    O:// Current vital signs: BP 140/88   Pulse 75   Temp 98.7 F (37.1 C) (Axillary)   Resp 15   Ht _0  (1.626 m)   Wt 50.9 kg   SpO2 100%   BMI 19.26 kg/m  Vital signs in last 24 hours: Temp:  [96.5 F (35.8 C)-99.3 F (37.4 C)] 98.7 F (37.1 C) (08/14 0800) Pulse Rate:  [56-82] 75 (08/14 0900) Resp:  [13-24] 15 (08/14 0900) BP: (65-158)/(51-94) 140/88 (08/14 0900) SpO2:  [100 %] 100 % (08/14 0900) FiO2 (%):  [30 %] 30 % (08/14 0806) Weight:  [50.9 kg] 50.9 kg (08/14 0500) General: Sedated, intubated.  Sedation with propofol held for exam. HEENT: Normocephalic atraumatic, thickened tongue Extremities: Warm well perfused Neurological exam Currently intubated sedated on propofol which was held for exam. Pupils are equal round reactive sluggishly. She has a strong left corneal and a weak right corneal. She has a cough reflex but no gag. No gaze preference today. Difficult to ascertain facial symmetry with the endotracheal tube and tongue out. Localizes with upper extremities mildly to noxious stimulus. No response in the lower extremities.  Medications  Current Facility-Administered Medications:  .  0.9 %  sodium chloride infusion, 250 mL, Intravenous, PRN, Amie Portland, MD .  0.9 %  sodium chloride infusion, , Intravenous, Continuous, Amie Portland, MD, Last Rate: 50 mL/hr at 03/20/18 0900 .  acetaminophen (TYLENOL) tablet 650 mg, 650 mg, Oral, Q4H PRN, Kara Mead V, MD .  albuterol (PROVENTIL) (2.5 MG/3ML) 0.083% nebulizer solution 2.5 mg, 2.5 mg, Nebulization, Q2H PRN, Rigoberto Noel, MD .  amLODipine (NORVASC) tablet 5 mg, 5 mg, Per Tube, Daily, Deterding, Guadelupe Sabin, MD, 5 mg at 03/19/18 0908 .  aspirin chewable tablet 162 mg, 162 mg, Per Tube, QPM, Rigoberto Noel, MD, 162 mg at 03/19/18 1714 .  chlorhexidine gluconate (MEDLINE KIT) (PERIDEX) 0.12 % solution 15 mL, 15  mL, Mouth Rinse, BID, Kara Mead V, MD, 15 mL at 03/20/18 0800 .  cloNIDine (CATAPRES) tablet 0.1 mg, 0.1 mg, Per Tube, Daily, Deterding, Guadelupe Sabin, MD, 0.1 mg at 03/19/18 0908 .  enoxaparin (LOVENOX) injection 40 mg, 40 mg, Subcutaneous, Q24H, Rigoberto Noel, MD, 40 mg at 03/19/18 1715 .  feeding supplement (PRO-STAT SUGAR FREE 64) liquid 30 mL, 30 mL, Per Tube, Daily, Rigoberto Noel, MD, 30 mL at 03/19/18 0908 .  feeding supplement (VITAL 1.5 CAL) liquid 1,000 mL, 1,000 mL, Per Tube, Q24H, Rigoberto Noel, MD, Last Rate: 35 mL/hr at 03/19/18 1715, 1,000 mL at 03/19/18 1715 .  fentaNYL (SUBLIMAZE) injection 50 mcg, 50 mcg, Intravenous, Q15 min PRN, Rigoberto Noel, MD, 50 mcg at 03/18/18 2028 .  fentaNYL (SUBLIMAZE) injection 50 mcg, 50 mcg, Intravenous, Q2H PRN, Rigoberto Noel, MD, 50 mcg at 03/19/18 0507 .  free water 200 mL, 200 mL, Per Tube, Q6H, Rigoberto Noel, MD, 200 mL at 03/20/18 0504 .  insulin aspart (novoLOG) injection 0-9 Units, 0-9 Units, Subcutaneous, Q4H, Anders Simmonds, MD, 1 Units at 03/20/18 0003 .  lacosamide (VIMPAT) 200 mg in sodium chloride 0.9 % 25 mL IVPB, 200 mg, Intravenous, Q12H, Greta Doom, MD, Stopped at 03/19/18 2134 .  levETIRAcetam (KEPPRA) IVPB 1500 mg/ 100 mL premix, 1,500 mg, Intravenous, Q12H, Marliss Coots, PA-C, Stopped at 03/20/18 [phone] .  levothyroxine (SYNTHROID, LEVOTHROID) tablet 25 mcg, 25 mcg, Per  Tube, Daily, Rigoberto Noel, MD, 25 mcg at 03/19/18 [phone] .  LORazepam (ATIVAN) injection 1 mg, 1 mg, Intravenous, Q4H, Amie Portland, MD, 1 mg at 03/20/18 0829 .  MEDLINE mouth rinse, 15 mL, Mouth Rinse, 10 times per day, Rigoberto Noel, MD, 15 mL at 03/20/18 0552 .  midazolam (VERSED) injection 4 mg, 4 mg, Intravenous, Once, Amie Portland, MD, Stopped at 03/19/18 0813 .  pantoprazole sodium (PROTONIX) 40 mg/20 mL oral suspension 40 mg, 40 mg, Per Tube, Daily, Rigoberto Noel, MD, 40 mg at 03/19/18 0906 .  PHENobarbital (LUMINAL) tablet 97.2 mg,  97.2 mg, Oral, QHS, Amie Portland, MD, 97.2 mg at 03/19/18 2139 .  propofol (DIPRIVAN) 1000 MG/100ML infusion, 0-50 mcg/kg/min, Intravenous, Continuous, Kara Mead V, MD, Last Rate: 5.5 mL/hr at 03/20/18 0900, 20 mcg/kg/min at 03/20/18 0900 .  valproate (DEPACON) 500 mg in dextrose 5 % 50 mL IVPB, 500 mg, Intravenous, Q8H, Greta Doom, MD, Stopped at 03/20/18 0612 Labs CBC    Component Value Date/Time   WBC 9.0 03/20/2018 0253   RBC 3.31 (L) 03/20/2018 0253   HGB 10.5 (L) 03/20/2018 0253   HCT 32.1 (L) 03/20/2018 0253   HCT 35.3 01/19/2016 2214   PLT 182 03/20/2018 0253   MCV 97.0 03/20/2018 0253   MCH 31.7 03/20/2018 0253   MCHC 32.7 03/20/2018 0253   RDW 14.6 03/20/2018 0253   LYMPHSABS 2.3 03/11/2018 1256   MONOABS 0.6 03/15/2018 1256   EOSABS 0.0 03/07/2018 1256   BASOSABS 0.0 03/08/2018 1256    CMP     Component Value Date/Time   NA 138 03/20/2018 0253   K 3.8 03/20/2018 0253   CL 102 03/20/2018 0253   CO2 27 03/20/2018 0253   GLUCOSE 110 (H) 03/20/2018 0253   BUN 10 03/20/2018 0253   CREATININE 0.48 03/20/2018 0253   CALCIUM 8.1 (L) 03/20/2018 0253   PROT 7.9 03/30/2018 1256   ALBUMIN 3.3 (L) 03/07/2018 1256   AST 31 04/03/2018 1256   ALT 23 03/17/2018 1256   ALKPHOS 51 03/15/2018 1256   BILITOT 0.4 03/12/2018 1256   GFRNONAA >60 03/20/2018 0253   GFRAA >60 03/20/2018 0253   Overnight EEG: Abnormal continuous video EEG due to left posterior LPD's with epileptiform morphology indicative of focal epileptogenic disturbance.  The LPD's have shown continued improvement over the past 24 hours.  One brief event of unresponsiveness captured at 8 PM with increased LPD activity suggestive of brief elective graphic seizure.  Otherwise background is shown continued improvement.  Imaging I have reviewed images in epic and the results pertinent to this consultation are: Chronic encephalomalacia on brain CT scan in both parieto-occipital lobes as well as left  frontal operculum and left frontal craniotomy.  No residual or recurrent meningioma.   Assessment:  72 year old with a history of multiple strokes in the past with residual left-sided deficits, aphasia, seizures, hypertension, meningioma status post resection, brought into [facility] for status epilepticus. Currently on Keppra, phenobarbital, Depakote and Vimpat. Clinically not showing much improvement but EEG has shown subtle improvement. Nursing staff had conversation with family where the expressed their wishes to withdraw care as she has been pretty disabled at baseline and has expressed wishes to that effect.   Impression: Focal status epilepticus  Recommendations: Continue Keppra 1500 twice daily Phenobarbital per pharmacy with a goal level of around 50 Depakote 500 every 8 hours Vimpat 200 twice daily Ativan 1 mg every 4 hours standing dose as she is on home  benzos Maintain seizure precautions Continue on propofol at 10 mics an hour Correction of toxic metabolic derangements per primary team as you are. Would recommend getting a palliative care consult to address goals of care and CODE STATUS. Neurology will continue to follow with you.  -- Amie Portland, MD [facility] Pager: [phone] If 7pm to 7am, please call on call as listed on AMION.  CRITICAL CARE ATTESTATION This patient is critically ill and at significant risk of neurological worsening, death and care requires constant monitoring of vital signs, hemodynamics,respiratory and cardiac monitoring. I spent 30  minutes of neurocritical care time performing neurological assessment, discussion with family, other specialists and medical decision making of high complexityin the care of  this patient.

## 2018-03-20 NOTE — Procedures (Signed)
LTM-EEG Report  HISTORY: Continuous video-EEG monitoring performedfor 72 year old patient with partial status epilepticus. ACQUISITION: International 10-20 system for electrode placement; 18 channels with additional eyes linked to ipsilateral ears and EKG. Additional T1-T2 electrodes were used. Continuous video recording obtained.   EEG NUMBER:  MEDICATIONS:  Day 3: see EMR  DAY #3: from07308/13/19 to 0730 03/20/18 BACKGROUND: Anoverallmedium voltage, continuousrecording withsomespontaneous variability and reactivity. The backgroundconsisted of medium voltage, semirhythmic theta-delta activity bilaterally with some superimposed faster frequencies, improved over the past 24 hours. EPILEPTIFORM/PERIODIC ACTIVITY:There were rare runs of left posterior (P3 maximal) lateralized periodic discharges (LPDs) with spike morphology and overriding fast activity, frequency 1-2Hz , without definite ictal evolution. These were improved over the past 24 hours, and later were only seen as isolated spikes.  SEIZURES:There was one reported event of decreased responsiveness at 2000 without other clinical signs. There was an increase in runs of left posterior LPDs with this event. EVENTS:One possible electrographic seizure at 2000.  EKG: no significant arrhythmia  SUMMARY: This was anabnormal continuous video EEG due to left posterior LPDs with epileptiform morphology, indicative of a focal epileptogenic disturbance. The LPDs have shown continued improvement over the past 24 hours. One brief event of unresponsiveness was captured around 2000 with an increase in LPD activity, suggesting a brief electrographic seizure. Otherwise the background has shown continued improvement.

## 2018-03-20 NOTE — Progress Notes (Signed)
PULMONARY / CRITICAL CARE MEDICINE   Name: [name] MRN: [id number] DOB: 10/27/1945    ADMISSION DATE:  03/15/2018 CONSULTATION DATE:  03/20/2018  REFERRING MD:  [facility]  CHIEF COMPLAINT: Seizures  HISTORY OF PRESENT ILLNESS:   72 year old woman with hypertension, prior CVA and seizure disorder brought in by EMS to [facility] ED as a code stroke with acute onset weakness on the right side, last seen normal 12:10 PM.  At baseline she has left-sided weakness and aphasia, per report. She was evaluated by tele neurology .  CT did not show any evidence of stroke or large vessel occlusion.  Right face twitching was noted which did not improve with Ativan and Keppra.  With the suspicion of nonconvulsive status, she was intubated and started on propofol and transferred to [facility]  Deeply sedated and not interactive. Continuous EEG shows continuous focal epileptogenic city and has not shown a seizure since after the evening of 03/17/2018. Her examination this morning did reveal a forced rightward gaze with left polyspikes concerning for a clinical seizure event.   SUBJECTIVE:  She continues to be sedated orally intubated and mechanically ventilated.  I have received no report of clinical seizure activity overnight, the overnight EEG tracing is not yet been interpreted.    VITAL SIGNS: BP (!) 147/94   Pulse 73   Temp 98.6 F (37 C) (Axillary)   Resp 15   Ht 5\' 4"  (1.626 m)   Wt 50.9 kg   SpO2 100%   BMI 19.26 kg/m   HEMODYNAMICS:  On no vasopressors.  VENTILATOR SETTINGS: Vent Mode: PRVC FiO2 (%):  [30 %] 30 % Set Rate:  [12 bmp-15 bmp] 12 bmp Vt Set:  [430 mL] 430 mL PEEP:  [5 cmH20] 5 cmH20 Plateau Pressure:  [17 cmH20-18 cmH20] 17 cmH20  INTAKE / OUTPUT: I/O last 3 completed shifts: In: 5189.8 [I.V.:1802.8; NG/GT:2060; IV Piggyback:1327] Out: 6546 [Urine:1575]  PHYSICAL EXAMINATION: General: This is a very tiny thin elderly female who is orally intubated and  mechanically ventilated.  She is sedated and not at all interactive.    Neuro: No response to voice or noxious stimuli.  EOMs appear to be normal this morning.  Pupils are equal.  She is breathing above the set ventilator rate. Lungs: Respirations are unlabored, there is symmetric air movement, few scattered rhonchi, no wheezes.   Abdomen: The abdomen is soft without any organomegaly masses, tenderness, guarding, or rebound.   Musculoskeletal: There is some contracture at the left wrist  Skin: No rash, lesions, warm, dry and intact  LABS:  BMET Recent Labs  Lab 03/17/18 0631 03/19/18 0310 03/20/18 0253  NA 142 142 138  K 4.2 2.8* 3.8  CL 101 102 102  CO2 28 28 27   BUN 6* 9 10  CREATININE 0.63 0.53 0.48  GLUCOSE 90 109* 110*    Electrolytes Recent Labs  Lab 03/17/18 0631  03/18/18 0211 03/18/18 1643 03/19/18 0310 03/20/18 0253  CALCIUM 8.9  --   --   --  8.0* 8.1*  MG 1.7   < > 1.6* 2.4 2.1 2.0  PHOS 3.5   < > 3.6 2.9  --  2.9   < > = values in this interval not displayed.    CBC Recent Labs  Lab 03/11/2018 1256 04/03/2018 1313 03/17/18 0631 03/20/18 0253  WBC 7.0  --  10.7* 9.0  HGB 13.4 13.6 12.8 10.5*  HCT 39.1 40.0 37.3 32.1*  PLT 224  --  221  [place]  Lab 03/26/2018 1256  APTT 29  INR 1.06    Sepsis Markers No results for input(s): LATICACIDVEN, PROCALCITON, O2SATVEN in the last 168 hours.  ABG Recent Labs  Lab 03/19/18 0308 03/19/18 1514 03/20/18 0320  PHART 7.500* 7.455* 7.465*  PCO2ART 39.0 38.2 39.7  PO2ART 126* 88.0 98.3    Liver Enzymes Recent Labs  Lab 03/09/2018 1256  AST 31  ALT 23  ALKPHOS 51  BILITOT 0.4  ALBUMIN 3.3*    Cardiac Enzymes No results for input(s): TROPONINI, PROBNP in the last 168 hours.  Glucose Recent Labs  Lab 03/19/18 1133 03/19/18 1533 03/19/18 1928 03/19/18 2325 03/20/18 0314 03/20/18 0753  GLUCAP 90 113* 91 134* 100* 114*    Imaging No results found.   STUDIES:  CT  head angio 8/10 >> diffuse atherosclerosis, chronic encephalomalacia, no large vessel occlusion, right M2 narrowing  CULTURES:   ANTIBIOTICS: None  SIGNIFICANT EVENTS: 8/10 tr from [facility]  LINES/TUBES: ETT 8/10 >>  DISCUSSION: 72 year old with known seizure disorder who was admitted with partial complex status.  She is requiring multiple agents for control of her seizures including phenobarbital valproic acid and propofol.    ASSESSMENT / PLAN:  PULMONARY A: Acute respiratory failure due to inability to protect airway. P:   Continue mechanical ventilator support so long as she requires deep sedation.  I will be ordering a blood gas in the morning as I am concerned that her ventilatory requirements may be quite low and we may be over ventilating this patient.  Plan: Today's ABG is improved with a less pronounced alkalosis    CARDIOVASCULAR A:  History of hypertension Hypotension 2/2 sedation + 3 L since admission P:  D/C antihypertensives and use pressors as needed if increased sedatives are needed to control seizures   RENAL A:   No active issues  GASTROINTESTINAL A:   No issues Tolerating TF P:   Anticipate prolonged intubation.  High nutritional risk.  Initiate tube feeds. Protonix for stress ulcer prophylaxis  HEMATOLOGIC A:   No issues P:  Subcu Lovenox for DVT prophylaxis   INFECTIOUS A:   No active issues.    ENDOCRINE A:   No issues P:   Initiate tube feeds continue CBGs.  NEUROLOGIC A:   Nonconvulsive status epilepticus P:   Continuous EEG monitoring is in place, I'm awaiting the overnight interpretation from neurology. Phenobarbital level was high and we are allowing that to drop in the interim we have increased her Keppra and continue her on propofol. Additionally receiving Depakote, Vimpat, and a very modest dose of propofol Addition of Ativan may have caused interval hypotension Plan: Phenobarb level today is 34 Continue sedation  and anti seizure regimen per neuro Decrease antihypertensives if neurology feels an increase in  Sedating anti convulsants is indicated Continuous EEG monitoring per neuro  Lars Masson, MD Critical Care Medicine 03/20/2018 8:00 AM     03/20/2018, 8:00 AM

## 2018-03-20 NOTE — Progress Notes (Signed)
Instructed by Dr. Rory Percy to keep propofol on due to seizure activity on EEG. Currently running at 20 mcg/kg/min, okay per MD to decrease rate if hypotension occurs.

## 2018-03-20 NOTE — Progress Notes (Signed)
  MEDICATION RELATED CONSULT NOTE - INITIAL   Pharmacy Consult for AEDs Indication: Seizures  Allergies  Allergen Reactions  . Codeine Nausea And Vomiting and Rash    Assessment: 72 yo female being evaluated for seizures.  Patient has a prior history of seizure activity continued on home antiepileptic regimen of Phenobarbital 97.2 mg daily at bedtime and valproic acid 500 mg three times daily. Since admission Lacosamide 200 mg IV q12hrs and Levetiracetam 1500 mg IV q12hrs have been added.  VPA levels have been stable and therapeutic - so continue current dosage of 500 mg q8  Phenobarbital levels have been lower than goal range per Dr Rory Percy, neurology (goal is ~ 53)  Currently goal this am in 34.5 - patient has been receiving ~ 97.2 mg daily - was given 64.8 8/12 HS and 32.4 x 1 in am yesterday)  Renal fx has been stable  Plan:  Increase phenobarbital to 60 mg Q12h F/U repeat lvl at steady state - likely Fri/Sat  Levester Fresh, PharmD, BCPS, [place] Pharmacist [phone]  Please check AMION for all [place] numbers  03/20/2018 11:06 AM

## 2018-03-20 NOTE — Progress Notes (Signed)
LTM maintenance completed. No skin breakdown was seen. Reprepped O2 and Pz.

## 2018-03-20 NOTE — Progress Notes (Signed)
Speech Language Pathology Discharge Patient Details Name: [name] MRN: [id number] DOB: 11-Jun-1946 Today's Date: 03/20/2018 Time:  -     Patient discharged from SLP services secondary to medical decline - will need to re-order SLP to resume therapy services.  Please see latest therapy progress note for current level of functioning and progress toward goals.    Progress and discharge plan discussed with patient and/or caregiver: Patient unable to participate in discharge planning and no caregivers available  GO     Houston Siren 03/20/2018, 8:19 AM   Orbie Pyo Colvin Caroli.Ed [employer] [phone]

## 2018-03-21 LAB — CBC WITH DIFFERENTIAL/PLATELET
Abs Immature Granulocytes: 0 10*3/uL (ref 0.0–0.1)
Basophils Absolute: 0 10*3/uL (ref 0.0–0.1)
Basophils Relative: 0 %
EOS PCT: 2 %
Eosinophils Absolute: 0.1 10*3/uL (ref 0.0–0.7)
HEMATOCRIT: 28 % — AB (ref 36.0–46.0)
HEMOGLOBIN: 9.3 g/dL — AB (ref 12.0–15.0)
Immature Granulocytes: 1 %
LYMPHS ABS: 1 10*3/uL (ref 0.7–4.0)
LYMPHS PCT: 16 %
MCH: 32 pg (ref 26.0–34.0)
MCHC: 33.2 g/dL (ref 30.0–36.0)
MCV: 96.2 fL (ref 78.0–100.0)
MONOS PCT: 18 %
Monocytes Absolute: 1.2 10*3/uL — ABNORMAL HIGH (ref 0.1–1.0)
Neutro Abs: 4.3 10*3/uL (ref 1.7–7.7)
Neutrophils Relative %: 63 %
Platelets: 172 10*3/uL (ref 150–400)
RBC: 2.91 MIL/uL — AB (ref 3.87–5.11)
RDW: 14.6 % (ref 11.5–15.5)
WBC: 6.7 10*3/uL (ref 4.0–10.5)

## 2018-03-21 LAB — GLUCOSE, CAPILLARY
GLUCOSE-CAPILLARY: 127 mg/dL — AB (ref 70–99)
GLUCOSE-CAPILLARY: 107 mg/dL — AB (ref 70–99)
Glucose-Capillary: 107 mg/dL — ABNORMAL HIGH (ref 70–99)

## 2018-03-21 LAB — BASIC METABOLIC PANEL
ANION GAP: 8 (ref 5–15)
BUN: 9 mg/dL (ref 8–23)
CO2: 29 mmol/L (ref 22–32)
Calcium: 8.1 mg/dL — ABNORMAL LOW (ref 8.9–10.3)
Chloride: 103 mmol/L (ref 98–111)
Creatinine, Ser: 0.4 mg/dL — ABNORMAL LOW (ref 0.44–1.00)
GFR calc non Af Amer: >60 mL/min (ref >60)
GLUCOSE: 132 mg/dL — AB (ref 70–99)
Potassium: 3.3 mmol/L — ABNORMAL LOW (ref 3.5–5.1)
Sodium: 140 mmol/L (ref 135–145)

## 2018-03-21 LAB — MAGNESIUM: Magnesium: 2.1 mg/dL (ref 1.7–2.4)

## 2018-03-21 MED ORDER — LORAZEPAM 2 MG/ML IJ SOLN
1.0000 mg | INTRAMUSCULAR | Status: DC
  Administered 2018-03-21: 1 mg via INTRAVENOUS
  Filled 2018-03-21: qty 1

## 2018-03-21 MED ORDER — MORPHINE 100MG IN NS 100ML (1MG/ML) PREMIX INFUSION
3.0000 mg/h | INTRAVENOUS | Status: DC
  Administered 2018-03-21: 3 mg/h via INTRAVENOUS
  Filled 2018-03-21: qty 100

## 2018-03-25 ENCOUNTER — Telehealth: Payer: Self-pay

## 2018-03-25 NOTE — Telephone Encounter (Signed)
On 03/25/18, a D/C was received from [facility]. The D/C is for a burial. The Patient is a patient of Dr. Marolyn Hammock. The D/C will be taken to Dr. Halford Chessman [facility] at [facility] for Signature.   On 03/26/18 I received the D/C back from Dr. Halford Chessman. I got the signed D/C ready and called the funeral home to let them know the D/C is ready for pickup.

## 2018-04-07 NOTE — Procedures (Signed)
LTM-EEG Report  HISTORY: Continuous video-EEG monitoring performedfor 72 year old patient with partial status epilepticus. ACQUISITION: International 10-20 system for electrode placement; 18 channels with additional eyes linked to ipsilateral ears and EKG. Additional T1-T2 electrodes were used. Continuous video recording obtained.   EEG NUMBER:  MEDICATIONS:  Day 4: see EMR  DAY #4: from07308/14/19 to 0730 2018/03/29 BACKGROUND: Anoverallmedium voltage, continuousrecording withsomespontaneous variability and reactivity. The backgroundconsisted of medium voltage, semirhythmic theta-delta activity bilaterally with some superimposed faster frequencies initially. Overnight, there was increased background suppression.  EPILEPTIFORM/PERIODIC ACTIVITY:There were isolated left posterior (P3 maximal) spikes and sharp waves, rarely occurring in short runs of 1Hz  spikes. These showed no ictal evolution.  SEIZURES:none EVENTS:none  EKG: no significant arrhythmia  SUMMARY: This was anabnormal continuous video EEG due to left posterior epileptiform discharges, improved over the past 24 hours. There was increased background suppression overnight. No seizures were seen.

## 2018-04-07 NOTE — Progress Notes (Signed)
LTM EEG checked, no skin breakdown seen at Fp1 or Fp2

## 2018-04-07 NOTE — Procedures (Signed)
Extubation Procedure Note  Patient Details:   Name: [name] DOB: Dec 18, 1945 MRN: [id number]   Airway Documentation:    Vent end date: April 04, 2018 Vent end time: 1542   Evaluation  O2 sats: [place] after extubation. Complications: No apparent complications Patient did tolerate procedure well. Bilateral Breath Sounds: Clear   No   Patient was terminally extubated to room air with RT & RN. Family was brought back into the room to be with the patient after extubation.   Claretta Fraise 04-04-2018, 3:42 PM

## 2018-04-07 NOTE — Progress Notes (Signed)
No heart or lung sounds auscultated, no pulse present. Time of death 40. Family at bedside. Lianne Bushy RN BSN

## 2018-04-07 NOTE — Progress Notes (Addendum)
PULMONARY / CRITICAL CARE MEDICINE   Name: [name] MRN: [id number] DOB: 1946/04/05    ADMISSION DATE:  04/02/2018 CONSULTATION DATE:  03-28-2018  REFERRING MD:  [facility]  CHIEF COMPLAINT: Seizures  HISTORY OF PRESENT ILLNESS:   72 year old woman with hypertension, prior CVA and seizure disorder brought in by EMS to [facility] ED as a code stroke with acute onset weakness on the right side, last seen normal 12:10 PM.  At baseline she has left-sided weakness and aphasia, per report. She was evaluated by tele neurology .  CT did not show any evidence of stroke or large vessel occlusion.  Right face twitching was noted which did not improve with Ativan and Keppra.  With the suspicion of nonconvulsive status, she was intubated and started on propofol and transferred to [place] drip added yesterday evening with better EEG control of seizures.   SUBJECTIVE:  Versed drip has been decreased from 10 mg/hr to 4 mg/hr this morning, and she is having some twitching motions of her right eye.  Remains intubated, mechanically ventilated, and not at all interactive.     VITAL SIGNS: BP 95/70   Pulse 61   Temp (!) 96 F (35.6 C) (Axillary)   Resp 12   Ht 5\' 4"  (1.626 m)   Wt 51 kg   SpO2 100%   BMI 19.30 kg/m   HEMODYNAMICS:  On no vasopressors.  VENTILATOR SETTINGS: Vent Mode: PRVC FiO2 (%):  [30 %] 30 % Set Rate:  [12 bmp] 12 bmp Vt Set:  [430 mL] 430 mL PEEP:  [5 cmH20] 5 cmH20 Plateau Pressure:  [15 cmH20-18 cmH20] 15 cmH20  INTAKE / OUTPUT: I/O last 3 completed shifts: In: 4727.2 [I.V.:1610.2; NG/GT:2380; IV NIOEVOJJK:093] Out: 8182 [Urine:1075]  PHYSICAL EXAMINATION: General: This is a very thin elderly female who is orally intubated and unresponsive     Neuro: She is showing no response to voice loud noise or noxious stimuli by my exam.  There is some twitching of the right eye, there is downward deviation of the right eye. Lungs: Respirations are unlabored,  there is symmetric air movement, few scattered rhonchi and no wheezes   Abdomen: Abdomen is soft without any organomegaly masses tenderness guarding or rebound    Musculoskeletal: There is some contracture at the left wrist  Skin: No rash, lesions, warm, dry and intact  LABS:  BMET Recent Labs  Lab 03/19/18 0310 03/20/18 0253 03/28/18 0319  NA 142 138 140  K 2.8* 3.8 3.3*  CL 102 102 103  CO2 28 27 29   BUN 9 10 9   CREATININE 0.53 0.48 0.40*  GLUCOSE 109* 110* 132*    Electrolytes Recent Labs  Lab 03/18/18 0211 03/18/18 1643 03/19/18 0310 03/20/18 0253 2018-03-28 0319  CALCIUM  --   --  8.0* 8.1* 8.1*  MG 1.6* 2.4 2.1 2.0 2.1  PHOS 3.6 2.9  --  2.9  --     CBC Recent Labs  Lab 03/17/18 0631 03/20/18 0253 03-28-2018 0319  WBC 10.7* 9.0 6.7  HGB 12.8 10.5* 9.3*  HCT 37.3 32.1* 28.0*  PLT 221 182 172    Coag's Recent Labs  Lab 03/31/2018 1256  APTT 29  INR 1.06    Sepsis Markers No results for input(s): LATICACIDVEN, PROCALCITON, O2SATVEN in the last 168 hours.  ABG Recent Labs  Lab 03/19/18 0308 03/19/18 1514 03/20/18 0320  PHART 7.500* 7.455* 7.465*  PCO2ART 39.0 38.2 39.7  PO2ART 126* 88.0 98.3  Liver Enzymes Recent Labs  Lab 03/24/2018 1256  AST 31  ALT 23  ALKPHOS 51  BILITOT 0.4  ALBUMIN 3.3*    Cardiac Enzymes No results for input(s): TROPONINI, PROBNP in the last 168 hours.  Glucose Recent Labs  Lab 03/20/18 1200 03/20/18 1535 03/20/18 1929 03/20/18 2341 Apr 20, 2018 0352 04/20/2018 0813  GLUCAP 105* 118* 106* 137* 127* 107*    Imaging No results found.   STUDIES:  CT head angio 8/10 >> diffuse atherosclerosis, chronic encephalomalacia, no large vessel occlusion, right M2 narrowing  CULTURES:   ANTIBIOTICS: None  SIGNIFICANT EVENTS: 8/10 tr from [facility]  LINES/TUBES: ETT 8/10 >>  DISCUSSION: 72 year old with known seizure disorder who was admitted with partial complex status.  She is requiring multiple agents for  control of her seizures including phenobarbital valproic acid and propofol.    ASSESSMENT / PLAN:  PULMONARY A: Respiratory failure.  She is intubated solely to protect her airway in the setting of deep sedation for seizure control.   Today's ABG is improved with a less pronounced alkalosis    CARDIOVASCULAR A:  History of hypertension Hypotension 2/2 sedation + 3 L since admission P:  I discontinued her clonidine considering her relative potential and while sedated.  seizures   RENAL A:   No active issues  GASTROINTESTINAL A:   No issues Tolerating TF P:   Anticipate prolonged intubation.  High nutritional risk.  Initiate tube feeds. Protonix for stress ulcer prophylaxis  HEMATOLOGIC A:   No issues P:  Subcu Lovenox for DVT prophylaxis   INFECTIOUS A:   No active issues.    ENDOCRINE A:   No issues P:   Initiate tube feeds continue CBGs.  NEUROLOGIC A:   Nonconvulsive status epilepticus P:   Despite the addition of continuous benzodiazepines she had EEG evidence of ongoing seizures overnight and as the benzodiazepines are decreased this morning we are witnessing clinical seizure activity.  She is already on 4 enteral anticonvulsants and despite this continues to seize.  Neurology feels that prognosis is extremely poor and will be discussing this with family later today, I await their input to design further interventions.  Lars Masson, MD Critical Care Medicine Apr 20, 2018 10:15 AM   I was able to speak with Mr. Dercole this afternoon and explained to him that when the patient is deeply sedated we are able to suppress her seizures but is soon as we decrease that sedation her seizures returned.  I have shared with him that it appears that we have a choice between a patient who is sedated to an unconscious state and one who is unsedated and seizing.  Mr. Wheat agrees that neither option is acceptable.  Dr.Arora has also spoken with him and expressed his opinion that  the prospects for meaningful neurological recovery are indeed grim.  In consideration of these findings Mr. Stockburger and is to sister-in-law's feel that it would be consistent with with his wife's wishes to withdraw aggressive care and provide comfort measures only.  Apr 20, 2018, 10:15 AM

## 2018-04-07 NOTE — Discharge Summary (Signed)
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  Mrs. Cliett presented to [facility] with altered mental status.  She was noted to have facial twitching and was felt to be suffering from seizures.  Given Ativan and Keppra and required intubation for airway protection.  CT scan and CTA did not show any new primary repeat CNS pathology.  She was transferred to this hospital for control of apparent status.  She was placed on 4 antiepileptics including Keppra, phenobarbital, Depakote, and Vimpat and despite all 4 of these agents her seizures were not controlled.  Her seizures could be suppressed when we placed her on propofol which she was not at all or active when on all 5 agents.  We explained the situation to her husband eluding her inability to control her seizures except with deep sedation.  It was his feeling that considering her baseline health that further aggressive supportive care would not be indicated.  Supportive measures were withdrawn and the patient expired at 17: 22 on 2018/04/06

## 2018-04-07 NOTE — Progress Notes (Addendum)
Neurology Progress Note   S:// Remains unchanged Overnight EEG shows left posterior epileptiform discharges that improved over the past 24 hours but background suppression is also increased.  No seizures are seen.   O:// Current vital signs: BP 94/68   Pulse 60   Temp (!) 97.4 F (36.3 C) (Axillary)   Resp 12   Ht 5' 4"  (1.626 m)   Wt 51 kg   SpO2 100%   BMI 19.30 kg/m  Vital signs in last 24 hours: Temp:  [97.4 F (36.3 C)-98.4 F (36.9 C)] 97.4 F (36.3 C) (08/15 0400) Pulse Rate:  [60-78] 60 (08/15 0742) Resp:  [12-20] 12 (08/15 0742) BP: (87-146)/(61-108) 94/68 (08/15 0742) SpO2:  [99 %-100 %] 100 % (08/15 0742) FiO2 (%):  [30 %] 30 % (08/15 0742) Weight:  [51 kg] 51 kg (08/15 0500) Exam grossly unchanged from yesterday General: Sedated, intubated.  Sedation with propofol held for exam. HEENT: Normocephalic atraumatic, thickened tongue Extremities: Warm well perfused Neurological exam Currently intubated sedated on propofol which was held for exam. Pupils are equal round reactive sluggishly. She has a strong left corneal and a weak right corneal. She has a cough reflex but no gag. No gaze preference today. Difficult to ascertain facial symmetry with the endotracheal tube and tongue out. Localizes with upper extremities mildly to noxious stimulus. No response in the lower extremities.  Medications  Current Facility-Administered Medications:  .  0.9 %  sodium chloride infusion, 250 mL, Intravenous, PRN, Amie Portland, MD .  0.9 %  sodium chloride infusion, , Intravenous, Continuous, Amie Portland, MD, Last Rate: 50 mL/hr at 30-Mar-2018 0700 .  acetaminophen (TYLENOL) tablet 650 mg, 650 mg, Oral, Q4H PRN, Kara Mead V, MD .  albuterol (PROVENTIL) (2.5 MG/3ML) 0.083% nebulizer solution 2.5 mg, 2.5 mg, Nebulization, Q2H PRN, Rigoberto Noel, MD .  amLODipine (NORVASC) tablet 5 mg, 5 mg, Per Tube, Daily, Deterding, Guadelupe Sabin, MD, 5 mg at 03/20/18 0944 .  aspirin  chewable tablet 162 mg, 162 mg, Per Tube, QPM, Rigoberto Noel, MD, 162 mg at 03/20/18 1717 .  chlorhexidine gluconate (MEDLINE KIT) (PERIDEX) 0.12 % solution 15 mL, 15 mL, Mouth Rinse, BID, Kara Mead V, MD, 15 mL at March 30, 2018 0800 .  enoxaparin (LOVENOX) injection 40 mg, 40 mg, Subcutaneous, Q24H, Rigoberto Noel, MD, 40 mg at 03/20/18 1706 .  feeding supplement (PRO-STAT SUGAR FREE 64) liquid 30 mL, 30 mL, Per Tube, Daily, Rigoberto Noel, MD, 30 mL at 03/20/18 0945 .  feeding supplement (VITAL 1.5 CAL) liquid 1,000 mL, 1,000 mL, Per Tube, Q24H, Rigoberto Noel, MD, Last Rate: 35 mL/hr at 03/20/18 1800, 1,000 mL at 03/20/18 1800 .  fentaNYL (SUBLIMAZE) injection 50 mcg, 50 mcg, Intravenous, Q15 min PRN, Rigoberto Noel, MD, 50 mcg at 03/18/18 2028 .  fentaNYL (SUBLIMAZE) injection 50 mcg, 50 mcg, Intravenous, Q2H PRN, Rigoberto Noel, MD, 50 mcg at 03/19/18 0507 .  free water 200 mL, 200 mL, Per Tube, Q6H, Rigoberto Noel, MD, 200 mL at Mar 30, 2018 0546 .  insulin aspart (novoLOG) injection 0-9 Units, 0-9 Units, Subcutaneous, Q4H, Anders Simmonds, MD, 1 Units at 2018/03/30 [phone] .  lacosamide (VIMPAT) 200 mg in sodium chloride 0.9 % 25 mL IVPB, 200 mg, Intravenous, Q12H, Greta Doom, MD, Stopped at 03/20/18 2136 .  levETIRAcetam (KEPPRA) IVPB 1500 mg/ 100 mL premix, 1,500 mg, Intravenous, Q12H, Marliss Coots, PA-C, Last Rate: 400 mL/hr at 2018-03-30 0812, 1,500 mg at 30-Mar-2018 7619 .  levothyroxine (SYNTHROID, LEVOTHROID) tablet 25 mcg, 25 mcg, Per Tube, Daily, Rigoberto Noel, MD, 25 mcg at 03/20/18 0944 .  LORazepam (ATIVAN) injection 1 mg, 1 mg, Intravenous, Q4H, Amie Portland, MD, 1 mg at March 25, 2018 0546 .  MEDLINE mouth rinse, 15 mL, Mouth Rinse, 10 times per day, Rigoberto Noel, MD, 15 mL at March 25, 2018 0547 .  midazolam (VERSED) 50 mg in sodium chloride 0.9 % 50 mL (1 mg/mL) infusion, 0-10 mg/hr, Intravenous, Continuous, Amie Portland, MD, Last Rate: 10 mL/hr at 2018/03/25 0700, 10 mg/hr at March 25, 2018  0700 .  midazolam (VERSED) injection 4 mg, 4 mg, Intravenous, Once, Amie Portland, MD, Stopped at 03/19/18 0813 .  pantoprazole sodium (PROTONIX) 40 mg/20 mL oral suspension 40 mg, 40 mg, Per Tube, Daily, Rigoberto Noel, MD, 40 mg at 03/20/18 0945 .  PHENObarbital 20 MG/5ML elixir 60 mg, 60 mg, Per Tube, BID, Wynell Balloon, RPH, 60 mg at 03/20/18 2103 .  propofol (DIPRIVAN) 1000 MG/100ML infusion, 0-50 mcg/kg/min, Intravenous, Continuous, Kara Mead V, MD, Last Rate: 5.5 mL/hr at Mar 25, 2018 0700, 20 mcg/kg/min at 03-25-18 0700 .  valproate (DEPACON) 500 mg in dextrose 5 % 50 mL IVPB, 500 mg, Intravenous, Q8H, Greta Doom, MD, Stopped at 03/25/2018 0647 Labs CBC    Component Value Date/Time   WBC 6.7 Mar 25, 2018 0319   RBC 2.91 (L) 2018-03-25 0319   HGB 9.3 (L) 2018-03-25 0319   HCT 28.0 (L) 2018-03-25 0319   HCT 35.3 01/19/2016 2214   PLT 172 03-25-2018 0319   MCV 96.2 03/25/2018 0319   MCH 32.0 25-Mar-2018 0319   MCHC 33.2 Mar 25, 2018 0319   RDW 14.6 2018-03-25 0319   LYMPHSABS 1.0 2018-03-25 0319   MONOABS 1.2 (H) March 25, 2018 0319   EOSABS 0.1 Mar 25, 2018 0319   BASOSABS 0.0 March 25, 2018 0319    CMP     Component Value Date/Time   NA 140 03/25/18 0319   K 3.3 (L) 2018/03/25 0319   CL 103 March 25, 2018 0319   CO2 29 03/25/2018 0319   GLUCOSE 132 (H) 03-25-18 0319   BUN 9 2018-03-25 0319   CREATININE 0.40 (L) 03/25/18 0319   CALCIUM 8.1 (L) 03-25-2018 0319   PROT 7.9 03/14/2018 1256   ALBUMIN 3.3 (L) 04/03/2018 1256   AST 31 03/27/2018 1256   ALT 23 04/01/2018 1256   ALKPHOS 51 03/30/2018 1256   BILITOT 0.4 03/19/2018 1256   GFRNONAA >60 Mar 25, 2018 0319   GFRAA >60 03/25/2018 0319   Imaging I have reviewed images in epic and the results pertinent to this consultation are: CT head with no acute changes.  CTA head and neck with no large vessel occlusion.  Overnight EEG This was anabnormal continuous video EEG due to left posterior epileptiform discharges,  improved over the past 24 hours. There was increased background suppression overnight. No seizures were seen.  Assessment:  72 year old woman with history of multiple strokes in the past with residual left-sided deficits and moderate debility, aphasia, seizures, hypertension, meningioma status post resection brought in as a transfer to [facility] for status epilepticus. Currently the patient is on Keppra, phenobarbital, Depakote and Vimpat. She is clinically not showing much improvement.  Her EEG has not shown any seizures over the past 24 to 48 hours but that is in the setting of severe background suppression with antiepileptics and sedation. Given her current presentation and past history, she does not have a good prognosis going forward in terms of neurologically meaningful recovery. I would like to discuss  this further with the family, update them in person and see what their goals of care for her are.  Impression: Focal status epilepticus  Recommendations: Continue Keppra 1500 twice daily Continue phenobarbital with a goal level of around 50 per pharmacy Depakote 500 every 8 hours Vimpat 200 mg twice daily On Versed 10/h.  And propofol 30/h Maintain seizure precautions. I would recommend gradually weaning her off of the Versed and the propofol over next 2-3 hours. Once weaned off of Versed, can start her on standing dose of Ativan. Continue LTM EEG Correct toxic metabolic derangements as you are Family meeting regarding goals of care discussion due to above said reasons of prognosis for poor quality of life in terms of neurologically meaningful recovery going forward. Plan relayed to the PCCM attending on the unit.  -- Amie Portland, MD [facility] Pager: [phone] If 7pm to 7am, please call on call as listed on AMION.  CRITICAL CARE ATTESTATION This patient is critically ill and at significant risk of neurological worsening, death and care requires  constant monitoring of vital signs, hemodynamics,respiratory and cardiac monitoring. I spent 30  minutes of neurocritical care time performing neurological assessment, discussion with family, other specialists and medical decision making of high complexityin the care of  this patient.

## 2018-04-07 NOTE — Progress Notes (Signed)
78ml of versed wasted in sink with Brooke H. RN , 90 ml of morphine wasted in sink with Lauren RN. Fransico Michael RN BSN.

## 2018-04-07 DEATH — deceased

## 2018-11-24 IMAGING — MR MR HEAD WO/W CM
9 of 13 series · 31 of 48 positions shown · IV contrast (multihance)
Comparison: 01/20/2016 and 10/01/2015

CLINICAL DATA: History of meningioma resection. Difficulty walking
for 2 months.

EXAM:
MRI HEAD WITHOUT AND WITH CONTRAST
TECHNIQUE: Multiplanar, multiecho pulse sequences of the brain and surrounding
structures were obtained without and with intravenous contrast.
CONTRAST:  10mL MULTIHANCE GADOBENATE DIMEGLUMINE 529 MG/ML IV SOLN

[Series 3: DWI · axial · 3.0mm · 0.71mm/px · z∈[-73,+88]mm · 5 of 56 slices shown (1 of 4)]
[im 1/56]
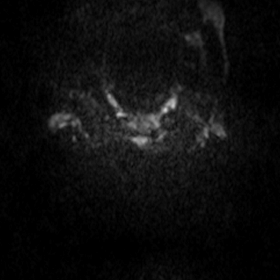
[im 14/56]
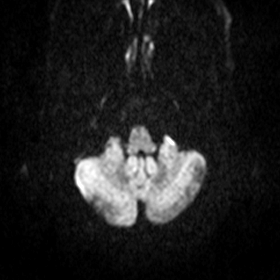
[im 28/56]
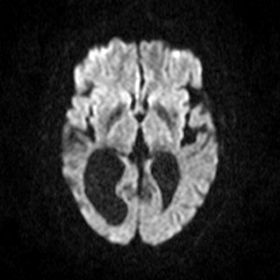
[im 42/56]
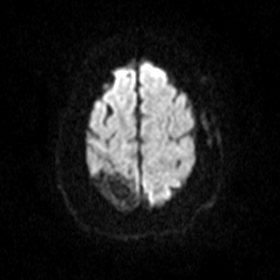
[im 56/56]
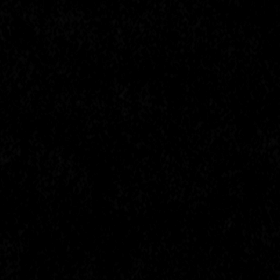

[Series 4: DWI · axial · 3.0mm · 0.72mm/px · z∈[-74,+82]mm · 5 of 53 slices shown (2 of 4)]
[im 1/53]
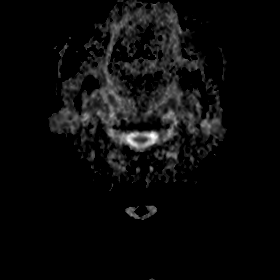
[im 14/53]
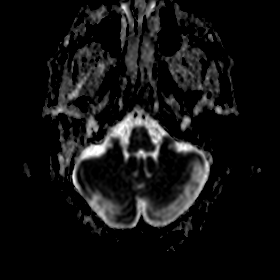
[im 27/53]
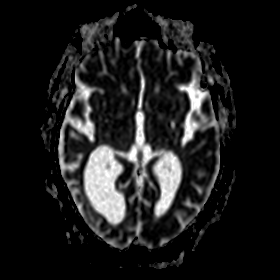
[im 40/53]
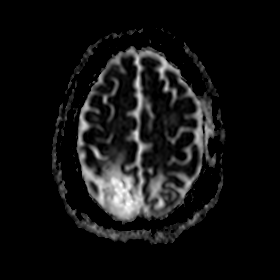
[im 53/53]
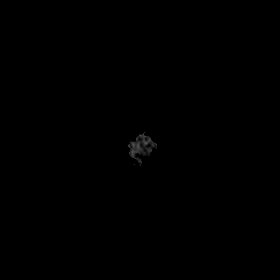

[Series 5: DWI · coronal · 5.0mm · 0.47mm/px · 3 of 34 slices shown (3 of 4)]
[im 1/34]
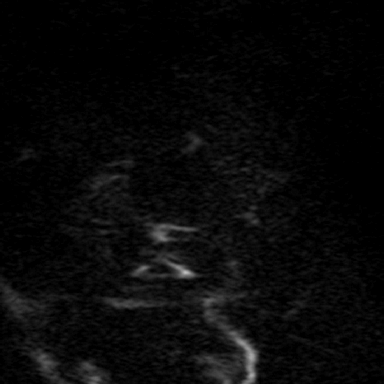
[im 17/34]
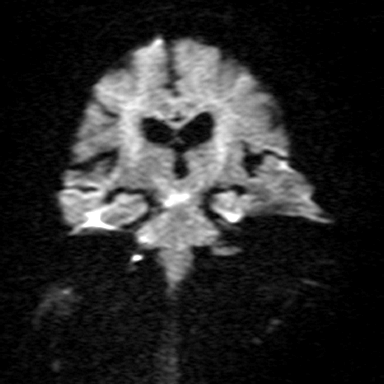
[im 34/34]
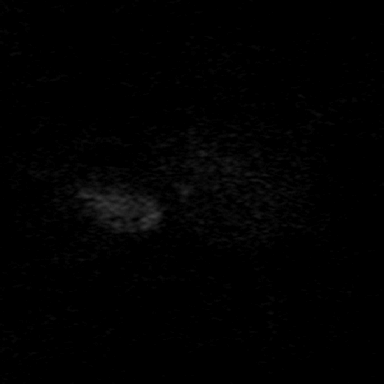

[Series 6: DWI · coronal · 5.0mm · 0.60mm/px · 3 of 34 slices shown (4 of 4)]
[im 1/34]
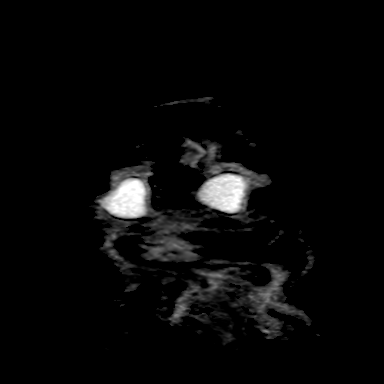
[im 17/34]
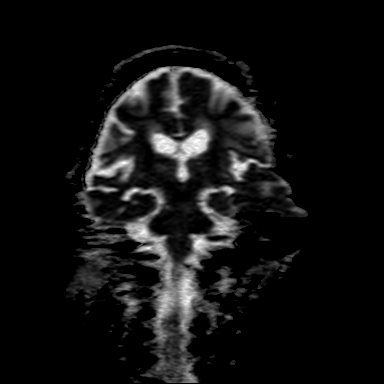
[im 34/34]
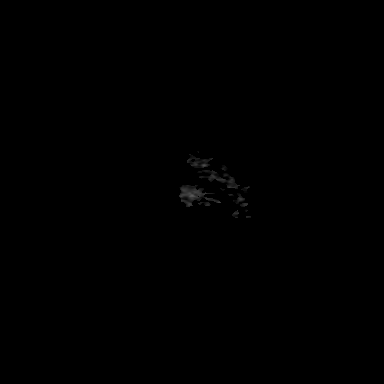

[Series 7: T2 · axial · 5.0mm · 0.48mm/px · 1 of 23 slices shown]
[im 1/23]
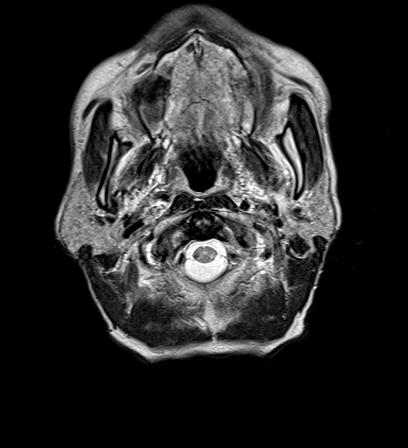

[Series 8: FLAIR · axial · 5.0mm · 0.81mm/px · z∈[-54,+89]mm · 2 of 23 slices shown]
[im 1/23]
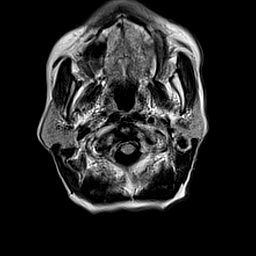
[im 23/23]
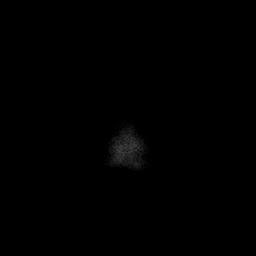

[Series 12: T1 post-contrast · axial · 2.0mm · 0.45mm/px · z∈[-86,+120]mm · 8 of 104 slices shown (1 of 3)]
[im 1/104]
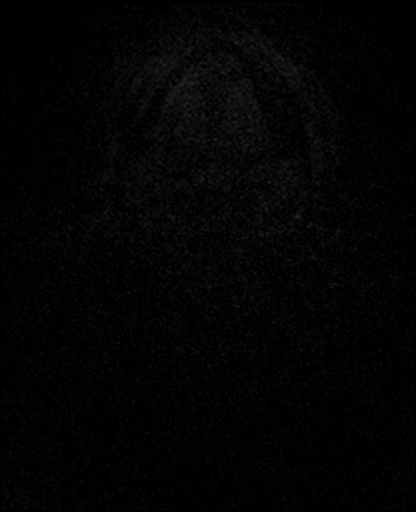
[im 13/104]
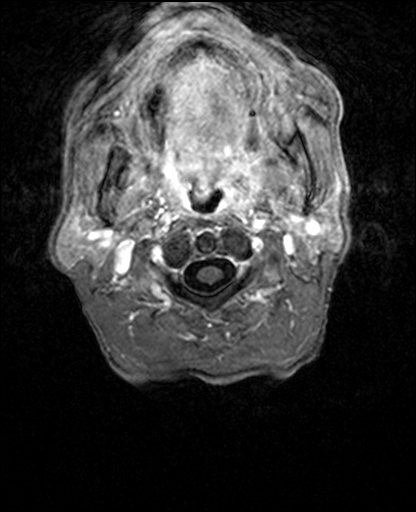
[im 26/104]
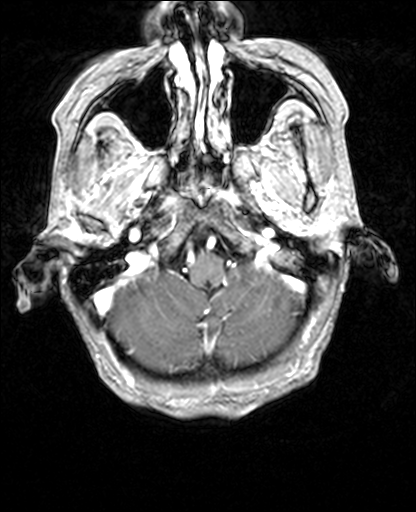
[im 39/104]
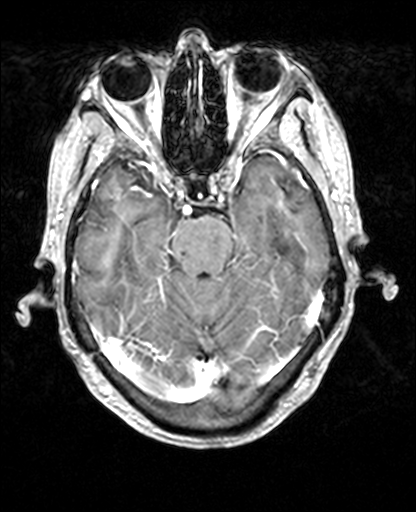
[im 65/104]
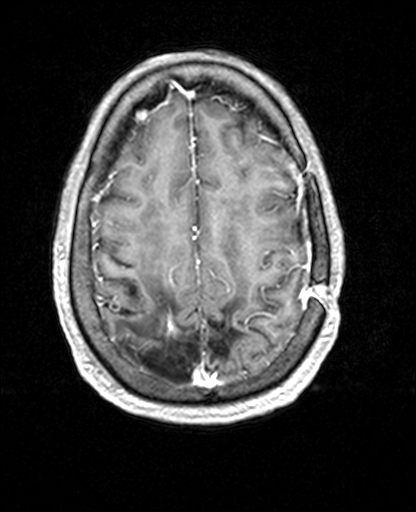
[im 78/104]
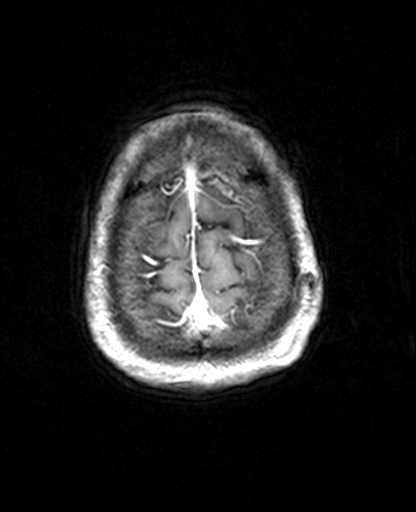
[im 91/104]
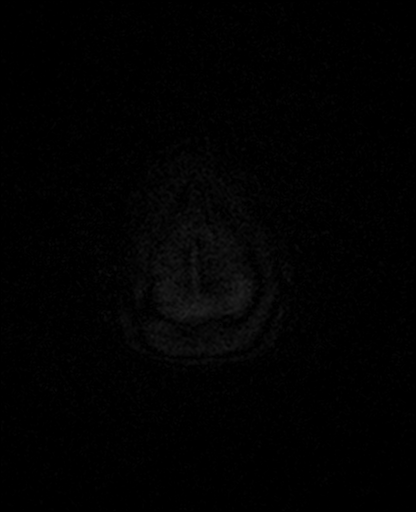
[im 104/104]
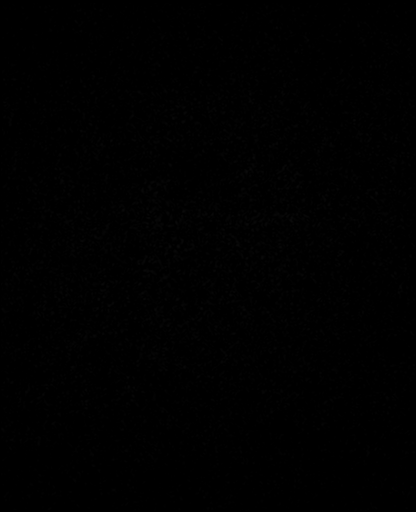

[Series 13: T1 post-contrast · coronal · 5.0mm · 0.38mm/px · 2 of 28 slices shown (2 of 3)]
[im 1/28]
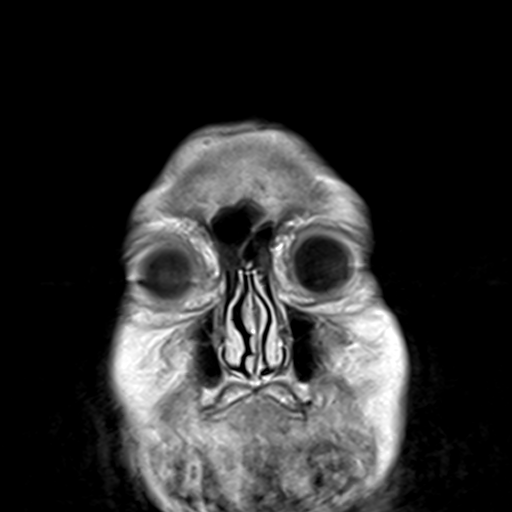
[im 28/28]
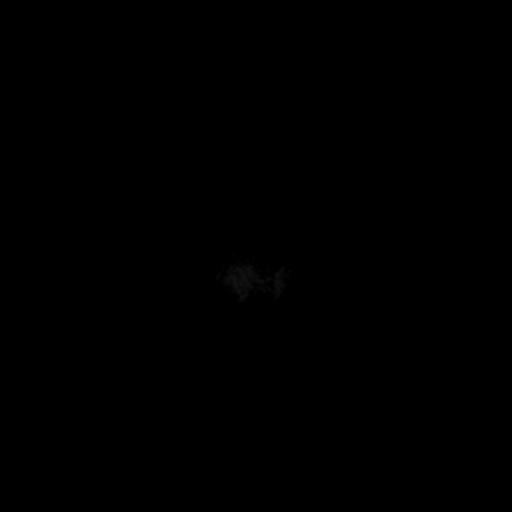

[Series 14: T1 post-contrast · sagittal · 5.0mm · 0.39mm/px · 2 of 20 slices shown (3 of 3)]
[im 1/20]
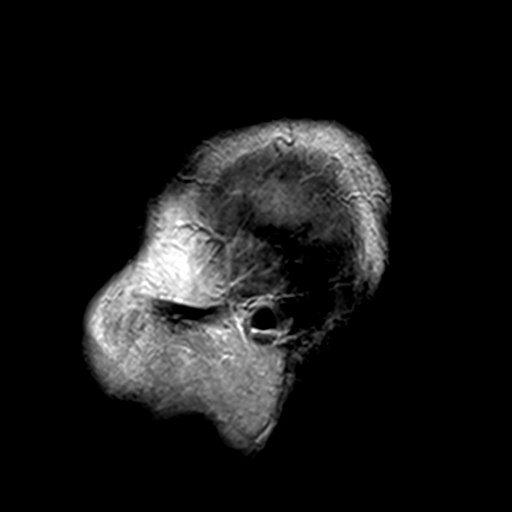
[im 20/20]
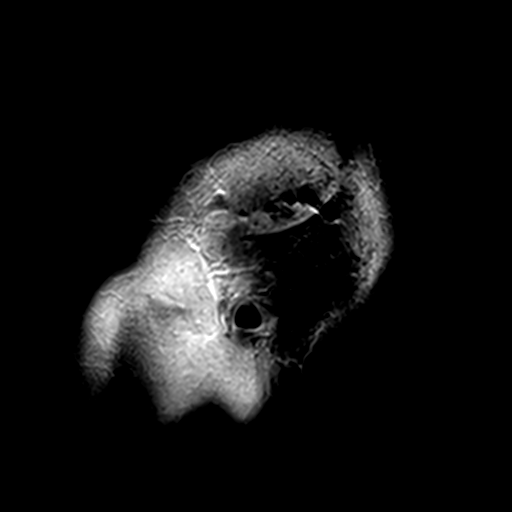

[31 of 48 positions shown; findings below may reference images not displayed]

FINDINGS: Brain: There is no evidence of acute infarct, midline shift, or
extra-axial fluid collection. Chronic infarcts are again seen with
cystic encephalomalacia involving the parietal lobes, moderate in
size on the right and small on the left. Chronic blood products are
again noted associated with both infarcts, and there is mild
partially curvilinear enhancement along the margins of both infarcts
which is unchanged from 10/01/2015 and benign in appearance. There
is ex vacuo enlargement of the right greater than left lateral
ventricles. Scattered chronic microhemorrhages are present in both
cerebral hemispheres and in the right pons. A chronic lacunar
infarct is again noted in the left corona radiata/ putamen. Patchy
T2 hyperintensities elsewhere in the cerebral white matter
bilaterally are unchanged and nonspecific but compatible with
chronic small vessel ischemic disease.

Sequelae of posterior left frontal craniotomy are again identified.
Smooth dural thickening subjacent to the craniotomy is similar to
the 10/01/2015 study and likely postoperative. No nodular or
masslike enhancement is seen to suggest recurrent tumor. Confluent
non-masslike T2 hyperintensity in the posterior left frontal white
matter at the level of the craniotomy is unchanged and may reflect
gliosis. Small focus of enhancement along the inner table of the
right frontal skull is unchanged and likely vascular.

Vascular: Major intracranial vascular flow voids are preserved.

Skull and upper cervical spine: Posterior left frontal craniotomy.
No suspicious osseous lesion.

Sinuses/Orbits: Unremarkable orbits. Clear paranasal sinuses.
Chronic right mastoid effusion.

Other: None.
IMPRESSION: 1. Postoperative changes from left frontal meningioma resection
without evidence of recurrent tumor.
2. Chronic ischemic changes as above including bilateral parietal
infarcts.
3. No acute intracranial abnormality.
